# Patient Record
Sex: Female | Born: 1955
Health system: Southern US, Community
[De-identification: ages and names within clinical notes are randomized; demographics above are authoritative.]

## PROBLEM LIST (undated history)

## (undated) DIAGNOSIS — K8020 Calculus of gallbladder without cholecystitis without obstruction: Secondary | ICD-10-CM

## (undated) DIAGNOSIS — F329 Major depressive disorder, single episode, unspecified: Secondary | ICD-10-CM

## (undated) DIAGNOSIS — M2041 Other hammer toe(s) (acquired), right foot: Secondary | ICD-10-CM

## (undated) DIAGNOSIS — M2042 Other hammer toe(s) (acquired), left foot: Secondary | ICD-10-CM

## (undated) DIAGNOSIS — K219 Gastro-esophageal reflux disease without esophagitis: Secondary | ICD-10-CM

## (undated) DIAGNOSIS — K5790 Diverticulosis of intestine, part unspecified, without perforation or abscess without bleeding: Secondary | ICD-10-CM

## (undated) DIAGNOSIS — M810 Age-related osteoporosis without current pathological fracture: Secondary | ICD-10-CM

## (undated) DIAGNOSIS — M21619 Bunion of unspecified foot: Secondary | ICD-10-CM

## (undated) DIAGNOSIS — H269 Unspecified cataract: Secondary | ICD-10-CM

## (undated) DIAGNOSIS — M1990 Unspecified osteoarthritis, unspecified site: Secondary | ICD-10-CM

## (undated) DIAGNOSIS — K279 Peptic ulcer, site unspecified, unspecified as acute or chronic, without hemorrhage or perforation: Secondary | ICD-10-CM

## (undated) DIAGNOSIS — E785 Hyperlipidemia, unspecified: Secondary | ICD-10-CM

## (undated) DIAGNOSIS — K5792 Diverticulitis of intestine, part unspecified, without perforation or abscess without bleeding: Secondary | ICD-10-CM

## (undated) DIAGNOSIS — M8580 Other specified disorders of bone density and structure, unspecified site: Secondary | ICD-10-CM

## (undated) DIAGNOSIS — K589 Irritable bowel syndrome without diarrhea: Secondary | ICD-10-CM

## (undated) DIAGNOSIS — F32A Depression, unspecified: Secondary | ICD-10-CM

## (undated) DIAGNOSIS — B351 Tinea unguium: Secondary | ICD-10-CM

## (undated) DIAGNOSIS — L900 Lichen sclerosus et atrophicus: Secondary | ICD-10-CM

## (undated) DIAGNOSIS — N281 Cyst of kidney, acquired: Secondary | ICD-10-CM

## (undated) DIAGNOSIS — S838X9A Sprain of other specified parts of unspecified knee, initial encounter: Secondary | ICD-10-CM

## (undated) HISTORY — DX: Gastro-esophageal reflux disease without esophagitis: K21.9

## (undated) HISTORY — DX: Peptic ulcer, site unspecified, unspecified as acute or chronic, without hemorrhage or perforation: K27.9

## (undated) HISTORY — DX: Cyst of kidney, acquired: N28.1

## (undated) HISTORY — DX: Other hammer toe(s) (acquired), right foot: M20.41

## (undated) HISTORY — DX: Calculus of gallbladder without cholecystitis without obstruction: K80.20

## (undated) HISTORY — DX: Diverticulitis of intestine, part unspecified, without perforation or abscess without bleeding: K57.92

## (undated) HISTORY — PX: COLONOSCOPY: SHX174

## (undated) HISTORY — DX: Major depressive disorder, single episode, unspecified: F32.9

## (undated) HISTORY — DX: Age-related osteoporosis without current pathological fracture: M81.0

## (undated) HISTORY — DX: Unspecified cataract: H26.9

## (undated) HISTORY — DX: Unspecified osteoarthritis, unspecified site: M19.90

## (undated) HISTORY — DX: Tinea unguium: B35.1

## (undated) HISTORY — PX: WISDOM TOOTH EXTRACTION: SHX21

## (undated) HISTORY — DX: Diverticulosis of intestine, part unspecified, without perforation or abscess without bleeding: K57.90

## (undated) HISTORY — DX: Lichen sclerosus et atrophicus: L90.0

## (undated) HISTORY — PX: UPPER GASTROINTESTINAL ENDOSCOPY: SHX188

## (undated) HISTORY — DX: Depression, unspecified: F32.A

## (undated) HISTORY — DX: Sprain of other specified parts of unspecified knee, initial encounter: S83.8X9A

## (undated) HISTORY — DX: Other hammer toe(s) (acquired), right foot: M20.42

## (undated) HISTORY — DX: Hyperlipidemia, unspecified: E78.5

## (undated) HISTORY — DX: Irritable bowel syndrome, unspecified: K58.9

## (undated) HISTORY — DX: Other specified disorders of bone density and structure, unspecified site: M85.80

## (undated) HISTORY — DX: Bunion of unspecified foot: M21.619

---

## 1979-01-20 DIAGNOSIS — K279 Peptic ulcer, site unspecified, unspecified as acute or chronic, without hemorrhage or perforation: Secondary | ICD-10-CM

## 1979-01-20 HISTORY — DX: Peptic ulcer, site unspecified, unspecified as acute or chronic, without hemorrhage or perforation: K27.9

## 1985-05-21 HISTORY — PX: CHOLECYSTECTOMY: SHX55

## 1998-02-01 ENCOUNTER — Other Ambulatory Visit: Admission: RE | Admit: 1998-02-01 | Discharge: 1998-02-01 | Payer: Self-pay | Admitting: Obstetrics and Gynecology

## 1998-06-20 ENCOUNTER — Ambulatory Visit (HOSPITAL_COMMUNITY): Admission: RE | Admit: 1998-06-20 | Discharge: 1998-06-20 | Payer: Self-pay | Admitting: Obstetrics and Gynecology

## 1998-06-20 ENCOUNTER — Encounter: Payer: Self-pay | Admitting: Obstetrics and Gynecology

## 1999-04-26 ENCOUNTER — Other Ambulatory Visit: Admission: RE | Admit: 1999-04-26 | Discharge: 1999-04-26 | Payer: Self-pay | Admitting: *Deleted

## 1999-06-26 ENCOUNTER — Encounter: Payer: Self-pay | Admitting: Obstetrics and Gynecology

## 1999-06-26 ENCOUNTER — Ambulatory Visit (HOSPITAL_COMMUNITY): Admission: RE | Admit: 1999-06-26 | Discharge: 1999-06-26 | Payer: Self-pay | Admitting: Obstetrics and Gynecology

## 2000-05-29 ENCOUNTER — Other Ambulatory Visit: Admission: RE | Admit: 2000-05-29 | Discharge: 2000-05-29 | Payer: Self-pay | Admitting: Obstetrics and Gynecology

## 2000-07-01 ENCOUNTER — Ambulatory Visit (HOSPITAL_COMMUNITY): Admission: RE | Admit: 2000-07-01 | Discharge: 2000-07-01 | Payer: Self-pay | Admitting: Obstetrics and Gynecology

## 2000-07-01 ENCOUNTER — Encounter: Payer: Self-pay | Admitting: Obstetrics and Gynecology

## 2001-07-02 ENCOUNTER — Encounter: Payer: Self-pay | Admitting: Obstetrics and Gynecology

## 2001-07-02 ENCOUNTER — Ambulatory Visit (HOSPITAL_COMMUNITY): Admission: RE | Admit: 2001-07-02 | Discharge: 2001-07-02 | Payer: Self-pay | Admitting: Obstetrics and Gynecology

## 2001-07-09 ENCOUNTER — Other Ambulatory Visit: Admission: RE | Admit: 2001-07-09 | Discharge: 2001-07-09 | Payer: Self-pay | Admitting: Obstetrics and Gynecology

## 2002-07-08 ENCOUNTER — Ambulatory Visit (HOSPITAL_COMMUNITY): Admission: RE | Admit: 2002-07-08 | Discharge: 2002-07-08 | Payer: Self-pay | Admitting: Obstetrics and Gynecology

## 2002-07-08 ENCOUNTER — Encounter: Payer: Self-pay | Admitting: Obstetrics and Gynecology

## 2002-07-28 DIAGNOSIS — K5730 Diverticulosis of large intestine without perforation or abscess without bleeding: Secondary | ICD-10-CM | POA: Insufficient documentation

## 2002-09-18 ENCOUNTER — Other Ambulatory Visit: Admission: RE | Admit: 2002-09-18 | Discharge: 2002-09-18 | Payer: Self-pay | Admitting: Obstetrics and Gynecology

## 2003-07-14 ENCOUNTER — Ambulatory Visit (HOSPITAL_COMMUNITY): Admission: RE | Admit: 2003-07-14 | Discharge: 2003-07-14 | Payer: Self-pay | Admitting: Obstetrics and Gynecology

## 2003-11-10 ENCOUNTER — Other Ambulatory Visit: Admission: RE | Admit: 2003-11-10 | Discharge: 2003-11-10 | Payer: Self-pay | Admitting: Obstetrics and Gynecology

## 2004-07-20 ENCOUNTER — Ambulatory Visit (HOSPITAL_COMMUNITY): Admission: RE | Admit: 2004-07-20 | Discharge: 2004-07-20 | Payer: Self-pay | Admitting: Obstetrics and Gynecology

## 2004-08-14 ENCOUNTER — Ambulatory Visit: Payer: Self-pay | Admitting: Internal Medicine

## 2004-09-04 ENCOUNTER — Ambulatory Visit: Payer: Self-pay | Admitting: Internal Medicine

## 2004-09-28 ENCOUNTER — Ambulatory Visit: Payer: Self-pay | Admitting: Internal Medicine

## 2004-11-15 ENCOUNTER — Other Ambulatory Visit: Admission: RE | Admit: 2004-11-15 | Discharge: 2004-11-15 | Payer: Self-pay | Admitting: Obstetrics and Gynecology

## 2004-12-21 ENCOUNTER — Ambulatory Visit: Payer: Self-pay | Admitting: Internal Medicine

## 2005-01-02 ENCOUNTER — Ambulatory Visit: Payer: Self-pay | Admitting: Internal Medicine

## 2005-06-27 ENCOUNTER — Ambulatory Visit: Payer: Self-pay | Admitting: Internal Medicine

## 2005-07-20 ENCOUNTER — Ambulatory Visit: Payer: Self-pay

## 2005-07-30 ENCOUNTER — Ambulatory Visit (HOSPITAL_COMMUNITY): Admission: RE | Admit: 2005-07-30 | Discharge: 2005-07-30 | Payer: Self-pay | Admitting: Obstetrics and Gynecology

## 2005-08-20 ENCOUNTER — Ambulatory Visit: Payer: Self-pay | Admitting: Internal Medicine

## 2005-11-26 ENCOUNTER — Other Ambulatory Visit: Admission: RE | Admit: 2005-11-26 | Discharge: 2005-11-26 | Payer: Self-pay | Admitting: Obstetrics & Gynecology

## 2005-11-30 ENCOUNTER — Encounter: Admission: RE | Admit: 2005-11-30 | Discharge: 2005-11-30 | Payer: Self-pay | Admitting: Obstetrics & Gynecology

## 2006-03-07 ENCOUNTER — Ambulatory Visit: Payer: Self-pay | Admitting: Family Medicine

## 2006-04-08 ENCOUNTER — Ambulatory Visit: Payer: Self-pay | Admitting: Family Medicine

## 2006-06-17 ENCOUNTER — Ambulatory Visit: Payer: Self-pay | Admitting: Internal Medicine

## 2006-07-22 ENCOUNTER — Ambulatory Visit: Payer: Self-pay | Admitting: Internal Medicine

## 2006-08-15 ENCOUNTER — Ambulatory Visit (HOSPITAL_COMMUNITY): Admission: RE | Admit: 2006-08-15 | Discharge: 2006-08-15 | Payer: Self-pay | Admitting: Obstetrics and Gynecology

## 2006-12-05 ENCOUNTER — Other Ambulatory Visit: Admission: RE | Admit: 2006-12-05 | Discharge: 2006-12-05 | Payer: Self-pay | Admitting: Obstetrics and Gynecology

## 2007-01-03 ENCOUNTER — Ambulatory Visit: Payer: Self-pay | Admitting: Internal Medicine

## 2007-01-03 LAB — CONVERTED CEMR LAB
ALT: 14 units/L (ref 0–35)
AST: 20 units/L (ref 0–37)
Albumin: 4 g/dL (ref 3.5–5.2)
Alkaline Phosphatase: 103 units/L (ref 39–117)
BUN: 11 mg/dL (ref 6–23)
Basophils Absolute: 0.1 10*3/uL (ref 0.0–0.1)
Basophils Relative: 1.3 % — ABNORMAL HIGH (ref 0.0–1.0)
Bilirubin Urine: NEGATIVE
Bilirubin, Direct: 0.1 mg/dL (ref 0.0–0.3)
Blood in Urine, dipstick: NEGATIVE
CO2: 33 meq/L — ABNORMAL HIGH (ref 19–32)
Calcium: 9.4 mg/dL (ref 8.4–10.5)
Chloride: 109 meq/L (ref 96–112)
Cholesterol: 265 mg/dL (ref 0–200)
Creatinine, Ser: 0.7 mg/dL (ref 0.4–1.2)
Direct LDL: 155.8 mg/dL
Eosinophils Absolute: 0.1 10*3/uL (ref 0.0–0.6)
Eosinophils Relative: 2.9 % (ref 0.0–5.0)
GFR calc Af Amer: 113 mL/min
GFR calc non Af Amer: 94 mL/min
Glucose, Bld: 93 mg/dL (ref 70–99)
Glucose, Urine, Semiquant: NEGATIVE
HCT: 38.4 % (ref 36.0–46.0)
HDL: 80.1 mg/dL (ref >39.0)
Hemoglobin: 13.2 g/dL (ref 12.0–15.0)
Lymphocytes Relative: 41.9 % (ref 12.0–46.0)
MCHC: 34.3 g/dL (ref 30.0–36.0)
MCV: 88.8 fL (ref 78.0–100.0)
Monocytes Absolute: 0.5 10*3/uL (ref 0.2–0.7)
Monocytes Relative: 11 % (ref 3.0–11.0)
Neutro Abs: 1.9 10*3/uL (ref 1.4–7.7)
Neutrophils Relative %: 42.9 % — ABNORMAL LOW (ref 43.0–77.0)
Nitrite: NEGATIVE
Platelets: 250 10*3/uL (ref 150–400)
Potassium: 3.9 meq/L (ref 3.5–5.1)
Protein, U semiquant: NEGATIVE
RBC: 4.33 M/uL (ref 3.87–5.11)
RDW: 12.7 % (ref 11.5–14.6)
Sodium: 146 meq/L — ABNORMAL HIGH (ref 135–145)
Specific Gravity, Urine: 1.02
TSH: 1.78 microintl units/mL (ref 0.35–5.50)
Total Bilirubin: 0.8 mg/dL (ref 0.3–1.2)
Total CHOL/HDL Ratio: 3.3
Total Protein: 7.1 g/dL (ref 6.0–8.3)
Triglycerides: 67 mg/dL (ref 0–149)
Urobilinogen, UA: 0.2
VLDL: 13 mg/dL (ref 0–40)
WBC Urine, dipstick: NEGATIVE
WBC: 4.4 10*3/uL — ABNORMAL LOW (ref 4.5–10.5)
pH: 5.5

## 2007-01-08 DIAGNOSIS — F329 Major depressive disorder, single episode, unspecified: Secondary | ICD-10-CM | POA: Insufficient documentation

## 2007-01-08 DIAGNOSIS — K219 Gastro-esophageal reflux disease without esophagitis: Secondary | ICD-10-CM | POA: Insufficient documentation

## 2007-01-10 ENCOUNTER — Ambulatory Visit: Payer: Self-pay | Admitting: Internal Medicine

## 2007-01-10 DIAGNOSIS — E785 Hyperlipidemia, unspecified: Secondary | ICD-10-CM | POA: Insufficient documentation

## 2007-03-05 ENCOUNTER — Ambulatory Visit: Payer: Self-pay | Admitting: Internal Medicine

## 2007-03-05 LAB — CONVERTED CEMR LAB: Vitamin B-12: 320 pg/mL (ref 211–911)

## 2007-03-26 ENCOUNTER — Ambulatory Visit: Payer: Self-pay | Admitting: Internal Medicine

## 2007-03-26 DIAGNOSIS — J069 Acute upper respiratory infection, unspecified: Secondary | ICD-10-CM | POA: Insufficient documentation

## 2007-03-26 DIAGNOSIS — J019 Acute sinusitis, unspecified: Secondary | ICD-10-CM | POA: Insufficient documentation

## 2007-04-02 ENCOUNTER — Ambulatory Visit: Payer: Self-pay | Admitting: Internal Medicine

## 2007-04-02 ENCOUNTER — Encounter: Payer: Self-pay | Admitting: Internal Medicine

## 2007-04-23 DIAGNOSIS — K589 Irritable bowel syndrome without diarrhea: Secondary | ICD-10-CM | POA: Insufficient documentation

## 2007-05-22 LAB — HM COLONOSCOPY

## 2007-08-21 ENCOUNTER — Ambulatory Visit (HOSPITAL_COMMUNITY): Admission: RE | Admit: 2007-08-21 | Discharge: 2007-08-21 | Payer: Self-pay | Admitting: Obstetrics and Gynecology

## 2007-09-04 ENCOUNTER — Encounter: Admission: RE | Admit: 2007-09-04 | Discharge: 2007-09-04 | Payer: Self-pay | Admitting: Obstetrics and Gynecology

## 2007-11-06 ENCOUNTER — Encounter: Payer: Self-pay | Admitting: Internal Medicine

## 2008-01-03 ENCOUNTER — Ambulatory Visit: Payer: Self-pay | Admitting: Family Medicine

## 2008-01-03 DIAGNOSIS — A088 Other specified intestinal infections: Secondary | ICD-10-CM | POA: Insufficient documentation

## 2008-01-07 ENCOUNTER — Telehealth: Payer: Self-pay | Admitting: Family Medicine

## 2008-01-13 ENCOUNTER — Ambulatory Visit: Payer: Self-pay | Admitting: Internal Medicine

## 2008-01-13 DIAGNOSIS — R197 Diarrhea, unspecified: Secondary | ICD-10-CM | POA: Insufficient documentation

## 2008-01-20 ENCOUNTER — Encounter: Payer: Self-pay | Admitting: Internal Medicine

## 2008-02-05 ENCOUNTER — Ambulatory Visit: Payer: Self-pay | Admitting: Internal Medicine

## 2008-02-19 ENCOUNTER — Telehealth: Payer: Self-pay | Admitting: Internal Medicine

## 2008-02-19 ENCOUNTER — Emergency Department (HOSPITAL_COMMUNITY): Admission: EM | Admit: 2008-02-19 | Discharge: 2008-02-19 | Payer: Self-pay | Admitting: Family Medicine

## 2008-02-20 ENCOUNTER — Ambulatory Visit: Payer: Self-pay | Admitting: Internal Medicine

## 2008-02-23 ENCOUNTER — Telehealth: Payer: Self-pay | Admitting: Internal Medicine

## 2008-03-18 ENCOUNTER — Other Ambulatory Visit: Admission: RE | Admit: 2008-03-18 | Discharge: 2008-03-18 | Payer: Self-pay | Admitting: Obstetrics and Gynecology

## 2008-04-20 LAB — CONVERTED CEMR LAB: Pap Smear: NORMAL

## 2008-06-21 ENCOUNTER — Telehealth: Payer: Self-pay | Admitting: Internal Medicine

## 2008-06-21 ENCOUNTER — Ambulatory Visit: Payer: Self-pay | Admitting: Internal Medicine

## 2008-06-21 DIAGNOSIS — H9209 Otalgia, unspecified ear: Secondary | ICD-10-CM | POA: Insufficient documentation

## 2008-06-21 DIAGNOSIS — R3915 Urgency of urination: Secondary | ICD-10-CM | POA: Insufficient documentation

## 2008-06-21 LAB — CONVERTED CEMR LAB
Bilirubin Urine: NEGATIVE
Blood in Urine, dipstick: NEGATIVE
Glucose, Urine, Semiquant: NEGATIVE
Ketones, urine, test strip: NEGATIVE
Nitrite: NEGATIVE
Protein, U semiquant: NEGATIVE
Specific Gravity, Urine: 1.015
Urobilinogen, UA: 0.2
WBC Urine, dipstick: NEGATIVE
pH: 5.5

## 2008-06-22 ENCOUNTER — Encounter: Payer: Self-pay | Admitting: Internal Medicine

## 2008-10-25 ENCOUNTER — Telehealth: Payer: Self-pay | Admitting: Internal Medicine

## 2008-10-29 ENCOUNTER — Ambulatory Visit: Payer: Self-pay | Admitting: Internal Medicine

## 2008-10-29 DIAGNOSIS — M5412 Radiculopathy, cervical region: Secondary | ICD-10-CM | POA: Insufficient documentation

## 2008-10-29 DIAGNOSIS — R110 Nausea: Secondary | ICD-10-CM | POA: Insufficient documentation

## 2008-11-04 ENCOUNTER — Ambulatory Visit (HOSPITAL_COMMUNITY): Admission: RE | Admit: 2008-11-04 | Discharge: 2008-11-04 | Payer: Self-pay | Admitting: Internal Medicine

## 2008-11-08 ENCOUNTER — Ambulatory Visit (HOSPITAL_COMMUNITY): Admission: RE | Admit: 2008-11-08 | Discharge: 2008-11-08 | Payer: Self-pay | Admitting: Obstetrics and Gynecology

## 2008-11-10 ENCOUNTER — Ambulatory Visit: Payer: Self-pay | Admitting: Gastroenterology

## 2008-11-10 DIAGNOSIS — M549 Dorsalgia, unspecified: Secondary | ICD-10-CM | POA: Insufficient documentation

## 2008-11-15 ENCOUNTER — Encounter: Admission: RE | Admit: 2008-11-15 | Discharge: 2008-11-15 | Payer: Self-pay | Admitting: Obstetrics and Gynecology

## 2008-11-29 ENCOUNTER — Ambulatory Visit: Payer: Self-pay | Admitting: Internal Medicine

## 2008-11-29 LAB — CONVERTED CEMR LAB
ALT: 18 units/L (ref 0–35)
AST: 23 units/L (ref 0–37)
Albumin: 3.9 g/dL (ref 3.5–5.2)
Alkaline Phosphatase: 108 units/L (ref 39–117)
BUN: 18 mg/dL (ref 6–23)
Basophils Absolute: 0.1 10*3/uL (ref 0.0–0.1)
Basophils Relative: 1.8 % (ref 0.0–3.0)
Bilirubin Urine: NEGATIVE
Bilirubin, Direct: 0 mg/dL (ref 0.0–0.3)
Blood in Urine, dipstick: NEGATIVE
CO2: 34 meq/L — ABNORMAL HIGH (ref 19–32)
Calcium: 9.5 mg/dL (ref 8.4–10.5)
Chloride: 106 meq/L (ref 96–112)
Cholesterol: 268 mg/dL — ABNORMAL HIGH (ref 0–200)
Creatinine, Ser: 0.8 mg/dL (ref 0.4–1.2)
Direct LDL: 164.7 mg/dL
Eosinophils Absolute: 0.1 10*3/uL (ref 0.0–0.7)
Eosinophils Relative: 3.4 % (ref 0.0–5.0)
GFR calc non Af Amer: 79.48 mL/min (ref >60)
Glucose, Bld: 96 mg/dL (ref 70–99)
Glucose, Urine, Semiquant: NEGATIVE
HCT: 38.5 % (ref 36.0–46.0)
HDL: 81.3 mg/dL (ref >39.00)
Hemoglobin: 12.8 g/dL (ref 12.0–15.0)
Ketones, urine, test strip: NEGATIVE
Lymphocytes Relative: 38.1 % (ref 12.0–46.0)
Lymphs Abs: 1.6 10*3/uL (ref 0.7–4.0)
MCHC: 33.3 g/dL (ref 30.0–36.0)
MCV: 89.6 fL (ref 78.0–100.0)
Monocytes Absolute: 0.5 10*3/uL (ref 0.1–1.0)
Monocytes Relative: 10.9 % (ref 3.0–12.0)
Neutro Abs: 1.9 10*3/uL (ref 1.4–7.7)
Neutrophils Relative %: 45.8 % (ref 43.0–77.0)
Nitrite: NEGATIVE
Platelets: 241 10*3/uL (ref 150.0–400.0)
Potassium: 5.1 meq/L (ref 3.5–5.1)
Protein, U semiquant: NEGATIVE
RBC: 4.29 M/uL (ref 3.87–5.11)
RDW: 13.1 % (ref 11.5–14.6)
Sodium: 147 meq/L — ABNORMAL HIGH (ref 135–145)
Specific Gravity, Urine: 1.015
TSH: 1.6 microintl units/mL (ref 0.35–5.50)
Total Bilirubin: 0.8 mg/dL (ref 0.3–1.2)
Total CHOL/HDL Ratio: 3
Total Protein: 7.4 g/dL (ref 6.0–8.3)
Triglycerides: 88 mg/dL (ref 0.0–149.0)
Urobilinogen, UA: 0.2
VLDL: 17.6 mg/dL (ref 0.0–40.0)
WBC Urine, dipstick: NEGATIVE
WBC: 4.2 10*3/uL — ABNORMAL LOW (ref 4.5–10.5)
pH: 5.5

## 2008-12-06 ENCOUNTER — Ambulatory Visit: Payer: Self-pay | Admitting: Internal Medicine

## 2008-12-06 DIAGNOSIS — M546 Pain in thoracic spine: Secondary | ICD-10-CM | POA: Insufficient documentation

## 2008-12-06 DIAGNOSIS — I83893 Varicose veins of bilateral lower extremities with other complications: Secondary | ICD-10-CM | POA: Insufficient documentation

## 2008-12-20 ENCOUNTER — Ambulatory Visit: Payer: Self-pay | Admitting: Internal Medicine

## 2009-04-08 ENCOUNTER — Encounter: Payer: Self-pay | Admitting: Nurse Practitioner

## 2009-04-08 ENCOUNTER — Telehealth: Payer: Self-pay | Admitting: Nurse Practitioner

## 2009-07-01 ENCOUNTER — Ambulatory Visit: Payer: Self-pay | Admitting: Internal Medicine

## 2009-07-01 DIAGNOSIS — M79609 Pain in unspecified limb: Secondary | ICD-10-CM | POA: Insufficient documentation

## 2009-09-07 ENCOUNTER — Encounter: Payer: Self-pay | Admitting: Internal Medicine

## 2009-10-04 ENCOUNTER — Encounter: Payer: Self-pay | Admitting: Internal Medicine

## 2009-10-19 LAB — HM DEXA SCAN

## 2009-11-10 ENCOUNTER — Encounter: Payer: Self-pay | Admitting: Internal Medicine

## 2009-11-10 ENCOUNTER — Encounter: Admission: RE | Admit: 2009-11-10 | Discharge: 2009-11-10 | Payer: Self-pay | Admitting: Internal Medicine

## 2010-02-02 ENCOUNTER — Ambulatory Visit: Payer: Self-pay | Admitting: Internal Medicine

## 2010-02-27 ENCOUNTER — Ambulatory Visit: Payer: Self-pay | Admitting: Family Medicine

## 2010-02-27 ENCOUNTER — Telehealth: Payer: Self-pay | Admitting: Internal Medicine

## 2010-02-27 LAB — CONVERTED CEMR LAB
Bilirubin Urine: NEGATIVE
Blood in Urine, dipstick: NEGATIVE
Glucose, Urine, Semiquant: NEGATIVE
Nitrite: NEGATIVE
Specific Gravity, Urine: 1.01
Urobilinogen, UA: 0.2
WBC Urine, dipstick: NEGATIVE
pH: 6

## 2010-06-11 ENCOUNTER — Encounter: Payer: Self-pay | Admitting: Obstetrics and Gynecology

## 2010-06-12 ENCOUNTER — Encounter: Payer: Self-pay | Admitting: Obstetrics and Gynecology

## 2010-06-14 ENCOUNTER — Ambulatory Visit
Admission: RE | Admit: 2010-06-14 | Discharge: 2010-06-14 | Payer: Self-pay | Source: Home / Self Care | Attending: Family Medicine | Admitting: Family Medicine

## 2010-06-22 NOTE — Assessment & Plan Note (Signed)
Summary: R LEG PAIN // RS   Vital Signs:  Patient profile:   55 year old female Menstrual status:  postmenopausal Weight:      163 pounds Pulse rate:   78 / minute BP sitting:   110 / 80  (left arm) Cuff size:   regular  Vitals Entered By: Romualdo Bolk, CMA (AAMA) (July 01, 2009 3:39 PM) CC: Rt leg- Pt feel x 2 weeks ago and on 2/7 pt had muscle spasm with pains that when up her leg. Knee was throbbing. Pt is wanting to know if she needs to see someone about vascular issues.   History of Present Illness: [name] [name] comesin today for  above problem .  She had a fall on right knee 2 weeks ago without brusing or swelling  . a week later got pain andteria tibail area and then whole leg hurting without weakness or numbness or swelling or redness.     Hurt last weekend and  interfered with sleep.  took otc ? with help .  limits nsaid because of GI side effects.  SH has VV  stands a lot .  no ulcers. no rx . No hx of blodd clots.  Preventive Screening-Counseling & Management  Alcohol-Tobacco     Alcohol drinks/day: <1     Alcohol type: wine     Smoking Status: never  Caffeine-Diet-Exercise     Caffeine use/day: 3-4     Does Patient Exercise: yes     Type of exercise: walking     Times/week: 2  Current Medications (verified): 1)  Fluoxetine Hcl 20 Mg Caps (Fluoxetine Hcl) .... Once Daily 2)  Nexium 40 Mg Cpdr (Esomeprazole Magnesium) .... Take 1 Tab 30 Min Prior To Breakfast 3)  Vitamin D (Ergocalciferol) 50000 Unit Caps (Ergocalciferol)  Allergies (verified): No Known Drug Allergies  Past History:  Past medical, surgical, family and social histories (including risk factors) reviewed for relevance to current acute and chronic problems.  Past Medical History: Reviewed history from 11/10/2008 and no changes required. Depression GERD PUD 1980's g0p0    Past Surgical History: Reviewed history from 11/10/2008 and no changes required.  Cholecystectomy    Family History: Reviewed history from 12/06/2008 and no changes required. Family History Breast cancer 1st degree relative <50 Family History of Colon CA 1st degree relative <60  parent, father Family History of Cardiovascular disorder Fam hx RA   Father died this year  RA and pneumonia   Social History: Reviewed history from 12/06/2008 and no changes required. 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    Physical Exam  General:  Well-developed,well-nourished,in no acute distress; alert,appropriate and cooperative throughout examination Head:  normocephalic and atraumatic.   Lungs:  normal respiratory effort and no intercostal retractions.   Heart:  normal rate and regular rhythm.   Msk:  no joint swelling, no joint warmth, and no redness over joints.  midly tender above anterior knee but no crepitus   Pulses:  pulses intact without delay   Extremities:  no clubbing cyanosis or edema    prominent venous varicosities left more than  right  no ulcers or induration  and neg homan no redness  Neurologic:  alert & oriented X3, strength normal in all extremities, and gait normal.   Skin:  turgor normal, color normal, no ecchymoses, no petechiae, and no purpura.   Psych:  Oriented X3, good eye contact, not anxious appearing, and not depressed appearing.     Impression & Recommendations:  Problem # 1:  LEG PAIN, RIGHT (ICD-729.5) ? from VV    could be related  no ob  dvt signs   and knee has slight pain.   but doesnt seem to be the major problem.    rec referral  for  evaluation asa cuase of discomfort and rx intervention.  Orders: Vascular Clinic (Vascular)  Complete Medication List: 1)  Fluoxetine Hcl 20 Mg Caps (Fluoxetine hcl) .... Once daily 2)  Nexium 40 Mg Cpdr (Esomeprazole magnesium) .... Take 1 tab 30 min prior to breakfast 3)  Vitamin D (ergocalciferol) 50000 Unit Caps (Ergocalciferol)  Patient Instructions: 1)  we will contact you about  vascular referral   2)  In the meantime  tylenol and advil.  as needed.   3)  If getting swelling and progressive  pain     call for  earlier evaluation.

## 2010-06-22 NOTE — Miscellaneous (Signed)
Summary: Nexium Rx  Clinical Lists Changes  Medications: Changed medication from NEXIUM 40 MG CPDR (ESOMEPRAZOLE MAGNESIUM) Take 1 tab 30 min prior to breakfast to NEXIUM 40 MG CPDR (ESOMEPRAZOLE MAGNESIUM) Take 1 tab 30 min prior to breakfast. MUST HAVE OFFICE VISIT FOR FURTHER REFILLS! - Signed Rx of NEXIUM 40 MG CPDR (ESOMEPRAZOLE MAGNESIUM) Take 1 tab 30 min prior to breakfast. MUST HAVE OFFICE VISIT FOR FURTHER REFILLS!;  #30 x 2;  Signed;  Entered by: Hortense Ramal CMA (AAMA);  Authorized by: Hart Carwin MD;  Method used: Electronically to [facility]. #[id number]*, [address], [place], [place], [place]  [address], Ph: [phone], Fax: [phone]    Prescriptions: NEXIUM 40 MG CPDR (ESOMEPRAZOLE MAGNESIUM) Take 1 tab 30 min prior to breakfast. MUST HAVE OFFICE VISIT FOR FURTHER REFILLS!  #30 x 2   Entered by:   Hortense Ramal CMA (AAMA)   Authorized by:   Hart Carwin MD   Signed by:   Hortense Ramal CMA (AAMA) on 09/07/2009   Method used:   Electronically to        [facility]. [phone]* (retail)       [address]       [place], [place]  [address]       Ph: [phone]       Fax: [phone]   RxID:   [id number]

## 2010-06-22 NOTE — Assessment & Plan Note (Signed)
Summary: rx renewal...em    History of Present Illness Visit Type: Follow-up Visit Primary GI MD: Lina Sar MD Primary Elizjah Noblet: Berniece Andreas, MD Requesting Paulita Licklider: n/a Chief Complaint: F/u for GERD. Pt denies any GI complaints. Pt needs Nexium refilled  History of Present Illness:   This is a 55 year old white female with a history of gastroesophageal reflux who comes for refills of her Nexium 40 mg daily. Her last upper endoscopy in November 2008 showed no evidence of Barrett's esophagus. Her symptoms are controlled on Nexium 40 mg daily. She tried to switch to Prilosec recently but developed breakthrough symptoms. Her last colonoscopy in October 2009 showed mild diverticulosis of the left colon. There is a positive family history of colon cancer in her father. Her next colonoscopy will be due in October 2014.   GI Review of Systems      Denies abdominal pain, acid reflux, belching, bloating, chest pain, dysphagia with liquids, dysphagia with solids, heartburn, loss of appetite, nausea, vomiting, vomiting blood, weight loss, and  weight gain.        Denies anal fissure, black tarry stools, change in bowel habit, constipation, diarrhea, diverticulosis, fecal incontinence, heme positive stool, hemorrhoids, irritable bowel syndrome, jaundice, light color stool, liver problems, rectal bleeding, and  rectal pain.    Current Medications (verified): 1)  Fluoxetine Hcl 20 Mg Caps (Fluoxetine Hcl) .... Once Daily 2)  Nexium 40 Mg Cpdr (Esomeprazole Magnesium) .... Take 1 Tab 30 Min Prior To Breakfast. Must Have Office Visit For Further Refills! 3)  Vitamin D (Ergocalciferol) 50000 Unit Caps (Ergocalciferol) 4)  Centrum Ultra Womens  Tabs (Multiple Vitamins-Minerals) .... One Tablet By Mouth Once Daily  Allergies (verified): No Known Drug Allergies  Past History:  Past Medical History: Reviewed history from 11/10/2008 and no changes required. Depression GERD PUD 1980's g0p0     Past Surgical History: Reviewed history from 11/10/2008 and no changes required.  Cholecystectomy   Family History: Reviewed history from 12/06/2008 and no changes required. Family History Breast cancer 1st degree relative <50 Family History of Colon CA 1st degree relative <60  parent, father Family History of Cardiovascular disorder Fam hx RA   Father died this year  RA and pneumonia   Social History: Reviewed history from 12/06/2008 and no changes required. 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 Alcohol use-no Drug use-no Regular exercise-no   Daily Caffeine Use 2-3 cups per day HH of  1  and 2 cats   Review of Systems  The patient denies allergy/sinus, anemia, anxiety-new, arthritis/joint pain, back pain, blood in urine, breast changes/lumps, change in vision, confusion, cough, coughing up blood, depression-new, fainting, fatigue, fever, headaches-new, hearing problems, heart murmur, heart rhythm changes, itching, menstrual pain, muscle pains/cramps, night sweats, nosebleeds, pregnancy symptoms, shortness of breath, skin rash, sleeping problems, sore throat, swelling of feet/legs, swollen lymph glands, thirst - excessive , urination - excessive , urination changes/pain, urine leakage, vision changes, and voice change.         Pertinent positive and negative review of systems were noted in the above HPI. All other ROS was otherwise negative.   Vital Signs:  Patient profile:   55 year old female Menstrual status:  postmenopausal Height:      66 inches Weight:      162 pounds BMI:     26.24 BSA:     1.83 Pulse rate:   82 / minute Pulse rhythm:   regular BP sitting:   112 /  68  (left arm) Cuff size:   regular  Vitals Entered By: Ok Anis CMA (February 02, 2010 4:02 PM)   Impression & Recommendations:  Problem # 1:  GERD (ICD-530.81) Patient is controlled on Nexium 40 mg daily. We will refill it for one  year.  Problem # 2:  FAMILY HISTORY OF COLON CA 1ST DEGREE RELATIVE <60 (ICD-V16.0) Patient's last colonoscopy was in October 2009. A recall colonoscopy will be due in October 2014.  Patient Instructions: 1)  resume Nexium 40 mg daily. 2)  Antireflux measures. 3)  Recall colonoscopy October 2014. 4)  Copy sent to : Dr Coral Ceo 5)  The medication list was reviewed and reconciled.  All changed / newly prescribed medications were explained.  A complete medication list was provided to the patient / caregiver. Prescriptions: NEXIUM 40 MG CPDR (ESOMEPRAZOLE MAGNESIUM) Take 1 tab 30 min prior to breakfast.  #90 x 3   Entered by:   Lamona Curl CMA (AAMA)   Authorized by:   Hart Carwin MD   Signed by:   Lamona Curl CMA (AAMA) on 02/03/2010   Method used:   Electronically to        [facility]. [phone]* (retail)       [address]       [place], [place]  [address]       Ph: [phone]       Fax: [phone]   RxID:   [id number]

## 2010-06-22 NOTE — Assessment & Plan Note (Signed)
Summary: abd. pain/ssc   Vital Signs:  Patient profile:   55 year old female Menstrual status:  postmenopausal Weight:      162 pounds O2 Sat:      98 % Temp:     9.7 degrees F Pulse rate:   92 / minute BP sitting:   122 / 74  (left arm) Cuff size:   regular  Vitals Entered By: Pura Spice, RN (February 27, 2010 4:12 PM) CC: abd pain diarrhea loose watery onset 1 day ago. no apptetite.   History of Present Illness: Here for the onset last night of lower abdominal cramps, occasional  watery diarrhea, nausea without vomitting, and urgency and frequency of urinations. No fever. Appetite is okay. drinking fluids.   Allergies (verified): No Known Drug Allergies  Past History:  Past Medical History: Reviewed history from 11/10/2008 and no changes required. Depression GERD PUD 1980's g0p0    Past Surgical History: Reviewed history from 11/10/2008 and no changes required.  Cholecystectomy   Review of Systems  The patient denies anorexia, fever, weight loss, weight gain, vision loss, decreased hearing, hoarseness, chest pain, syncope, dyspnea on exertion, peripheral edema, prolonged cough, headaches, hemoptysis, melena, hematochezia, severe indigestion/heartburn, hematuria, incontinence, genital sores, muscle weakness, suspicious skin lesions, transient blindness, difficulty walking, depression, unusual weight change, abnormal bleeding, enlarged lymph nodes, angioedema, breast masses, and testicular masses.    Physical Exam  General:  Well-developed,well-nourished,in no acute distress; alert,appropriate and cooperative throughout examination Abdomen:  soft, normal bowel sounds, no distention, no masses, no guarding, no rigidity, no rebound tenderness, no abdominal hernia, no inguinal hernia, no hepatomegaly, and no splenomegaly.  Moderately tender over the pubis.    Impression & Recommendations:  Problem # 1:  GASTROENTERITIS, VIRAL, ACUTE (ICD-008.8)  Orders: UA Dipstick  w/o Micro (manual) (78469)  Complete Medication List: 1)  Fluoxetine Hcl 20 Mg Caps (Fluoxetine hcl) .... Once daily 2)  Nexium 40 Mg Cpdr (Esomeprazole magnesium) .... Take 1 tab 30 min prior to breakfast. 3)  Vitamin D (ergocalciferol) 50000 Unit Caps (Ergocalciferol) 4)  Centrum Ultra Womens Tabs (Multiple vitamins-minerals) .... One tablet by mouth once daily  Patient Instructions: 1)  Drink fluids, use Tylenol as needed for fever, use Imodium AD as needed for diarrhea or cramps.  2)  Please schedule a follow-up appointment as needed .   Laboratory Results   Urine Tests  Date/Time Received: February 27, 2010 4:49 PM Date/Time Reported: .TIME  4:49 PM   Routine Urinalysis   Color: yellow Appearance: Clear Glucose: negative   (Normal Range: Negative) Bilirubin: negative   (Normal Range: Negative) Ketone: small (15)   (Normal Range: Negative) Spec. Gravity: 1.010   (Normal Range: 1.003-1.035) Blood: negative   (Normal Range: Negative) pH: 6.0   (Normal Range: 5.0-8.0) Protein: trace   (Normal Range: Negative) Urobilinogen: 0.2   (Normal Range: 0-1) Nitrite: negative   (Normal Range: Negative) Leukocyte Esterace: negative   (Normal Range: Negative)    Comments: Pura Spice, RN  February 27, 2010 4:50 PM

## 2010-06-22 NOTE — Assessment & Plan Note (Signed)
Summary: sinus infection/dm   Vital Signs:  Patient profile:   55 year old female Menstrual status:  postmenopausal Temp:     99.2 degrees F oral BP sitting:   110 / 80  (left arm) Cuff size:   regular  Vitals Entered By: Sid Falcon LPN (June 14, 2010 5:09 PM)  History of Present Illness: Patient seen with possible sinus infection.  Illness started a little over one week ago. Mostly nasal congestion and occasional cough. Developed laryngitis symptoms several days ago. Went to urgent care on Sunday and started on Zithromax. Also started prednisone taper just yesterday. No fever. No localized facial pain. No purulent nasal secretions. patient is nonsmoker. Denies any nausea, vomiting, or diarrhea.  Mild right anterior chest wall pain with cough. No pleuritic pain, hemoptysis, or dyspnea.  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    Physical Exam  General:   Well-developed,well-nourished,in no acute distress; alert,appropriate and cooperative throughout examination Mouth:  Oral mucosa and oropharynx without lesions or exudates.  Teeth in good repair. Neck:  No deformities, masses, or tenderness noted. Lungs:  Normal respiratory effort, chest expands symmetrically. Lungs are clear to auscultation, no crackles or wheezes. Heart:  normal rate and regular rhythm.     Impression & Recommendations:  Problem # 1:  VIRAL URI (ICD-465.9) associated laryngitis. Suspect viral. Finish out prednisone. Consider plain Mucinex. Plenty of fluids. Followup p.r.n.  Complete Medication List: 1)  Fluoxetine Hcl 20 Mg Caps (Fluoxetine hcl) .... Once daily 2)  Nexium 40 Mg Cpdr (Esomeprazole magnesium) .... Take 1 tab 30 min prior to breakfast. 3)  Vitamin D (ergocalciferol) 50000 Unit Caps (Ergocalciferol) 4)  Centrum Ultra Womens Tabs (Multiple vitamins-minerals) .... One tablet by mouth once daily  Patient Instructions: 1)  Finish out prednisone. 2)  Drink lots of liquids. 3)  Consider adding plain Mucinex. 4)  Be in touch if you develop any fever, localized facial pain, or 5)  worsening cough.   Orders Added: 1)  Est. Patient Level III [40981]

## 2010-06-22 NOTE — Progress Notes (Signed)
Summary: abd. pain   Phone Note Call from Patient Call back at Home Phone [phone]   Caller: Patient Summary of Call: Pt is having lower abd pain in center. Chils, abd. pain on a pain scale of 8 and nausea. Pt states that she hasn't seen Dr. Juanda Chance for this before. Pt is wondering if this can't be from a bacterial infection that she had in the past.  Per Dr. Clent Ridges okay to come in today at 4pm. Initial call taken by: Romualdo Bolk, CMA (AAMA),  February 27, 2010 1:23 PM

## 2010-08-17 ENCOUNTER — Encounter: Payer: Self-pay | Admitting: Internal Medicine

## 2010-08-29 ENCOUNTER — Encounter: Payer: Self-pay | Admitting: Internal Medicine

## 2010-08-29 ENCOUNTER — Ambulatory Visit (INDEPENDENT_AMBULATORY_CARE_PROVIDER_SITE_OTHER): Payer: BLUE CROSS/BLUE SHIELD | Admitting: Internal Medicine

## 2010-08-29 VITALS — BP 120/80 | HR 78 | Temp 98.9°F | Wt 160.0 lb

## 2010-08-29 DIAGNOSIS — J309 Allergic rhinitis, unspecified: Secondary | ICD-10-CM

## 2010-08-29 DIAGNOSIS — R202 Paresthesia of skin: Secondary | ICD-10-CM | POA: Insufficient documentation

## 2010-08-29 DIAGNOSIS — M542 Cervicalgia: Secondary | ICD-10-CM | POA: Insufficient documentation

## 2010-08-29 DIAGNOSIS — J302 Other seasonal allergic rhinitis: Secondary | ICD-10-CM

## 2010-08-29 DIAGNOSIS — R209 Unspecified disturbances of skin sensation: Secondary | ICD-10-CM

## 2010-08-29 DIAGNOSIS — J069 Acute upper respiratory infection, unspecified: Secondary | ICD-10-CM

## 2010-08-29 MED ORDER — PREDNISONE 20 MG PO TABS: ORAL_TABLET | ORAL | Status: DC

## 2010-08-29 NOTE — Patient Instructions (Signed)
Gt x ray and will notify you of results  Take prednisone taper for possible pinched nerve. Can take  Zyrtec or claritin   For the possible allerg in the meantime. return office visit in about  2-3 weeks.

## 2010-08-29 NOTE — Progress Notes (Signed)
  Subjective:    Patient ID: [name] [name], female    DOB: 1956/04/15, 55 y.o.   MRN: [id number]  HPI  Patient comes in for an SDA appointment today because of a couple of issues over the last 3 months she's had a problem with some tightness at the base of her left occiput and her neck feels tight and occasionally gets tingling that radiates around to the front of her neck and jaw. This is usually after bending over and standing up or hyper extending her neck. There is no radiation to her arm feet no progressive pain. Treatment intervention none  This week she's also had some upper respiratory congestion that she thinks is allergy with running is itching sneezing watery eyes and coughing no fever no face pain. Has just started her over-the-counter medication.  Past Medical History  Diagnosis Date  . Depression   . GERD (gastroesophageal reflux disease)   . PUD (peptic ulcer disease) 1980's   Past Surgical History  Procedure Date  . Cholecystectomy     reports that she has never smoked. She does not have any smokeless tobacco history on file. Her alcohol and drug histories not on file. family history includes Arthritis in her father; Breast cancer in an unspecified family member; and Pneumonia in her father. No Known Allergies   Review of Systems Negative for chest pain shortness of breath wheezing fever sweats lymphadenopathy extremity weakness syncope. No unusual headache. No change in speech or vision rest as per history of present illness    Objective:   Physical Exam Physical Exam: Vital signs reviewed [id number] is a well-developed well-nourished alert cooperative  white female who appears her stated age in no acute distress. She appears very congested. HEENT: normocephalic  traumatic , Eyes: PERRL EOM's full, conjunctiva clear, Nares: paten,t no deformity  or tenderness., 2+ turbinates Ears: no deformity EAC's clear TMs with normal landmarks. Mouth: clear OP, no lesions, edema.   Moist mucous membranes. Dentition in adequate repair. NECK:  without masses, thyromegaly or bruits. She points to the left paracervical occipital attachment to the area of discomfort. I feel no mass at this area. Range of motion is adequate. CHEST/PULM:  Clear to auscultation and percussion breath sounds equal no wheeze , rales or rhonchi. No chest wall deformities or tenderness. CV: PMI is nondisplaced, S1 S2 no gallops, murmurs, rubs. Peripheral pulses are full without delay.No JVD .   Marland Kitchen Extremtities:  No clubbing cyanosis or edema, no acute joint swelling or redness no focal atrophy some varicose veins no weakness NEURO:  Oriented x3, cranial nerves 3-12 appear to be intact, no obvious focal weakness,gait within normal limits no abnormal reflexes or asymmetrical face is symmetrical SKIN: No acute rashes normal turgor, color, no bruising or petechiae. PSYCH: Oriented, good eye contact, no obvious depression anxiety, cognition and judgment appear normal.       Assessment & Plan:   Neck symptoms possible muscle tightness with tingling consider  focal nerve compression but no other alarm findings. Her symptoms are somewhat positional we'll check neck x-ray and give anti-inflammatory treatment with prednisone.  If she is not improving of followup we'll consider referral evaluation.  Upper respiratory congestion probably allergic rhinitis in season. It is possible she has a viral respiratory infection also there is no complicated. Treat for allergy in the meantime.

## 2010-08-30 ENCOUNTER — Ambulatory Visit (INDEPENDENT_AMBULATORY_CARE_PROVIDER_SITE_OTHER)
Admission: RE | Admit: 2010-08-30 | Discharge: 2010-08-30 | Disposition: A | Discharge status: Home / Self Care | Payer: BLUE CROSS/BLUE SHIELD | Source: Ambulatory Visit | Attending: Internal Medicine | Admitting: Internal Medicine

## 2010-08-30 DIAGNOSIS — M542 Cervicalgia: Secondary | ICD-10-CM

## 2010-08-30 DIAGNOSIS — R202 Paresthesia of skin: Secondary | ICD-10-CM

## 2010-08-30 DIAGNOSIS — R209 Unspecified disturbances of skin sensation: Secondary | ICD-10-CM

## 2010-08-31 ENCOUNTER — Telehealth: Payer: Self-pay | Admitting: *Deleted

## 2010-08-31 DIAGNOSIS — R9389 Abnormal findings on diagnostic imaging of other specified body structures: Secondary | ICD-10-CM

## 2010-08-31 NOTE — Telephone Encounter (Signed)
Left message to call back  

## 2010-08-31 NOTE — Telephone Encounter (Signed)
Message copied by Tor Netters on Thu Aug 31, 2010  1:38 PM ------      Message from: [facility], [place] [facility]      Created: Thu Aug 31, 2010  1:17 PM       Advise patient she has arthritis  Degenerative disease In the neck       Also the radiologist suggested chest exam caus of / haziness on right lung area.      Please have her get a chest x ray   For this reason.

## 2010-09-04 NOTE — Telephone Encounter (Signed)
Left message to call back  

## 2010-09-04 NOTE — Telephone Encounter (Signed)
Pt aware of this and order placed for cxr.

## 2010-09-08 ENCOUNTER — Ambulatory Visit (INDEPENDENT_AMBULATORY_CARE_PROVIDER_SITE_OTHER)
Admission: RE | Admit: 2010-09-08 | Discharge: 2010-09-08 | Disposition: A | Discharge status: Home / Self Care | Payer: BLUE CROSS/BLUE SHIELD | Source: Ambulatory Visit | Attending: Internal Medicine | Admitting: Internal Medicine

## 2010-09-08 DIAGNOSIS — R9389 Abnormal findings on diagnostic imaging of other specified body structures: Secondary | ICD-10-CM

## 2010-09-13 ENCOUNTER — Telehealth: Payer: Self-pay | Admitting: Internal Medicine

## 2010-09-13 DIAGNOSIS — J984 Other disorders of lung: Secondary | ICD-10-CM

## 2010-09-13 NOTE — Telephone Encounter (Signed)
Pt called to check on xr results. Pls call asap.

## 2010-09-14 NOTE — Telephone Encounter (Signed)
Pt aware of results and wants to go ahead with CT. Order sent to [facility].

## 2010-09-14 NOTE — Telephone Encounter (Signed)
Tell patient that chest x-ray shows changes of COPD but that is just a radiologic change there is some scarring at the top of her lung which could be old infection. The radiologist recommends a chest CT to make sure there is nothing but scarring.    Please arrange for a chest CT with contrast if possible.  Of note this x-ray did not come to my desk top  for review.  It is fortunate that the patient called to get this result.  Please send this information to the electronic record team to make sure this doesn't happen again

## 2010-09-20 ENCOUNTER — Ambulatory Visit: Payer: BLUE CROSS/BLUE SHIELD | Admitting: Internal Medicine

## 2010-09-21 ENCOUNTER — Ambulatory Visit (INDEPENDENT_AMBULATORY_CARE_PROVIDER_SITE_OTHER)
Admission: RE | Admit: 2010-09-21 | Discharge: 2010-09-21 | Disposition: A | Discharge status: Home / Self Care | Payer: BLUE CROSS/BLUE SHIELD | Source: Ambulatory Visit | Attending: Internal Medicine | Admitting: Internal Medicine

## 2010-09-21 DIAGNOSIS — J984 Other disorders of lung: Secondary | ICD-10-CM

## 2010-09-21 MED ORDER — IOHEXOL 300 MG/ML  SOLN
80.0000 mL | Freq: Once | INTRAMUSCULAR | Status: AC | PRN
  Administered 2010-09-21: 80 mL via INTRAVENOUS

## 2010-09-22 NOTE — Telephone Encounter (Signed)
Pt aware of results 

## 2010-09-27 ENCOUNTER — Encounter: Payer: Self-pay | Admitting: Internal Medicine

## 2010-09-27 ENCOUNTER — Ambulatory Visit (INDEPENDENT_AMBULATORY_CARE_PROVIDER_SITE_OTHER): Payer: BLUE CROSS/BLUE SHIELD | Admitting: Internal Medicine

## 2010-09-27 VITALS — BP 120/80 | HR 78 | Temp 99.3°F | Wt 164.0 lb

## 2010-09-27 DIAGNOSIS — R202 Paresthesia of skin: Secondary | ICD-10-CM

## 2010-09-27 DIAGNOSIS — R209 Unspecified disturbances of skin sensation: Secondary | ICD-10-CM

## 2010-09-27 DIAGNOSIS — R059 Cough, unspecified: Secondary | ICD-10-CM

## 2010-09-27 DIAGNOSIS — M542 Cervicalgia: Secondary | ICD-10-CM

## 2010-09-27 DIAGNOSIS — J309 Allergic rhinitis, unspecified: Secondary | ICD-10-CM

## 2010-09-27 DIAGNOSIS — R05 Cough: Secondary | ICD-10-CM | POA: Insufficient documentation

## 2010-09-27 DIAGNOSIS — J302 Other seasonal allergic rhinitis: Secondary | ICD-10-CM

## 2010-09-27 NOTE — Assessment & Plan Note (Signed)
X-ray shows degenerative disease

## 2010-09-27 NOTE — Patient Instructions (Signed)
Will get your PFTS   Scheduled  And then plan follow .  Consider further evaluation continued numbness face etc Agree TMJ could add to these symptoms. Your neurologic exam is good today. IF needed we can have the pulmonologist  Consult.  Otherwise call if neck and  Tingling pain  persistent or progressive

## 2010-09-27 NOTE — Progress Notes (Signed)
  Subjective:    Patient ID: [name] [name], female    DOB: 12/14/1955, 55 y.o.   MRN: [id number]  HPI Patient comes in today for followup of a number of issues. She was seen last time for neck upper back pain with radiation felt to be cervical radiculopathy. At that time an x-ray x-ray showed degenerative disease and a question of a mass effect in the upper apical area of her long therefore chest x-ray was done and then a chest CT chest CT was normal except for apical scarring. There was no unusual masses or adenopathy.  Since then she had finished a prednisone taper  and her neck pain radiating is much better. She still has some upper thorax pain at times that she thinks is radiating from the GI tract. She had a time with radiating tingling in her right mid face that she woke up with one morning. She does have TM increased her teeth at night sometimes.  She's had no vision change hearing change falling motor weakness speech difficulties or dysphagia that is new. No unusual rashes.   Cough upper respiratory symptoms are much better but are occasionally good days and bad days. She has never been a smoker but was around tobacco smoke when she was growing up. She has a remote history of having had pulmonary function tests done that were apparently normal not in the last 5 years of electronic records. J   Review of Systems See history of present illness   Past history family history social history reviewed in the electronic medical record.     Objective:   Physical Exam Well-developed well-nourished in no acute distress  HEENT: Normocephalic ;atraumatic , Eyes;  PERRL, EOMs  Full, lids and conjunctiva clear,,Ears: no deformities, canals nl, TM landmarks normal, Nose: no deformity or discharge mild congestion.  Mouth : OP clear without lesion or edema . Neck: no adenopathy.  No bruits.   Tight traps non tender.  Chest:  Clear to A&P without wheezes rales or rhonchi CV:  S1-S2 no gallops or  murmurs peripheral perfusion is normal Neuro : CN 3-12 intact symmetrical facies  DTRs;  symmetrical gait nl no weakness   Meg rhomberg and tandem walk . Skin: no acute rashes      Reviewed scans and x rays with patient  Total visit > 50% spent counseling and coordinating care      Assessment & Plan:  Neck pain better after pred. Cough off and on ? Allergic     NO alarm features  Chest x ray ? Copd: old apical scarring.  No hx of pneumonia or tb.   Recent tingling right jaw face area without motor sx when awoke ? related to teeth clenching tmj or so  If  persistent or progressive  Can get neuro to see her.   Options discussed   . Plan PFTS.

## 2010-10-03 NOTE — Assessment & Plan Note (Signed)
[name] [name]                         GASTROENTEROLOGY OFFICE NOTE   NAME:[name], [name] HUDSON                     MRN:          [id number]  DATE:03/05/2007                            DOB:          [birth year]    [name] [name] is a very nice 54 year old white female who has chronic  epigastric discomfort.  She has a history of cholecystectomy and bile  reflux gastritis, documented on upper endoscopy in 1997.  She also has a  history of gastroesophageal reflux, for which she has been treated with  proton pump inhibitor.  Most recently, she has been on Nexium 40 mg a  day, which was started and completely relieved her GI symptoms.  Unfortunately, her insurance company co-pay has gone up to $50 a month,  and they have indicated that they would prefer the use of generic drugs.  She has purchased Prilosec 20 mg over-the-counter, which she has taken  once a day, recently with recurrence of the epigastric pain and burning.  She denies any gastroesophageal reflux at this point.  Her pain is  anterior, usually post prandial but could be also occurring in  relationship to meals.  Her weight has been stable.  She denies early  satiety.   MEDICATIONS:  1. Prozac 20 mg daily.  2. Vitamin D.  3. Prilosec 20 mg daily OTC.   PHYSICAL EXAMINATION:  Blood pressure 108/68, pulse 68, weight 162  pounds.  She was alert and oriented in no distress.  LUNGS:  Clear to auscultation.  COR:  Normal S1 and S2.  ABDOMEN:  Soft but tender in the midline above the umbilicus and also to  the left of the midline.  There were no abnormal pulsations.  Lower  abdomen was normal.  BREAST:  Not done.   IMPRESSION:  81. A 55 year old white female with recurrence of epigastric      discomfort, who had bowel gastritis to rule out known acid      dyspepsia.  2. Gastroesophageal reflux disease by history.  3. Positive family history of colon cancer in her father.  Last      colonoscopy in  March, 2004.  Next colonoscopy would be March, 2009.  4. History of irritable bowel syndrome.   PLAN:  1. Switch to Prilosec OTC 40 mg daily, as recommended by her insurance      carrier, [employer].  If it does not work, we will      switch back to Nexium with a higher co-pay      of $50 a month.  2. I think she ought to have a repeat endoscopy to rebiopsy for H.      pylori.     Hedwig Morton. Juanda Chance, MD  Electronically Signed    DMB/MedQ  DD: 03/05/2007  DT: 03/05/2007  Job #: [id number]   cc:   [name]. Fabian Sharp, MD

## 2010-10-27 ENCOUNTER — Ambulatory Visit (INDEPENDENT_AMBULATORY_CARE_PROVIDER_SITE_OTHER): Payer: BLUE CROSS/BLUE SHIELD | Admitting: Internal Medicine

## 2010-10-27 DIAGNOSIS — R059 Cough, unspecified: Secondary | ICD-10-CM

## 2010-10-27 DIAGNOSIS — R05 Cough: Secondary | ICD-10-CM

## 2010-10-27 LAB — PULMONARY FUNCTION TEST

## 2010-10-27 NOTE — Progress Notes (Signed)
PFT done today. 

## 2010-11-10 ENCOUNTER — Telehealth: Payer: Self-pay | Admitting: *Deleted

## 2010-11-10 NOTE — Telephone Encounter (Signed)
Pt aware that MD is out of the office. But we will call her as soon as we get the results in.

## 2010-11-11 NOTE — Telephone Encounter (Signed)
This message doesn't make sense what results are we talking about. Please clarify this message on my desk.

## 2010-11-13 NOTE — Telephone Encounter (Signed)
Pt wants PFT results

## 2010-11-13 NOTE — Telephone Encounter (Signed)
I dont know how to find these  . I looked at every tab.  Please  See if you can find results    I cant answer the message if I dont see the results .Marland KitchenMarland Kitchen

## 2010-11-14 ENCOUNTER — Encounter: Payer: Self-pay | Admitting: Internal Medicine

## 2010-11-14 NOTE — Telephone Encounter (Signed)
I called pulmonary to get the pft's results that were done on 10/27/10. Medical records is going to call us back to let us know what is going on.

## 2010-11-15 NOTE — Telephone Encounter (Signed)
Pt aware of results 

## 2010-11-15 NOTE — Telephone Encounter (Signed)
Per Dr. Fabian Sharp- PFT's are normal. Left message to call back.

## 2010-11-15 NOTE — Progress Notes (Signed)
Pt aware of results 

## 2010-11-30 ENCOUNTER — Other Ambulatory Visit: Payer: Self-pay | Admitting: Obstetrics and Gynecology

## 2010-11-30 DIAGNOSIS — Z1231 Encounter for screening mammogram for malignant neoplasm of breast: Secondary | ICD-10-CM

## 2010-12-01 ENCOUNTER — Encounter: Payer: Self-pay | Admitting: Internal Medicine

## 2010-12-01 ENCOUNTER — Ambulatory Visit (INDEPENDENT_AMBULATORY_CARE_PROVIDER_SITE_OTHER): Payer: BLUE CROSS/BLUE SHIELD | Admitting: Internal Medicine

## 2010-12-01 ENCOUNTER — Ambulatory Visit (INDEPENDENT_AMBULATORY_CARE_PROVIDER_SITE_OTHER)
Admission: RE | Admit: 2010-12-01 | Discharge: 2010-12-01 | Disposition: A | Discharge status: Home / Self Care | Payer: BC Managed Care – PPO | Source: Ambulatory Visit | Attending: Internal Medicine | Admitting: Internal Medicine

## 2010-12-01 ENCOUNTER — Telehealth: Payer: Self-pay | Admitting: *Deleted

## 2010-12-01 VITALS — BP 120/80 | HR 66 | Temp 98.3°F | Wt 163.0 lb

## 2010-12-01 DIAGNOSIS — N281 Cyst of kidney, acquired: Secondary | ICD-10-CM

## 2010-12-01 DIAGNOSIS — S6990XA Unspecified injury of unspecified wrist, hand and finger(s), initial encounter: Secondary | ICD-10-CM

## 2010-12-01 DIAGNOSIS — S59919A Unspecified injury of unspecified forearm, initial encounter: Secondary | ICD-10-CM

## 2010-12-01 DIAGNOSIS — IMO0002 Reserved for concepts with insufficient information to code with codable children: Secondary | ICD-10-CM

## 2010-12-01 DIAGNOSIS — Q619 Cystic kidney disease, unspecified: Secondary | ICD-10-CM

## 2010-12-01 DIAGNOSIS — S0993XA Unspecified injury of face, initial encounter: Secondary | ICD-10-CM

## 2010-12-01 DIAGNOSIS — S6992XA Unspecified injury of left wrist, hand and finger(s), initial encounter: Secondary | ICD-10-CM

## 2010-12-01 DIAGNOSIS — S59909A Unspecified injury of unspecified elbow, initial encounter: Secondary | ICD-10-CM

## 2010-12-01 DIAGNOSIS — W07XXXA Fall from chair, initial encounter: Secondary | ICD-10-CM

## 2010-12-01 DIAGNOSIS — S0081XA Abrasion of other part of head, initial encounter: Secondary | ICD-10-CM

## 2010-12-01 DIAGNOSIS — S199XXA Unspecified injury of neck, initial encounter: Secondary | ICD-10-CM

## 2010-12-01 NOTE — Telephone Encounter (Signed)
Message copied by Romualdo Bolk on Fri Dec 01, 2010  4:06 PM ------      Message from: [facility], [place] K      Created: Fri Dec 01, 2010  4:03 PM       Tell patient that her face x-ray and wrist x-ray are negative for fracture.

## 2010-12-01 NOTE — Patient Instructions (Addendum)
Use ice packs tonight on both areas of concern. Keep abrasion clean and covered antibiotic ointment. Seek urgent advice if having increasing headache nausea vomiting vision change or balance issues. X-rays today/ we'll contact you about these results.  Expect improvement and resolution within a week . Call for fu of  persistent or progressive

## 2010-12-01 NOTE — Telephone Encounter (Signed)
Left message to call back  

## 2010-12-01 NOTE — Progress Notes (Signed)
  Subjective:    Patient ID: [name] [name], female    DOB: 27-Nov-1955, 55 y.o.   MRN: [id number]  HPI Patient comes in today for an acute visit for an acute injury. Standing on stool  Reaching ceiling  Working on a leak.   when went tried to go back up. Lost her balance and An fell on left face on hardwood floors. She fell pretty hard but had no loss of consciousness change in balance had some bleeding on her left temple. Then she noticed her left wrist was tender and somewhat swollen but had good range of motion. There is no numbness tingling syncope dizziness unusual headache except at the site of injury. She was by herself when this happened.   Time of injury about 3 hours ago. Review of Systems No numbness chest pain shortness of breath otherwise as per history of present illness Asks about the review of the tests done a while back with a cyst on the kidney.  Past history family history social history reviewed in the electronic medical record.     Objective:   Physical Exam WDWN in nad  Head left temoral brow area with 3 mm skin avulsion abrasion with swelling and minimal eccymosis . EOMS intact bony tenderness zygoma and cheek EAC nl tms clear  Lm Op clear without lsion  Tongue midline .  Neck supple without masses Neuro: oriented  Gait nl cn 3-12 seem intact  Slight facial assymetry Nl balance no weakness or tremor. Reflexes symmetrical.  Ext : Left wrist with normal range of motion and grip. There is some mild swelling and bruising on the lateral ulnar side dorsal wrist but no bony tenderness no obvious instability neurovascular is intact.       Assessment & Plan:  History of a fall from a stool Closed head injury with facial bruising small abrasion rule out fracture Left wrist injury check x-ray today good motion no dysfunction at present  No obvious signs of concussion discussed risk of monitoring symptoms seek care if gets worse     otherwise get her x-ray today local  care and follow.  Will do record review about the renal cyst.  After patient left. US done 6 / 10 and had 5 cm left renal cyst.   Will notify her and plan repeat ultrasound.

## 2010-12-04 NOTE — Telephone Encounter (Signed)
Pt aware of results 

## 2010-12-11 ENCOUNTER — Ambulatory Visit
Admission: RE | Admit: 2010-12-11 | Discharge: 2010-12-11 | Disposition: A | Discharge status: Home / Self Care | Payer: BC Managed Care – PPO | Source: Ambulatory Visit | Attending: Internal Medicine | Admitting: Internal Medicine

## 2010-12-11 DIAGNOSIS — N281 Cyst of kidney, acquired: Secondary | ICD-10-CM

## 2010-12-12 ENCOUNTER — Telehealth: Payer: Self-pay | Admitting: *Deleted

## 2010-12-12 DIAGNOSIS — N281 Cyst of kidney, acquired: Secondary | ICD-10-CM

## 2010-12-12 NOTE — Telephone Encounter (Signed)
Left message for pt to call back  °

## 2010-12-12 NOTE — Telephone Encounter (Signed)
Message copied by Romualdo Bolk on Tue Dec 12, 2010 10:47 AM ------      Message from: [facility], [place] K      Created: Mon Dec 11, 2010  5:33 PM       Tell patient that cyst still looks  Like a simple cyst without worrisome features however it has grown and is rather large .   THus I would have [place] get urology opinion about this.

## 2010-12-15 ENCOUNTER — Ambulatory Visit
Admission: RE | Admit: 2010-12-15 | Discharge: 2010-12-15 | Disposition: A | Discharge status: Home / Self Care | Payer: BC Managed Care – PPO | Source: Ambulatory Visit | Attending: Obstetrics and Gynecology | Admitting: Obstetrics and Gynecology

## 2010-12-15 DIAGNOSIS — Z1231 Encounter for screening mammogram for malignant neoplasm of breast: Secondary | ICD-10-CM

## 2010-12-18 NOTE — Telephone Encounter (Signed)
Pt aware of results and wants to go ahead with referral.

## 2010-12-18 NOTE — Telephone Encounter (Signed)
Left message to call back  

## 2011-04-26 ENCOUNTER — Ambulatory Visit: Payer: BC Managed Care – PPO | Admitting: Internal Medicine

## 2011-04-30 ENCOUNTER — Ambulatory Visit (INDEPENDENT_AMBULATORY_CARE_PROVIDER_SITE_OTHER)
Admission: RE | Admit: 2011-04-30 | Discharge: 2011-04-30 | Disposition: A | Discharge status: Home / Self Care | Payer: BC Managed Care – PPO | Source: Ambulatory Visit | Attending: Internal Medicine | Admitting: Internal Medicine

## 2011-04-30 ENCOUNTER — Encounter: Payer: Self-pay | Admitting: Internal Medicine

## 2011-04-30 ENCOUNTER — Ambulatory Visit (INDEPENDENT_AMBULATORY_CARE_PROVIDER_SITE_OTHER): Payer: BC Managed Care – PPO | Admitting: Internal Medicine

## 2011-04-30 VITALS — BP 120/80 | HR 78 | Temp 98.7°F | Wt 162.0 lb

## 2011-04-30 DIAGNOSIS — R05 Cough: Secondary | ICD-10-CM

## 2011-04-30 DIAGNOSIS — J309 Allergic rhinitis, unspecified: Secondary | ICD-10-CM

## 2011-04-30 DIAGNOSIS — R059 Cough, unspecified: Secondary | ICD-10-CM

## 2011-04-30 DIAGNOSIS — J302 Other seasonal allergic rhinitis: Secondary | ICD-10-CM

## 2011-04-30 DIAGNOSIS — J019 Acute sinusitis, unspecified: Secondary | ICD-10-CM | POA: Insufficient documentation

## 2011-04-30 MED ORDER — AMOXICILLIN-POT CLAVULANATE 875-125 MG PO TABS: 1.0000 | ORAL_TABLET | Freq: Two times a day (BID) | ORAL | Status: AC

## 2011-04-30 MED ORDER — PREDNISONE 20 MG PO TABS: ORAL_TABLET | ORAL | Status: AC

## 2011-04-30 NOTE — Patient Instructions (Signed)
Get chest x ray Let you know results. Treating for sinusitis.   With antibiotic and prednisone. Call or seek care  if  persistent or progressive or alarm symptoms as discussed. Should be getting better in 5 days or less.  FU if this is not happeneing.

## 2011-04-30 NOTE — Progress Notes (Signed)
  Subjective:    Patient ID: [name] [name], female    DOB: March 04, 1956, 55 y.o.   MRN: [id number]  HPI   Patient comes in today for SDA  For acute problem evaluation. However she has been seen elsewhere for part of this problem. Moved to [place] into mother's previous residence who is now in assisted living and went to  urgent care  After 1 day of chills chills. And queasiness.  Cough off an on since October. and was given tamiflu . However still has problems with coughing. She is only taking atenolol to once a day. And has ur congestion and cough continuing. No associated fever and chills at the present time Has had some sinus or face pressure yesterday and some today on the left taking her regular medicine for this problem over the counter Some sneezing and itching.   Says the onset of the cough is coincident with the time she moved to her mom's house. Review of Systems Cough ing  since October. Some phlegm at times possible wheezing at times  No hx of asthma  As a child .  Moved in her  House. Mid October.  No chest pain or shortness of breath but did have some upper back symptoms last week that she sought me a bit of pulled muscle. That is now better  No syncope vision changes itching. Didn't have a flu shot this season . Influenza in the community  Past history family history social history reviewed in the electronic medical record.\    Objective:   Physical Exam WDWN in nad  HEENT: Normocephalic looks congested in no acute distress ears EACs clear TMs clear nares congested face slightly tender in the left maxillary area. Eyes PERRLA EOMs full conjunctiva clear OP: No edema lesions airway is clear Neck: Supple without adenopathy or masses or bruits Chest:  Clear to A&P without wheezes rales or rhonchi  she does have a deep honking type cough. CV:  S1-S2 no gallops or murmurs peripheral perfusion is normal      Assessment & Plan:  Cough off and on for 2 months appears to  predate the respiratory infection. Consider allergic /reactive phenomenon recommend chest x-ray and followup as appropriate for this.  Recent respiratory infection treated for flu although she's only taking Tamiflu once a day doubt this is the diagnosis. She does however have a persistent facial congestion and pain consistent with a possible sinusitis.   Discussed this with her get chest x-ray today begin antibiotic for sinusitis and 5 days of prednisone. Followup depending on how she is doing in her chest x-ray report.

## 2011-05-01 NOTE — Progress Notes (Signed)
Quick Note:  Left message to call back. ______ 

## 2011-05-02 NOTE — Progress Notes (Signed)
Quick Note:  Pt aware of results. Pt did started on the Augmentin and will start on prednisone tonight. ______

## 2011-05-02 NOTE — Progress Notes (Signed)
Quick Note:  Left message to call back. ______ 

## 2011-05-25 ENCOUNTER — Ambulatory Visit: Payer: BC Managed Care – PPO | Admitting: Internal Medicine

## 2011-05-28 ENCOUNTER — Ambulatory Visit (INDEPENDENT_AMBULATORY_CARE_PROVIDER_SITE_OTHER): Payer: BC Managed Care – PPO | Admitting: Internal Medicine

## 2011-05-28 ENCOUNTER — Encounter: Payer: Self-pay | Admitting: Internal Medicine

## 2011-05-28 ENCOUNTER — Ambulatory Visit: Payer: BC Managed Care – PPO | Admitting: Family

## 2011-05-28 VITALS — BP 110/80 | HR 75 | Temp 98.2°F | Wt 164.0 lb

## 2011-05-28 DIAGNOSIS — R0789 Other chest pain: Secondary | ICD-10-CM | POA: Insufficient documentation

## 2011-05-28 DIAGNOSIS — R059 Cough, unspecified: Secondary | ICD-10-CM

## 2011-05-28 DIAGNOSIS — R05 Cough: Secondary | ICD-10-CM

## 2011-05-28 MED ORDER — BUDESONIDE-FORMOTEROL FUMARATE 160-4.5 MCG/ACT IN AERO: 2.0000 | INHALATION_SPRAY | Freq: Two times a day (BID) | RESPIRATORY_TRACT | Status: DC

## 2011-05-28 NOTE — Progress Notes (Signed)
  Subjective:    Patient ID: [name] [name], female    DOB: Jul 27, 1955, 56 y.o.   MRN: [id number]  HPI Patient comes in today for SDA  But actually a f/u of problem, with cough  Seen last month. See last notes in December 2012  Had c xray nad but "emphysema"  Since that time after antibiotic and prednisone.  80 % improvement but never totally better  Rattling cough and white phelgm . Sinus not as bad but can get upper congestion .  Today mid chest tightness.   PFTs ok last spring.  Chest ct showed apical scarring  No mass or acute findings .   Review of Systems Middle scapular  area      Pain poss from lifting mom  Hx of ger sx   No nvd no rashes  Past history family history social history reviewed in the electronic medical record.     Objective:   Physical Exam wdwn on nad ocass cough  Looks well HEENT : grossly normal  Neck: Supple without adenopathy or masses or bruits Chest:  Clear to A&P without wheezes rales or rhonchi CV:  S1-S2 no gallops or murmurs peripheral perfusion is normal Abdomen:  Sof,t normal bowel sounds without hepatosplenomegaly, no guarding rebound or masses no CVA tenderness No cp tenderness Reviewed last cxray and ct and pfts     Assessment & Plan:  prolonged cough ing and mid chest sx .    Samples of symbicort  Today  Stop any medicated cough  Drops   Change to  Sugar free drops.  Will arrange  Pulmonary consult

## 2011-05-28 NOTE — Patient Instructions (Addendum)
Begin symbicort 2 puffs twice a day   To see if helps your breathing .  Rinse out your mouth after use.   Will contact you about pulmonary consult referral

## 2011-06-06 ENCOUNTER — Institutional Professional Consult (permissible substitution): Payer: BC Managed Care – PPO | Admitting: Pulmonary Disease

## 2011-06-18 ENCOUNTER — Encounter: Payer: Self-pay | Admitting: Pulmonary Disease

## 2011-06-18 ENCOUNTER — Ambulatory Visit (INDEPENDENT_AMBULATORY_CARE_PROVIDER_SITE_OTHER): Payer: BC Managed Care – PPO | Admitting: Pulmonary Disease

## 2011-06-18 VITALS — BP 118/82 | HR 72 | Temp 98.3°F | Ht 66.0 in | Wt 162.4 lb

## 2011-06-18 DIAGNOSIS — R059 Cough, unspecified: Secondary | ICD-10-CM

## 2011-06-18 DIAGNOSIS — R05 Cough: Secondary | ICD-10-CM

## 2011-06-18 NOTE — Progress Notes (Signed)
  Subjective:    Patient ID: [name] [name], female    DOB: November 13, 1955, 56 y.o.   MRN: [id number]  HPI 55/F , never smoker for evaluation of chronic cough. She repors long standing GERD since 14 yrs requiring PPI therapy. She reports seasonal allergies requiring OTC medications. She developed a cough with some hoarseness in the spring that resolved. This surfaced again in the fall after a sinus infection that was treated with antibiotics and prednisone. She is now 80 % improved & reports cough with overexertion & some hoarseness. She denies wheezing or nocturnal heartburn or tickle in her throat. PFTs 6/12 did not show airway obstruction. CXR showed hyperinflation & Chest ct showed apical scarring . She was given symbicort but did not start taking it.    Review of Systems Constitutional: negative for anorexia, fevers and sweats  Eyes: negative for irritation, redness and visual disturbance  Ears, nose, mouth, throat, and face: negative for earaches, epistaxis, nasal congestion and sore throat  Respiratory: negative for cough, dyspnea on exertion, sputum and wheezing  Cardiovascular: negative for chest pain, dyspnea, lower extremity edema, orthopnea, palpitations and syncope  Gastrointestinal: negative for abdominal pain, constipation, diarrhea, melena, nausea and vomiting  Genitourinary:negative for dysuria, frequency and hematuria  Hematologic/lymphatic: negative for bleeding, easy bruising and lymphadenopathy  Musculoskeletal:negative for arthralgias, muscle weakness and stiff joints  Neurological: negative for coordination problems, gait problems, headaches and weakness  Endocrine: negative for diabetic symptoms including polydipsia, polyuria and weight loss     Objective:   Physical Exam  Gen. Pleasant, well-nourished, in no distress, normal affect ENT - no lesions, no post nasal drip Neck: No JVD, no thyromegaly, no carotid bruits Lungs: no use of accessory muscles, no dullness  to percussion, clear without rales or rhonchi  Cardiovascular: Rhythm regular, heart sounds  normal, no murmurs or gallops, no peripheral edema Abdomen: soft and non-tender, no hepatosplenomegaly, BS normal. Musculoskeletal: No deformities, no cyanosis or clubbing Neuro:  alert, non focal       Assessment & Plan:

## 2011-06-18 NOTE — Assessment & Plan Note (Signed)
-   likely post bronchitic & reflux may be contributing Stay on prilosec daily Call us if worse - do not feel more testing is indicated at this time Take DELSYM 2 tsp thrice daily if cough returns She has 'hyperinflation' on  Xray - this is not due to COPD or asthma

## 2011-06-18 NOTE — Patient Instructions (Signed)
Your cough is likely post bronchitic & reflux may be contributing Stay on prilosec daily Call us if worse - do not feel more testing is indicated at this time Take DELSYM 2 tsp thrice daily if cough returns You have 'hyperinflation' on  Your xray You DO NOT have COPD or asthma

## 2011-11-09 ENCOUNTER — Other Ambulatory Visit: Payer: Self-pay | Admitting: Obstetrics and Gynecology

## 2011-11-09 DIAGNOSIS — Z1231 Encounter for screening mammogram for malignant neoplasm of breast: Secondary | ICD-10-CM

## 2011-12-10 ENCOUNTER — Ambulatory Visit (INDEPENDENT_AMBULATORY_CARE_PROVIDER_SITE_OTHER): Payer: BC Managed Care – PPO

## 2011-12-10 ENCOUNTER — Other Ambulatory Visit: Payer: Self-pay | Admitting: Orthopedic Surgery

## 2011-12-10 DIAGNOSIS — M47812 Spondylosis without myelopathy or radiculopathy, cervical region: Secondary | ICD-10-CM

## 2011-12-10 DIAGNOSIS — R52 Pain, unspecified: Secondary | ICD-10-CM

## 2011-12-13 ENCOUNTER — Ambulatory Visit: Payer: BC Managed Care – PPO | Attending: Orthopedic Surgery | Admitting: Physical Therapy

## 2011-12-13 DIAGNOSIS — M542 Cervicalgia: Secondary | ICD-10-CM | POA: Insufficient documentation

## 2011-12-13 DIAGNOSIS — IMO0001 Reserved for inherently not codable concepts without codable children: Secondary | ICD-10-CM | POA: Insufficient documentation

## 2011-12-13 DIAGNOSIS — M6281 Muscle weakness (generalized): Secondary | ICD-10-CM | POA: Insufficient documentation

## 2011-12-13 DIAGNOSIS — M2550 Pain in unspecified joint: Secondary | ICD-10-CM | POA: Insufficient documentation

## 2011-12-17 ENCOUNTER — Ambulatory Visit (HOSPITAL_BASED_OUTPATIENT_CLINIC_OR_DEPARTMENT_OTHER)
Admission: RE | Admit: 2011-12-17 | Discharge: 2011-12-17 | Disposition: A | Discharge status: Home / Self Care | Payer: BC Managed Care – PPO | Source: Ambulatory Visit | Attending: Obstetrics and Gynecology | Admitting: Obstetrics and Gynecology

## 2011-12-17 DIAGNOSIS — Z1231 Encounter for screening mammogram for malignant neoplasm of breast: Secondary | ICD-10-CM | POA: Insufficient documentation

## 2011-12-18 ENCOUNTER — Encounter: Payer: BC Managed Care – PPO | Admitting: Physical Therapy

## 2011-12-20 ENCOUNTER — Ambulatory Visit: Payer: BC Managed Care – PPO | Attending: Orthopedic Surgery | Admitting: Physical Therapy

## 2011-12-20 DIAGNOSIS — M542 Cervicalgia: Secondary | ICD-10-CM | POA: Insufficient documentation

## 2011-12-20 DIAGNOSIS — M6281 Muscle weakness (generalized): Secondary | ICD-10-CM | POA: Insufficient documentation

## 2011-12-20 DIAGNOSIS — M2550 Pain in unspecified joint: Secondary | ICD-10-CM | POA: Insufficient documentation

## 2011-12-20 DIAGNOSIS — IMO0001 Reserved for inherently not codable concepts without codable children: Secondary | ICD-10-CM | POA: Insufficient documentation

## 2011-12-24 ENCOUNTER — Ambulatory Visit: Payer: BC Managed Care – PPO | Admitting: Physical Therapy

## 2011-12-27 ENCOUNTER — Ambulatory Visit: Payer: BC Managed Care – PPO | Admitting: Physical Therapy

## 2012-01-03 ENCOUNTER — Ambulatory Visit: Payer: BC Managed Care – PPO | Admitting: Physical Therapy

## 2012-07-19 HISTORY — PX: IRRIGATION AND DEBRIDEMENT SEBACEOUS CYST: SHX5255

## 2012-11-04 ENCOUNTER — Encounter: Payer: Self-pay | Admitting: Internal Medicine

## 2012-11-04 ENCOUNTER — Ambulatory Visit (INDEPENDENT_AMBULATORY_CARE_PROVIDER_SITE_OTHER): Payer: BC Managed Care – PPO | Admitting: Internal Medicine

## 2012-11-04 VITALS — BP 120/72 | HR 78 | Temp 98.4°F | Wt 167.0 lb

## 2012-11-04 DIAGNOSIS — J988 Other specified respiratory disorders: Secondary | ICD-10-CM

## 2012-11-04 DIAGNOSIS — J329 Chronic sinusitis, unspecified: Secondary | ICD-10-CM | POA: Insufficient documentation

## 2012-11-04 DIAGNOSIS — J22 Unspecified acute lower respiratory infection: Secondary | ICD-10-CM

## 2012-11-04 MED ORDER — AMOXICILLIN 500 MG PO CAPS: 500.0000 mg | ORAL_CAPSULE | Freq: Three times a day (TID) | ORAL | Status: DC

## 2012-11-04 NOTE — Progress Notes (Signed)
Chief Complaint  Patient presents with  . Cough    Cough and nasal congestion are yellow.  Started taking Mucinex yesterday.  Taking every 24 hours.  . Nasal Congestion  . Headache  . Otalgia    HPI: Patient comes in today for SDA for  new problem evaluation. Last ov was 1 13  Onset about  3-4 days ago onset with cough and now ur congestion.    No fever.   Bad nasal congestion aching around eyes previous.  Took mucinex  ? No help  Saw pulmomary for cough and abn on x ray no asthma or copd found. ROS: See pertinent positives and negatives per HPI.  Past Medical History  Diagnosis Date  . Depression   . GERD (gastroesophageal reflux disease)   . PUD (peptic ulcer disease) 1980's    Family History  Problem Relation Age of Onset  . Arthritis Father     RA  . Pneumonia Father     deceased  . Breast cancer Sister   . Colon cancer Father   . Heart attack Father     History   Social History  . Marital Status: Divorced    Spouse Name: N/A    Number of Children: N/A  . Years of Education: N/A   Social History Main Topics  . Smoking status: Never Smoker   . Smokeless tobacco: None  . Alcohol Use: Yes     Comment: occasionally  . Drug Use: No  . Sexually Active: None   Other Topics Concern  . None   Social History Narrative   Manufacturing engineer.   Non smoker    Hh of 1 ... 2 cats    Single   G0P0      Mom is now in assisted living and she has moved into her old residence    Outpatient Encounter Prescriptions as of 11/04/2012  Medication Sig Dispense Refill  . ergocalciferol (VITAMIN D2) 50000 UNITS capsule Take 50,000 Units by mouth every 30 (thirty) days. 1 a month      . FLUoxetine (PROZAC) 20 MG capsule Take 20 mg by mouth daily.        . Multiple Vitamins-Minerals (CENTRUM ULTRA WOMENS PO) Take by mouth.        Marland Kitchen omeprazole (PRILOSEC) 20 MG capsule Take 20 mg by mouth daily.        Marland Kitchen amoxicillin (AMOXIL) 500 MG  capsule Take 1 capsule (500 mg total) by mouth 3 (three) times daily. For sinusitus  30 capsule  0   No facility-administered encounter medications on file as of 11/04/2012.    EXAM:  BP 120/72  Pulse 78  Temp(Src) 98.4 F (36.9 C) (Oral)  Wt 167 lb (75.751 kg)  BMI 26.97 kg/m2  SpO2 98%  Body mass index is 26.97 kg/(m^2).  WDWN in NAD  quiet respirations; very  congested  somewhat hoarse. Non toxic . HEENT: Normocephalic ;atraumatic , Eyes;  PERRL, EOMs  Full, lids and conjunctiva clear,,Ears: no deformities, canals nl, TM landmarks normal, Nose: no deformity mucoid discharge  Very  congested;face minimally tender Mouth : OP clear without lesion or edema . Neck: Supple without adenopathy or masses or bruits Chest:  Clear to A&P without wheezes rales or rhonchi CV:  S1-S2 no gallops or murmurs peripheral perfusion is normal Skin :nl perfusion and no acute rashes  MS: moves all extremities without noticeable focal  abnormality PSYCH: pleasant and cooperative, no obvious depression or anxiety  ASSESSMENT AND PLAN:  Discussed the following assessment and plan:  Sinusitis  Acute respiratory infection  -Patient advised to return or notify health care team  if symptoms worsen or persist or new concerns arise.  Patient Instructions  This acts like a viral respiratory infection involving the sinuses. Try  Sudafed again    During the day for the face pressure   .   If not improving in another  5-7 days or increasing face pain   Then can begin antibiotic for possible bacterial sinus infection. Your cough may get worse before it gets better  And can last for weeks. Contact us if fever  Or relapsing sx.   INSTRUCTIONS FOR UPPER RESPIRATORY INFECTION:  -plenty of rest and fluids  -nasal saline wash 2-3 times daily (use prepackaged nasal saline or bottled/distilled water if making your own)  -clean nose with nasal saline before using  nasal steroid or sinex  -can use sinex nasal  spray for drainage and nasal congestion - but do NOT use longer then 3-4 days overuses can cause rebound problems . Can alternate nostrils. -can use tylenol or ibuprofen as directed for aches and sorethroat  -in the winter time, using a humidifier at night is helpful (please follow cleaning instructions)  -if you are taking a cough medication - use only as directed, may also try a teaspoon of honey to coat the throat and throat lozenges for short term use. -for sore throat, salt water gargles can help  -follow up if you have fevers, facial pain, tooth pain, difficulty breathing or are worsening or not getting better in 5-7 days      Sinusitis Sinusitis is redness, soreness, and swelling (inflammation) of the paranasal sinuses. Paranasal sinuses are air pockets within the bones of your face (beneath the eyes, the middle of the forehead, or above the eyes). In healthy paranasal sinuses, mucus is able to drain out, and air is able to circulate through them by way of your nose. However, when your paranasal sinuses are inflamed, mucus and air can become trapped. This can allow bacteria and other germs to grow and cause infection. Sinusitis can develop quickly and last only a short time (acute) or continue over a long period (chronic). Sinusitis that lasts for more than 12 weeks is considered chronic.  CAUSES  Causes of sinusitis include:  Allergies.  Structural abnormalities, such as displacement of the cartilage that separates your nostrils (deviated septum), which can decrease the air flow through your nose and sinuses and affect sinus drainage.  Functional abnormalities, such as when the small hairs (cilia) that line your sinuses and help remove mucus do not work properly or are not present. SYMPTOMS  Symptoms of acute and chronic sinusitis are the same. The primary symptoms are pain and pressure around the affected sinuses. Other symptoms include:  Upper  toothache.  Earache.  Headache.  Bad breath.  Decreased sense of smell and taste.  A cough, which worsens when you are lying flat.  Fatigue.  Fever.  Thick drainage from your nose, which often is green and may contain pus (purulent).  Swelling and warmth over the affected sinuses. DIAGNOSIS  Your caregiver will perform a physical exam. During the exam, your caregiver may:  Look in your nose for signs of abnormal growths in your nostrils (nasal polyps).  Tap over the affected sinus to check for signs of infection.  View the inside of your sinuses (endoscopy) with a special imaging device with a light attached (endoscope),  which is inserted into your sinuses. If your caregiver suspects that you have chronic sinusitis, one or more of the following tests may be recommended:  Allergy tests.  Nasal culture A sample of mucus is taken from your nose and sent to a lab and screened for bacteria.  Nasal cytology A sample of mucus is taken from your nose and examined by your caregiver to determine if your sinusitis is related to an allergy. TREATMENT  Most cases of acute sinusitis are related to a viral infection and will resolve on their own within 10 days. Sometimes medicines are prescribed to help relieve symptoms (pain medicine, decongestants, nasal steroid sprays, or saline sprays).  However, for sinusitis related to a bacterial infection, your caregiver will prescribe antibiotic medicines. These are medicines that will help kill the bacteria causing the infection.  Rarely, sinusitis is caused by a fungal infection. In theses cases, your caregiver will prescribe antifungal medicine. For some cases of chronic sinusitis, surgery is needed. Generally, these are cases in which sinusitis recurs more than 3 times per year, despite other treatments. HOME CARE INSTRUCTIONS   Drink plenty of water. Water helps thin the mucus so your sinuses can drain more easily.  Use a  humidifier.  Inhale steam 3 to 4 times a day (for example, sit in the bathroom with the shower running).  Apply a warm, moist washcloth to your face 3 to 4 times a day, or as directed by your caregiver.  Use saline nasal sprays to help moisten and clean your sinuses.  Take over-the-counter or prescription medicines for pain, discomfort, or fever only as directed by your caregiver. SEEK IMMEDIATE MEDICAL CARE IF:  You have increasing pain or severe headaches.  You have nausea, vomiting, or drowsiness.  You have swelling around your face.  You have vision problems.  You have a stiff neck.  You have difficulty breathing. MAKE SURE YOU:   Understand these instructions.  Will watch your condition.  Will get help right away if you are not doing well or get worse. Document Released: 05/07/2005 Document Revised: 07/30/2011 Document Reviewed: 05/22/2011 [facility] Patient Information 2014 [place], [place].      [name]. Krishay Faro M.D.

## 2012-11-04 NOTE — Patient Instructions (Addendum)
This acts like a viral respiratory infection involving the sinuses. Try  Sudafed again    During the day for the face pressure   .   If not improving in another  5-7 days or increasing face pain   Then can begin antibiotic for possible bacterial sinus infection. Your cough may get worse before it gets better  And can last for weeks. Contact us if fever  Or relapsing sx.   INSTRUCTIONS FOR UPPER RESPIRATORY INFECTION:  -plenty of rest and fluids  -nasal saline wash 2-3 times daily (use prepackaged nasal saline or bottled/distilled water if making your own)  -clean nose with nasal saline before using  nasal steroid or sinex  -can use sinex nasal spray for drainage and nasal congestion - but do NOT use longer then 3-4 days overuses can cause rebound problems . Can alternate nostrils. -can use tylenol or ibuprofen as directed for aches and sorethroat  -in the winter time, using a humidifier at night is helpful (please follow cleaning instructions)  -if you are taking a cough medication - use only as directed, may also try a teaspoon of honey to coat the throat and throat lozenges for short term use. -for sore throat, salt water gargles can help  -follow up if you have fevers, facial pain, tooth pain, difficulty breathing or are worsening or not getting better in 5-7 days      Sinusitis Sinusitis is redness, soreness, and swelling (inflammation) of the paranasal sinuses. Paranasal sinuses are air pockets within the bones of your face (beneath the eyes, the middle of the forehead, or above the eyes). In healthy paranasal sinuses, mucus is able to drain out, and air is able to circulate through them by way of your nose. However, when your paranasal sinuses are inflamed, mucus and air can become trapped. This can allow bacteria and other germs to grow and cause infection. Sinusitis can develop quickly and last only a short time (acute) or continue over a long period (chronic). Sinusitis that lasts for  more than 12 weeks is considered chronic.  CAUSES  Causes of sinusitis include:  Allergies.  Structural abnormalities, such as displacement of the cartilage that separates your nostrils (deviated septum), which can decrease the air flow through your nose and sinuses and affect sinus drainage.  Functional abnormalities, such as when the small hairs (cilia) that line your sinuses and help remove mucus do not work properly or are not present. SYMPTOMS  Symptoms of acute and chronic sinusitis are the same. The primary symptoms are pain and pressure around the affected sinuses. Other symptoms include:  Upper toothache.  Earache.  Headache.  Bad breath.  Decreased sense of smell and taste.  A cough, which worsens when you are lying flat.  Fatigue.  Fever.  Thick drainage from your nose, which often is green and may contain pus (purulent).  Swelling and warmth over the affected sinuses. DIAGNOSIS  Your caregiver will perform a physical exam. During the exam, your caregiver may:  Look in your nose for signs of abnormal growths in your nostrils (nasal polyps).  Tap over the affected sinus to check for signs of infection.  View the inside of your sinuses (endoscopy) with a special imaging device with a light attached (endoscope), which is inserted into your sinuses. If your caregiver suspects that you have chronic sinusitis, one or more of the following tests may be recommended:  Allergy tests.  Nasal culture A sample of mucus is taken from your nose and  sent to a lab and screened for bacteria.  Nasal cytology A sample of mucus is taken from your nose and examined by your caregiver to determine if your sinusitis is related to an allergy. TREATMENT  Most cases of acute sinusitis are related to a viral infection and will resolve on their own within 10 days. Sometimes medicines are prescribed to help relieve symptoms (pain medicine, decongestants, nasal steroid sprays, or saline  sprays).  However, for sinusitis related to a bacterial infection, your caregiver will prescribe antibiotic medicines. These are medicines that will help kill the bacteria causing the infection.  Rarely, sinusitis is caused by a fungal infection. In theses cases, your caregiver will prescribe antifungal medicine. For some cases of chronic sinusitis, surgery is needed. Generally, these are cases in which sinusitis recurs more than 3 times per year, despite other treatments. HOME CARE INSTRUCTIONS   Drink plenty of water. Water helps thin the mucus so your sinuses can drain more easily.  Use a humidifier.  Inhale steam 3 to 4 times a day (for example, sit in the bathroom with the shower running).  Apply a warm, moist washcloth to your face 3 to 4 times a day, or as directed by your caregiver.  Use saline nasal sprays to help moisten and clean your sinuses.  Take over-the-counter or prescription medicines for pain, discomfort, or fever only as directed by your caregiver. SEEK IMMEDIATE MEDICAL CARE IF:  You have increasing pain or severe headaches.  You have nausea, vomiting, or drowsiness.  You have swelling around your face.  You have vision problems.  You have a stiff neck.  You have difficulty breathing. MAKE SURE YOU:   Understand these instructions.  Will watch your condition.  Will get help right away if you are not doing well or get worse. Document Released: 05/07/2005 Document Revised: 07/30/2011 Document Reviewed: 05/22/2011 [facility] Patient Information 2014 [place], [place].

## 2012-11-14 ENCOUNTER — Encounter: Payer: Self-pay | Admitting: *Deleted

## 2012-11-20 ENCOUNTER — Ambulatory Visit: Payer: Self-pay | Admitting: Nurse Practitioner

## 2012-11-20 ENCOUNTER — Other Ambulatory Visit: Payer: Self-pay | Admitting: Nurse Practitioner

## 2012-11-20 DIAGNOSIS — Z1231 Encounter for screening mammogram for malignant neoplasm of breast: Secondary | ICD-10-CM

## 2012-11-25 ENCOUNTER — Encounter: Payer: Self-pay | Admitting: Nurse Practitioner

## 2012-11-25 ENCOUNTER — Ambulatory Visit (INDEPENDENT_AMBULATORY_CARE_PROVIDER_SITE_OTHER): Payer: BC Managed Care – PPO | Admitting: Nurse Practitioner

## 2012-11-25 VITALS — BP 106/70 | HR 74 | Resp 12

## 2012-11-25 DIAGNOSIS — Z0000 Encounter for general adult medical examination without abnormal findings: Secondary | ICD-10-CM

## 2012-11-25 DIAGNOSIS — M899 Disorder of bone, unspecified: Secondary | ICD-10-CM

## 2012-11-25 DIAGNOSIS — M8580 Other specified disorders of bone density and structure, unspecified site: Secondary | ICD-10-CM

## 2012-11-25 DIAGNOSIS — Z01419 Encounter for gynecological examination (general) (routine) without abnormal findings: Secondary | ICD-10-CM

## 2012-11-25 DIAGNOSIS — E559 Vitamin D deficiency, unspecified: Secondary | ICD-10-CM

## 2012-11-25 LAB — POCT URINALYSIS DIPSTICK
Leukocytes, UA: NEGATIVE
Spec Grav, UA: 1.02
Urobilinogen, UA: NEGATIVE
pH, UA: 6

## 2012-11-25 LAB — HEMOGLOBIN, FINGERSTICK: Hemoglobin, fingerstick: 13.2 g/dL (ref 12.0–16.0)

## 2012-11-25 MED ORDER — ERGOCALCIFEROL 1.25 MG (50000 UT) PO CAPS: 50000.0000 [IU] | ORAL_CAPSULE | ORAL | Status: DC

## 2012-11-25 NOTE — Patient Instructions (Addendum)

## 2012-11-25 NOTE — Progress Notes (Signed)
57 y.o. G0P0 Divorced Caucasian Fe here for annual exam.  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 Not dating or sexually active.  No LMP recorded. Patient is postmenopausal.          Sexually active: no  The current method of family planning is post menopausal status.    Exercising: yes  walking  Smoker:  no  Health Maintenance: Pap:  09/10/2011  Normal with negative HR HPV MMG:  12/17/2011 apt 12/17/12 Colonoscopy:  2009 diverticulosis recheck due this summer BMD:   10/2009  T Score: spine -1.8;  left femur neck -0.9  TDaP:  Within last 5 years, per pt (@ PCP).  Labs: Hgb- 13.2 (PCP is checking chol, etc) we check vit D   reports that she has never smoked. She has never used smokeless tobacco. She reports that she does not drink alcohol or use illicit drugs.  Past Medical History  Diagnosis Date  . Depression   . GERD (gastroesophageal reflux disease)   . PUD (peptic ulcer disease) 1980's  . Renal cyst   . Hyperlipidemia   . Diverticulosis     Past Surgical History  Procedure Laterality Date  . Cholecystectomy      Current Outpatient Prescriptions  Medication Sig Dispense Refill  . ergocalciferol (VITAMIN D2) 50000 UNITS capsule Take 50,000 Units by mouth every 30 (thirty) days. 1 a month      . FLUoxetine (PROZAC) 20 MG capsule Take 20 mg by mouth daily.        Marland Kitchen ibuprofen (ADVIL,MOTRIN) 100 MG chewable tablet Chew 100 mg by mouth every 8 (eight) hours as needed for fever.      . Multiple Vitamins-Minerals (CENTRUM ULTRA WOMENS PO) Take by mouth.        Marland Kitchen omeprazole (PRILOSEC) 20 MG capsule Take 20 mg by mouth daily.         No current facility-administered medications for this visit.    Family History  Problem Relation Age of Onset  . Arthritis Father     RA  . Pneumonia Father     deceased  . Colon cancer Father   . Heart attack Father   . Breast cancer Sister   . Breast cancer Paternal Grandmother     ROS:  Pertinent  items are noted in HPI.  Otherwise, a comprehensive ROS was negative.  Exam:   BP 106/70  Pulse 74  Resp 12    Ht Readings from Last 3 Encounters:  06/18/11 5\' 6"  (1.676 m)  02/02/10 5\' 6"  (1.676 m)  12/06/08 5\' 6"  (1.676 m)    General appearance: alert, cooperative and appears stated age Head: Normocephalic, without obvious abnormality, atraumatic Neck: no adenopathy, supple, symmetrical, trachea midline and thyroid normal to inspection and palpation Lungs: clear to auscultation bilaterally Breasts: normal appearance, no masses or tenderness Heart: regular rate and rhythm Abdomen: soft, non-tender; no masses,  no organomegaly Extremities: extremities normal, atraumatic, no cyanosis or edema Skin: Skin color, texture, turgor normal. No rashes or lesions Lymph nodes: Cervical, supraclavicular, and axillary nodes normal. No abnormal inguinal nodes palpated Neurologic: Grossly normal   Pelvic: External genitalia:  no lesions              Urethra:  atrophic appearing urethra with no masses, tenderness or lesions              Bartholin's and Skene's: normal  Vagina: atrophic appearing vagina with pale color and discharge, no lesions              Cervix: anteverted              Pap taken: no Bimanual Exam:  Uterus:  normal size, contour, position, consistency, mobility, non-tender              Adnexa: no mass, fullness, tenderness               Rectovaginal: Confirms               Anus:  normal sphincter tone, no lesions  A:  Well Woman with normal exam  Postmenopausal no HRT  Vit D deficiency  Osteopenia  History of situational depression and is followed by PCP  History of renal cyst with normal BP and renal studies  P:   Pap smear as per guidelines   Mammogram scheduled 12/17/12  Will get BMD this summer order placed in Epic  Refill vit D and follow with labs  counseled on breast self exam, osteoporosis, adequate intake of calcium and  vitamin D, diet and  exercise, Kegel's exercises return annually or prn  An After Visit Summary was printed and given to the patient.

## 2012-11-26 LAB — VITAMIN D 25 HYDROXY (VIT D DEFICIENCY, FRACTURES): Vit D, 25-Hydroxy: 66 ng/mL (ref 30–89)

## 2012-11-27 ENCOUNTER — Encounter: Payer: Self-pay | Admitting: Internal Medicine

## 2012-11-27 ENCOUNTER — Telehealth: Payer: Self-pay | Admitting: *Deleted

## 2012-11-27 NOTE — Telephone Encounter (Signed)
Pt is aware of Vitamin D lab results and is agreeable to 600-800 IU po qd.

## 2012-11-27 NOTE — Telephone Encounter (Signed)
Message copied by Osie Bond on Thu Nov 27, 2012  3:55 PM ------      Message from: Ria Comment R      Created: Thu Nov 27, 2012  8:42 AM       Let patient know results and follow Vit D protocol ------

## 2012-12-01 NOTE — Progress Notes (Signed)
Encounter reviewed by Dr. Nikka Hakimian Silva.  

## 2012-12-17 ENCOUNTER — Ambulatory Visit (HOSPITAL_BASED_OUTPATIENT_CLINIC_OR_DEPARTMENT_OTHER)
Admission: RE | Admit: 2012-12-17 | Discharge: 2012-12-17 | Disposition: A | Discharge status: Home / Self Care | Payer: BC Managed Care – PPO | Source: Ambulatory Visit | Attending: Nurse Practitioner | Admitting: Nurse Practitioner

## 2012-12-17 DIAGNOSIS — Z1231 Encounter for screening mammogram for malignant neoplasm of breast: Secondary | ICD-10-CM | POA: Insufficient documentation

## 2012-12-30 ENCOUNTER — Ambulatory Visit (INDEPENDENT_AMBULATORY_CARE_PROVIDER_SITE_OTHER): Payer: BC Managed Care – PPO | Admitting: Family Medicine

## 2012-12-30 ENCOUNTER — Encounter: Payer: Self-pay | Admitting: Family Medicine

## 2012-12-30 VITALS — BP 124/80 | Temp 98.9°F | Wt 169.0 lb

## 2012-12-30 DIAGNOSIS — M549 Dorsalgia, unspecified: Secondary | ICD-10-CM

## 2012-12-30 NOTE — Progress Notes (Signed)
Chief Complaint  Patient presents with  . Back Pain    pulled muscle that has moved down leg     HPI:  [name] [name] is a 57 yo patient of Dr. Fabian [name] here for acute visit for "pulled muscle": -started about 10 days ago - after packing moving -symptoms: L sided mild to mod tender low back pain, heat helped a little, but has moved into L glutes and occ in upper LLE -better with heat and tylenol -worse with certain positions -intermittently chronic low back pain -denies: fevers, malaise, weakness or numbness in legs, bowel or bladder incontinence, dysuria   ROS: See pertinent positives and negatives per HPI.  Past Medical History  Diagnosis Date  . Depression   . GERD (gastroesophageal reflux disease)   . PUD (peptic ulcer disease) 1980's  . Renal cyst   . Hyperlipidemia   . Diverticulosis     Family History  Problem Relation Age of Onset  . Arthritis Father     RA  . Pneumonia Father     deceased  . Colon cancer Father 42  . Heart attack Father   . Breast cancer Sister 50  . Breast cancer Paternal Grandmother 56  . Osteoarthritis Mother   . COPD Mother   . Pneumonia Mother   . Cancer Maternal Grandmother     ? GI  . Heart failure Maternal Grandfather   . Heart failure Paternal Grandfather     History   Social History  . Marital Status: Divorced    Spouse Name: N/A    Number of Children: N/A  . Years of Education: N/A   Social History Main Topics  . Smoking status: Never Smoker   . Smokeless tobacco: Never Used  . Alcohol Use: No     Comment: occasionally  . Drug Use: No  . Sexually Active: No   Other Topics Concern  . None   Social History Narrative   Manufacturing engineer.   Non smoker    Hh of 1 ... 2 cats    Single   G0P0      Mom is now in assisted living and she has moved into her old residence    Current outpatient prescriptions:ergocalciferol (VITAMIN D2) 50000 UNITS capsule, Take 1 capsule  (50,000 Units total) by mouth every 30 (thirty) days. 1 a month, Disp: 30 capsule, Rfl: 2;  FLUoxetine (PROZAC) 20 MG capsule, Take 20 mg by mouth daily.  , Disp: , Rfl: ;  ibuprofen (ADVIL,MOTRIN) 100 MG chewable tablet, Chew 100 mg by mouth every 8 (eight) hours as needed for fever., Disp: , Rfl:  Multiple Vitamins-Minerals (CENTRUM ULTRA WOMENS PO), Take by mouth.  , Disp: , Rfl: ;  omeprazole (PRILOSEC) 20 MG capsule, Take 20 mg by mouth daily.  , Disp: , Rfl:   EXAM:  Filed Vitals:   12/30/12 1406  BP: 124/80  Temp: 98.9 F (37.2 C)    Body mass index is 27.29 kg/(m^2).  GENERAL: vitals reviewed and listed above, alert, oriented, appears well hydrated and in no acute distress  HEENT: atraumatic, conjunttiva clear, no obvious abnormalities on inspection of external nose and ears  CV: HRRR, no peripheral edema  MS: moves all extremities without noticeable abnormality Normal Gait Normal inspection of back, no obvious scoliosis or leg length descrepancy No bony TTP Soft tissue TTP at: L glutes -/+ tests: neg trendelenburg,-facet loading, -SLRT, -CLRT, -FABER, -FADIR Normal muscle strength, sensation to light touch and DTRs  in LEs bilaterally  PSYCH: pleasant and cooperative, no obvious depression or anxiety  ASSESSMENT AND PLAN:  Discussed the following assessment and plan:  Back pain  -benign exam, hx and exam c/w with lumbar and gluteal strain; less likely radicular pain - explained with noraml neuro exam and mild symptoms tx the same -supportive care and HEP -follow up in 3-4 weeks -Patient advised to return or notify a doctor immediately if symptoms worsen or persist or new concerns arise.  Patient Instructions  -heat for 15 minutes twice daily  -tylenol 500-1000mg  up to 3 times daily or ibuprofen 400 mg up to 2 times daily  -exercises provided  -follow up with your doctor in 3-4 weeks      [name] [name] R.

## 2012-12-30 NOTE — Patient Instructions (Signed)
-  heat for 15 minutes twice daily  -tylenol 500-1000mg  up to 3 times daily or ibuprofen 400 mg up to 2 times daily  -exercises provided  -follow up with your doctor in 3-4 weeks

## 2013-02-02 ENCOUNTER — Encounter: Payer: Self-pay | Admitting: Internal Medicine

## 2013-02-02 ENCOUNTER — Ambulatory Visit (INDEPENDENT_AMBULATORY_CARE_PROVIDER_SITE_OTHER): Payer: BC Managed Care – PPO | Admitting: Internal Medicine

## 2013-02-02 VITALS — BP 124/54 | HR 80 | Temp 98.2°F | Wt 169.0 lb

## 2013-02-02 DIAGNOSIS — M79609 Pain in unspecified limb: Secondary | ICD-10-CM

## 2013-02-02 DIAGNOSIS — R269 Unspecified abnormalities of gait and mobility: Secondary | ICD-10-CM | POA: Insufficient documentation

## 2013-02-02 DIAGNOSIS — M79606 Pain in leg, unspecified: Secondary | ICD-10-CM | POA: Insufficient documentation

## 2013-02-02 NOTE — Patient Instructions (Signed)
Continue back hygiene .  If the problem  Is  persistent or progressive over the next month then contact us for further evaluation . Poss sport medicine or rheumatologist.     Otherwise   ROV  At you check.

## 2013-02-02 NOTE — Progress Notes (Signed)
Chief Complaint  Patient presents with  . Follow-up    On back pain.  Now having leg pain.    HPI: Patient comes in today for follow up of  multiple medical problems.  Back pain better and then limping with legs and back of legs feels pulls tight.  See last ov.  Doing stretcing exercising  Some help. Limping. Onset  Early august .   Pushes mom in [facility] did hit shin once but no other injury . Taking fluioxetine  Hwlping.   Taking every day  Prilosec.  No fever hand arthritis father with RA . No bowel habit changes  ROS: See pertinent positives and negatives per HPI.has vv no sig ache end of day Mom is had osteoporosis fell broke her hip and she and her sister or wheeling around in wheelchair.  Past Medical History  Diagnosis Date  . Depression   . GERD (gastroesophageal reflux disease)   . PUD (peptic ulcer disease) 1980's  . Renal cyst   . Hyperlipidemia   . Diverticulosis     Family History  Problem Relation Age of Onset  . Arthritis Father     RA  . Pneumonia Father     deceased  . Colon cancer Father 36  . Heart attack Father   . Breast cancer Sister 30  . Breast cancer Paternal Grandmother 38  . Osteoarthritis Mother   . COPD Mother   . Pneumonia Mother   . Cancer Maternal Grandmother     ? GI  . Heart failure Maternal Grandfather   . Heart failure Paternal Grandfather   . Osteoporosis Mother     History   Social History  . Marital Status: Divorced    Spouse Name: N/A    Number of Children: N/A  . Years of Education: N/A   Social History Main Topics  . Smoking status: Never Smoker   . Smokeless tobacco: Never Used  . Alcohol Use: No     Comment: occasionally  . Drug Use: No  . Sexual Activity: No   Other Topics Concern  . None   Social History Narrative   Manufacturing engineer.   Non smoker    Hh of 1 ... 2 cats    Single   G0P0   Mom is now in assisted living and she has moved into her old residence     Outpatient Encounter Prescriptions as of 02/02/2013  Medication Sig Dispense Refill  . ergocalciferol (VITAMIN D2) 50000 UNITS capsule Take 1 capsule (50,000 Units total) by mouth every 30 (thirty) days. 1 a month  30 capsule  2  . FLUoxetine (PROZAC) 20 MG capsule Take 20 mg by mouth daily.        . Multiple Vitamins-Minerals (CENTRUM ULTRA WOMENS PO) Take by mouth.        Marland Kitchen omeprazole (PRILOSEC) 20 MG capsule Take 20 mg by mouth daily.        Marland Kitchen ibuprofen (ADVIL,MOTRIN) 100 MG chewable tablet Chew 100 mg by mouth every 8 (eight) hours as needed for fever.       No facility-administered encounter medications on file as of 02/02/2013.    EXAM:  BP 124/54  Pulse 80  Temp(Src) 98.2 F (36.8 C) (Oral)  Wt 169 lb (76.658 kg)  BMI 27.29 kg/m2  SpO2 96%  Body mass index is 27.29 kg/(m^2).  GENERAL: vitals reviewed and listed above, alert, oriented, appears well hydrated and in no acute distress  HEENT: atraumatic,  conjunctiva  clear, no obvious abnormalities on inspection of external nose and ears  NECK: no obvious masses on inspection palpation MS: moves all extremities without noticeable focal  Abnormality vv le   back non tender  Calf no swelling or ulcerations  Pulsed intact  Neuro exam non focal  PSYCH: pleasant and cooperative, no obvious depression or anxiety  ASSESSMENT AND PLAN:  Discussed the following assessment and plan:  Musculoskeletal leg pain, unspecified laterality - am stiffness   getting better back is better    fam hx of RA  dis otpinos continue exercises good shoe support refer if per or progressive.   Gait abnormality - am limping right ? favord non alarming exam.  back pain better Atypical symptoms consider inflammatory disease but could be overuse joint based. No alarm features otherwise discussed options we'll observe and wait another month continue exercises injury avoidance contact us Avoid sandals and use shoe support.  Continue fluoxetine followup  at next preventive checkup. -Patient advised to return or notify health care team  if symptoms worsen or persist or new concerns arise.  Patient Instructions  Continue back hygiene .  If the problem  Is  persistent or progressive over the next month then contact us for further evaluation . Poss sport medicine or rheumatologist.     Otherwise   ROV  At you check.    [name]. [name] M.D.

## 2013-02-06 ENCOUNTER — Ambulatory Visit (AMBULATORY_SURGERY_CENTER): Payer: Self-pay | Admitting: *Deleted

## 2013-02-06 VITALS — Ht 65.0 in | Wt 169.0 lb

## 2013-02-06 DIAGNOSIS — Z800 Family history of malignant neoplasm of digestive organs: Secondary | ICD-10-CM

## 2013-02-06 MED ORDER — PEG-KCL-NACL-NASULF-NA ASC-C 100 G PO SOLR: ORAL | Status: DC

## 2013-02-06 NOTE — Progress Notes (Signed)
No egg or soy allergies 

## 2013-02-09 ENCOUNTER — Encounter: Payer: Self-pay | Admitting: Internal Medicine

## 2013-02-20 ENCOUNTER — Telehealth: Payer: Self-pay | Admitting: Nurse Practitioner

## 2013-02-20 ENCOUNTER — Telehealth: Payer: Self-pay | Admitting: Internal Medicine

## 2013-02-20 NOTE — Telephone Encounter (Signed)
Pt would like to know the dosage of the vitamin d that patty would like for her to start taking.

## 2013-02-20 NOTE — Telephone Encounter (Signed)
Patient Information:  Caller Name: Shalina  Phone: [phone]  Patient: [name] [name], [name] [name]  Gender: Female  DOB: 09-11-1955  Age: 57 Years  PCP: Berniece Andreas (Family Practice)  Office Follow Up:  Does the office need to follow up with this patient?: No  Instructions For The Office: N/A   Symptoms  Reason For Call & Symptoms: Injury on 02/09/13 when slipped on steps and landed on R side and R hand. R thumb base has been aching and hurts with movement. Slight swelling, but no bruising.  Reviewed Health History In EMR: Yes  Reviewed Medications In EMR: Yes  Reviewed Allergies In EMR: Yes  Reviewed Surgeries / Procedures: Yes  Date of Onset of Symptoms: 02/09/2013  Guideline(s) Used:  Hand and Wrist Injury  Disposition Per Guideline:   See Within 3 Days in Office  Reason For Disposition Reached:   Injury and pain has not improved after 3 days  Advice Given:  Reassurance - Direct Blow (Contusion, Bruise)  Symptoms are mild pain, swelling, and/or bruising.  Apply a Cold Pack:  Apply a cold pack or an ice bag (wrapped in a moist towel) to the area for 20 minutes. Repeat in 1 hour, then every 4 hours while awake.  Continue this for the first 48 hours after an injury.  Wrap with an Elastic Bandage:  Wrap the injured part with a snug, elastic bandage for 48 hours.  The pressure from the bandage can make it feel better and help prevent swelling.  If your start to get numbness or tingling of your hand or fingers, the bandage may be too tight. Loosen the bandage wrap.  Elevate the Arm:  Lay down and put your arm on a pillow. This puts (elevates) the hand and wrist above the heart.  Do this for 15-20 minutes, 2-3 times a day, for the first two days.  This can also help decrease swelling, bruising, and pain.  Rest vs. Movement:  Movement is generally more healing in the long term than rest.  Continue normal activities as much as your pain permits.  Expected Course:  Pain, swelling,  and bruising usually start to get better 2 to 3 days after an injury.  Swelling most often is gone after 1 week.  Call Back If:  Pain becomes severe  Pain does not improve after 3 days  Pain or swelling lasts more than 2 weeks  You become worse.  Patient Will Follow Care Advice:  YES  Appointment Scheduled:  02/21/2013 12:00:00 Appointment Scheduled Provider:  Berniece Andreas [facility])

## 2013-02-21 ENCOUNTER — Encounter: Payer: Self-pay | Admitting: Family Medicine

## 2013-02-21 ENCOUNTER — Ambulatory Visit (INDEPENDENT_AMBULATORY_CARE_PROVIDER_SITE_OTHER): Payer: BC Managed Care – PPO | Admitting: Family Medicine

## 2013-02-21 VITALS — BP 118/72 | HR 74 | Temp 98.0°F | Wt 167.4 lb

## 2013-02-21 DIAGNOSIS — M79609 Pain in unspecified limb: Secondary | ICD-10-CM

## 2013-02-21 DIAGNOSIS — M79646 Pain in unspecified finger(s): Secondary | ICD-10-CM | POA: Insufficient documentation

## 2013-02-21 NOTE — Addendum Note (Signed)
Addended by: Deatra James on: 02/21/2013 10:45 AM   Modules accepted: Orders

## 2013-02-21 NOTE — Progress Notes (Signed)
Slipped at work, landed on her R side.  Fall was on 02/09/13.  No LOC.  FOOSH with the fall.  Since then R hand pain continues, other pains (R hip, knee) have resolved in meantime.  No bruising.  Pain with thumb movement, driving the car and holding steering wheel.   Milder L hand pain along the thenar area in meantime.   She reports pain at R 1st MCP joint and proximally along the 1st MC.    Meds, vitals, and allergies reviewed.   ROS: See HPI.  Otherwise, noncontributory.  nad Normal ROM at the B elbows, wrists and hands.  L hand very minimally ttp on the thenar area but not ttp along joint lines.  R hand with normal rom at all joints but pain along the thumb with resisted extension.  No motor deficit, no tendon deficit in the hand noted. Makes composite fist.  NV intact. No bruising.

## 2013-02-21 NOTE — Patient Instructions (Signed)
Use a thumb spica brace, ice the area, and take ibuprofen with food.  If not improving then notify your regular doctor.  Take care.

## 2013-02-21 NOTE — Assessment & Plan Note (Signed)
fx unlikely, no need to image today given the duration, can be done through PCP if not improved.   Likely R thumb strain, would use a spica in meantime, ice and ibuprofen.  L hand sx likely from compensation.  Dx d/w pt.  F/u prn.

## 2013-02-23 NOTE — Telephone Encounter (Signed)
Patty, can you advise what dose of Vitamin D should she be taking?

## 2013-02-23 NOTE — Telephone Encounter (Signed)
Message left to return call to [name] [name] at [phone].    

## 2013-02-23 NOTE — Telephone Encounter (Signed)
Last Vit D on RX was 73 which was good, but now going off her RX.  Her level may go down and with winter coming; I would advise 1000 IU daily.

## 2013-02-26 NOTE — Telephone Encounter (Signed)
Message left to return call to Leoma Folds at [phone].    

## 2013-02-27 ENCOUNTER — Encounter: Payer: Self-pay | Admitting: Internal Medicine

## 2013-02-27 ENCOUNTER — Ambulatory Visit (AMBULATORY_SURGERY_CENTER): Payer: BC Managed Care – PPO | Admitting: Internal Medicine

## 2013-02-27 VITALS — BP 100/54 | HR 66 | Temp 97.5°F | Resp 23 | Ht 65.0 in | Wt 169.0 lb

## 2013-02-27 DIAGNOSIS — Z1211 Encounter for screening for malignant neoplasm of colon: Secondary | ICD-10-CM

## 2013-02-27 DIAGNOSIS — Z800 Family history of malignant neoplasm of digestive organs: Secondary | ICD-10-CM

## 2013-02-27 MED ORDER — SODIUM CHLORIDE 0.9 % IV SOLN: 500.0000 mL | INTRAVENOUS | Status: DC

## 2013-02-27 NOTE — Patient Instructions (Signed)
Discharge instructions given with verbal understanding. Handouts on diverticulosis and a high fiber diet. Resume previous medications. YOU HAD AN ENDOSCOPIC PROCEDURE TODAY AT THE [facility]: Refer to the procedure report that was given to you for any specific questions about what was found during the examination.  If the procedure report does not answer your questions, please call your gastroenterologist to clarify.  If you requested that your care partner not be given the details of your procedure findings, then the procedure report has been included in a sealed envelope for you to review at your convenience later.  YOU SHOULD EXPECT: Some feelings of bloating in the abdomen. Passage of more gas than usual.  Walking can help get rid of the air that was put into your GI tract during the procedure and reduce the bloating. If you had a lower endoscopy (such as a colonoscopy or flexible sigmoidoscopy) you may notice spotting of blood in your stool or on the toilet paper. If you underwent a bowel prep for your procedure, then you may not have a normal bowel movement for a few days.  DIET: Your first meal following the procedure should be a light meal and then it is ok to progress to your normal diet.  A half-sandwich or bowl of soup is an example of a good first meal.  Heavy or fried foods are harder to digest and may make you feel nauseous or bloated.  Likewise meals heavy in dairy and vegetables can cause extra gas to form and this can also increase the bloating.  Drink plenty of fluids but you should avoid alcoholic beverages for 24 hours.  ACTIVITY: Your care partner should take you home directly after the procedure.  You should plan to take it easy, moving slowly for the rest of the day.  You can resume normal activity the day after the procedure however you should NOT DRIVE or use heavy machinery for 24 hours (because of the sedation medicines used during the test).    SYMPTOMS TO REPORT  IMMEDIATELY: A gastroenterologist can be reached at any hour.  During normal business hours, 8:30 AM to 5:00 PM Monday through Friday, call [phone].  After hours and on weekends, please call the GI answering service at [phone] who will take a message and have the physician on call contact you.   Following lower endoscopy (colonoscopy or flexible sigmoidoscopy):  Excessive amounts of blood in the stool  Significant tenderness or worsening of abdominal pains  Swelling of the abdomen that is new, acute  Fever of 100F or higher FOLLOW UP: If any biopsies were taken you will be contacted by phone or by letter within the next 1-3 weeks.  Call your gastroenterologist if you have not heard about the biopsies in 3 weeks.  Our staff will call the home number listed on your records the next business day following your procedure to check on you and address any questions or concerns that you may have at that time regarding the information given to you following your procedure. This is a courtesy call and so if there is no answer at the home number and we have not heard from you through the emergency physician on call, we will assume that you have returned to your regular daily activities without incident.  SIGNATURES/CONFIDENTIALITY: You and/or your care partner have signed paperwork which will be entered into your electronic medical record.  These signatures attest to the fact that that the information above on your After   Visit Summary has been reviewed and is understood.  Full responsibility of the confidentiality of this discharge information lies with you and/or your care-partner. 

## 2013-02-27 NOTE — Op Note (Signed)
[facility] [address] [place], [address]   COLONOSCOPY PROCEDURE REPORT  PATIENT: [name] [name], [name] [name]  MR#: [id number] BIRTHDATE: 20-Feb-1956 , 57  yrs. old GENDER: Female ENDOSCOPIST: Hart Carwin, MD REFERRED HQ:[facility] Lonie Peak, M.D. PROCEDURE DATE:  02/27/2013 PROCEDURE:   Colonoscopy, screening First Screening Colonoscopy - Avg.  risk and is 50 yrs.  old or older - No.  Prior Negative Screening - Now for repeat screening. 10 or more years since last screening Prior Negative Screening - Now for repeat screening.  Above average risk  History of Adenoma - Now for follow-up colonoscopy & has been > or = to 3 yrs.  N/A Polyps Removed Today? No.  Recommend repeat exam, <10 yrs? Yes. High risk (family or personal hx). ASA CLASS:   Class II INDICATIONS:Patient's immediate family history of colon cancer and prior colonoscopy 2004,2009, parent with colon cancer. MEDICATIONS: MAC sedation, administered by CRNA, Propofol (Diprivan), and Propofol (Diprivan) 260 mg IV  DESCRIPTION OF PROCEDURE:   After the risks benefits and alternatives of the procedure were thoroughly explained, informed consent was obtained.  A digital rectal exam revealed no abnormalities of the rectum.   The LB [id number]  endoscope was introduced through the anus and advanced to the cecum, which was identified by both the appendix and ileocecal valve. No adverse events experienced.   The quality of the prep was excellent, using MoviPrep  The instrument was then slowly withdrawn as the colon was fully examined.      COLON FINDINGS: A normal appearing cecum, ileocecal valve, and appendiceal orifice were identified.  The ascending, hepatic flexure, transverse, splenic flexure, descending, sigmoid colon and rectum appeared unremarkable.  No polyps or cancers were seen. There was mild diverticulosis of the left colon Retroflexed views revealed no abnormalities. The time to cecum=8 minutes  51 seconds. Withdrawal time=7 minutes 33 seconds.  The scope was withdrawn and the procedure completed. COMPLICATIONS: There were no complications.  ENDOSCOPIC IMPRESSION: Normal colon  RECOMMENDATIONS: 1.  High fiber diet 2.   recall colonoscopy in 5 years 3. mild sigmoid diverticulosis  eSigned:  Hart Carwin, MD 02/27/2013 10:20 AM   cc:   PATIENT NAME:  [name], [name] MR#: [id number]

## 2013-02-27 NOTE — Progress Notes (Signed)
Procedure ends, to recovery, report given and VSS. 

## 2013-02-27 NOTE — Progress Notes (Signed)
Patient did not experience any of the following events: a burn prior to discharge; a fall within the facility; wrong site/side/patient/procedure/implant event; or a hospital transfer or hospital admission upon discharge from the facility. (G8907) Patient did not have preoperative order for IV antibiotic SSI prophylaxis. (G8918)  

## 2013-03-02 ENCOUNTER — Telehealth: Payer: Self-pay | Admitting: *Deleted

## 2013-03-02 NOTE — Telephone Encounter (Signed)
Voice mail at [employer] pt. Name not identified no message left.

## 2013-03-24 ENCOUNTER — Other Ambulatory Visit (INDEPENDENT_AMBULATORY_CARE_PROVIDER_SITE_OTHER): Payer: BC Managed Care – PPO

## 2013-03-24 ENCOUNTER — Telehealth: Payer: Self-pay | Admitting: Nurse Practitioner

## 2013-03-24 DIAGNOSIS — Z0000 Encounter for general adult medical examination without abnormal findings: Secondary | ICD-10-CM

## 2013-03-24 DIAGNOSIS — E785 Hyperlipidemia, unspecified: Secondary | ICD-10-CM

## 2013-03-24 LAB — HEPATIC FUNCTION PANEL
ALT: 21 U/L (ref 0–35)
AST: 22 U/L (ref 0–37)
Albumin: 4 g/dL (ref 3.5–5.2)
Alkaline Phosphatase: 112 U/L (ref 39–117)
Bilirubin, Direct: 0.1 mg/dL (ref 0.0–0.3)
Total Bilirubin: 0.5 mg/dL (ref 0.3–1.2)
Total Protein: 7.3 g/dL (ref 6.0–8.3)

## 2013-03-24 LAB — CBC WITH DIFFERENTIAL/PLATELET
Basophils Absolute: 0 10*3/uL (ref 0.0–0.1)
Basophils Relative: 1.1 % (ref 0.0–3.0)
Eosinophils Absolute: 0.2 10*3/uL (ref 0.0–0.7)
Eosinophils Relative: 4.8 % (ref 0.0–5.0)
HCT: 40.8 % (ref 36.0–46.0)
Hemoglobin: 13.7 g/dL (ref 12.0–15.0)
Lymphocytes Relative: 30.6 % (ref 12.0–46.0)
Lymphs Abs: 1.4 10*3/uL (ref 0.7–4.0)
MCHC: 33.5 g/dL (ref 30.0–36.0)
MCV: 88.2 fl (ref 78.0–100.0)
Monocytes Absolute: 0.4 10*3/uL (ref 0.1–1.0)
Monocytes Relative: 9.9 % (ref 3.0–12.0)
Neutro Abs: 2.4 10*3/uL (ref 1.4–7.7)
Neutrophils Relative %: 53.6 % (ref 43.0–77.0)
Platelets: 246 10*3/uL (ref 150.0–400.0)
RBC: 4.62 Mil/uL (ref 3.87–5.11)
RDW: 13.3 % (ref 11.5–14.6)
WBC: 4.5 10*3/uL (ref 4.5–10.5)

## 2013-03-24 LAB — LIPID PANEL
Cholesterol: 229 mg/dL — ABNORMAL HIGH (ref 0–200)
HDL: 76.3 mg/dL (ref >39.00)
Total CHOL/HDL Ratio: 3
Triglycerides: 55 mg/dL (ref 0.0–149.0)
VLDL: 11 mg/dL (ref 0.0–40.0)

## 2013-03-24 LAB — BASIC METABOLIC PANEL
BUN: 15 mg/dL (ref 6–23)
CO2: 32 mEq/L (ref 19–32)
Calcium: 9.5 mg/dL (ref 8.4–10.5)
Chloride: 101 mEq/L (ref 96–112)
Creatinine, Ser: 0.7 mg/dL (ref 0.4–1.2)
GFR: 85.61 mL/min (ref >60.00)
Glucose, Bld: 98 mg/dL (ref 70–99)
Potassium: 4.5 mEq/L (ref 3.5–5.1)
Sodium: 139 mEq/L (ref 135–145)

## 2013-03-24 LAB — LDL CHOLESTEROL, DIRECT: Direct LDL: 132.7 mg/dL

## 2013-03-24 LAB — TSH: TSH: 1.39 u[IU]/mL (ref 0.35–5.50)

## 2013-03-24 NOTE — Telephone Encounter (Signed)
Patient has some questions about the vitamin D.

## 2013-03-24 NOTE — Telephone Encounter (Signed)
Spoke with patient. She was wondering how much Vitamin D she should be taking. She has 2000 iu bottles at home. I advised that she should be taking between 600-800 iu daily and that amount may be in her centrum that she takes daily and may not need extra. Patient will check centrum bottle and find out.   Routing to provider for final review. Patient agreeable to disposition. Will close encounter

## 2013-04-15 ENCOUNTER — Encounter: Payer: BC Managed Care – PPO | Admitting: Internal Medicine

## 2013-05-13 ENCOUNTER — Ambulatory Visit (INDEPENDENT_AMBULATORY_CARE_PROVIDER_SITE_OTHER): Payer: BC Managed Care – PPO | Admitting: Internal Medicine

## 2013-05-13 ENCOUNTER — Encounter: Payer: Self-pay | Admitting: Internal Medicine

## 2013-05-13 VITALS — BP 120/80 | HR 72 | Temp 98.0°F | Resp 16 | Ht 65.0 in | Wt 167.0 lb

## 2013-05-13 DIAGNOSIS — M25531 Pain in right wrist: Secondary | ICD-10-CM

## 2013-05-13 DIAGNOSIS — M79609 Pain in unspecified limb: Secondary | ICD-10-CM

## 2013-05-13 DIAGNOSIS — I83893 Varicose veins of bilateral lower extremities with other complications: Secondary | ICD-10-CM

## 2013-05-13 DIAGNOSIS — Z0000 Encounter for general adult medical examination without abnormal findings: Secondary | ICD-10-CM

## 2013-05-13 DIAGNOSIS — Z9181 History of falling: Secondary | ICD-10-CM

## 2013-05-13 DIAGNOSIS — M79644 Pain in right finger(s): Secondary | ICD-10-CM

## 2013-05-13 DIAGNOSIS — Z87442 Personal history of urinary calculi: Secondary | ICD-10-CM

## 2013-05-13 DIAGNOSIS — K219 Gastro-esophageal reflux disease without esophagitis: Secondary | ICD-10-CM

## 2013-05-13 DIAGNOSIS — E7800 Pure hypercholesterolemia, unspecified: Secondary | ICD-10-CM

## 2013-05-13 DIAGNOSIS — M25539 Pain in unspecified wrist: Secondary | ICD-10-CM

## 2013-05-13 DIAGNOSIS — Z23 Encounter for immunization: Secondary | ICD-10-CM

## 2013-05-13 NOTE — Progress Notes (Signed)
Pre visit review using our clinic review tool, if applicable. No additional management support is needed unless otherwise documented below in the visit note. 

## 2013-05-13 NOTE — Progress Notes (Signed)
Chief Complaint  Patient presents with  . Annual Exam    HPI: Patient comes in today for Preventive Health Care visit   Gyne Vit d  Per gyne  And off now.  otc 2000 vit d3  Right handed.  Larey Seat a few months ago on hands seen in sat clinic felt sprain and put in  Thumb ? Wrist support continues to wear it in day cause still bothersome hurts .  Hard to open jars etc.  Dr Nolen Mu.  Continuing prozac and doing well  No changes  prilosec  On gerd meds for a while. For reflux taks at night helps   ? Toenail fungus  Right 4 and 5 toes no pain ? rx   Declines flu vaccine  Father had RA after getting vaccine .  Has renal stones uncertain need for followup no current symptoms  ROS:  GEN/ HEENT: No fever, significant weight changes sweats headaches vision problems hearing changes, CV/ PULM; No chest pain shortness of breath cough, syncope,edema  change in exercise tolerance. GI /GU: No adominal pain, vomiting, change in bowel habits. No blood in the stool. No significant GU symptoms. SKIN/HEME: ,no acute skin rashes suspicious lesions or bleeding. No lymphadenopathy, nodules, masses.  NEURO/ PSYCH:  No neurologic signs such as weakness numbness. No depression anxiety. IMM/ Allergy: No unusual infections.  Allergy .   REST of 12 system review negative except as per HPI Health Maintenance  Topic Date Due  . Pap Smear  04/21/2011  . Tetanus/tdap  05/22/2011  . Influenza Vaccine  05/13/2014  . Mammogram  12/18/2014  . Colonoscopy  02/27/2018   Health Maintenance Review    Past Medical History  Diagnosis Date  . Depression     stable on medication  . GERD (gastroesophageal reflux disease)   . PUD (peptic ulcer disease) 1980's  . Renal cyst   . Diverticulosis   . Hyperlipidemia     no meds needed  . Osteopenia   . History of kidney stones     Family History  Problem Relation Age of Onset  . Arthritis Father     RA  . Pneumonia Father     deceased  . Colon cancer Father 10  .  Heart attack Father   . Breast cancer Sister 33  . Breast cancer Paternal Grandmother 90  . Osteoarthritis Mother   . COPD Mother   . Pneumonia Mother   . Osteoporosis Mother   . Cancer Maternal Grandmother     ? GI  . Heart failure Maternal Grandfather   . Heart failure Paternal Grandfather   . Esophageal cancer Neg Hx   . Rectal cancer Neg Hx   . Stomach cancer Neg Hx     History   Social History  . Marital Status: Divorced    Spouse Name: N/A    Number of Children: N/A  . Years of Education: N/A   Social History Main Topics  . Smoking status: Never Smoker   . Smokeless tobacco: Never Used  . Alcohol Use: No     Comment: occasionally  . Drug Use: No  . Sexual Activity: No   Other Topics Concern  . None   Social History Narrative   Manufacturing engineer.   Non smoker    Hh of 1 ... 2 cats    Single   G0P0   Mom is now in assisted living and she has moved into her old residence  Outpatient Encounter Prescriptions as of 05/13/2013  Medication Sig  . Cholecalciferol (VITAMIN D PO) Take 2,000 mg by mouth once a week.   Marland Kitchen FLUoxetine (PROZAC) 20 MG capsule Take 20 mg by mouth daily.    Marland Kitchen ibuprofen (ADVIL,MOTRIN) 100 MG chewable tablet Chew 100 mg by mouth every 8 (eight) hours as needed for fever.  . Multiple Vitamins-Minerals (CENTRUM ULTRA WOMENS PO) Take by mouth.    Marland Kitchen omeprazole (PRILOSEC) 20 MG capsule Take 20 mg by mouth daily.      EXAM:  BP 120/80  Pulse 72  Temp(Src) 98 F (36.7 C)  Resp 16  Ht 5\' 5"  (1.651 m)  Wt 167 lb (75.751 kg)  BMI 27.79 kg/m2  Body mass index is 27.79 kg/(m^2).  Physical Exam: Vital signs reviewed AVW:UJWJ is a well-developed well-nourished alert cooperative   female who appears her stated age in no acute distress.  HEENT: normocephalic atraumatic , Eyes: PERRL EOM's full, conjunctiva clear, Nares: paten,t no deformity discharge or tenderness., Ears: no deformity EAC's clear TMs  with normal landmarks. Mouth: clear OP, no lesions, edema.  Moist mucous membranes. Dentition in adequate repair. NECK: supple without masses, thyromegaly or bruits. CHEST/PULM:  Clear to auscultation and percussion breath sounds equal no wheeze , rales or rhonchi. No chest wall deformities or tenderness. CV: PMI is nondisplaced, S1 S2 no gallops, murmurs, rubs. Peripheral pulses are full without delay.No JVD .  Breast: normal by inspection . No dimpling, discharge, masses, tenderness or discharge .  ABDOMEN: Bowel sounds normal nontender  No guard or rebound, no hepato splenomegal no CVA tenderness.  No hernia. Extremtities:  No clubbing cyanosis or edema, no acute joint swelling or redness no focal atrophy right hand  Tender base of thumb neg snuff box tenderness noted nv seem intaact  Le VV  No ulcers edema  NEURO:  Oriented x3, cranial nerves 3-12 appear to be intact, no obvious focal weakness,gait within normal limits no abnormal reflexes or asymmetrical SKIN: No acute rashes normal turgor, color, no bruising or petechiae. Right lower extremity fourth and fifth toes with thickened slightly yellow nail. No erythema. Neurovascular fine PSYCH: Oriented, good eye contact, no obvious depression anxiety, cognition and judgment appear normal. LN: no cervical axillary inguinal adenopathy  Lab Results  Component Value Date   WBC 4.5 03/24/2013   HGB 13.7 03/24/2013   HCT 40.8 03/24/2013   PLT 246.0 03/24/2013   GLUCOSE 98 03/24/2013   CHOL 229* 03/24/2013   TRIG 55.0 03/24/2013   HDL 76.30 03/24/2013   LDLDIRECT 132.7 03/24/2013   ALT 21 03/24/2013   AST 22 03/24/2013   NA 139 03/24/2013   K 4.5 03/24/2013   CL 101 03/24/2013   CREATININE 0.7 03/24/2013   BUN 15 03/24/2013   CO2 32 03/24/2013   TSH 1.39 03/24/2013    ASSESSMENT AND PLAN:  Discussed the following assessment and plan:  Visit for preventive health examination  Thumb pain, right - Continues after injury  Wrist pain, right - Plan:  Ambulatory referral to Hand Surgery  VARICOSE VEINS LOWER EXTREMITIES W/OTH COMPS  Hx of fall - Plan: Ambulatory referral to Hand Surgery  GERD  History of kidney stones  Hypercholesteremia - lsi advised Counseled regarding healthy nutrition, exercise, sleep, injury prevention, calcium vit d and healthy weight .  Patient Care Team: Madelin Headings, MD as PCP - General Lauro Franklin, FNP as Nurse Practitioner (Nurse Practitioner) Milford Cage, MD as Consulting Physician (Urology) Gerhard Munch, MD  as Consulting Physician (Psychiatry) Patient Instructions  Will arrange   For  Hand evaluation. Continue lifestyle intervention healthy eating and exercise . Weight loss can help refux and cholesterol. wellnes visit in a year or as needed . Fu with urology as they decide for the  Kidney stones.     [name]. Panosh M.D.

## 2013-05-13 NOTE — Patient Instructions (Signed)
Will arrange   For  Hand evaluation. Continue lifestyle intervention healthy eating and exercise . Weight loss can help refux and cholesterol. wellnes visit in a year or as needed . Fu with urology as they decide for the  Kidney stones.

## 2013-05-21 DIAGNOSIS — K5792 Diverticulitis of intestine, part unspecified, without perforation or abscess without bleeding: Secondary | ICD-10-CM

## 2013-05-21 HISTORY — DX: Diverticulitis of intestine, part unspecified, without perforation or abscess without bleeding: K57.92

## 2013-06-16 ENCOUNTER — Encounter: Payer: Self-pay | Admitting: Internal Medicine

## 2013-06-26 ENCOUNTER — Encounter: Payer: Self-pay | Admitting: *Deleted

## 2013-07-02 ENCOUNTER — Telehealth: Payer: Self-pay | Admitting: Internal Medicine

## 2013-07-02 NOTE — Telephone Encounter (Signed)
LM /ok for pt to come in at 3pm Friday, 2/13

## 2013-07-02 NOTE — Telephone Encounter (Signed)
Yes

## 2013-07-02 NOTE — Telephone Encounter (Signed)
Pt has poss bronchitis, cough, cong, needs last appt of the day on fri.  Only SD @ 3pm, ok to use?

## 2013-07-03 ENCOUNTER — Encounter: Payer: Self-pay | Admitting: Internal Medicine

## 2013-07-03 ENCOUNTER — Ambulatory Visit (INDEPENDENT_AMBULATORY_CARE_PROVIDER_SITE_OTHER): Payer: BC Managed Care – PPO | Admitting: Internal Medicine

## 2013-07-03 VITALS — BP 116/84 | HR 77 | Temp 98.1°F | Ht 65.0 in | Wt 168.0 lb

## 2013-07-03 DIAGNOSIS — J988 Other specified respiratory disorders: Secondary | ICD-10-CM

## 2013-07-03 DIAGNOSIS — J22 Unspecified acute lower respiratory infection: Secondary | ICD-10-CM

## 2013-07-03 DIAGNOSIS — J329 Chronic sinusitis, unspecified: Secondary | ICD-10-CM

## 2013-07-03 MED ORDER — HYDROCODONE-HOMATROPINE 5-1.5 MG/5ML PO SYRP: 5.0000 mL | ORAL_SOLUTION | ORAL | Status: DC | PRN

## 2013-07-03 MED ORDER — AMOXICILLIN 875 MG PO TABS: 875.0000 mg | ORAL_TABLET | Freq: Three times a day (TID) | ORAL | Status: DC

## 2013-07-03 NOTE — Patient Instructions (Signed)
This is a viral respiratory infection  That usually gets better on its own in 10- 24 days. However because of the sinus pain and hx of sinus infections. We can add antibiotic if face pain persists despite    Decongestant and saline washes.    Sinusitis Sinusitis is redness, soreness, and swelling (inflammation) of the paranasal sinuses. Paranasal sinuses are air pockets within the bones of your face (beneath the eyes, the middle of the forehead, or above the eyes). In healthy paranasal sinuses, mucus is able to drain out, and air is able to circulate through them by way of your nose. However, when your paranasal sinuses are inflamed, mucus and air can become trapped. This can allow bacteria and other germs to grow and cause infection. Sinusitis can develop quickly and last only a short time (acute) or continue over a long period (chronic). Sinusitis that lasts for more than 12 weeks is considered chronic.  CAUSES  Causes of sinusitis include:  Allergies.  Structural abnormalities, such as displacement of the cartilage that separates your nostrils (deviated septum), which can decrease the air flow through your nose and sinuses and affect sinus drainage.  Functional abnormalities, such as when the small hairs (cilia) that line your sinuses and help remove mucus do not work properly or are not present. SYMPTOMS  Symptoms of acute and chronic sinusitis are the same. The primary symptoms are pain and pressure around the affected sinuses. Other symptoms include:  Upper toothache.  Earache.  Headache.  Bad breath.  Decreased sense of smell and taste.  A cough, which worsens when you are lying flat.  Fatigue.  Fever.  Thick drainage from your nose, which often is green and may contain pus (purulent).  Swelling and warmth over the affected sinuses. DIAGNOSIS  Your caregiver will perform a physical exam. During the exam, your caregiver may:  Look in your nose for signs of abnormal  growths in your nostrils (nasal polyps).  Tap over the affected sinus to check for signs of infection.  View the inside of your sinuses (endoscopy) with a special imaging device with a light attached (endoscope), which is inserted into your sinuses. If your caregiver suspects that you have chronic sinusitis, one or more of the following tests may be recommended:  Allergy tests.  Nasal culture A sample of mucus is taken from your nose and sent to a lab and screened for bacteria.  Nasal cytology A sample of mucus is taken from your nose and examined by your caregiver to determine if your sinusitis is related to an allergy. TREATMENT  Most cases of acute sinusitis are related to a viral infection and will resolve on their own within 10 days. Sometimes medicines are prescribed to help relieve symptoms (pain medicine, decongestants, nasal steroid sprays, or saline sprays).  However, for sinusitis related to a bacterial infection, your caregiver will prescribe antibiotic medicines. These are medicines that will help kill the bacteria causing the infection.  Rarely, sinusitis is caused by a fungal infection. In theses cases, your caregiver will prescribe antifungal medicine. For some cases of chronic sinusitis, surgery is needed. Generally, these are cases in which sinusitis recurs more than 3 times per year, despite other treatments. HOME CARE INSTRUCTIONS   Drink plenty of water. Water helps thin the mucus so your sinuses can drain more easily.  Use a humidifier.  Inhale steam 3 to 4 times a day (for example, sit in the bathroom with the shower running).  Apply a warm, moist  washcloth to your face 3 to 4 times a day, or as directed by your caregiver.  Use saline nasal sprays to help moisten and clean your sinuses.  Take over-the-counter or prescription medicines for pain, discomfort, or fever only as directed by your caregiver. SEEK IMMEDIATE MEDICAL CARE IF:  You have increasing pain or  severe headaches.  You have nausea, vomiting, or drowsiness.  You have swelling around your face.  You have vision problems.  You have a stiff neck.  You have difficulty breathing. MAKE SURE YOU:   Understand these instructions.  Will watch your condition.  Will get help right away if you are not doing well or get worse. Document Released: 05/07/2005 Document Revised: 07/30/2011 Document Reviewed: 05/22/2011 [facility] Patient Information 2014 [place], [place].  Upper Respiratory Infection, Adult An upper respiratory infection (URI) is also sometimes known as the common cold. The upper respiratory tract includes the nose, sinuses, throat, trachea, and bronchi. Bronchi are the airways leading to the lungs. Most people improve within 1 week, but symptoms can last up to 2 weeks. A residual cough may last even longer.  CAUSES Many different viruses can infect the tissues lining the upper respiratory tract. The tissues become irritated and inflamed and often become very moist. Mucus production is also common. A cold is contagious. You can easily spread the virus to others by oral contact. This includes kissing, sharing a glass, coughing, or sneezing. Touching your mouth or nose and then touching a surface, which is then touched by another person, can also spread the virus. SYMPTOMS  Symptoms typically develop 1 to 3 days after you come in contact with a cold virus. Symptoms vary from person to person. They may include:  Runny nose.  Sneezing.  Nasal congestion.  Sinus irritation.  Sore throat.  Loss of voice (laryngitis).  Cough.  Fatigue.  Muscle aches.  Loss of appetite.  Headache.  Low-grade fever. DIAGNOSIS  You might diagnose your own cold based on familiar symptoms, since most people get a cold 2 to 3 times a year. Your caregiver can confirm this based on your exam. Most importantly, your caregiver can check that your symptoms are not due to another disease such as  strep throat, sinusitis, pneumonia, asthma, or epiglottitis. Blood tests, throat tests, and X-rays are not necessary to diagnose a common cold, but they may sometimes be helpful in excluding other more serious diseases. Your caregiver will decide if any further tests are required. RISKS AND COMPLICATIONS  You may be at risk for a more severe case of the common cold if you smoke cigarettes, have chronic heart disease (such as heart failure) or lung disease (such as asthma), or if you have a weakened immune system. The very young and very old are also at risk for more serious infections. Bacterial sinusitis, middle ear infections, and bacterial pneumonia can complicate the common cold. The common cold can worsen asthma and chronic obstructive pulmonary disease (COPD). Sometimes, these complications can require emergency medical care and may be life-threatening. PREVENTION  The best way to protect against getting a cold is to practice good hygiene. Avoid oral or hand contact with people with cold symptoms. Wash your hands often if contact occurs. There is no clear evidence that vitamin C, vitamin E, echinacea, or exercise reduces the chance of developing a cold. However, it is always recommended to get plenty of rest and practice good nutrition. TREATMENT  Treatment is directed at relieving symptoms. There is no cure. Antibiotics are not  effective, because the infection is caused by a virus, not by bacteria. Treatment may include:  Increased fluid intake. Sports drinks offer valuable electrolytes, sugars, and fluids.  Breathing heated mist or steam (vaporizer or shower).  Eating chicken soup or other clear broths, and maintaining good nutrition.  Getting plenty of rest.  Using gargles or lozenges for comfort.  Controlling fevers with ibuprofen or acetaminophen as directed by your caregiver.  Increasing usage of your inhaler if you have asthma. Zinc gel and zinc lozenges, taken in the first 24 hours  of the common cold, can shorten the duration and lessen the severity of symptoms. Pain medicines may help with fever, muscle aches, and throat pain. A variety of non-prescription medicines are available to treat congestion and runny nose. Your caregiver can make recommendations and may suggest nasal or lung inhalers for other symptoms.  HOME CARE INSTRUCTIONS   Only take over-the-counter or prescription medicines for pain, discomfort, or fever as directed by your caregiver.  Use a warm mist humidifier or inhale steam from a shower to increase air moisture. This may keep secretions moist and make it easier to breathe.  Drink enough water and fluids to keep your urine clear or pale yellow.  Rest as needed.  Return to work when your temperature has returned to normal or as your caregiver advises. You may need to stay home longer to avoid infecting others. You can also use a face mask and careful hand washing to prevent spread of the virus. SEEK MEDICAL CARE IF:   After the first few days, you feel you are getting worse rather than better.  You need your caregiver's advice about medicines to control symptoms.  You develop chills, worsening shortness of breath, or brown or red sputum. These may be signs of pneumonia.  You develop yellow or brown nasal discharge or pain in the face, especially when you bend forward. These may be signs of sinusitis.  You develop a fever, swollen neck glands, pain with swallowing, or white areas in the back of your throat. These may be signs of strep throat. SEEK IMMEDIATE MEDICAL CARE IF:   You have a fever.  You develop severe or persistent headache, ear pain, sinus pain, or chest pain.  You develop wheezing, a prolonged cough, cough up blood, or have a change in your usual mucus (if you have chronic lung disease).  You develop sore muscles or a stiff neck. Document Released: 10/31/2000 Document Revised: 07/30/2011 Document Reviewed: 09/08/2010 [facility]  Patient Information 2014 [place], [place].

## 2013-07-03 NOTE — Progress Notes (Signed)
Chief Complaint  Patient presents with  . Nasal Congestion    Cough is productive of a yellow sputum.  Sx started on Sunday and has gotten worse.  . Cough  . Sinus Pressure  . Wheezing    HPI: Patient comes in today for SDA for  new problem evaluation. Mild wheezing  . Coughing all week and now in head  Mucinex Stayed home from work .  Now head stuffy .  phlegm lemon colored and thick . Had worse with coughing in frontal max area  ? Sob in chest.  ? cp middle back middle  No v or d.  No close illness  ROS: See pertinent positives and negatives per HPI. No fever chills at present gets sins and bronchitis yearly .  No asthma no inhalers   Past Medical History  Diagnosis Date  . Depression     stable on medication  . GERD (gastroesophageal reflux disease)   . PUD (peptic ulcer disease) 1980's  . Renal cyst   . Diverticulosis   . Hyperlipidemia     no meds needed  . Osteopenia   . History of kidney stones     Family History  Problem Relation Age of Onset  . Rheum arthritis Father   . Pneumonia Father     deceased  . Colon cancer Father 102  . Heart attack Father   . Breast cancer Sister 62  . Breast cancer Paternal Grandmother 36  . Osteoarthritis Mother   . COPD Mother   . Pneumonia Mother   . Osteoporosis Mother   . Cancer Maternal Grandmother     ? GI  . Heart failure Maternal Grandfather   . Heart failure Paternal Grandfather   . Esophageal cancer Neg Hx   . Rectal cancer Neg Hx   . Stomach cancer Neg Hx     History   Social History  . Marital Status: Divorced    Spouse Name: N/A    Number of Children: N/A  . Years of Education: N/A   Social History Main Topics  . Smoking status: Never Smoker   . Smokeless tobacco: Never Used  . Alcohol Use: No     Comment: occasionally  . Drug Use: No  . Sexual Activity: No   Other Topics Concern  . None   Social History Narrative   Manufacturing engineer.   Non  smoker    Hh of 1 ... 2 cats    Single   [id number]   Mom is now in assisted living and she has moved into her old residence    Outpatient Encounter Prescriptions as of 07/03/2013  Medication Sig  . Cholecalciferol (VITAMIN D PO) Take 2,000 mg by mouth once a week.   Marland Kitchen FLUoxetine (PROZAC) 20 MG capsule Take 20 mg by mouth daily.    Marland Kitchen ibuprofen (ADVIL,MOTRIN) 100 MG chewable tablet Chew 100 mg by mouth every 8 (eight) hours as needed for fever.  . Multiple Vitamins-Minerals (CENTRUM ULTRA WOMENS PO) Take by mouth.    Marland Kitchen omeprazole (PRILOSEC) 20 MG capsule Take 20 mg by mouth daily.    Marland Kitchen amoxicillin (AMOXIL) 875 MG tablet Take 1 tablet (875 mg total) by mouth 3 (three) times daily. For sinusitis  . HYDROcodone-homatropine (HYCODAN) 5-1.5 MG/5ML syrup Take 5 mLs by mouth every 4 (four) hours as needed for cough (at night).  . propranolol (INDERAL) 10 MG tablet     EXAM:  BP 116/84  Pulse 77  Temp(Src) 98.1 F (36.7 C) (Oral)  Ht 5\' 5"  (1.651 m)  Wt 168 lb (76.204 kg)  BMI 27.96 kg/m2  SpO2 97%  Body mass index is 27.96 kg/(m^2). WDWN in NAD  quiet respirations; very  congested  somewhat hoarse. Non toxic . HEENT: Normocephalic ;atraumatic , Eyes;  PERRL, EOMs  Full, lids and conjunctiva clear,,Ears: no deformities, canals nl, TM landmarks normal, Nose: no deformity or discharge but4+ congested;face mild tender left max and ? frontal tender Mouth : OP clear without lesion or edema . Neck: Supple without adenopathy or masses or bruits Chest:    Few exp wheez musical sounds bs =   norales or rhonchi CV:  S1-S2 no gallops or murmurs peripheral perfusion is normal Skin :nl perfusion and no acute rashes    ASSESSMENT AND PLAN:  Discussed the following assessment and plan:  Sinusitis  Acute respiratory infection  -Patient advised to return or notify health care team  if symptoms worsen or persist or new concerns arise.  Patient Instructions  This is a viral respiratory infection  That  usually gets better on its own in 10- 24 days. However because of the sinus pain and hx of sinus infections. We can add antibiotic if face pain persists despite    Decongestant and saline washes.    Sinusitis Sinusitis is redness, soreness, and swelling (inflammation) of the paranasal sinuses. Paranasal sinuses are air pockets within the bones of your face (beneath the eyes, the middle of the forehead, or above the eyes). In healthy paranasal sinuses, mucus is able to drain out, and air is able to circulate through them by way of your nose. However, when your paranasal sinuses are inflamed, mucus and air can become trapped. This can allow bacteria and other germs to grow and cause infection. Sinusitis can develop quickly and last only a short time (acute) or continue over a long period (chronic). Sinusitis that lasts for more than 12 weeks is considered chronic.  CAUSES  Causes of sinusitis include:  Allergies.  Structural abnormalities, such as displacement of the cartilage that separates your nostrils (deviated septum), which can decrease the air flow through your nose and sinuses and affect sinus drainage.  Functional abnormalities, such as when the small hairs (cilia) that line your sinuses and help remove mucus do not work properly or are not present. SYMPTOMS  Symptoms of acute and chronic sinusitis are the same. The primary symptoms are pain and pressure around the affected sinuses. Other symptoms include:  Upper toothache.  Earache.  Headache.  Bad breath.  Decreased sense of smell and taste.  A cough, which worsens when you are lying flat.  Fatigue.  Fever.  Thick drainage from your nose, which often is green and may contain pus (purulent).  Swelling and warmth over the affected sinuses. DIAGNOSIS  Your caregiver will perform a physical exam. During the exam, your caregiver may:  Look in your nose for signs of abnormal growths in your nostrils (nasal polyps).  Tap  over the affected sinus to check for signs of infection.  View the inside of your sinuses (endoscopy) with a special imaging device with a light attached (endoscope), which is inserted into your sinuses. If your caregiver suspects that you have chronic sinusitis, one or more of the following tests may be recommended:  Allergy tests.  Nasal culture A sample of mucus is taken from your nose and sent to a lab and screened for bacteria.  Nasal cytology A sample of mucus is  taken from your nose and examined by your caregiver to determine if your sinusitis is related to an allergy. TREATMENT  Most cases of acute sinusitis are related to a viral infection and will resolve on their own within 10 days. Sometimes medicines are prescribed to help relieve symptoms (pain medicine, decongestants, nasal steroid sprays, or saline sprays).  However, for sinusitis related to a bacterial infection, your caregiver will prescribe antibiotic medicines. These are medicines that will help kill the bacteria causing the infection.  Rarely, sinusitis is caused by a fungal infection. In theses cases, your caregiver will prescribe antifungal medicine. For some cases of chronic sinusitis, surgery is needed. Generally, these are cases in which sinusitis recurs more than 3 times per year, despite other treatments. HOME CARE INSTRUCTIONS   Drink plenty of water. Water helps thin the mucus so your sinuses can drain more easily.  Use a humidifier.  Inhale steam 3 to 4 times a day (for example, sit in the bathroom with the shower running).  Apply a warm, moist washcloth to your face 3 to 4 times a day, or as directed by your caregiver.  Use saline nasal sprays to help moisten and clean your sinuses.  Take over-the-counter or prescription medicines for pain, discomfort, or fever only as directed by your caregiver. SEEK IMMEDIATE MEDICAL CARE IF:  You have increasing pain or severe headaches.  You have nausea, vomiting,  or drowsiness.  You have swelling around your face.  You have vision problems.  You have a stiff neck.  You have difficulty breathing. MAKE SURE YOU:   Understand these instructions.  Will watch your condition.  Will get help right away if you are not doing well or get worse. Document Released: 05/07/2005 Document Revised: 07/30/2011 Document Reviewed: 05/22/2011 [facility] Patient Information 2014 [place], [place].  Upper Respiratory Infection, Adult An upper respiratory infection (URI) is also sometimes known as the common cold. The upper respiratory tract includes the nose, sinuses, throat, trachea, and bronchi. Bronchi are the airways leading to the lungs. Most people improve within 1 week, but symptoms can last up to 2 weeks. A residual cough may last even longer.  CAUSES Many different viruses can infect the tissues lining the upper respiratory tract. The tissues become irritated and inflamed and often become very moist. Mucus production is also common. A cold is contagious. You can easily spread the virus to others by oral contact. This includes kissing, sharing a glass, coughing, or sneezing. Touching your mouth or nose and then touching a surface, which is then touched by another person, can also spread the virus. SYMPTOMS  Symptoms typically develop 1 to 3 days after you come in contact with a cold virus. Symptoms vary from person to person. They may include:  Runny nose.  Sneezing.  Nasal congestion.  Sinus irritation.  Sore throat.  Loss of voice (laryngitis).  Cough.  Fatigue.  Muscle aches.  Loss of appetite.  Headache.  Low-grade fever. DIAGNOSIS  You might diagnose your own cold based on familiar symptoms, since most people get a cold 2 to 3 times a year. Your caregiver can confirm this based on your exam. Most importantly, your caregiver can check that your symptoms are not due to another disease such as strep throat, sinusitis, pneumonia, asthma, or  epiglottitis. Blood tests, throat tests, and X-rays are not necessary to diagnose a common cold, but they may sometimes be helpful in excluding other more serious diseases. Your caregiver will decide if any further tests are  required. RISKS AND COMPLICATIONS  You may be at risk for a more severe case of the common cold if you smoke cigarettes, have chronic heart disease (such as heart failure) or lung disease (such as asthma), or if you have a weakened immune system. The very young and very old are also at risk for more serious infections. Bacterial sinusitis, middle ear infections, and bacterial pneumonia can complicate the common cold. The common cold can worsen asthma and chronic obstructive pulmonary disease (COPD). Sometimes, these complications can require emergency medical care and may be life-threatening. PREVENTION  The best way to protect against getting a cold is to practice good hygiene. Avoid oral or hand contact with people with cold symptoms. Wash your hands often if contact occurs. There is no clear evidence that vitamin C, vitamin E, echinacea, or exercise reduces the chance of developing a cold. However, it is always recommended to get plenty of rest and practice good nutrition. TREATMENT  Treatment is directed at relieving symptoms. There is no cure. Antibiotics are not effective, because the infection is caused by a virus, not by bacteria. Treatment may include:  Increased fluid intake. Sports drinks offer valuable electrolytes, sugars, and fluids.  Breathing heated mist or steam (vaporizer or shower).  Eating chicken soup or other clear broths, and maintaining good nutrition.  Getting plenty of rest.  Using gargles or lozenges for comfort.  Controlling fevers with ibuprofen or acetaminophen as directed by your caregiver.  Increasing usage of your inhaler if you have asthma. Zinc gel and zinc lozenges, taken in the first 24 hours of the common cold, can shorten the duration  and lessen the severity of symptoms. Pain medicines may help with fever, muscle aches, and throat pain. A variety of non-prescription medicines are available to treat congestion and runny nose. Your caregiver can make recommendations and may suggest nasal or lung inhalers for other symptoms.  HOME CARE INSTRUCTIONS   Only take over-the-counter or prescription medicines for pain, discomfort, or fever as directed by your caregiver.  Use a warm mist humidifier or inhale steam from a shower to increase air moisture. This may keep secretions moist and make it easier to breathe.  Drink enough water and fluids to keep your urine clear or pale yellow.  Rest as needed.  Return to work when your temperature has returned to normal or as your caregiver advises. You may need to stay home longer to avoid infecting others. You can also use a face mask and careful hand washing to prevent spread of the virus. SEEK MEDICAL CARE IF:   After the first few days, you feel you are getting worse rather than better.  You need your caregiver's advice about medicines to control symptoms.  You develop chills, worsening shortness of breath, or brown or red sputum. These may be signs of pneumonia.  You develop yellow or brown nasal discharge or pain in the face, especially when you bend forward. These may be signs of sinusitis.  You develop a fever, swollen neck glands, pain with swallowing, or white areas in the back of your throat. These may be signs of strep throat. SEEK IMMEDIATE MEDICAL CARE IF:   You have a fever.  You develop severe or persistent headache, ear pain, sinus pain, or chest pain.  You develop wheezing, a prolonged cough, cough up blood, or have a change in your usual mucus (if you have chronic lung disease).  You develop sore muscles or a stiff neck. Document Released: 10/31/2000 Document Revised:  07/30/2011 Document Reviewed: 09/08/2010 [employer] Patient Information 2014 [place],  [place].      Standley Brooking. Panosh M.D.  Pre visit review using our clinic review tool, if applicable. No additional management support is needed unless otherwise documented below in the visit note.

## 2013-08-14 ENCOUNTER — Ambulatory Visit: Payer: BC Managed Care – PPO | Admitting: Internal Medicine

## 2013-08-17 ENCOUNTER — Encounter: Payer: Self-pay | Admitting: Internal Medicine

## 2013-08-17 ENCOUNTER — Ambulatory Visit (INDEPENDENT_AMBULATORY_CARE_PROVIDER_SITE_OTHER): Payer: BC Managed Care – PPO | Admitting: Internal Medicine

## 2013-08-17 VITALS — BP 102/62 | HR 76 | Ht 65.0 in | Wt 168.0 lb

## 2013-08-17 DIAGNOSIS — R1013 Epigastric pain: Secondary | ICD-10-CM

## 2013-08-17 DIAGNOSIS — K219 Gastro-esophageal reflux disease without esophagitis: Secondary | ICD-10-CM

## 2013-08-17 MED ORDER — OMEPRAZOLE 20 MG PO CPDR: 20.0000 mg | DELAYED_RELEASE_CAPSULE | Freq: Two times a day (BID) | ORAL | Status: DC

## 2013-08-17 NOTE — Progress Notes (Signed)
[name] [name] 06-19-1955 [id number]  Note: This dictation was prepared with Dragon digital system. Any transcriptional errors that result from this procedure are unintentional.   History of Present Illness:  This is a 58 year old white female with gastroesophageal reflux. A prior upper endoscopy in November 2008 for epigastric pain showed bile gastritis. We have seen her in the past for colorectal screenings . She had colonoscopies in 2004, 2009 and  in January 2014. She has a parent who had colon cancer. She had an upper abdominal ultrasound in June 2010 and it was consistent with postcholecystectomy state. About a month ago, she developed an upper respiratory infection and sinusitis and was started on erythromycin. She was coughing and taking over-the-counter decongestants. She was also under a lot of stress at that time. She developed epigastric and substernal chest pain. She continued to take her Prilosec 20 mg at bedtime but also started on TUMS and Rolaids during the day. She is now 80% improved. Her upper respiratory symptoms have resolved.    Past Medical History  Diagnosis Date  . Depression     stable on medication  . GERD (gastroesophageal reflux disease)   . PUD (peptic ulcer disease) 1980's  . Renal cyst   . Diverticulosis   . Hyperlipidemia     no meds needed  . Osteopenia   . Arthritis   . Gallstones   . IBS (irritable bowel syndrome)     Past Surgical History  Procedure Laterality Date  . Cholecystectomy  1987  . Wisdom tooth extraction  age 47 & 57    lower teeth done first and upper wisdom teeth last  . Irrigation and debridement sebaceous cyst Right 3/ 2014    chest wall  . Colonoscopy    . Upper gastrointestinal endoscopy      No Known Allergies  Family history and social history have been reviewed.  Review of Systems: Negative for dysphagia heartburn. She has gained about 10 pounds  The remainder of the 10 point ROS is negative except as outlined in  the H&P  Physical Exam: General Appearance Well developed, in no distress Eyes  Non icteric  HEENT  Non traumatic, normocephalic  Mouth No lesion, tongue papillated, no cheilosis Neck Supple without adenopathy, thyroid not enlarged, no carotid bruits, no JVD Lungs Clear to auscultation bilaterally COR Normal S1, normal S2, regular rhythm, no murmur, quiet precordium Abdomen soft abdomen nontender. Normoactive bowel sounds. No distention. Right upper quadrant normal  Rectal not done. Extremities  No pedal edema Skin No lesions Neurological Alert and oriented x 3 Psychological Normal mood and affect  Assessment and Plan:   Problem #1 History of gastroesophageal reflux and bile gastritis. She had an exacerbation of epigastric pain following an upper respiratory infection most likely due to  Antibiotics. and cough She is now much improved. We will increase her Prilosec to 20 mg twice a day on a when necessary basis.  Problem #2 Colorectal screening. Her last colonoscopy was in January 2014. A recall colonoscopy will be due in January 2019.    Delfin Edis 08/17/2013

## 2013-08-17 NOTE — Patient Instructions (Signed)
We have sent the following medications to your pharmacy for you to pick up at your convenience: Prilosec 20 mg twice daily  CC:Dr Panosh

## 2013-09-28 ENCOUNTER — Ambulatory Visit (INDEPENDENT_AMBULATORY_CARE_PROVIDER_SITE_OTHER): Payer: BC Managed Care – PPO | Admitting: Internal Medicine

## 2013-09-28 ENCOUNTER — Encounter: Payer: Self-pay | Admitting: Internal Medicine

## 2013-09-28 ENCOUNTER — Ambulatory Visit
Admission: RE | Admit: 2013-09-28 | Discharge: 2013-09-28 | Disposition: A | Discharge status: Home / Self Care | Payer: BC Managed Care – PPO | Source: Ambulatory Visit | Attending: Internal Medicine | Admitting: Internal Medicine

## 2013-09-28 VITALS — BP 124/80 | HR 101 | Temp 100.4°F | Ht 65.0 in | Wt 167.0 lb

## 2013-09-28 DIAGNOSIS — K5732 Diverticulitis of large intestine without perforation or abscess without bleeding: Secondary | ICD-10-CM

## 2013-09-28 DIAGNOSIS — R350 Frequency of micturition: Secondary | ICD-10-CM

## 2013-09-28 DIAGNOSIS — R109 Unspecified abdominal pain: Secondary | ICD-10-CM

## 2013-09-28 DIAGNOSIS — R3915 Urgency of urination: Secondary | ICD-10-CM

## 2013-09-28 HISTORY — DX: Diverticulitis of large intestine without perforation or abscess without bleeding: K57.32

## 2013-09-28 LAB — BASIC METABOLIC PANEL
BUN: 11 mg/dL (ref 6–23)
CO2: 30 mEq/L (ref 19–32)
Calcium: 9.2 mg/dL (ref 8.4–10.5)
Chloride: 98 mEq/L (ref 96–112)
Creatinine, Ser: 0.7 mg/dL (ref 0.4–1.2)
GFR: 94.22 mL/min (ref >60.00)
Glucose, Bld: 105 mg/dL — ABNORMAL HIGH (ref 70–99)
Potassium: 4.5 mEq/L (ref 3.5–5.1)
Sodium: 135 mEq/L (ref 135–145)

## 2013-09-28 LAB — POCT URINALYSIS DIPSTICK
Bilirubin, UA: NEGATIVE
Blood, UA: NEGATIVE
Glucose, UA: NEGATIVE
Ketones, UA: NEGATIVE
Leukocytes, UA: NEGATIVE
Nitrite, UA: NEGATIVE
Protein, UA: NEGATIVE
Spec Grav, UA: 1.015
Urobilinogen, UA: 0.2
pH, UA: 7.5

## 2013-09-28 LAB — CBC WITH DIFFERENTIAL/PLATELET
Basophils Absolute: 0.1 10*3/uL (ref 0.0–0.1)
Basophils Relative: 0.6 % (ref 0.0–3.0)
Eosinophils Absolute: 0 10*3/uL (ref 0.0–0.7)
Eosinophils Relative: 0.3 % (ref 0.0–5.0)
HCT: 39.4 % (ref 36.0–46.0)
Hemoglobin: 13 g/dL (ref 12.0–15.0)
Lymphocytes Relative: 14 % (ref 12.0–46.0)
Lymphs Abs: 1.6 10*3/uL (ref 0.7–4.0)
MCHC: 33.1 g/dL (ref 30.0–36.0)
MCV: 90.2 fl (ref 78.0–100.0)
Monocytes Absolute: 1.4 10*3/uL — ABNORMAL HIGH (ref 0.1–1.0)
Monocytes Relative: 12.7 % — ABNORMAL HIGH (ref 3.0–12.0)
Neutro Abs: 8 10*3/uL — ABNORMAL HIGH (ref 1.4–7.7)
Neutrophils Relative %: 72.4 % (ref 43.0–77.0)
Platelets: 245 10*3/uL (ref 150.0–400.0)
RBC: 4.37 Mil/uL (ref 3.87–5.11)
RDW: 13.7 % (ref 11.5–15.5)
WBC: 11.1 10*3/uL — ABNORMAL HIGH (ref 4.0–10.5)

## 2013-09-28 MED ORDER — CIPROFLOXACIN HCL 500 MG PO TABS: 500.0000 mg | ORAL_TABLET | Freq: Two times a day (BID) | ORAL | Status: DC

## 2013-09-28 MED ORDER — METRONIDAZOLE 500 MG PO TABS: 500.0000 mg | ORAL_TABLET | Freq: Three times a day (TID) | ORAL | Status: DC

## 2013-09-28 MED ORDER — IOHEXOL 300 MG/ML  SOLN
100.0000 mL | Freq: Once | INTRAMUSCULAR | Status: AC | PRN
  Administered 2013-09-28: 100 mL via INTRAVENOUS

## 2013-09-28 NOTE — Progress Notes (Signed)
Chief Complaint  Patient presents with  . Urinary Tract Infection  . Abdominal Pain    HPI: Patient comes in today for SDA for  new problem evaluation. Co of 1 days of Post prandial sharp lower abd pain   Doesn't feel like finishes when urinates  And began yesterday. Went 4 x last pm  Pain comes and goes lower abd and pain kept from sleeping.  No vomiting  But dec appetite.   Has ibs but never had sx like this usually bm q d had one today and  No help with sx . No blood and utd colonoscopy 2014  Had tics noted  No fever chills blood inurine.  ROS: See pertinent positives and negatives per HPI.no cough rash  ? Chilled last pm when turned over? No upper abdominal pain cough congestion sore throat.  Past Medical History  Diagnosis Date  . Depression     stable on medication  . GERD (gastroesophageal reflux disease)   . PUD (peptic ulcer disease) 1980's  . Renal cyst   . Diverticulosis   . Hyperlipidemia     no meds needed  . Osteopenia   . Arthritis   . Gallstones   . IBS (irritable bowel syndrome)     Family History  Problem Relation Age of Onset  . Rheum arthritis Father   . Pneumonia Father     deceased  . Colon cancer Father 76  . Heart attack Father   . Breast cancer Sister 85  . Breast cancer Paternal Grandmother 60  . Osteoarthritis Mother   . COPD Mother   . Pneumonia Mother   . Osteoporosis Mother   . Cancer Maternal Grandmother     ? GI  . Heart failure Maternal Grandfather   . Heart failure Paternal Grandfather   . Esophageal cancer Neg Hx   . Rectal cancer Neg Hx   . Stomach cancer Neg Hx     History   Social History  . Marital Status: Divorced    Spouse Name: N/A    Number of Children: 0  . Years of Education: N/A   Occupational History  . TEACHER    Social History Main Topics  . Smoking status: Never Smoker   . Smokeless tobacco: Never Used  . Alcohol Use: Yes     Comment: occasionally  . Drug Use: No  . Sexual Activity: No   Other  Topics Concern  . None   Social History Narrative   Copy.   Non smoker    Hh of 1 ... 2 cats    Single   G0P0   Mom is now in assisted living and she has moved into her old residence    Outpatient Encounter Prescriptions as of 09/28/2013  Medication Sig  . Cholecalciferol (VITAMIN D PO) Take 2,000 mg by mouth every other day.   . ciprofloxacin (CIPRO) 500 MG tablet Take 1 tablet (500 mg total) by mouth 2 (two) times daily.  Marland Kitchen FLUoxetine (PROZAC) 20 MG capsule Take 20 mg by mouth daily.    . metroNIDAZOLE (FLAGYL) 500 MG tablet Take 1 tablet (500 mg total) by mouth 3 (three) times daily.  . Multiple Vitamins-Minerals (CENTRUM ULTRA WOMENS PO) Take 1 tablet by mouth every other day.   Marland Kitchen omeprazole (PRILOSEC) 20 MG capsule Take 1 capsule (20 mg total) by mouth 2 (two) times daily.  . propranolol (INDERAL) 10 MG tablet     EXAM:  BP  124/80  Pulse 101  Temp(Src) 100.4 F (38 C) (Oral)  Ht 5\' 5"  (1.651 m)  Wt 167 lb (75.751 kg)  BMI 27.79 kg/m2  Body mass index is 27.79 kg/(m^2).  GENERAL: vitals reviewed and listed above, alert, oriented, appears well hydrated and in no acute distress non toxic  HEENT: atraumatic, conjunctiva  clear, no obvious abnormalities on inspection of external nose and ears OP : no lesion edema or exudate  NECK: no obvious masses on inspection palpation supple ano adenopathy  LUNGS: clear to auscultation bilaterally, no wheezes, rales or rhonchi,  CV: HRRR, no clubbing cyanosis or  peripheral edema nl cap refill  Abdomen:  Soft normal bowel sounds without hepatosplenomegaly,has guarding right lower q and both gutters  no rebound tender  no  rebound or masses no CVA tendernessNo lower pelvic pain   MS: moves all extremities without noticeable focal  abnormality PSYCH: pleasant and cooperative, no obvious depression or anxiety is a bit worried and uncomfortable. Lab Results  Component Value Date   WBC  11.1* 09/28/2013   HGB 13.0 09/28/2013   HCT 39.4 09/28/2013   PLT 245.0 09/28/2013   GLUCOSE 105* 09/28/2013   CHOL 229* 03/24/2013   TRIG 55.0 03/24/2013   HDL 76.30 03/24/2013   LDLDIRECT 132.7 03/24/2013   ALT 21 03/24/2013   AST 22 03/24/2013   NA 135 09/28/2013   K 4.5 09/28/2013   CL 98 09/28/2013   CREATININE 0.7 09/28/2013   BUN 11 09/28/2013   CO2 30 09/28/2013   TSH 1.39 03/24/2013    ASSESSMENT AND PLAN:  Discussed the following assessment and plan:  Abdominal cramps - concern for early appendicitis or diverticuitis  - Plan: Basic metabolic panel, CBC with Differential, CT Abdomen Pelvis W Contrast  Urgency of urination - Plan: POCT urinalysis dipstick, Basic metabolic panel, CBC with Differential  Urine frequency - no uti  - Plan: CT Abdomen Pelvis W Contrast Discussed differential diagnosis and appropriate treatment will get this done today stat followup as appropriate  Scan showed sigmoid diverticulitis we'll plan followup in 3-4 days begin on Cipro and metronidazole meantime and low residue diet and clear liquids. -Patient advised to return or notify health care team  if symptoms worsen ,persist or new concerns arise.  Patient Instructions  Not a uti . Concern about early appendicitis or diverticulitis . lab today and ct scan to assess.  We may begin antibiotics as if diverticultis. Fu depending on results    Diverticulitis A diverticulum is a small pouch or sac on the colon. Diverticulosis is the presence of these diverticula on the colon. Diverticulitis is the irritation (inflammation) or infection of diverticula. CAUSES  The colon and its diverticula contain bacteria. If food particles block the tiny opening to a diverticulum, the bacteria inside can grow and cause an increase in pressure. This leads to infection and inflammation and is called diverticulitis. SYMPTOMS   Abdominal pain and tenderness. Usually, the pain is located on the left side of your abdomen.  However, it could be located elsewhere.  Fever.  Bloating.  Feeling sick to your stomach (nausea).  Throwing up (vomiting).  Abnormal stools. DIAGNOSIS  Your caregiver will take a history and perform a physical exam. Since many things can cause abdominal pain, other tests may be necessary. Tests may include:  Blood tests.  Urine tests.  X-ray of the abdomen.  CT scan of the abdomen. Sometimes, surgery is needed to determine if diverticulitis or other conditions are causing your  symptoms. TREATMENT  Most of the time, you can be treated without surgery. Treatment includes:  Resting the bowels by only having liquids for a few days. As you improve, you will need to eat a low-fiber diet.  Intravenous (IV) fluids if you are losing body fluids (dehydrated).  Antibiotic medicines that treat infections may be given.  Pain and nausea medicine, if needed.  Surgery if the inflamed diverticulum has burst. HOME CARE INSTRUCTIONS   Try a clear liquid diet (broth, tea, or water for as long as directed by your caregiver). You may then gradually begin a low-fiber diet as tolerated.  A low-fiber diet is a diet with less than 10 grams of fiber. Choose the foods below to reduce fiber in the diet:  White breads, cereals, rice, and pasta.  Cooked fruits and vegetables or soft fresh fruits and vegetables without the skin.  Ground or well-cooked tender beef, ham, veal, lamb, pork, or poultry.  Eggs and seafood.  After your diverticulitis symptoms have improved, your caregiver may put you on a high-fiber diet. A high-fiber diet includes 14 grams of fiber for every 1000 calories consumed. For a standard 2000 calorie diet, you would need 28 grams of fiber. Follow these diet guidelines to help you increase the fiber in your diet. It is important to slowly increase the amount fiber in your diet to avoid gas, constipation, and bloating.  Choose whole-grain breads, cereals, pasta, and brown  rice.  Choose fresh fruits and vegetables with the skin on. Do not overcook vegetables because the more vegetables are cooked, the more fiber is lost.  Choose more nuts, seeds, legumes, dried peas, beans, and lentils.  Look for food products that have greater than 3 grams of fiber per serving on the Nutrition Facts label.  Take all medicine as directed by your caregiver.  If your caregiver has given you a follow-up appointment, it is very important that you go. Not going could result in lasting (chronic) or permanent injury, pain, and disability. If there is any problem keeping the appointment, call to reschedule. SEEK MEDICAL CARE IF:   Your pain does not improve.  You have a hard time advancing your diet beyond clear liquids.  Your bowel movements do not return to normal. SEEK IMMEDIATE MEDICAL CARE IF:   Your pain becomes worse.  You have an oral temperature above 102 F (38.9 C), not controlled by medicine.  You have repeated vomiting.  You have bloody or black, tarry stools.  Symptoms that brought you to your caregiver become worse or are not getting better. MAKE SURE YOU:   Understand these instructions.  Will watch your condition.  Will get help right away if you are not doing well or get worse. Document Released: 02/14/2005 Document Revised: 07/30/2011 Document Reviewed: 06/12/2010 [facility] Patient Information 2014 [place].    Standley Brooking. Christyan Reger M.D.  Total visit 30mins > 50% spent counseling and coordinating care

## 2013-09-28 NOTE — Patient Instructions (Addendum)
Not a uti . Concern about early appendicitis or diverticulitis . lab today and ct scan to assess.  We may begin antibiotics as if diverticultis. Fu depending on results    Diverticulitis A diverticulum is a small pouch or sac on the colon. Diverticulosis is the presence of these diverticula on the colon. Diverticulitis is the irritation (inflammation) or infection of diverticula. CAUSES  The colon and its diverticula contain bacteria. If food particles block the tiny opening to a diverticulum, the bacteria inside can grow and cause an increase in pressure. This leads to infection and inflammation and is called diverticulitis. SYMPTOMS   Abdominal pain and tenderness. Usually, the pain is located on the left side of your abdomen. However, it could be located elsewhere.  Fever.  Bloating.  Feeling sick to your stomach (nausea).  Throwing up (vomiting).  Abnormal stools. DIAGNOSIS  Your caregiver will take a history and perform a physical exam. Since many things can cause abdominal pain, other tests may be necessary. Tests may include:  Blood tests.  Urine tests.  X-ray of the abdomen.  CT scan of the abdomen. Sometimes, surgery is needed to determine if diverticulitis or other conditions are causing your symptoms. TREATMENT  Most of the time, you can be treated without surgery. Treatment includes:  Resting the bowels by only having liquids for a few days. As you improve, you will need to eat a low-fiber diet.  Intravenous (IV) fluids if you are losing body fluids (dehydrated).  Antibiotic medicines that treat infections may be given.  Pain and nausea medicine, if needed.  Surgery if the inflamed diverticulum has burst. HOME CARE INSTRUCTIONS   Try a clear liquid diet (broth, tea, or water for as long as directed by your caregiver). You may then gradually begin a low-fiber diet as tolerated.  A low-fiber diet is a diet with less than 10 grams of fiber. Choose the foods  below to reduce fiber in the diet:  White breads, cereals, rice, and pasta.  Cooked fruits and vegetables or soft fresh fruits and vegetables without the skin.  Ground or well-cooked tender beef, ham, veal, lamb, pork, or poultry.  Eggs and seafood.  After your diverticulitis symptoms have improved, your caregiver may put you on a high-fiber diet. A high-fiber diet includes 14 grams of fiber for every 1000 calories consumed. For a standard 2000 calorie diet, you would need 28 grams of fiber. Follow these diet guidelines to help you increase the fiber in your diet. It is important to slowly increase the amount fiber in your diet to avoid gas, constipation, and bloating.  Choose whole-grain breads, cereals, pasta, and brown rice.  Choose fresh fruits and vegetables with the skin on. Do not overcook vegetables because the more vegetables are cooked, the more fiber is lost.  Choose more nuts, seeds, legumes, dried peas, beans, and lentils.  Look for food products that have greater than 3 grams of fiber per serving on the Nutrition Facts label.  Take all medicine as directed by your caregiver.  If your caregiver has given you a follow-up appointment, it is very important that you go. Not going could result in lasting (chronic) or permanent injury, pain, and disability. If there is any problem keeping the appointment, call to reschedule. SEEK MEDICAL CARE IF:   Your pain does not improve.  You have a hard time advancing your diet beyond clear liquids.  Your bowel movements do not return to normal. SEEK IMMEDIATE MEDICAL CARE IF:  Your pain becomes worse.  You have an oral temperature above 102 F (38.9 C), not controlled by medicine.  You have repeated vomiting.  You have bloody or black, tarry stools.  Symptoms that brought you to your caregiver become worse or are not getting better. MAKE SURE YOU:   Understand these instructions.  Will watch your condition.  Will get  help right away if you are not doing well or get worse. Document Released: 02/14/2005 Document Revised: 07/30/2011 Document Reviewed: 06/12/2010 [facility] Patient Information 2014 [place].

## 2013-09-28 NOTE — Progress Notes (Signed)
Pre visit review using our clinic review tool, if applicable. No additional management support is needed unless otherwise documented below in the visit note. 

## 2013-10-02 ENCOUNTER — Ambulatory Visit (INDEPENDENT_AMBULATORY_CARE_PROVIDER_SITE_OTHER): Payer: BC Managed Care – PPO | Admitting: Internal Medicine

## 2013-10-02 ENCOUNTER — Encounter: Payer: Self-pay | Admitting: Internal Medicine

## 2013-10-02 VITALS — BP 120/76 | Temp 98.7°F | Ht 65.0 in | Wt 167.0 lb

## 2013-10-02 DIAGNOSIS — K5732 Diverticulitis of large intestine without perforation or abscess without bleeding: Secondary | ICD-10-CM

## 2013-10-02 DIAGNOSIS — Z800 Family history of malignant neoplasm of digestive organs: Secondary | ICD-10-CM

## 2013-10-02 NOTE — Patient Instructions (Signed)
Finish the antibiotic .  As we discussed . Improvement that you have is as expected or better .  Can eat a low residue diet ( low fiber ) until you are all better no pain and off antibiotics  . Perhaps  3-4 weeks.   Will  Flag Dr Olevia Perches  About what has transpired to see if she wishes to do any other follow up.

## 2013-10-02 NOTE — Progress Notes (Signed)
Chief Complaint  Patient presents with  . Follow-up    HPI: Comes in for follow up of  diverticultitis About 85% better no vomiting actually hungry now.no fever had bm no blood  But dark poss from med .  fam hxof colon cancer  Had colonoscopy in the fall 14 . Hx of renal cyst seen by uro and felt bening and stable ROS: See pertinent positives and negatives per HPI.  Past Medical History  Diagnosis Date  . Depression     stable on medication  . GERD (gastroesophageal reflux disease)   . PUD (peptic ulcer disease) 1980's  . Renal cyst     4.5-6 cm stable single  seen by uro in past  . Diverticulosis   . Hyperlipidemia     no meds needed  . Osteopenia   . Arthritis   . Gallstones   . IBS (irritable bowel syndrome)     Family History  Problem Relation Age of Onset  . Rheum arthritis Father   . Pneumonia Father     deceased  . Colon cancer Father 54  . Heart attack Father   . Breast cancer Sister 71  . Breast cancer Paternal Grandmother 24  . Osteoarthritis Mother   . COPD Mother   . Pneumonia Mother   . Osteoporosis Mother   . Cancer Maternal Grandmother     ? GI  . Heart failure Maternal Grandfather   . Heart failure Paternal Grandfather   . Esophageal cancer Neg Hx   . Rectal cancer Neg Hx   . Stomach cancer Neg Hx     History   Social History  . Marital Status: Divorced    Spouse Name: N/A    Number of Children: 0  . Years of Education: N/A   Occupational History  . TEACHER    Social History Main Topics  . Smoking status: Never Smoker   . Smokeless tobacco: Never Used  . Alcohol Use: Yes     Comment: occasionally  . Drug Use: No  . Sexual Activity: No   Other Topics Concern  . None   Social History Narrative   Copy.   Non smoker    Hh of 1 ... 2 cats    Single   G0P0   Mom is now in assisted living and she has moved into her old residence    Outpatient Encounter Prescriptions as  of 10/02/2013  Medication Sig  . Cholecalciferol (VITAMIN D PO) Take 2,000 mg by mouth every other day.   . ciprofloxacin (CIPRO) 500 MG tablet Take 1 tablet (500 mg total) by mouth 2 (two) times daily.  Marland Kitchen FLUoxetine (PROZAC) 20 MG capsule Take 20 mg by mouth daily.    . metroNIDAZOLE (FLAGYL) 500 MG tablet Take 1 tablet (500 mg total) by mouth 3 (three) times daily.  . Multiple Vitamins-Minerals (CENTRUM ULTRA WOMENS PO) Take 1 tablet by mouth every other day.   Marland Kitchen omeprazole (PRILOSEC) 20 MG capsule Take 1 capsule (20 mg total) by mouth 2 (two) times daily.  . propranolol (INDERAL) 10 MG tablet     EXAM:  BP 120/76  Temp(Src) 98.7 F (37.1 C) (Oral)  Ht 5\' 5"  (1.651 m)  Wt 167 lb (75.751 kg)  BMI 27.79 kg/m2  Body mass index is 27.79 kg/(m^2).  GENERAL: vitals reviewed and listed above, alert, oriented, appears well hydrated and in no acute distress HEENT: atraumatic, conjunctiva  clear, no obvious abnormalities on  inspection of external nose and ears NECK: no obvious masses on inspection palpation  LUNGS: clear to auscultation bilaterally, no wheezes, rales or rhonchi, CV: HRRR, no clubbing cyanosis or  peripheral edema nl cap refill  Abdomen:  Sof,t normal bowel sounds without hepatosplenomegaly, no guarding rebound or masses no CVA tenderness a bit tender llq but no peritoneal signs or masses MS: moves all extremities without noticeable focal  abnormality PSYCH: pleasant and cooperative, no obvious depression or anxiety reviewed labs ct scan and past US   Appendix is in RUQ reported to pt  ASSESSMENT AND PLAN:  Discussed the following assessment and plan:  Diverticulitis of colon without hemorrhage  Family history of colon cancer  -Patient advised to return or notify health care team  if symptoms worsen ,persist or new concerns arise.  Patient Instructions  [name] [name] the antibiotic .  As we discussed . Improvement that you have is as expected or better .  Can eat a low  residue diet ( low fiber ) until you are all better no pain and off antibiotics  . Perhaps  3-4 weeks.   Will  Flag Dr Olevia Perches  About what has transpired to see if she wishes to do any other follow up.     Standley Brooking. Panosh M.D.   Pre visit review using our clinic review tool, if applicable. No additional management support is needed unless otherwise documented below in the visit note.

## 2013-11-23 ENCOUNTER — Other Ambulatory Visit: Payer: Self-pay

## 2013-11-23 DIAGNOSIS — Z1231 Encounter for screening mammogram for malignant neoplasm of breast: Secondary | ICD-10-CM

## 2013-11-26 ENCOUNTER — Ambulatory Visit: Payer: BC Managed Care – PPO | Admitting: Nurse Practitioner

## 2013-11-27 ENCOUNTER — Encounter: Payer: Self-pay | Admitting: Nurse Practitioner

## 2013-11-27 ENCOUNTER — Ambulatory Visit (INDEPENDENT_AMBULATORY_CARE_PROVIDER_SITE_OTHER): Payer: BC Managed Care – PPO | Admitting: Nurse Practitioner

## 2013-11-27 VITALS — BP 132/80 | HR 76 | Ht 65.0 in | Wt 164.0 lb

## 2013-11-27 DIAGNOSIS — B373 Candidiasis of vulva and vagina: Secondary | ICD-10-CM

## 2013-11-27 DIAGNOSIS — B3731 Acute candidiasis of vulva and vagina: Secondary | ICD-10-CM

## 2013-11-27 MED ORDER — FLUCONAZOLE 150 MG PO TABS: 150.0000 mg | ORAL_TABLET | Freq: Once | ORAL | Status: DC

## 2013-11-27 NOTE — Patient Instructions (Signed)

## 2013-11-27 NOTE — Progress Notes (Signed)
Subjective:     Patient ID: [name] [name], female   DOB: Jan 02, 1956, 58 y.o.   MRN: [id number]  HPI  This 58 yo G0P0 DW Fe complains vaginal itching that has been ongoing for a few weeks.no vaginal discharge.  Not SA.   She initially thought related to change of soaps.  She has also been on a course of Cipro and Flagyl for flare of diverticulosis end of May.  She is going out of town to [place] this weekend with her sister.  No urinary symptoms.  Has used clobetesol prn without relief.   Review of Systems  Constitutional: Negative for fever, chills and fatigue.  Gastrointestinal: Negative for nausea, vomiting, abdominal pain, diarrhea, constipation, blood in stool, abdominal distention and anal bleeding.  Genitourinary: Negative for dysuria, urgency, frequency, flank pain, vaginal bleeding, vaginal discharge, vaginal pain, pelvic pain and dyspareunia.  Neurological: Negative.   Psychiatric/Behavioral: Negative.        Objective:   Physical Exam  Constitutional: She is oriented to person, place, and time. She appears well-developed and well-nourished.  Abdominal: Soft. She exhibits no distension. There is no tenderness. There is no rebound.  Genitourinary:  External areas of labia and peri anal area are irritated consistent with yeast.  No vaginal discharge.   Vaginal atrophy is apparent.  Wet Prep: PH: 4.0; KOH: + yeast, NSS: no clue cells.  Neurological: She is alert and oriented to person, place, and time.  Psychiatric: She has a normal mood and affect. Her behavior is normal. Judgment and thought content normal.       Assessment:     Yeast vulvar vaginitis Recent treatment for diverticulosis    Plan:      Diflucan 150 mg X 2 Change back to a non fragrant soap If symptoms continue to call back

## 2013-11-30 NOTE — Progress Notes (Signed)
Note reviewed, agree with plan.  Anacleto Batterman, MD  

## 2013-12-25 ENCOUNTER — Ambulatory Visit
Admission: RE | Admit: 2013-12-25 | Discharge: 2013-12-25 | Disposition: A | Discharge status: Home / Self Care | Payer: BC Managed Care – PPO | Source: Ambulatory Visit

## 2013-12-25 DIAGNOSIS — Z1231 Encounter for screening mammogram for malignant neoplasm of breast: Secondary | ICD-10-CM

## 2013-12-31 ENCOUNTER — Ambulatory Visit (INDEPENDENT_AMBULATORY_CARE_PROVIDER_SITE_OTHER): Payer: BC Managed Care – PPO | Admitting: Nurse Practitioner

## 2013-12-31 ENCOUNTER — Encounter: Payer: Self-pay | Admitting: Nurse Practitioner

## 2013-12-31 VITALS — BP 124/76 | HR 72 | Ht 65.25 in | Wt 167.0 lb

## 2013-12-31 DIAGNOSIS — M949 Disorder of cartilage, unspecified: Secondary | ICD-10-CM

## 2013-12-31 DIAGNOSIS — N952 Postmenopausal atrophic vaginitis: Secondary | ICD-10-CM

## 2013-12-31 DIAGNOSIS — N281 Cyst of kidney, acquired: Secondary | ICD-10-CM | POA: Insufficient documentation

## 2013-12-31 DIAGNOSIS — Q619 Cystic kidney disease, unspecified: Secondary | ICD-10-CM

## 2013-12-31 DIAGNOSIS — M899 Disorder of bone, unspecified: Secondary | ICD-10-CM

## 2013-12-31 DIAGNOSIS — M8580 Other specified disorders of bone density and structure, unspecified site: Secondary | ICD-10-CM | POA: Insufficient documentation

## 2013-12-31 DIAGNOSIS — E559 Vitamin D deficiency, unspecified: Secondary | ICD-10-CM

## 2013-12-31 DIAGNOSIS — Z01419 Encounter for gynecological examination (general) (routine) without abnormal findings: Secondary | ICD-10-CM

## 2013-12-31 NOTE — Progress Notes (Signed)
Patient ID: [name] [name], female   DOB: [birth year], 58 y.o.   MRN: [id number] 58 y.o. G0P0 Divorced Caucasian Fe here for annual exam.  Recent flare  Patient's last menstrual period was 11/18/2004.           Sexually active: no   The current method of family planning is post menopausal status.     Exercising: yes  walking  Smoker:  no  Health Maintenance: Pap:  09/10/2011  Normal with negative HR HPV MMG:  12/25/13, Bi-Rads 1:  negative  Colonoscopy:  02/27/13 diverticulosis recheck in 5 years BMD:   10/2009  T Score: spine -1.8;  left femur neck -0.9   TDaP:  05/13/13  Labs: HB:  (PCP is checking chol, etc) we check vit D   reports that she has never smoked. She has never used smokeless tobacco. She reports that she drinks alcohol. She reports that she does not use illicit drugs.  Past Medical History  Diagnosis Date  . Depression     stable on medication  . GERD (gastroesophageal reflux disease)   . PUD (peptic ulcer disease) 1980's  . Renal cyst     4.5-6 cm stable single  seen by uro in past  . Diverticulosis   . Hyperlipidemia     no meds needed  . Osteopenia   . Arthritis   . Gallstones   . IBS (irritable bowel syndrome)   . Diverticulitis 2015    Past Surgical History  Procedure Laterality Date  . Cholecystectomy  1987  . Wisdom tooth extraction  age 33 & 64    lower teeth done first and upper wisdom teeth last  . Irrigation and debridement sebaceous cyst Right 3/ 2014    chest wall  . Colonoscopy    . Upper gastrointestinal endoscopy      Current Outpatient Prescriptions  Medication Sig Dispense Refill  . Cholecalciferol (VITAMIN D PO) Take 2,000 mg by mouth every other day.       Marland Kitchen FLUoxetine (PROZAC) 20 MG capsule Take 20 mg by mouth daily.        . Multiple Vitamins-Minerals (CENTRUM ULTRA WOMENS PO) Take 1 tablet by mouth every other day.       Marland Kitchen omeprazole (PRILOSEC) 20 MG capsule Take 1 capsule (20 mg total) by mouth 2 (two) times daily.  60 capsule   10  . propranolol (INDERAL) 10 MG tablet Take 10 mg by mouth as needed.        No current facility-administered medications for this visit.    Family History  Problem Relation Age of Onset  . Rheum arthritis Father   . Pneumonia Father     deceased  . Colon cancer Father 42  . Heart attack Father   . Breast cancer Sister 58  . Breast cancer Paternal Grandmother 72  . Osteoarthritis Mother   . COPD Mother   . Pneumonia Mother   . Osteoporosis Mother   . Cancer Maternal Grandmother     ? GI  . Heart failure Maternal Grandfather   . Heart failure Paternal Grandfather   . Esophageal cancer Neg Hx   . Rectal cancer Neg Hx   . Stomach cancer Neg Hx     ROS:  Pertinent items are noted in HPI.  Otherwise, a comprehensive ROS was negative.  Exam:   BP 124/76  Pulse 72  Ht 5' 5.25" (1.657 m)  Wt 167 lb (75.751 kg)  BMI 27.59 kg/m2  LMP 11/18/2004 Height: 5'  5.25" (165.7 cm)  Ht Readings from Last 3 Encounters:  12/31/13 5' 5.25" (1.657 m)  11/27/13 5\' 5"  (1.651 m)  10/02/13 5\' 5"  (1.651 m)    General appearance: alert, cooperative and appears stated age Head: Normocephalic, without obvious abnormality, atraumatic Neck: no adenopathy, supple, symmetrical, trachea midline and thyroid normal to inspection and palpation Lungs: clear to auscultation bilaterally Breasts: normal appearance, no masses or tenderness Heart: regular rate and rhythm Abdomen: soft, non-tender; no masses,  no organomegaly Extremities: extremities normal, atraumatic, no cyanosis or edema Skin: Skin color, texture, turgor normal. No rashes or lesions Lymph nodes: Cervical, supraclavicular, and axillary nodes normal. No abnormal inguinal nodes palpated Neurologic: Grossly normal   Pelvic: External genitalia:  no lesions              Urethra:  normal appearing urethra with no masses, tenderness or lesions              Bartholin's and Skene's: normal                 Vagina: very atrophic appearing  vagina with pale color and discharge, no lesions              Cervix: anteverted              Pap taken: No. Bimanual Exam:  Uterus:  normal size, contour, position, consistency, mobility, non-tender              Adnexa: no mass, fullness, tenderness               Rectovaginal: Confirms               Anus:  Not done per request - recent flare of hemorrhoids  A:  Well Woman with normal exam  Postmenopausal no HRT  History of renal cyst - saw Dr. Jasmine December, last CT scan shows benign 4.2 X 4.8 X 5.6 cm left upper pole  Not SA; atrophic vaginitis  History of Vit d deficiency - on OTC Vit D now  P:   Reviewed health and wellness pertinent to exam  Pap smear not taken today  Mammogram is due 8/16  Order placed for repeat BMD  Follow with Vit D  Counseled on breast self exam, mammography screening, osteoporosis, adequate intake of calcium and vitamin D, diet and exercise, Kegel's exercises return annually or prn  An After Visit Summary was printed and given to the patient.

## 2013-12-31 NOTE — Patient Instructions (Addendum)

## 2014-01-01 LAB — VITAMIN D 25 HYDROXY (VIT D DEFICIENCY, FRACTURES): Vit D, 25-Hydroxy: 46 ng/mL (ref 30–89)

## 2014-01-03 NOTE — Progress Notes (Signed)
Encounter reviewed by Dr. Brook Silva.  

## 2014-01-04 ENCOUNTER — Telehealth: Payer: Self-pay

## 2014-01-04 NOTE — Telephone Encounter (Signed)
Pt informed of results below. Pt voiced understanding

## 2014-01-04 NOTE — Telephone Encounter (Signed)
Message copied by Gerda Diss on Mon Jan 04, 2014 12:15 PM ------      Message from: Regina Eck      Created: Fri Jan 01, 2014  2:55 PM       Reviewed for PG by DL      Notify Vitamin D is 29, continue current OTC Vitamin D ------

## 2014-02-12 ENCOUNTER — Ambulatory Visit: Payer: BC Managed Care – PPO | Admitting: Internal Medicine

## 2014-02-25 ENCOUNTER — Telehealth: Payer: Self-pay | Admitting: Nurse Practitioner

## 2014-02-25 NOTE — Telephone Encounter (Signed)
Pt says that she has problems with Hemorids and she has used several different creams with it and she now has a rash and broken out around the whole area. Has been this way for about a week and half. Says that it is red, inflammed, and itching. Said sometimes it makes her have vaginal itching too

## 2014-02-25 NOTE — Telephone Encounter (Signed)
Spoke with patient. Patient states that she has hemorrhoids and has been using a variety of different creams. Patient now has rectal itching and redness. This has been going on for around a week and a half. States that this is sometimes causing her to have vaginal itching as well. Advised will need to get her in to see Milford Cage, [place] for evaluation. Patient is agreeable. Offered appointment today at 3:30pm but patient declines due to another appointment at [facility]. Appointment scheduled for tomorrow at 2:30pm with Regina Eck CNM. Patient is agreeable to date and time and to see another provider.  Cc: Regina Eck CNM   Routing to provider for final review. Patient agreeable to disposition. Will close encounter

## 2014-02-26 ENCOUNTER — Encounter: Payer: Self-pay | Admitting: Certified Nurse Midwife

## 2014-02-26 ENCOUNTER — Ambulatory Visit (INDEPENDENT_AMBULATORY_CARE_PROVIDER_SITE_OTHER): Payer: BC Managed Care – PPO | Admitting: Certified Nurse Midwife

## 2014-02-26 VITALS — BP 110/64 | HR 68 | Resp 16 | Ht 65.25 in | Wt 169.0 lb

## 2014-02-26 DIAGNOSIS — B372 Candidiasis of skin and nail: Secondary | ICD-10-CM

## 2014-02-26 DIAGNOSIS — B3731 Acute candidiasis of vulva and vagina: Secondary | ICD-10-CM

## 2014-02-26 DIAGNOSIS — B373 Candidiasis of vulva and vagina: Secondary | ICD-10-CM

## 2014-02-26 MED ORDER — FLUCONAZOLE 150 MG PO TABS: ORAL_TABLET | ORAL | Status: DC

## 2014-02-26 NOTE — Progress Notes (Signed)
Reviewed personally.  M. Suzanne Tinzlee Craker, MD.  

## 2014-02-26 NOTE — Patient Instructions (Signed)
Yeast Infection of the Skin Some yeast on the skin is normal, but sometimes it causes an infection. If you have a yeast infection, it shows up as white or light brown patches on brown skin. You can see it better in the summer on tan skin. It causes light-colored holes in your suntan. It can happen on any area of the body. This cannot be passed from person to person. HOME CARE  Scrub your skin daily with a dandruff shampoo. Your rash may take a couple weeks to get well.  Do not scratch or itch the rash. GET HELP RIGHT AWAY IF:   You get another infection from scratching. The skin may get warm, red, and may ooze fluid.  The infection does not seem to be getting better. MAKE SURE YOU:  Understand these instructions.  Will watch your condition.  Will get help right away if you are not doing well or get worse. Document Released: 04/19/2008 Document Revised: 07/30/2011 Document Reviewed: 04/19/2008 [employer] Patient Information 2015 [employer]. This information is not intended to replace advice given to you by your health care provider. Make sure you discuss any questions you have with your health care provider.  

## 2014-02-26 NOTE — Progress Notes (Signed)
58 y.o. divorced white female g0p0 here with complaint of external vaginal and perineal symptoms of itching, burning. History of hemorrhoids, denies rectal bleeding or dark tarry stools. Has been using OTC Nupercainal ointment for the past 3 weeks on anal area and surrounding areas. Patient has also been using Clobetasol on external vaginal area with vaseline and Estrace mix for several weeks. Patient did not feel she "was not putting enough on area to heal",  no STD concerns. Urinary symptoms none .   O:Healthy female WDWN Affect: normal, orientation x 3  Exam:  Lymph node: no enlargement or tenderness Pelvic exam: External genital: shiny red, thin appearance with scant exudate, slight cracking noted, tender to touch, no lesions noted wet prep taken BUS: negative Vagina: white scant discharge noted. Ph:3.5   ,Wet prep taken Cervix: normal, non tender Uterus: normal, non tender Adnexa:normal, non tender, no masses or fullness noted Perineal: shiny red thin appearance, no hemorrhoid noted at anal opening, just slight cracking of skin, no lesions Patient shown area in mirror   Wet Prep results: positive for yeast vaginal and vulva/perineal   A:Yeast vaginitis/dermatitis Polypharmacy topical medication use No hemorrhoid noted   P:Discussed findings of yeast dermatitis/vaginitis and etiology. Discussed Aveeno sitz bath for comfort. Avoid moist clothes or pads for extended period of time. Do not apply any more topical medication to area of vulva, perineal or anal area. Discussed overuse of medication has thinned skin and produce yeast growth from the medication. Treat itching and burning with Aveeno sitz bath and pat dry. Discussed the oral medication will control the yeast. Patient voiced understanding. Discussed Estrace cream is for internal use only. Re check 2 weeks. Rx: Diflucan see order  Rv prn

## 2014-03-09 ENCOUNTER — Encounter: Payer: Self-pay | Admitting: Internal Medicine

## 2014-03-09 ENCOUNTER — Ambulatory Visit (INDEPENDENT_AMBULATORY_CARE_PROVIDER_SITE_OTHER): Payer: BC Managed Care – PPO | Admitting: Internal Medicine

## 2014-03-09 ENCOUNTER — Telehealth: Payer: Self-pay | Admitting: Certified Nurse Midwife

## 2014-03-09 VITALS — BP 106/64 | HR 76 | Ht 65.0 in | Wt 170.0 lb

## 2014-03-09 DIAGNOSIS — K5732 Diverticulitis of large intestine without perforation or abscess without bleeding: Secondary | ICD-10-CM

## 2014-03-09 DIAGNOSIS — K602 Anal fissure, unspecified: Secondary | ICD-10-CM

## 2014-03-09 NOTE — Progress Notes (Signed)
[name] [name] [birth year] [id number]  Note: This dictation was prepared with Dragon digital system. Any transcriptional errors that result from this procedure are unintentional.   History of Present Illness: This is a 58 year old white female with the episode of diverticulitis which occurred this summer and was documented on CT scan of the abdomen and pelvis. Patient responded to bowel rest and Cipro and Flagyl. She developed diarrhea and rectal pain and irritation which still bothers her and she attributes it to hemorrhoids. Her last colonoscopy in October 2014 showed mild diverticulosis. There is a family history of colon cancer in a parent.. Prior colonoscopies were in 2004 in 2009.  of . She has been using sitz baths for rectal irritation.    Past Medical History  Diagnosis Date  . Depression     stable on medication  . GERD (gastroesophageal reflux disease)   . PUD (peptic ulcer disease) 1980's  . Renal cyst     4.5-6 cm stable single  seen by uro in past  . Diverticulosis   . Hyperlipidemia     no meds needed  . Osteopenia   . Arthritis   . Gallstones   . IBS (irritable bowel syndrome)   . Diverticulitis 2015    Past Surgical History  Procedure Laterality Date  . Cholecystectomy  1987  . Wisdom tooth extraction  age 59 & 84    lower teeth done first and upper wisdom teeth last  . Irrigation and debridement sebaceous cyst Right 3/ 2014    chest wall  . Colonoscopy    . Upper gastrointestinal endoscopy      No Known Allergies  Family history and social history have been reviewed.  Review of Systems:   The remainder of the 10 point ROS is negative except as outlined in the H&P  Physical Exam: General Appearance Well developed, in no distress Eyes  Non icteric  HEENT  Non traumatic, normocephalic  Mouth No lesion, tongue papillated, no cheilosis Neck Supple without adenopathy, thyroid not enlarged, no carotid bruits, no JVD Lungs Clear to auscultation  bilaterally COR Normal S1, normal S2, regular rhythm, no murmur, quiet precordium Abdomen no tenderness. Normoactive bowel sounds no palpable Rectal and anoscopic exam reveals a 5 mm long anal fissure at 3:00. There is no bleeding but it is painful. There is no spasm.of rectal sphincter, No internal hemorrhoids Extremities  No pedal edema Skin No lesions Neurological Alert and oriented x 3 Psychological Normal mood and affect  Assessment and Plan:   Acute anal fissure. Documented  on today's endoscopic exam. It  is a result of  recent diarrhea. She will use 1% hydrocortisone cream 3 times a day and continue sitz baths.  Recent onset of diverticulitis which responded to bowel rest and antibiotics. She subsequently developed a yeast infection which is now under control. We have discussed high-fiber diet an adding Benefiber 1 tablespoon with each breakfast.. Recall colonoscopy in 2024 or earlier if she develops specific problems    Delfin Edis 03/09/2014

## 2014-03-09 NOTE — Patient Instructions (Addendum)
Please purchase the following medications over the counter and take as directed: Hydrocortisone 1% -Apply to rectum three times daily Benefiber-1 tablespoon on cereal daily  Dr Regis Bill

## 2014-03-09 NOTE — Telephone Encounter (Signed)
LMTCB about canceled appointment on 03/11/14 with D Hollice Espy

## 2014-03-11 ENCOUNTER — Ambulatory Visit: Payer: BC Managed Care – PPO | Admitting: Certified Nurse Midwife

## 2014-03-18 ENCOUNTER — Encounter: Payer: Self-pay | Admitting: Certified Nurse Midwife

## 2014-03-18 ENCOUNTER — Ambulatory Visit (INDEPENDENT_AMBULATORY_CARE_PROVIDER_SITE_OTHER): Payer: BC Managed Care – PPO | Admitting: Certified Nurse Midwife

## 2014-03-18 VITALS — BP 100/60 | HR 68 | Resp 16 | Ht 65.25 in | Wt 171.0 lb

## 2014-03-18 DIAGNOSIS — B373 Candidiasis of vulva and vagina: Secondary | ICD-10-CM

## 2014-03-18 DIAGNOSIS — N952 Postmenopausal atrophic vaginitis: Secondary | ICD-10-CM

## 2014-03-18 DIAGNOSIS — B3731 Acute candidiasis of vulva and vagina: Secondary | ICD-10-CM

## 2014-03-18 NOTE — Progress Notes (Signed)
[name] here for follow up of yeast vaginitis/vulvitis due to poly medication use treated with Diflucan and Aveeno Sitz bath initiated on 02/26/14. Completed all medication as directed.  Denies any symptoms of itching or burning or increase in vaginal discharge. Patient has not used any other medication except what she was instructed use. Saw Dr. Olevia Perches as recommended and had rectal fissure, treated, feeling much better. Patient feels cracked and slit areas are healing now. No other health issues.    O: Healthy WD,WN female Affect: normal Skin:warm and dry No lymph node enlargement Pelvic exam:EXTERNAL GENITALIA: atrophic appearing, with slit area at clitoris healing but still slightly pink, non tender, redness as resolved. Vagina: no vaginal exam done today due to slit area from fourchette all the way to perineal area healing, no redness, but still thinning appearance. No scaling or exudate noted. Perineal area: slit area to anal area healed with slight pink noted. No evidence of yeast. Declined wet prep. RECTUM: no hemorrhoid noted today.  A:Yeast vaginitis and dermatitis  Probably resolved Thinned skin area from previous overuse of medications with steroid base. Anal fissure under treatment  P: Discussed findings of area healing and no appearance of yeast presence. Continue to use Aveeno sitz bath if needed. Use Aveeno moisture to continue with healing of skin. Avoid any other medications or products. Can use coconut oil for vaginal dryness if needed.  Recheck per patient request 3 weeks, prn

## 2014-03-19 NOTE — Progress Notes (Signed)
Reviewed personally.  M. Suzanne Antasia Haider, MD.  

## 2014-03-23 ENCOUNTER — Telehealth: Payer: Self-pay | Admitting: *Deleted

## 2014-03-23 NOTE — Telephone Encounter (Signed)
[facility] has approved patient's omeprazole from 02/20/14-03/22/15. Case ID is [id number].

## 2014-04-12 ENCOUNTER — Ambulatory Visit: Payer: BC Managed Care – PPO | Admitting: Certified Nurse Midwife

## 2014-04-21 ENCOUNTER — Ambulatory Visit (INDEPENDENT_AMBULATORY_CARE_PROVIDER_SITE_OTHER): Payer: BC Managed Care – PPO | Admitting: Certified Nurse Midwife

## 2014-04-21 ENCOUNTER — Encounter: Payer: Self-pay | Admitting: Certified Nurse Midwife

## 2014-04-21 VITALS — BP 110/64 | HR 68 | Resp 16 | Ht 65.25 in | Wt 170.0 lb

## 2014-04-21 DIAGNOSIS — N951 Menopausal and female climacteric states: Secondary | ICD-10-CM

## 2014-04-21 NOTE — Progress Notes (Signed)
[name] here for follow up of thinned skin related to previous medication use  treated with Diflucan and aveeno sitz bath initiated on 02/26/14. Patient has continued to use Aveeno sitz bath for occasional itching with great success. Feels all the superficial tearing has resolved. Rectal fissure seen by Dr. Olevia Perches has resolved.  Denies any new vaginal symptoms.  Now is ready to try some coconut oil for vaginal dryness.    O: Healthy WD,WN female Affect: Normal Lymph nodes inguinal, no enlargement  Pelvic exam:EXTERNAL GENITALIA: normal appearing vulva with no masses, tenderness or lesions VAGINA: no abnormal discharge or lesions CERVIX: no lesions or cervical motion tenderness and normal UTERUS: normal non tender ADNEXA: no masses palpable and nontender  A:Normal pelvic exam Vaginal dryness will start with coconut oil 2 x weekly. Vaginal tears have resolved  P.Discussed finding of normal external and internal exam. Aveeno sitz bath prn for vaginal itching, no steroids. Call if any issues with using coconut oil.   RV prn

## 2014-04-23 ENCOUNTER — Ambulatory Visit (INDEPENDENT_AMBULATORY_CARE_PROVIDER_SITE_OTHER): Payer: BC Managed Care – PPO | Admitting: Internal Medicine

## 2014-04-23 ENCOUNTER — Encounter: Payer: Self-pay | Admitting: Internal Medicine

## 2014-04-23 VITALS — BP 134/70 | Temp 98.3°F | Wt 171.3 lb

## 2014-04-23 DIAGNOSIS — J019 Acute sinusitis, unspecified: Secondary | ICD-10-CM

## 2014-04-23 DIAGNOSIS — J40 Bronchitis, not specified as acute or chronic: Secondary | ICD-10-CM | POA: Diagnosis not present

## 2014-04-23 DIAGNOSIS — R062 Wheezing: Secondary | ICD-10-CM | POA: Diagnosis not present

## 2014-04-23 MED ORDER — ALBUTEROL SULFATE (2.5 MG/3ML) 0.083% IN NEBU
2.5000 mg | INHALATION_SOLUTION | Freq: Once | RESPIRATORY_TRACT | Status: AC
  Administered 2014-04-23: 2.5 mg via RESPIRATORY_TRACT

## 2014-04-23 MED ORDER — HYDROCODONE-HOMATROPINE 5-1.5 MG/5ML PO SYRP: 5.0000 mL | ORAL_SOLUTION | ORAL | Status: DC | PRN

## 2014-04-23 MED ORDER — AMOXICILLIN-POT CLAVULANATE 875-125 MG PO TABS: 1.0000 | ORAL_TABLET | Freq: Two times a day (BID) | ORAL | Status: DC

## 2014-04-23 NOTE — Patient Instructions (Signed)
Treating for sinusitis    Also  you have some wheezy bronchitis .Marland Kitchen   Cough med and antibiotic   Cough could last a few week s but should improve in the next 5 days or so   Sinusitis Sinusitis is redness, soreness, and inflammation of the paranasal sinuses. Paranasal sinuses are air pockets within the bones of your face (beneath the eyes, the middle of the forehead, or above the eyes). In healthy paranasal sinuses, mucus is able to drain out, and air is able to circulate through them by way of your nose. However, when your paranasal sinuses are inflamed, mucus and air can become trapped. This can allow bacteria and other germs to grow and cause infection. Sinusitis can develop quickly and last only a short time (acute) or continue over a long period (chronic). Sinusitis that lasts for more than 12 weeks is considered chronic.  CAUSES  Causes of sinusitis include:  Allergies.  Structural abnormalities, such as displacement of the cartilage that separates your nostrils (deviated septum), which can decrease the air flow through your nose and sinuses and affect sinus drainage.  Functional abnormalities, such as when the small hairs (cilia) that line your sinuses and help remove mucus do not work properly or are not present. SIGNS AND SYMPTOMS  Symptoms of acute and chronic sinusitis are the same. The primary symptoms are pain and pressure around the affected sinuses. Other symptoms include:  Upper toothache.  Earache.  Headache.  Bad breath.  Decreased sense of smell and taste.  A cough, which worsens when you are lying flat.  Fatigue.  Fever.  Thick drainage from your nose, which often is green and may contain pus (purulent).  Swelling and warmth over the affected sinuses. DIAGNOSIS  Your health care provider will perform a physical exam. During the exam, your health care provider may:  Look in your nose for signs of abnormal growths in your nostrils (nasal polyps).  Tap over  the affected sinus to check for signs of infection.  View the inside of your sinuses (endoscopy) using an imaging device that has a light attached (endoscope). If your health care provider suspects that you have chronic sinusitis, one or more of the following tests may be recommended:  Allergy tests.  Nasal culture. A sample of mucus is taken from your nose, sent to a lab, and screened for bacteria.  Nasal cytology. A sample of mucus is taken from your nose and examined by your health care provider to determine if your sinusitis is related to an allergy. TREATMENT  Most cases of acute sinusitis are related to a viral infection and will resolve on their own within 10 days. Sometimes medicines are prescribed to help relieve symptoms (pain medicine, decongestants, nasal steroid sprays, or saline sprays).  However, for sinusitis related to a bacterial infection, your health care provider will prescribe antibiotic medicines. These are medicines that will help kill the bacteria causing the infection.  Rarely, sinusitis is caused by a fungal infection. In theses cases, your health care provider will prescribe antifungal medicine. For some cases of chronic sinusitis, surgery is needed. Generally, these are cases in which sinusitis recurs more than 3 times per year, despite other treatments. HOME CARE INSTRUCTIONS   Drink plenty of water. Water helps thin the mucus so your sinuses can drain more easily.  Use a humidifier.  Inhale steam 3 to 4 times a day (for example, sit in the bathroom with the shower running).  Apply a warm, moist washcloth  to your face 3 to 4 times a day, or as directed by your health care provider.  Use saline nasal sprays to help moisten and clean your sinuses.  Take medicines only as directed by your health care provider.  If you were prescribed either an antibiotic or antifungal medicine, finish it all even if you start to feel better. SEEK IMMEDIATE MEDICAL CARE  IF:  You have increasing pain or severe headaches.  You have nausea, vomiting, or drowsiness.  You have swelling around your face.  You have vision problems.  You have a stiff neck.  You have difficulty breathing. MAKE SURE YOU:   Understand these instructions.  Will watch your condition.  Will get help right away if you are not doing well or get worse. Document Released: 05/07/2005 Document Revised: 09/21/2013 Document Reviewed: 05/22/2011 [facility] Patient Information 2015 [place], [place]. This information is not intended to replace advice given to you by your health care provider. Make sure you discuss any questions you have with your health care provider.

## 2014-04-23 NOTE — Progress Notes (Signed)
Pre visit review using our clinic review tool, if applicable. No additional management support is needed unless otherwise documented below in the visit note.   Chief Complaint  Patient presents with  . Cough    Started on Monday  . Nasal Congestion  . Generalized Body Aches  . Headache  . Wheezing    HPI:  Patient [name] [name]  comes in today for SDA for  new problem evaluation. Onset 5 days ago  Of above worsening sx  But has had  Thick  Copious nasal  Mucous for a week. Bloody and some yellow green   For a few weeks   Now worse ... Sounds like a rattel in chest hx and in am . Sob when coughs  No feve but head hurts to cough  . NO vd   No new rashes  ROS: See pertinent positives and negatives per HPI.tried mucinex  Mild lower abd tenderness  / if diverticulitis is coming back   Past Medical History  Diagnosis Date  . Depression     stable on medication  . GERD (gastroesophageal reflux disease)   . PUD (peptic ulcer disease) 1980's  . Renal cyst     4.5-6 cm stable single  seen by uro in past  . Diverticulosis   . Hyperlipidemia     no meds needed  . Osteopenia   . Arthritis   . Gallstones   . IBS (irritable bowel syndrome)   . Diverticulitis 2015    Family History  Problem Relation Age of Onset  . Rheum arthritis Father   . Pneumonia Father     deceased  . Colon cancer Father 68  . Heart attack Father   . Breast cancer Sister 31  . Breast cancer Paternal Grandmother 70  . Osteoarthritis Mother   . COPD Mother   . Pneumonia Mother   . Osteoporosis Mother   . Cancer Maternal Grandmother     ? GI  . Heart failure Maternal Grandfather   . Heart failure Paternal Grandfather   . Esophageal cancer Neg Hx   . Rectal cancer Neg Hx   . Stomach cancer Neg Hx     History   Social History  . Marital Status: Divorced    Spouse Name: N/A    Number of Children: 0  . Years of Education: N/A   Occupational History  . TEACHER    Social History Main Topics  .  Smoking status: Never Smoker   . Smokeless tobacco: Never Used  . Alcohol Use: Yes     Comment: occasionally  . Drug Use: No  . Sexual Activity: No   Other Topics Concern  . None   Social History Narrative   Copy.   Non smoker    Hh of 1 ... 2 cats    Single   G0P0   Mom is now in assisted living and she has moved into her old residence    Outpatient Encounter Prescriptions as of 04/23/2014  Medication Sig  . Cholecalciferol (VITAMIN D PO) Take 2,000 mg by mouth every other day.   Marland Kitchen FLUoxetine (PROZAC) 40 MG capsule   . Multiple Vitamins-Minerals (CENTRUM ULTRA WOMENS PO) Take 1 tablet by mouth every other day.   Marland Kitchen omeprazole (PRILOSEC) 20 MG capsule Take 1 capsule (20 mg total) by mouth 2 (two) times daily.  . Probiotic Product (PROBIOTIC PO) Take by mouth daily.  . propranolol (INDERAL) 10 MG tablet Take  10 mg by mouth as needed.   Marland Kitchen amoxicillin-clavulanate (AUGMENTIN) 875-125 MG per tablet Take 1 tablet by mouth every 12 (twelve) hours.  Marland Kitchen HYDROcodone-homatropine (HYCODAN) 5-1.5 MG/5ML syrup Take 5 mLs by mouth every 4 (four) hours as needed for cough. 1-2 tsp  . [EXPIRED] albuterol (PROVENTIL) (2.5 MG/3ML) 0.083% nebulizer solution 2.5 mg     EXAM:  BP 134/70 mmHg  Temp(Src) 98.3 F (36.8 C) (Oral)  Wt 171 lb 4.8 oz (77.701 kg)  LMP 11/18/2004  Body mass index is 28.3 kg/(m^2). WDWN in NAD  quiet respirations; very  congested  somewhat hoarse. Non toxic . HEENT: Normocephalic ;atraumatic , Eyes;  PERRL, EOMs  Full, lids and conjunctiva clear,,Ears: no deformities, canals nl, TM landmarks normal, Nose: no deformity or mucoid dc  congested;face minimally tender frontal area Mouth : OP clear without lesion or edema .cobblestoning  Neck: Supple without adenopathy or masses or bruits Chest:  Diffuse wheezes and rhonchi no rales  bs =  After nebulizer inc air flow but still present CV:  S1-S2 no gallops or murmurs  peripheral perfusion is normal Skin :nl perfusion and no acute rashes   ASSESSMENT AND PLAN:  Discussed the following assessment and plan:  Wheezy bronchitis  Acute sinusitis with symptoms greater than 10 days - prolonged uri with worsening upper sx  rx for sinusitis   Wheezing - Plan: albuterol (PROVENTIL) (2.5 MG/3ML) 0.083% nebulizer solution 2.5 mg Worsening after 2 weeks   Vs new illness  Empiric antibiotic and rest fluids  Fu if  persistent or progressive    Expectant management.  -Patient advised to return or notify health care team  if symptoms worsen ,persist or new concerns arise.  Patient Instructions  Treating for sinusitis    Also  you have some wheezy bronchitis .Marland Kitchen   Cough med and antibiotic   Cough could last a few week s but should improve in the next 5 days or so   Sinusitis Sinusitis is redness, soreness, and inflammation of the paranasal sinuses. Paranasal sinuses are air pockets within the bones of your face (beneath the eyes, the middle of the forehead, or above the eyes). In healthy paranasal sinuses, mucus is able to drain out, and air is able to circulate through them by way of your nose. However, when your paranasal sinuses are inflamed, mucus and air can become trapped. This can allow bacteria and other germs to grow and cause infection. Sinusitis can develop quickly and last only a short time (acute) or continue over a long period (chronic). Sinusitis that lasts for more than 12 weeks is considered chronic.  CAUSES  Causes of sinusitis include:  Allergies.  Structural abnormalities, such as displacement of the cartilage that separates your nostrils (deviated septum), which can decrease the air flow through your nose and sinuses and affect sinus drainage.  Functional abnormalities, such as when the small hairs (cilia) that line your sinuses and help remove mucus do not work properly or are not present. SIGNS AND SYMPTOMS  Symptoms of acute and chronic  sinusitis are the same. The primary symptoms are pain and pressure around the affected sinuses. Other symptoms include:  Upper toothache.  Earache.  Headache.  Bad breath.  Decreased sense of smell and taste.  A cough, which worsens when you are lying flat.  Fatigue.  Fever.  Thick drainage from your nose, which often is green and may contain pus (purulent).  Swelling and warmth over the affected sinuses. DIAGNOSIS  Your health care  provider will perform a physical exam. During the exam, your health care provider may:  Look in your nose for signs of abnormal growths in your nostrils (nasal polyps).  Tap over the affected sinus to check for signs of infection.  View the inside of your sinuses (endoscopy) using an imaging device that has a light attached (endoscope). If your health care provider suspects that you have chronic sinusitis, one or more of the following tests may be recommended:  Allergy tests.  Nasal culture. A sample of mucus is taken from your nose, sent to a lab, and screened for bacteria.  Nasal cytology. A sample of mucus is taken from your nose and examined by your health care provider to determine if your sinusitis is related to an allergy. TREATMENT  Most cases of acute sinusitis are related to a viral infection and will resolve on their own within 10 days. Sometimes medicines are prescribed to help relieve symptoms (pain medicine, decongestants, nasal steroid sprays, or saline sprays).  However, for sinusitis related to a bacterial infection, your health care provider will prescribe antibiotic medicines. These are medicines that will help kill the bacteria causing the infection.  Rarely, sinusitis is caused by a fungal infection. In theses cases, your health care provider will prescribe antifungal medicine. For some cases of chronic sinusitis, surgery is needed. Generally, these are cases in which sinusitis recurs more than 3 times per year, despite other  treatments. HOME CARE INSTRUCTIONS   Drink plenty of water. Water helps thin the mucus so your sinuses can drain more easily.  Use a humidifier.  Inhale steam 3 to 4 times a day (for example, sit in the bathroom with the shower running).  Apply a warm, moist washcloth to your face 3 to 4 times a day, or as directed by your health care provider.  Use saline nasal sprays to help moisten and clean your sinuses.  Take medicines only as directed by your health care provider.  If you were prescribed either an antibiotic or antifungal medicine, finish it all even if you start to feel better. SEEK IMMEDIATE MEDICAL CARE IF:  You have increasing pain or severe headaches.  You have nausea, vomiting, or drowsiness.  You have swelling around your face.  You have vision problems.  You have a stiff neck.  You have difficulty breathing. MAKE SURE YOU:   Understand these instructions.  Will watch your condition.  Will get help right away if you are not doing well or get worse. Document Released: 05/07/2005 Document Revised: 09/21/2013 Document Reviewed: 05/22/2011 [facility] Patient Information 2015 [place], [place]. This information is not intended to replace advice given to you by your health care provider. Make sure you discuss any questions you have with your health care provider.      Standley Brooking. Panosh M.D.

## 2014-04-28 NOTE — Progress Notes (Signed)
Reviewed personally.  M. Suzanne Marrissa Dai, MD.  

## 2014-08-11 ENCOUNTER — Other Ambulatory Visit: Payer: BC Managed Care – PPO

## 2014-08-11 ENCOUNTER — Other Ambulatory Visit (INDEPENDENT_AMBULATORY_CARE_PROVIDER_SITE_OTHER): Payer: BC Managed Care – PPO

## 2014-08-11 DIAGNOSIS — Z0000 Encounter for general adult medical examination without abnormal findings: Secondary | ICD-10-CM | POA: Diagnosis not present

## 2014-08-11 LAB — LIPID PANEL
Cholesterol: 210 mg/dL — ABNORMAL HIGH (ref 0–200)
HDL: 74 mg/dL (ref >39.00)
LDL Cholesterol: 125 mg/dL — ABNORMAL HIGH (ref 0–99)
NonHDL: 136
Total CHOL/HDL Ratio: 3
Triglycerides: 56 mg/dL (ref 0.0–149.0)
VLDL: 11.2 mg/dL (ref 0.0–40.0)

## 2014-08-11 LAB — BASIC METABOLIC PANEL
BUN: 19 mg/dL (ref 6–23)
CO2: 31 mEq/L (ref 19–32)
Calcium: 9.7 mg/dL (ref 8.4–10.5)
Chloride: 102 mEq/L (ref 96–112)
Creatinine, Ser: 0.77 mg/dL (ref 0.40–1.20)
GFR: 81.38 mL/min (ref >60.00)
Glucose, Bld: 96 mg/dL (ref 70–99)
Potassium: 4.7 mEq/L (ref 3.5–5.1)
Sodium: 139 mEq/L (ref 135–145)

## 2014-08-11 LAB — CBC WITH DIFFERENTIAL/PLATELET
Basophils Absolute: 0.1 10*3/uL (ref 0.0–0.1)
Basophils Relative: 1.7 % (ref 0.0–3.0)
Eosinophils Absolute: 0.5 10*3/uL (ref 0.0–0.7)
Eosinophils Relative: 8 % — ABNORMAL HIGH (ref 0.0–5.0)
HCT: 40.9 % (ref 36.0–46.0)
Hemoglobin: 13.6 g/dL (ref 12.0–15.0)
Lymphocytes Relative: 29.6 % (ref 12.0–46.0)
Lymphs Abs: 1.8 10*3/uL (ref 0.7–4.0)
MCHC: 33.1 g/dL (ref 30.0–36.0)
MCV: 87.6 fl (ref 78.0–100.0)
Monocytes Absolute: 0.5 10*3/uL (ref 0.1–1.0)
Monocytes Relative: 8.4 % (ref 3.0–12.0)
Neutro Abs: 3.2 10*3/uL (ref 1.4–7.7)
Neutrophils Relative %: 52.3 % (ref 43.0–77.0)
Platelets: 265 10*3/uL (ref 150.0–400.0)
RBC: 4.67 Mil/uL (ref 3.87–5.11)
RDW: 14.2 % (ref 11.5–15.5)
WBC: 6.1 10*3/uL (ref 4.0–10.5)

## 2014-08-11 LAB — TSH: TSH: 1.38 u[IU]/mL (ref 0.35–4.50)

## 2014-08-11 LAB — HEPATIC FUNCTION PANEL
ALT: 17 U/L (ref 0–35)
AST: 21 U/L (ref 0–37)
Albumin: 4.1 g/dL (ref 3.5–5.2)
Alkaline Phosphatase: 124 U/L — ABNORMAL HIGH (ref 39–117)
Bilirubin, Direct: 0 mg/dL (ref 0.0–0.3)
Total Bilirubin: 0.5 mg/dL (ref 0.2–1.2)
Total Protein: 7.3 g/dL (ref 6.0–8.3)

## 2014-08-17 ENCOUNTER — Other Ambulatory Visit: Payer: Self-pay | Admitting: Internal Medicine

## 2014-08-18 ENCOUNTER — Encounter: Payer: Self-pay | Admitting: Internal Medicine

## 2014-08-18 ENCOUNTER — Ambulatory Visit (INDEPENDENT_AMBULATORY_CARE_PROVIDER_SITE_OTHER): Payer: BC Managed Care – PPO | Admitting: Internal Medicine

## 2014-08-18 VITALS — BP 120/62 | Temp 98.4°F | Ht 65.0 in | Wt 169.1 lb

## 2014-08-18 DIAGNOSIS — Z0000 Encounter for general adult medical examination without abnormal findings: Secondary | ICD-10-CM | POA: Diagnosis not present

## 2014-08-18 DIAGNOSIS — R748 Abnormal levels of other serum enzymes: Secondary | ICD-10-CM | POA: Diagnosis not present

## 2014-08-18 NOTE — Progress Notes (Signed)
Pre visit review using our clinic review tool, if applicable. No additional management support is needed unless otherwise documented below in the visit note.  Chief Complaint  Patient presents with  . Annual Exam    HPI: Patient  [name] [name]  59 y.o. comes in today for Preventive Health Care visit  Has appt  Gyne   On ppi :for a awhile fo indigestion hasnt tried off  Generally well taking vit d 1000 iu no recent new meds or illnss some stress   Health Maintenance  Topic Date Due  . PAP SMEAR  04/21/2011  . INFLUENZA VACCINE  08/19/2014 (Originally 12/19/2013)  . HIV Screening  07/20/2015 (Originally 01/05/1971)  . MAMMOGRAM  12/26/2015  . COLONOSCOPY  02/27/2018  . TETANUS/TDAP  05/14/2023   Health Maintenance Review LIFESTYLE:  Exercise:  Walks ome  And at work  Tobacco/ETS: no Alcohol: rare  Sugar beverages: 1 diet soda  2-3 coffee. 1/2 sugar tea. Sleep: try for 7-8  Drug use: no Bone density:  Colonoscopy: per hx  Due 2019   5 year PAP:  Last summer . ? Due this year Up to date   on healthcare parameters   MAMMO:utd   ROS:  GEN/ HEENT: No fever, significant weight changes sweats headaches vision problems hearing changes, CV/ PULM; No chest pain shortness of breath cough, syncope,edema  change in exercise tolerance. GI /GU: No adominal pain, vomiting, change in bowel habits. No blood in the stool. No significant GU symptoms. SKIN/HEME: ,no acute skin rashes suspicious lesions or bleeding. No lymphadenopathy, nodules, masses.  NEURO/ PSYCH:  No neurologic signs such as weakness numbness. No depression anxiety. IMM/ Allergy: No unusual infections.  Allergy .   REST of 12 system review negative except as per HPI   Past Medical History  Diagnosis Date  . Depression     stable on medication  . GERD (gastroesophageal reflux disease)   . PUD (peptic ulcer disease) 1980's  . Renal cyst     4.5-6 cm stable single  seen by uro in past  . Diverticulosis   .  Hyperlipidemia     no meds needed  . Osteopenia   . Arthritis   . Gallstones   . IBS (irritable bowel syndrome)   . Diverticulitis 2015    Past Surgical History  Procedure Laterality Date  . Cholecystectomy  1987  . Wisdom tooth extraction  age 79 & 6    lower teeth done first and upper wisdom teeth last  . Irrigation and debridement sebaceous cyst Right 3/ 2014    chest wall  . Colonoscopy    . Upper gastrointestinal endoscopy      Family History  Problem Relation Age of Onset  . Rheum arthritis Father   . Pneumonia Father     deceased  . Colon cancer Father 72  . Heart attack Father   . Breast cancer Sister 63  . Breast cancer Paternal Grandmother 43  . Osteoarthritis Mother   . COPD Mother   . Pneumonia Mother   . Osteoporosis Mother   . Cancer Maternal Grandmother     ? GI  . Heart failure Maternal Grandfather   . Heart failure Paternal Grandfather   . Esophageal cancer Neg Hx   . Rectal cancer Neg Hx   . Stomach cancer Neg Hx     History   Social History  . Marital Status: Divorced    Spouse Name: N/A  . Number of Children: 0  .  Years of Education: N/A   Occupational History  . TEACHER    Social History Main Topics  . Smoking status: Never Smoker   . Smokeless tobacco: Never Used  . Alcohol Use: Yes     Comment: occasionally  . Drug Use: No  . Sexual Activity: No   Other Topics Concern  . None   Social History Narrative   Copy.   Non smoker    Hh of 1 ... 2 cats    Single   G0P0   Mom is now in assisted living and she has moved into her old residence    Outpatient Encounter Prescriptions as of 08/18/2014  Medication Sig  . Cholecalciferol (VITAMIN D PO) Take 2,000 mg by mouth every other day.   Marland Kitchen FLUoxetine (PROZAC) 40 MG capsule   . Multiple Vitamins-Minerals (CENTRUM ULTRA WOMENS PO) Take 1 tablet by mouth every other day.   Marland Kitchen omeprazole (PRILOSEC) 20 MG capsule TAKE ONE CAPSULE  BY MOUTH TWICE DAILY   . Probiotic Product (PROBIOTIC PO) Take by mouth daily.  . propranolol (INDERAL) 10 MG tablet Take 10 mg by mouth as needed.   . [DISCONTINUED] amoxicillin-clavulanate (AUGMENTIN) 875-125 MG per tablet Take 1 tablet by mouth every 12 (twelve) hours.  . [DISCONTINUED] HYDROcodone-homatropine (HYCODAN) 5-1.5 MG/5ML syrup Take 5 mLs by mouth every 4 (four) hours as needed for cough. 1-2 tsp  . [DISCONTINUED] omeprazole (PRILOSEC) 20 MG capsule Take 1 capsule (20 mg total) by mouth 2 (two) times daily.    EXAM:  BP 120/62 mmHg  Temp(Src) 98.4 F (36.9 C) (Oral)  Ht 5\' 5"  (1.651 m)  Wt 169 lb 1.6 oz (76.703 kg)  BMI 28.14 kg/m2  LMP 11/18/2004  Body mass index is 28.14 kg/(m^2).  Physical Exam: Vital signs reviewed ESP:QZRA is a well-developed well-nourished alert cooperative    who appearsr stated age in no acute distress.  HEENT: normocephalic atraumatic , Eyes: PERRL EOM's full, conjunctiva clear, Nares: paten,t no deformity discharge or tenderness., Ears: no deformity EAC's clear TMs with normal landmarks. Mouth: clear OP, no lesions, edema.  Moist mucous membranes. Dentition in adequate repair. NECK: supple without masses, thyromegaly or bruits. CHEST/PULM:  Clear to auscultation and percussion breath sounds equal no wheeze , rales or rhonchi. No chest wall deformities or tenderness. Breast: normal by inspection . No dimpling, discharge, masses, tenderness or discharge . CV: PMI is nondisplaced, S1 S2 no gallops, murmurs, rubs. Peripheral pulses are full without delay.No JVD .  ABDOMEN: Bowel sounds normal nontender  No guard or rebound, no hepato splenomegal no CVA tenderness.  No hernia. Extremtities:  No clubbing cyanosis or edema, no acute joint swelling or redness no focal atrophy some vv no ulceration NEURO:  Oriented x3, cranial nerves 3-12 appear to be intact, no obvious focal weakness,gait within normal limits no abnormal reflexes or asymmetrical SKIN:  No acute rashes normal turgor, color, no bruising or petechiae. PSYCH: Oriented, good eye contact, no obvious depression anxiety, cognition and judgment appear normal. LN: no cervical axillary inguinal adenopathy  Lab Results  Component Value Date   WBC 6.1 08/11/2014   HGB 13.6 08/11/2014   HCT 40.9 08/11/2014   PLT 265.0 08/11/2014   GLUCOSE 96 08/11/2014   CHOL 210* 08/11/2014   TRIG 56.0 08/11/2014   HDL 74.00 08/11/2014   LDLDIRECT 132.7 03/24/2013   LDLCALC 125* 08/11/2014   ALT 17 08/11/2014   AST 21 08/11/2014   NA 139 08/11/2014  K 4.7 08/11/2014   CL 102 08/11/2014   CREATININE 0.77 08/11/2014   BUN 19 08/11/2014   CO2 31 08/11/2014   TSH 1.38 08/11/2014    ASSESSMENT AND PLAN:  Discussed the following assessment and plan:  Visit for preventive health examination - utd declined flu vaccine  Elevated alkaline phosphatase measurement - mild uncertain significance repeat withggt a"nctd and vit d - Plan: Alkaline phosphatase, Gamma GT, Nucleotidase, 5', blood, Vit D  25 hydroxy (rtn osteoporosis monitoring)  Patient Care Team: Burnis Medin, MD as PCP - General Antonietta Barcelona, FNP as Nurse Practitioner (Nurse Practitioner) Rolan Bucco, MD as Consulting Physician (Urology) Sheralyn Boatman, MD as Consulting Physician (Psychiatry) Lafayette Dragon, MD as Consulting Physician (Gastroenterology) Patient Instructions   Continue lifestyle intervention healthy eating and exercise . Healthy lifestyle includes : At least 150 minutes of exercise weeks  , weight at healthy levels, which is usually   BMI 19-25. Avoid trans fats and processed foods;  Increase fresh fruits and veges to 5 servings per day. And avoid sweet beverages including tea and juice. Mediterranean diet with olive oil and nuts have been noted to be heart and brain healthy . Avoid tobacco products . Limit  alcohol to  7 per week for women and 14 servings for men.  Get adequate sleep . Wear seat belts  . Don't text and drive .   Check labs in about a month no ov needed      Why follow it? Research shows. . Those who follow the Mediterranean diet have a reduced risk of heart disease  . The diet is associated with a reduced incidence of Parkinson's and Alzheimer's diseases . People following the diet may have longer life expectancies and lower rates of chronic diseases  . The Dietary Guidelines for Americans recommends the Mediterranean diet as an eating plan to promote health and prevent disease  What Is the Mediterranean Diet?  . Healthy eating plan based on typical foods and recipes of Mediterranean-style cooking . The diet is primarily a plant based diet; these foods should make up a majority of meals   Starches - Plant based foods should make up a majority of meals - They are an important sources of vitamins, minerals, energy, antioxidants, and fiber - Choose whole grains, foods high in fiber and minimally processed items  - Typical grain sources include wheat, oats, barley, corn, brown rice, bulgar, farro, millet, polenta, couscous  - Various types of beans include chickpeas, lentils, fava beans, black beans, white beans   Fruits  Veggies - Large quantities of antioxidant rich fruits & veggies; 6 or more servings  - Vegetables can be eaten raw or lightly drizzled with oil and cooked  - Vegetables common to the traditional Mediterranean Diet include: artichokes, arugula, beets, broccoli, brussel sprouts, cabbage, carrots, celery, collard greens, cucumbers, eggplant, kale, leeks, lemons, lettuce, mushrooms, okra, onions, peas, peppers, potatoes, pumpkin, radishes, rutabaga, shallots, spinach, sweet potatoes, turnips, zucchini - Fruits common to the Mediterranean Diet include: apples, apricots, avocados, cherries, clementines, dates, figs, grapefruits, grapes, melons, nectarines, oranges, peaches, pears, pomegranates, strawberries, tangerines  Fats - Replace butter and margarine with  healthy oils, such as olive oil, canola oil, and tahini  - Limit nuts to no more than a handful a day  - Nuts include walnuts, almonds, pecans, pistachios, pine nuts  - Limit or avoid candied, honey roasted or heavily salted nuts - Olives are central to the Mediterranean diet - can be eaten whole  or used in a variety of dishes   Meats Protein - Limiting red meat: no more than a few times a month - When eating red meat: choose lean cuts and keep the portion to the size of deck of cards - Eggs: approx. 0 to 4 times a week  - Fish and lean poultry: at least 2 a week  - Healthy protein sources include, chicken, [place], lean beef, lamb - Increase intake of seafood such as tuna, salmon, trout, mackerel, shrimp, scallops - Avoid or limit high fat processed meats such as sausage and bacon  Dairy - Include moderate amounts of low fat dairy products  - Focus on healthy dairy such as fat free yogurt, skim milk, low or reduced fat cheese - Limit dairy products higher in fat such as whole or 2% milk, cheese, ice cream  Alcohol - Moderate amounts of red wine is ok  - No more than 5 oz daily for women (all ages) and men older than age 26  - No more than 10 oz of wine daily for men younger than 28  Other - Limit sweets and other desserts  - Use herbs and spices instead of salt to flavor foods  - Herbs and spices common to the traditional Mediterranean Diet include: basil, bay leaves, chives, cloves, cumin, fennel, garlic, lavender, marjoram, mint, oregano, parsley, pepper, rosemary, sage, savory, sumac, tarragon, thyme   It's not just a diet, it's a lifestyle:  . The Mediterranean diet includes lifestyle factors typical of those in the region  . Foods, drinks and meals are best eaten with others and savored . Daily physical activity is important for overall good health . This could be strenuous exercise like running and aerobics . This could also be more leisurely activities such as walking, housework,  yard-work, or taking the stairs . Moderation is the key; a balanced and healthy diet accommodates most foods and drinks . Consider portion sizes and frequency of consumption of certain foods   Meal Ideas & Options:  . Breakfast:  o Whole wheat toast or whole wheat English muffins with peanut butter & hard boiled egg o Steel cut oats topped with apples & cinnamon and skim milk  o Fresh fruit: banana, strawberries, melon, berries, peaches  o Smoothies: strawberries, bananas, greek yogurt, peanut butter o Low fat greek yogurt with blueberries and granola  o Egg white omelet with spinach and mushrooms o Breakfast couscous: whole wheat couscous, apricots, skim milk, cranberries  . Sandwiches:  o Hummus and grilled vegetables (peppers, zucchini, squash) on whole wheat bread   o Grilled chicken on whole wheat pita with lettuce, tomatoes, cucumbers or tzatziki  o Tuna salad on whole wheat bread: tuna salad made with greek yogurt, olives, red peppers, capers, green onions o Garlic rosemary lamb pita: lamb sauted with garlic, rosemary, salt & pepper; add lettuce, cucumber, greek yogurt to pita - flavor with lemon juice and black pepper  . Seafood:  o Mediterranean grilled salmon, seasoned with garlic, basil, parsley, lemon juice and black pepper o Shrimp, lemon, and spinach whole-grain pasta salad made with low fat greek yogurt  o Seared scallops with lemon orzo  o Seared tuna steaks seasoned salt, pepper, coriander topped with tomato mixture of olives, tomatoes, olive oil, minced garlic, parsley, green onions and cappers  . Meats:  o Herbed greek chicken salad with kalamata olives, cucumber, feta  o Red bell peppers stuffed with spinach, bulgur, lean ground beef (or lentils) & topped with feta  o Kebabs: skewers of chicken, tomatoes, onions, zucchini, squash  o [place] burgers: made with red onions, mint, dill, lemon juice, feta cheese topped with roasted red peppers . Vegetarian o Cucumber  salad: cucumbers, artichoke hearts, celery, red onion, feta cheese, tossed in olive oil & lemon juice  o Hummus and whole grain pita points with a greek salad (lettuce, tomato, feta, olives, cucumbers, red onion) o Lentil soup with celery, carrots made with vegetable broth, garlic, salt and pepper  o Tabouli salad: parsley, bulgur, mint, scallions, cucumbers, tomato, radishes, lemon juice, olive oil, salt and pepper.         Standley Brooking. Panosh M.D.

## 2014-08-18 NOTE — Patient Instructions (Addendum)
Continue lifestyle intervention healthy eating and exercise . Healthy lifestyle includes : At least 150 minutes of exercise weeks  , weight at healthy levels, which is usually   BMI 19-25. Avoid trans fats and processed foods;  Increase fresh fruits and veges to 5 servings per day. And avoid sweet beverages including tea and juice. Mediterranean diet with olive oil and nuts have been noted to be heart and brain healthy . Avoid tobacco products . Limit  alcohol to  7 per week for women and 14 servings for men.  Get adequate sleep . Wear seat belts . Don't text and drive .   Check labs in about a month no ov needed      Why follow it? Research shows. . Those who follow the Mediterranean diet have a reduced risk of heart disease  . The diet is associated with a reduced incidence of Parkinson's and Alzheimer's diseases . People following the diet may have longer life expectancies and lower rates of chronic diseases  . The Dietary Guidelines for Americans recommends the Mediterranean diet as an eating plan to promote health and prevent disease  What Is the Mediterranean Diet?  . Healthy eating plan based on typical foods and recipes of Mediterranean-style cooking . The diet is primarily a plant based diet; these foods should make up a majority of meals   Starches - Plant based foods should make up a majority of meals - They are an important sources of vitamins, minerals, energy, antioxidants, and fiber - Choose whole grains, foods high in fiber and minimally processed items  - Typical grain sources include wheat, oats, barley, corn, brown rice, bulgar, farro, millet, polenta, couscous  - Various types of beans include chickpeas, lentils, fava beans, black beans, white beans   Fruits  Veggies - Large quantities of antioxidant rich fruits & veggies; 6 or more servings  - Vegetables can be eaten raw or lightly drizzled with oil and cooked  - Vegetables common to the traditional Mediterranean  Diet include: artichokes, arugula, beets, broccoli, brussel sprouts, cabbage, carrots, celery, collard greens, cucumbers, eggplant, kale, leeks, lemons, lettuce, mushrooms, okra, onions, peas, peppers, potatoes, pumpkin, radishes, rutabaga, shallots, spinach, sweet potatoes, turnips, zucchini - Fruits common to the Mediterranean Diet include: apples, apricots, avocados, cherries, clementines, dates, figs, grapefruits, grapes, melons, nectarines, oranges, peaches, pears, pomegranates, strawberries, tangerines  Fats - Replace butter and margarine with healthy oils, such as olive oil, canola oil, and tahini  - Limit nuts to no more than a handful a day  - Nuts include walnuts, almonds, pecans, pistachios, pine nuts  - Limit or avoid candied, honey roasted or heavily salted nuts - Olives are central to the [employer] - can be eaten whole or used in a variety of dishes   Meats Protein - Limiting red meat: no more than a few times a month - When eating red meat: choose lean cuts and keep the portion to the size of deck of cards - Eggs: approx. 0 to 4 times a week  - Fish and lean poultry: at least 2 a week  - Healthy protein sources include, chicken, [place], lean beef, lamb - Increase intake of seafood such as tuna, salmon, trout, mackerel, shrimp, scallops - Avoid or limit high fat processed meats such as sausage and bacon  Dairy - Include moderate amounts of low fat dairy products  - Focus on healthy dairy such as fat free yogurt, skim milk, low or reduced fat cheese - Limit dairy products  higher in fat such as whole or 2% milk, cheese, ice cream  Alcohol - Moderate amounts of red wine is ok  - No more than 5 oz daily for women (all ages) and men older than age 75  - No more than 10 oz of wine daily for men younger than 24  Other - Limit sweets and other desserts  - Use herbs and spices instead of salt to flavor foods  - Herbs and spices common to the traditional Mediterranean Diet include:  basil, bay leaves, chives, cloves, cumin, fennel, garlic, lavender, marjoram, mint, oregano, parsley, pepper, rosemary, sage, savory, sumac, tarragon, thyme   It's not just a diet, it's a lifestyle:  . The Mediterranean diet includes lifestyle factors typical of those in the region  . Foods, drinks and meals are best eaten with others and savored . Daily physical activity is important for overall good health . This could be strenuous exercise like running and aerobics . This could also be more leisurely activities such as walking, housework, yard-work, or taking the stairs . Moderation is the key; a balanced and healthy diet accommodates most foods and drinks . Consider portion sizes and frequency of consumption of certain foods   Meal Ideas & Options:  . Breakfast:  o Whole wheat toast or whole wheat English muffins with peanut butter & hard boiled egg o Steel cut oats topped with apples & cinnamon and skim milk  o Fresh fruit: banana, strawberries, melon, berries, peaches  o Smoothies: strawberries, bananas, greek yogurt, peanut butter o Low fat greek yogurt with blueberries and granola  o Egg white omelet with spinach and mushrooms o Breakfast couscous: whole wheat couscous, apricots, skim milk, cranberries  . Sandwiches:  o Hummus and grilled vegetables (peppers, zucchini, squash) on whole wheat bread   o Grilled chicken on whole wheat pita with lettuce, tomatoes, cucumbers or tzatziki  o Tuna salad on whole wheat bread: tuna salad made with greek yogurt, olives, red peppers, capers, green onions o Garlic rosemary lamb pita: lamb sauted with garlic, rosemary, salt & pepper; add lettuce, cucumber, greek yogurt to pita - flavor with lemon juice and black pepper  . Seafood:  o Mediterranean grilled salmon, seasoned with garlic, basil, parsley, lemon juice and black pepper o Shrimp, lemon, and spinach whole-grain pasta salad made with low fat greek yogurt  o Seared scallops with lemon  orzo  o Seared tuna steaks seasoned salt, pepper, coriander topped with tomato mixture of olives, tomatoes, olive oil, minced garlic, parsley, green onions and cappers  . Meats:  o Herbed greek chicken salad with kalamata olives, cucumber, feta  o Red bell peppers stuffed with spinach, bulgur, lean ground beef (or lentils) & topped with feta   o Kebabs: skewers of chicken, tomatoes, onions, zucchini, squash  o [place] burgers: made with red onions, mint, dill, lemon juice, feta cheese topped with roasted red peppers . Vegetarian o Cucumber salad: cucumbers, artichoke hearts, celery, red onion, feta cheese, tossed in olive oil & lemon juice  o Hummus and whole grain pita points with a greek salad (lettuce, tomato, feta, olives, cucumbers, red onion) o Lentil soup with celery, carrots made with vegetable broth, garlic, salt and pepper  o Tabouli salad: parsley, bulgur, mint, scallions, cucumbers, tomato, radishes, lemon juice, olive oil, salt and pepper.

## 2014-09-16 ENCOUNTER — Other Ambulatory Visit (INDEPENDENT_AMBULATORY_CARE_PROVIDER_SITE_OTHER): Payer: BC Managed Care – PPO

## 2014-09-16 DIAGNOSIS — R748 Abnormal levels of other serum enzymes: Secondary | ICD-10-CM | POA: Diagnosis not present

## 2014-09-16 LAB — ALKALINE PHOSPHATASE: Alkaline Phosphatase: 133 U/L — ABNORMAL HIGH (ref 39–117)

## 2014-09-16 LAB — GAMMA GT: GGT: 12 U/L (ref 7–51)

## 2014-09-16 LAB — VITAMIN D 25 HYDROXY (VIT D DEFICIENCY, FRACTURES): VITD: 45.23 ng/mL (ref 30.00–100.00)

## 2014-09-22 LAB — NUCLEOTIDASE, 5', BLOOD: 5-Nucleotidase: 4 U/L (ref <11)

## 2014-09-23 ENCOUNTER — Telehealth: Payer: Self-pay | Admitting: Internal Medicine

## 2014-09-23 NOTE — Telephone Encounter (Signed)
Pt would like a call back about lab results ..  °

## 2014-09-24 NOTE — Telephone Encounter (Signed)
Left a message informing the pt that [name] [name] has not looked at her lab results yet.  Instructed her to keep looking at [place] for a message from [facility] and I will call if needed.

## 2014-09-26 ENCOUNTER — Encounter: Payer: Self-pay | Admitting: Internal Medicine

## 2014-09-26 DIAGNOSIS — R748 Abnormal levels of other serum enzymes: Secondary | ICD-10-CM | POA: Insufficient documentation

## 2014-09-28 ENCOUNTER — Other Ambulatory Visit: Payer: Self-pay | Admitting: Family Medicine

## 2014-09-28 DIAGNOSIS — R748 Abnormal levels of other serum enzymes: Secondary | ICD-10-CM

## 2014-11-19 ENCOUNTER — Other Ambulatory Visit: Payer: Self-pay | Admitting: Nurse Practitioner

## 2014-11-19 DIAGNOSIS — Z1231 Encounter for screening mammogram for malignant neoplasm of breast: Secondary | ICD-10-CM

## 2014-11-30 ENCOUNTER — Other Ambulatory Visit (INDEPENDENT_AMBULATORY_CARE_PROVIDER_SITE_OTHER): Payer: BC Managed Care – PPO

## 2014-11-30 DIAGNOSIS — R748 Abnormal levels of other serum enzymes: Secondary | ICD-10-CM

## 2014-11-30 LAB — ALKALINE PHOSPHATASE: Alkaline Phosphatase: 121 U/L — ABNORMAL HIGH (ref 39–117)

## 2014-12-02 LAB — PTH, INTACT AND CALCIUM
Calcium: 9.8 mg/dL (ref 8.4–10.5)
PTH: 40 pg/mL (ref 14–64)

## 2014-12-29 ENCOUNTER — Ambulatory Visit: Payer: BC Managed Care – PPO

## 2015-01-04 ENCOUNTER — Encounter: Payer: Self-pay | Admitting: Nurse Practitioner

## 2015-01-04 ENCOUNTER — Ambulatory Visit (INDEPENDENT_AMBULATORY_CARE_PROVIDER_SITE_OTHER): Payer: BC Managed Care – PPO | Admitting: Nurse Practitioner

## 2015-01-04 VITALS — BP 102/60 | HR 80 | Resp 16 | Ht 65.25 in | Wt 170.0 lb

## 2015-01-04 DIAGNOSIS — Z0000 Encounter for general adult medical examination without abnormal findings: Secondary | ICD-10-CM | POA: Diagnosis not present

## 2015-01-04 DIAGNOSIS — Z01419 Encounter for gynecological examination (general) (routine) without abnormal findings: Secondary | ICD-10-CM

## 2015-01-04 DIAGNOSIS — N760 Acute vaginitis: Secondary | ICD-10-CM

## 2015-01-04 NOTE — Progress Notes (Signed)
Encounter reviewed by Dr. Brook Amundson C. Silva.  

## 2015-01-04 NOTE — Patient Instructions (Addendum)

## 2015-01-04 NOTE — Progress Notes (Signed)
59 y.o. G0P0 Divorced  Caucasian Fe here for annual exam.  No vaso symptoms.  Using coconut oil to labia prn.  Some labial itching maybe related to infection.  Mother in the hospital x 6 since 07/19/14.  She has CHF and COPD.    Patient's last menstrual period was 11/18/2004.          Sexually active: No.  The current method of family planning is post menopausal status.    Exercising: Yes.    Walking 2x weekly Smoker:  no  Health Maintenance: Pap:  08/2011 neg. HR HPV:neg MMG:  12/25/13 BIRADS1:Neg Colonoscopy: 02/2013 Diverticulitis - Repeat 5 years  BMD: 10/2009 Osteopenia   TDaP:  2014  Shingles:  Not yet Labs: PCP   reports that she has never smoked. She has never used smokeless tobacco. She reports that she drinks alcohol. She reports that she does not use illicit drugs.  Past Medical History  Diagnosis Date  . Depression     stable on medication  . GERD (gastroesophageal reflux disease)   . PUD (peptic ulcer disease) 1980's  . Renal cyst     4.5-6 cm stable single  seen by uro in past  . Diverticulosis   . Hyperlipidemia     no meds needed  . Osteopenia   . Arthritis   . Gallstones   . IBS (irritable bowel syndrome)   . Diverticulitis 2015    Past Surgical History  Procedure Laterality Date  . Cholecystectomy  1987  . Wisdom tooth extraction  age 56 & 50    lower teeth done first and upper wisdom teeth last  . Irrigation and debridement sebaceous cyst Right 3/ 2014    chest wall  . Colonoscopy    . Upper gastrointestinal endoscopy      Current Outpatient Prescriptions  Medication Sig Dispense Refill  . Cholecalciferol (VITAMIN D PO) Take 2,000 mg by mouth every other day.     Marland Kitchen FLUoxetine (PROZAC) 40 MG capsule Take 40 mg by mouth daily.     . Multiple Vitamins-Minerals (CENTRUM ULTRA WOMENS PO) Take 1 tablet by mouth every other day.     Marland Kitchen omeprazole (PRILOSEC) 20 MG capsule TAKE ONE CAPSULE BY MOUTH TWICE DAILY  60 capsule 9  . Probiotic Product (PROBIOTIC PO)  Take by mouth daily.    . propranolol (INDERAL) 10 MG tablet Take 10 mg by mouth as needed.      No current facility-administered medications for this visit.    Family History  Problem Relation Age of Onset  . Rheum arthritis Father   . Pneumonia Father     deceased  . Colon cancer Father 59  . Heart attack Father   . Breast cancer Sister 70  . Breast cancer Paternal Grandmother 76  . Osteoarthritis Mother   . COPD Mother   . Pneumonia Mother   . Osteoporosis Mother   . Cancer Maternal Grandmother     ? GI  . Heart failure Maternal Grandfather   . Heart failure Paternal Grandfather   . Esophageal cancer Neg Hx   . Rectal cancer Neg Hx   . Stomach cancer Neg Hx     ROS:  Pertinent items are noted in HPI.  Otherwise, a comprehensive ROS was negative.  Exam:   BP 102/60 mmHg  Pulse 80  Resp 16  Ht 5' 5.25" (1.657 m)  Wt 170 lb (77.111 kg)  BMI 28.08 kg/m2  LMP 11/18/2004 Height: 5' 5.25" (165.7 cm) Ht Readings  from Last 3 Encounters:  01/04/15 5' 5.25" (1.657 m)  08/18/14 5\' 5"  (1.651 m)  04/21/14 5' 5.25" (1.657 m)    General appearance: alert, cooperative and appears stated age Head: Normocephalic, without obvious abnormality, atraumatic Neck: no adenopathy, supple, symmetrical, trachea midline and thyroid normal to inspection and palpation Lungs: clear to auscultation bilaterally Breasts: normal appearance, no masses or tenderness Heart: regular rate and rhythm Abdomen: soft, non-tender; no masses,  no organomegaly Extremities: extremities normal, atraumatic, no cyanosis or edema Skin: Skin color, texture, turgor normal. No rashes or lesions Lymph nodes: Cervical, supraclavicular, and axillary nodes normal. No abnormal inguinal nodes palpated Neurologic: Grossly normal   Pelvic: External genitalia:  no lesions              Urethra:  normal appearing urethra with no masses, tenderness or lesions              Bartholin's and Skene's: normal                  Vagina: normal appearing vagina with normal color and discharge, no lesions              Cervix: anteverted              Pap taken: Yes.   Bimanual Exam:  Uterus:  normal size, contour, position, consistency, mobility, non-tender              Adnexa: no mass, fullness, tenderness               Rectovaginal: Confirms               Anus:  normal sphincter tone, no lesions  Chaperone present: Yes  A:  Well Woman with normal exam  Postmenopausal no HRT History of renal cyst - saw Dr. Jasmine December, last CT scan shows benign 4.2 X 4.8 X 5.5 cm left upper pole 2015 Not SA; atrophic vaginitis History of Vit d deficiency - on OTC Vit D now  Diverticulosis    P:   Reviewed health and wellness pertinent to exam  Pap smear as above  Mammogram is due 8/16  Counseled on breast self exam, mammography screening, adequate intake of calcium and vitamin D, diet and exercise, Kegel's exercises return annually or prn  An After Visit Summary was printed and given to the patient.

## 2015-01-05 LAB — WET PREP BY MOLECULAR PROBE
Candida species: NEGATIVE
Gardnerella vaginalis: NEGATIVE
Trichomonas vaginosis: NEGATIVE

## 2015-01-07 LAB — IPS PAP TEST WITH HPV

## 2015-01-12 ENCOUNTER — Ambulatory Visit: Payer: BC Managed Care – PPO

## 2015-02-07 ENCOUNTER — Ambulatory Visit (HOSPITAL_BASED_OUTPATIENT_CLINIC_OR_DEPARTMENT_OTHER)
Admission: RE | Admit: 2015-02-07 | Discharge: 2015-02-07 | Disposition: A | Discharge status: Home / Self Care | Payer: BC Managed Care – PPO | Source: Ambulatory Visit | Attending: Nurse Practitioner | Admitting: Nurse Practitioner

## 2015-02-07 DIAGNOSIS — Z1231 Encounter for screening mammogram for malignant neoplasm of breast: Secondary | ICD-10-CM | POA: Diagnosis present

## 2015-05-17 ENCOUNTER — Other Ambulatory Visit (INDEPENDENT_AMBULATORY_CARE_PROVIDER_SITE_OTHER): Payer: BC Managed Care – PPO

## 2015-05-17 DIAGNOSIS — K7200 Acute and subacute hepatic failure without coma: Secondary | ICD-10-CM | POA: Diagnosis not present

## 2015-05-17 LAB — HEPATIC FUNCTION PANEL
ALT: 16 U/L (ref 0–35)
AST: 20 U/L (ref 0–37)
Albumin: 4 g/dL (ref 3.5–5.2)
Alkaline Phosphatase: 117 U/L (ref 39–117)
Bilirubin, Direct: 0 mg/dL (ref 0.0–0.3)
Total Bilirubin: 0.3 mg/dL (ref 0.2–1.2)
Total Protein: 6.9 g/dL (ref 6.0–8.3)

## 2015-08-10 ENCOUNTER — Telehealth: Payer: Self-pay | Admitting: Internal Medicine

## 2015-08-10 NOTE — Telephone Encounter (Signed)
Yes but if have fever severe pain may need more emergent care  Have a clionical person call her in  The interim

## 2015-08-10 NOTE — Telephone Encounter (Signed)
Spoke to the pt.  She had a lot of pain last night.  Has pressure when she sits down.  Pain after she eats.  No diarrhea, nausea or vomiting.   Pain scale 5 that comes and goes.  Pt scheduled for 10:45 08/11/15.

## 2015-08-10 NOTE — Telephone Encounter (Signed)
Pt is having diverticulitis flare up and would like an appt tomorrow or Friday. Can I use sda slot?

## 2015-08-10 NOTE — Progress Notes (Signed)
Pre visit review using our clinic review tool, if applicable. No additional management support is needed unless otherwise documented below in the visit note.  Chief Complaint  Patient presents with  . Abdominal Pain    HPI: [name] [name] 60 y.o.  Patient [name] [name]  comes in today for SDA for  new problem evaluation. Onset 2 night ago and around perimeter abd area  And uncomfortable and continuing yesterday   And ate   lgiht soup  Better last night  Still sore     No fever    No fever.  Some cold feeling  .  Some better today but persistent  Constipation  A bit pre pain .  No diarrhea . No vomiting .  feles a bit like when had diverticulitis   Prefers  Med not with metallic taste ROS: See pertinent positives and negatives per HPI.  Past Medical History  Diagnosis Date  . Depression     stable on medication  . GERD (gastroesophageal reflux disease)   . PUD (peptic ulcer disease) 1980's  . Renal cyst     4.5-6 cm stable single  seen by uro in past  . Diverticulosis   . Hyperlipidemia     no meds needed  . Osteopenia   . Arthritis   . Gallstones   . IBS (irritable bowel syndrome)   . Diverticulitis 2015    Family History  Problem Relation Age of Onset  . Rheum arthritis Father   . Pneumonia Father     deceased  . Colon cancer Father 51  . Heart attack Father   . Breast cancer Sister 67  . Breast cancer Paternal Grandmother 66  . Osteoarthritis Mother   . COPD Mother   . Pneumonia Mother   . Osteoporosis Mother   . Cancer Maternal Grandmother     ? GI  . Heart failure Maternal Grandfather   . Heart failure Paternal Grandfather   . Esophageal cancer Neg Hx   . Rectal cancer Neg Hx   . Stomach cancer Neg Hx     Social History   Social History  . Marital Status: Divorced    Spouse Name: N/A  . Number of Children: 0  . Years of Education: N/A   Occupational History  . TEACHER    Social History Main Topics  . Smoking status: Never Smoker   .  Smokeless tobacco: Never Used  . Alcohol Use: Yes     Comment: occasionally  . Drug Use: No  . Sexual Activity: No   Other Topics Concern  . None   Social History Narrative   Copy.   Non smoker    Hh of 1 ... 2 cats    Single   G0P0   Mom is now in assisted living and she has moved into her old residence    Outpatient Prescriptions Prior to Visit  Medication Sig Dispense Refill  . Cholecalciferol (VITAMIN D PO) Take 2,000 mg by mouth every other day.     Marland Kitchen FLUoxetine (PROZAC) 40 MG capsule Take 40 mg by mouth daily.     . Multiple Vitamins-Minerals (CENTRUM ULTRA WOMENS PO) Take 1 tablet by mouth every other day.     Marland Kitchen omeprazole (PRILOSEC) 20 MG capsule TAKE ONE CAPSULE BY MOUTH TWICE DAILY  60 capsule 9  . Probiotic Product (PROBIOTIC PO) Take by mouth daily.    . propranolol (INDERAL) 10 MG tablet Take 10 mg  by mouth as needed.      No facility-administered medications prior to visit.     EXAM:  BP 136/72 mmHg  Temp(Src) 98.9 F (37.2 C) (Oral)  Wt 169 lb 3.2 oz (76.749 kg)  LMP 11/18/2004  Body mass index is 27.95 kg/(m^2).  GENERAL: vitals reviewed and listed above, alert, oriented, appears well hydrated and in no acute distress HEENT: atraumatic, conjunctiva  clear, no obvious abnormalities on inspection of external nose and ears NECK: no obvious masses on inspection palpation  LUNGS: clear to auscultation bilaterally, no wheezes, rales or rhonchi, good air movement CV: HRRR, no clubbing cyanosis or  peripheral edema nl cap refill  Abdomen:  Sof,t normal bowel sounds without hepatosplenomegaly, no guarding rebound  Tender llq and some rlq  or masses no CVA tenderness MS: moves all extremities without noticeable focal  abnormality PSYCH: pleasant and cooperative, no obvious depression or anxiety   ASSESSMENT AND PLAN:  Discussed the following assessment and plan:  Diverticulitis of colon without  hemorrhage - prob dx based on hx locateion context  get labs empriric rx ( pt request not one w metallic tasteo and fu last colon2014 5 y recall - Plan: CBC with Differential/Platelet, Basic metabolic panel, Hepatic function panel, Sedimentation rate, POCT urinalysis dipstick  Abdominal pain, unspecified abdominal location - Plan: CBC with Differential/Platelet, Basic metabolic panel, Hepatic function panel, Sedimentation rate, POCT urinalysis dipstick  -Patient advised to return or notify health care team  if symptoms worsen ,persist or new concerns arise.  Patient Instructions  This may be a mild case of diverticulitis  Take antibiotic  Will notify you  of labs when available.   ROV in 10 - 14 days or if not getting better or worse.  Diverticulitis Diverticulitis is inflammation or infection of small pouches in your colon that form when you have a condition called diverticulosis. The pouches in your colon are called diverticula. Your colon, or large intestine, is where water is absorbed and stool is formed. Complications of diverticulitis can include:  Bleeding.  Severe infection.  Severe pain.  Perforation of your colon.  Obstruction of your colon. CAUSES  Diverticulitis is caused by bacteria. Diverticulitis happens when stool becomes trapped in diverticula. This allows bacteria to grow in the diverticula, which can lead to inflammation and infection. RISK FACTORS People with diverticulosis are at risk for diverticulitis. Eating a diet that does not include enough fiber from fruits and vegetables may make diverticulitis more likely to develop. SYMPTOMS  Symptoms of diverticulitis may include:  Abdominal pain and tenderness. The pain is normally located on the left side of the abdomen, but may occur in other areas.  Fever and chills.  Bloating.  Cramping.  Nausea.  Vomiting.  Constipation.  Diarrhea.  Blood in your stool. DIAGNOSIS  Your health care provider will  ask you about your medical history and do a physical exam. You may need to have tests done because many medical conditions can cause the same symptoms as diverticulitis. Tests may include:  Blood tests.  Urine tests.  Imaging tests of the abdomen, including X-rays and CT scans. When your condition is under control, your health care provider may recommend that you have a colonoscopy. A colonoscopy can show how severe your diverticula are and whether something else is causing your symptoms. TREATMENT  Most cases of diverticulitis are mild and can be treated at home. Treatment may include:  Taking over-the-counter pain medicines.  Following a clear liquid diet.  Taking antibiotic medicines  by mouth for 7-10 days. More severe cases may be treated at a hospital. Treatment may include:  Not eating or drinking.  Taking prescription pain medicine.  Receiving antibiotic medicines through an IV tube.  Receiving fluids and nutrition through an IV tube.  Surgery. HOME CARE INSTRUCTIONS   Follow your health care provider's instructions carefully.  Follow a full liquid diet or other diet as directed by your health care provider. After your symptoms improve, your health care provider may tell you to change your diet. He or she may recommend you eat a high-fiber diet. Fruits and vegetables are good sources of fiber. Fiber makes it easier to pass stool.  Take fiber supplements or probiotics as directed by your health care provider.  Only take medicines as directed by your health care provider.  Keep all your follow-up appointments. SEEK MEDICAL CARE IF:   Your pain does not improve.  You have a hard time eating food.  Your bowel movements do not return to normal. SEEK IMMEDIATE MEDICAL CARE IF:   Your pain becomes worse.  Your symptoms do not get better.  Your symptoms suddenly get worse.  You have a fever.  You have repeated vomiting.  You have bloody or black, tarry  stools. MAKE SURE YOU:   Understand these instructions.  Will watch your condition.  Will get help right away if you are not doing well or get worse.   This information is not intended to replace advice given to you by your health care provider. Make sure you discuss any questions you have with your health care provider.   Document Released: 02/14/2005 Document Revised: 05/12/2013 Document Reviewed: 04/01/2013 Elsevier Interactive Patient Education 2016 [place] K. Panosh M.D.

## 2015-08-10 NOTE — Telephone Encounter (Signed)
Left a message for a return call.

## 2015-08-11 ENCOUNTER — Ambulatory Visit (INDEPENDENT_AMBULATORY_CARE_PROVIDER_SITE_OTHER): Payer: BC Managed Care – PPO | Admitting: Internal Medicine

## 2015-08-11 ENCOUNTER — Encounter: Payer: Self-pay | Admitting: Internal Medicine

## 2015-08-11 VITALS — BP 136/72 | Temp 98.9°F | Wt 169.2 lb

## 2015-08-11 DIAGNOSIS — K5732 Diverticulitis of large intestine without perforation or abscess without bleeding: Secondary | ICD-10-CM | POA: Diagnosis not present

## 2015-08-11 DIAGNOSIS — R109 Unspecified abdominal pain: Secondary | ICD-10-CM | POA: Diagnosis not present

## 2015-08-11 LAB — POCT URINALYSIS DIPSTICK
Bilirubin, UA: NEGATIVE
Blood, UA: NEGATIVE
Glucose, UA: NEGATIVE
Ketones, UA: NEGATIVE
Leukocytes, UA: NEGATIVE
Nitrite, UA: NEGATIVE
Protein, UA: NEGATIVE
Spec Grav, UA: 1.02
Urobilinogen, UA: 0.2
pH, UA: 7

## 2015-08-11 LAB — CBC WITH DIFFERENTIAL/PLATELET
Basophils Absolute: 0 10*3/uL (ref 0.0–0.1)
Basophils Relative: 0.7 % (ref 0.0–3.0)
Eosinophils Absolute: 0.1 10*3/uL (ref 0.0–0.7)
Eosinophils Relative: 2.7 % (ref 0.0–5.0)
HCT: 39.2 % (ref 36.0–46.0)
Hemoglobin: 13.1 g/dL (ref 12.0–15.0)
Lymphocytes Relative: 27.1 % (ref 12.0–46.0)
Lymphs Abs: 1.5 10*3/uL (ref 0.7–4.0)
MCHC: 33.4 g/dL (ref 30.0–36.0)
MCV: 88.1 fl (ref 78.0–100.0)
Monocytes Absolute: 0.6 10*3/uL (ref 0.1–1.0)
Monocytes Relative: 11.2 % (ref 3.0–12.0)
Neutro Abs: 3.1 10*3/uL (ref 1.4–7.7)
Neutrophils Relative %: 58.3 % (ref 43.0–77.0)
Platelets: 254 10*3/uL (ref 150.0–400.0)
RBC: 4.45 Mil/uL (ref 3.87–5.11)
RDW: 13.9 % (ref 11.5–15.5)
WBC: 5.4 10*3/uL (ref 4.0–10.5)

## 2015-08-11 LAB — BASIC METABOLIC PANEL
BUN: 15 mg/dL (ref 6–23)
CO2: 32 mEq/L (ref 19–32)
Calcium: 9.6 mg/dL (ref 8.4–10.5)
Chloride: 103 mEq/L (ref 96–112)
Creatinine, Ser: 0.75 mg/dL (ref 0.40–1.20)
GFR: 83.61 mL/min (ref >60.00)
Glucose, Bld: 79 mg/dL (ref 70–99)
Potassium: 4.7 mEq/L (ref 3.5–5.1)
Sodium: 140 mEq/L (ref 135–145)

## 2015-08-11 LAB — HEPATIC FUNCTION PANEL
ALT: 14 U/L (ref 0–35)
AST: 17 U/L (ref 0–37)
Albumin: 4.1 g/dL (ref 3.5–5.2)
Alkaline Phosphatase: 100 U/L (ref 39–117)
Bilirubin, Direct: 0.1 mg/dL (ref 0.0–0.3)
Total Bilirubin: 0.7 mg/dL (ref 0.2–1.2)
Total Protein: 7.7 g/dL (ref 6.0–8.3)

## 2015-08-11 LAB — SEDIMENTATION RATE: Sed Rate: 45 mm/hr — ABNORMAL HIGH (ref 0–22)

## 2015-08-11 MED ORDER — AMOXICILLIN-POT CLAVULANATE 875-125 MG PO TABS: 1.0000 | ORAL_TABLET | Freq: Two times a day (BID) | ORAL | Status: DC

## 2015-08-11 NOTE — Patient Instructions (Signed)
This may be a mild case of diverticulitis  Take antibiotic  Will notify you  of labs when available.   ROV in 10 - 14 days or if not getting better or worse.  Diverticulitis Diverticulitis is inflammation or infection of small pouches in your colon that form when you have a condition called diverticulosis. The pouches in your colon are called diverticula. Your colon, or large intestine, is where water is absorbed and stool is formed. Complications of diverticulitis can include:  Bleeding.  Severe infection.  Severe pain.  Perforation of your colon.  Obstruction of your colon. CAUSES  Diverticulitis is caused by bacteria. Diverticulitis happens when stool becomes trapped in diverticula. This allows bacteria to grow in the diverticula, which can lead to inflammation and infection. RISK FACTORS People with diverticulosis are at risk for diverticulitis. Eating a diet that does not include enough fiber from fruits and vegetables may make diverticulitis more likely to develop. SYMPTOMS  Symptoms of diverticulitis may include:  Abdominal pain and tenderness. The pain is normally located on the left side of the abdomen, but may occur in other areas.  Fever and chills.  Bloating.  Cramping.  Nausea.  Vomiting.  Constipation.  Diarrhea.  Blood in your stool. DIAGNOSIS  Your health care provider will ask you about your medical history and do a physical exam. You may need to have tests done because many medical conditions can cause the same symptoms as diverticulitis. Tests may include:  Blood tests.  Urine tests.  Imaging tests of the abdomen, including X-rays and CT scans. When your condition is under control, your health care provider may recommend that you have a colonoscopy. A colonoscopy can show how severe your diverticula are and whether something else is causing your symptoms. TREATMENT  Most cases of diverticulitis are mild and can be treated at home. Treatment may  include:  Taking over-the-counter pain medicines.  Following a clear liquid diet.  Taking antibiotic medicines by mouth for 7-10 days. More severe cases may be treated at a hospital. Treatment may include:  Not eating or drinking.  Taking prescription pain medicine.  Receiving antibiotic medicines through an IV tube.  Receiving fluids and nutrition through an IV tube.  Surgery. HOME CARE INSTRUCTIONS   Follow your health care provider's instructions carefully.  Follow a full liquid diet or other diet as directed by your health care provider. After your symptoms improve, your health care provider may tell you to change your diet. He or she may recommend you eat a high-fiber diet. Fruits and vegetables are good sources of fiber. Fiber makes it easier to pass stool.  Take fiber supplements or probiotics as directed by your health care provider.  Only take medicines as directed by your health care provider.  Keep all your follow-up appointments. SEEK MEDICAL CARE IF:   Your pain does not improve.  You have a hard time eating food.  Your bowel movements do not return to normal. SEEK IMMEDIATE MEDICAL CARE IF:   Your pain becomes worse.  Your symptoms do not get better.  Your symptoms suddenly get worse.  You have a fever.  You have repeated vomiting.  You have bloody or black, tarry stools. MAKE SURE YOU:   Understand these instructions.  Will watch your condition.  Will get help right away if you are not doing well or get worse.   This information is not intended to replace advice given to you by your health care provider. Make sure you discuss  any questions you have with your health care provider.   Document Released: 02/14/2005 Document Revised: 05/12/2013 Document Reviewed: 04/01/2013 Elsevier Interactive Patient Education [employer].

## 2015-08-29 ENCOUNTER — Ambulatory Visit: Payer: BC Managed Care – PPO | Admitting: Internal Medicine

## 2015-09-04 NOTE — Progress Notes (Signed)
Pre visit review using our clinic review tool, if applicable. No additional management support is needed unless otherwise documented below in the visit note. Chief Complaint  Patient presents with  . Follow-up    abd pain    HPI: [name] [name] 60 y.o.  comes in for follow-up after treatment for presumed diverticulitis. She states that she is back to baseline. She did take the Augmentin and her symptoms resolved in about 7-10 days. Her bowel patterns are back to normal. No new symptoms arise. ROS: See pertinent positives and negatives per HPI. Had a question about hepatitis C screening of note she has donated blood within the last 5 years. Has not received notification of any problem Past Medical History  Diagnosis Date  . Depression     stable on medication  . GERD (gastroesophageal reflux disease)   . PUD (peptic ulcer disease) 1980's  . Renal cyst     4.5-6 cm stable single  seen by uro in past  . Diverticulosis   . Hyperlipidemia     no meds needed  . Osteopenia   . Arthritis   . Gallstones   . IBS (irritable bowel syndrome)   . Diverticulitis 2015    Family History  Problem Relation Age of Onset  . Rheum arthritis Father   . Pneumonia Father     deceased  . Colon cancer Father 45  . Heart attack Father   . Breast cancer Sister 66  . Breast cancer Paternal Grandmother 54  . Osteoarthritis Mother   . COPD Mother   . Pneumonia Mother   . Osteoporosis Mother   . Cancer Maternal Grandmother     ? GI  . Heart failure Maternal Grandfather   . Heart failure Paternal Grandfather   . Esophageal cancer Neg Hx   . Rectal cancer Neg Hx   . Stomach cancer Neg Hx     Social History   Social History  . Marital Status: Divorced    Spouse Name: N/A  . Number of Children: 0  . Years of Education: N/A   Occupational History  . TEACHER    Social History Main Topics  . Smoking status: Never Smoker   . Smokeless tobacco: Never Used  . Alcohol Use: Yes     Comment:  occasionally  . Drug Use: No  . Sexual Activity: No   Other Topics Concern  . None   Social History Narrative   Copy.   Non smoker    Hh of 1 ... 2 cats    Single   G0P0   Mom is now in assisted living and she has moved into her old residence    Outpatient Prescriptions Prior to Visit  Medication Sig Dispense Refill  . Cholecalciferol (VITAMIN D PO) Take 2,000 mg by mouth every other day.     Marland Kitchen FLUoxetine (PROZAC) 40 MG capsule Take 40 mg by mouth daily.     . Multiple Vitamins-Minerals (CENTRUM ULTRA WOMENS PO) Take 1 tablet by mouth every other day.     Marland Kitchen omeprazole (PRILOSEC) 20 MG capsule TAKE ONE CAPSULE BY MOUTH TWICE DAILY  60 capsule 9  . Probiotic Product (PROBIOTIC PO) Take by mouth daily.    . propranolol (INDERAL) 10 MG tablet Take 10 mg by mouth as needed.     Marland Kitchen amoxicillin-clavulanate (AUGMENTIN) 875-125 MG tablet Take 1 tablet by mouth every 12 (twelve) hours. 20 tablet 0   No facility-administered medications prior  to visit.     EXAM:  BP 124/66 mmHg  Temp(Src) 98.8 F (37.1 C) (Oral)  Wt 169 lb 11.2 oz (76.975 kg)  LMP 11/18/2004  Body mass index is 28.04 kg/(m^2).  GENERAL: vitals reviewed and listed above, alert, oriented, appears well hydrated and in no acute distress HEENT: atraumatic, conjunctiva  clear, no obvious abnormalities on inspection of external nose and ears NECK: no obvious masses on inspection palpation  LUNGS: clear to auscultation bilaterally, no wheezes, rales or rhonchi, good air movement CV: HRRR, no clubbing cyanosis or  peripheral edema nl cap refill  Abdomen soft without organomegaly guarding or rebound.  PSYCH: pleasant and cooperative, no obvious depression or anxiety Lab Results  Component Value Date   WBC 5.4 08/11/2015   HGB 13.1 08/11/2015   HCT 39.2 08/11/2015   PLT 254.0 08/11/2015   GLUCOSE 79 08/11/2015   CHOL 210* 08/11/2014   TRIG 56.0 08/11/2014   HDL  74.00 08/11/2014   LDLDIRECT 132.7 03/24/2013   LDLCALC 125* 08/11/2014   ALT 14 08/11/2015   AST 17 08/11/2015   NA 140 08/11/2015   K 4.7 08/11/2015   CL 103 08/11/2015   CREATININE 0.75 08/11/2015   BUN 15 08/11/2015   CO2 32 08/11/2015   TSH 1.38 08/11/2014   Lab Results  Component Value Date   ALT 14 08/11/2015   AST 17 08/11/2015   ALKPHOS 100 08/11/2015   BILITOT 0.7 08/11/2015    ASSESSMENT AND PLAN:  Discussed the following assessment and plan:  Abdominal pain, unspecified abdominal location  Diverticulitis of colon without hemorrhage Family history of colon cancer due for repeat colonoscopy 2019. If any recurrence of her problem will get GI and put. Otherwise we'll follow .  Asian agrees with plan No real need for hepatitis C screening as she has been donating blood in the past 5 years.  -Patient advised to return or notify health care team  if symptoms worsen ,persist or new concerns arise.  Patient Instructions   Since you are so much better will recheck as needed. Or if recurs  We may get you back to Gi earlier  I think your colon  Repeat is due 2019    Lab Results  Component Value Date   WBC 5.4 08/11/2015   HGB 13.1 08/11/2015   HCT 39.2 08/11/2015   PLT 254.0 08/11/2015   GLUCOSE 79 08/11/2015   CHOL 210* 08/11/2014   TRIG 56.0 08/11/2014   HDL 74.00 08/11/2014   LDLDIRECT 132.7 03/24/2013   LDLCALC 125* 08/11/2014   ALT 14 08/11/2015   AST 17 08/11/2015   NA 140 08/11/2015   K 4.7 08/11/2015   CL 103 08/11/2015   CREATININE 0.75 08/11/2015   BUN 15 08/11/2015   CO2 32 08/11/2015   TSH 1.38 08/11/2014        Mariann Laster K. Jmichael Gille M.D.

## 2015-09-05 ENCOUNTER — Encounter: Payer: Self-pay | Admitting: Internal Medicine

## 2015-09-05 ENCOUNTER — Ambulatory Visit (INDEPENDENT_AMBULATORY_CARE_PROVIDER_SITE_OTHER): Payer: BC Managed Care – PPO | Admitting: Internal Medicine

## 2015-09-05 VITALS — BP 124/66 | Temp 98.8°F | Wt 169.7 lb

## 2015-09-05 DIAGNOSIS — K5732 Diverticulitis of large intestine without perforation or abscess without bleeding: Secondary | ICD-10-CM

## 2015-09-05 DIAGNOSIS — R109 Unspecified abdominal pain: Secondary | ICD-10-CM | POA: Diagnosis not present

## 2015-09-05 NOTE — Patient Instructions (Signed)
Since you are so much better will recheck as needed. Or if recurs  We may get you back to Gi earlier  I think your colon  Repeat is due 2019    Lab Results  Component Value Date   WBC 5.4 08/11/2015   HGB 13.1 08/11/2015   HCT 39.2 08/11/2015   PLT 254.0 08/11/2015   GLUCOSE 79 08/11/2015   CHOL 210* 08/11/2014   TRIG 56.0 08/11/2014   HDL 74.00 08/11/2014   LDLDIRECT 132.7 03/24/2013   LDLCALC 125* 08/11/2014   ALT 14 08/11/2015   AST 17 08/11/2015   NA 140 08/11/2015   K 4.7 08/11/2015   CL 103 08/11/2015   CREATININE 0.75 08/11/2015   BUN 15 08/11/2015   CO2 32 08/11/2015   TSH 1.38 08/11/2014

## 2015-10-06 ENCOUNTER — Ambulatory Visit: Payer: BC Managed Care – PPO | Admitting: Gastroenterology

## 2015-11-04 ENCOUNTER — Encounter: Payer: Self-pay | Admitting: Gastroenterology

## 2015-11-04 ENCOUNTER — Ambulatory Visit (INDEPENDENT_AMBULATORY_CARE_PROVIDER_SITE_OTHER): Payer: BC Managed Care – PPO | Admitting: Gastroenterology

## 2015-11-04 VITALS — BP 128/68 | HR 74 | Ht 66.5 in | Wt 172.0 lb

## 2015-11-04 DIAGNOSIS — K219 Gastro-esophageal reflux disease without esophagitis: Secondary | ICD-10-CM

## 2015-11-04 DIAGNOSIS — Z800 Family history of malignant neoplasm of digestive organs: Secondary | ICD-10-CM | POA: Diagnosis not present

## 2015-11-04 MED ORDER — OMEPRAZOLE 20 MG PO CPDR: 20.0000 mg | DELAYED_RELEASE_CAPSULE | Freq: Every day | ORAL | Status: DC

## 2015-11-04 NOTE — Patient Instructions (Addendum)
We have sent the following medications to your pharmacy for you to pick up at your convenience: Prilosec Thank you for choosing [facility]  Dr Wilfrid Lund III

## 2015-11-04 NOTE — Progress Notes (Signed)
[facility] Progress Note  Chief Complaint: GERD and dyspepsia  Subjective History:  [name] [name] sees me for the first time, having last seen Dr. Olevia Perches in October 2015 after an episode of diverticulitis. She was also seen previously for GERD and what sounds like nonulcer dyspepsia. Last colonoscopy was in 2014. Curiously, one of Dr. Nichola Sizer office notes indicates the need for a 5 year follow-up due to family history of colon cancer in the patient's father, but her most recent note says a 10 year interval. I have confirmed with Razia that her father developed colorectal cancer in his late 51s, thus we will bring her back in 2019. She is been doing well with intermittent reflux symptoms that are well controlled on Prilosec 20 mg once a day, which she takes at bedtime. She denies change in bowel habits or rectal bleeding. ROS: Cardiovascular:  no chest pain Respiratory: no dyspnea  The patient's Past Medical, Family and Social History were reviewed and are on file in the EMR.  Objective:  Med list reviewed  Vital signs in last 24 hrs: Filed Vitals:   11/04/15 1324  BP: 128/68  Pulse: 74    Physical Exam   HEENT: sclera anicteric, oral mucosa moist without lesions  Neck: supple, no thyromegaly, JVD or lymphadenopathy  Cardiac: RRR without murmurs, S1S2 heard, no peripheral edema  Pulm: clear to auscultation bilaterally, normal RR and effort noted  Abdomen: soft, No tenderness, with active bowel sounds. No guarding or palpable hepatosplenomegaly.  Skin; warm and dry, no jaundice or rash    @ASSESSMENTPLANBEGIN @ Assessment: Encounter Diagnoses  Name Primary?  . Gastroesophageal reflux disease without esophagitis Yes  . Family history of colon cancer      Plan: Omeprazole 20 mg a day, re-dose it to before supper. Nonpharmacologic antireflux measures were reviewed.  Next colonoscopy in 2019  See me in a year or sooner as needed  Nelida Meuse III

## 2015-12-05 ENCOUNTER — Telehealth: Payer: Self-pay | Admitting: Internal Medicine

## 2015-12-05 DIAGNOSIS — Z0184 Encounter for antibody response examination: Secondary | ICD-10-CM

## 2015-12-05 NOTE — Telephone Encounter (Signed)
Pt is sch for cpx labs on 02-08-16. Pt would like to have hep c screening. Can I add?

## 2015-12-06 ENCOUNTER — Other Ambulatory Visit: Payer: Self-pay | Admitting: Family Medicine

## 2015-12-06 DIAGNOSIS — Z0000 Encounter for general adult medical examination without abnormal findings: Secondary | ICD-10-CM

## 2015-12-06 DIAGNOSIS — E559 Vitamin D deficiency, unspecified: Secondary | ICD-10-CM

## 2015-12-06 NOTE — Telephone Encounter (Signed)
Order placed

## 2016-01-25 ENCOUNTER — Encounter: Payer: Self-pay | Admitting: Adult Health

## 2016-01-25 ENCOUNTER — Ambulatory Visit (INDEPENDENT_AMBULATORY_CARE_PROVIDER_SITE_OTHER): Payer: BC Managed Care – PPO | Admitting: Adult Health

## 2016-01-25 VITALS — BP 130/74 | Temp 98.4°F | Ht 66.5 in | Wt 174.3 lb

## 2016-01-25 DIAGNOSIS — H8113 Benign paroxysmal vertigo, bilateral: Secondary | ICD-10-CM | POA: Diagnosis not present

## 2016-01-25 MED ORDER — MECLIZINE HCL 12.5 MG PO TABS: 12.5000 mg | ORAL_TABLET | Freq: Two times a day (BID) | ORAL | 1 refills | Status: DC | PRN

## 2016-01-25 NOTE — Progress Notes (Signed)
Subjective:    Patient ID: [name] [name], female    DOB: March 01, 1956, 60 y.o.   MRN: [id number]  HPI  60 year old female who  has a past medical history of Arthritis; Depression; Diverticulitis (2015); Diverticulosis; Gallstones; GERD (gastroesophageal reflux disease); Hyperlipidemia; IBS (irritable bowel syndrome); Osteopenia; PUD (peptic ulcer disease) (1980's); and Renal cyst. She presents today with the acute complaint of 24 hours of positional dizziness and sinus pressure. Per patient yesterday when she awoke in the morning she became dizzy when she got out of bed. The dizziness seemed to only happen when she moved her head. She felt as though the room was spinning. As the day went on the dizziness became better. When she woke up this morning she continued to feel dizzy with certain head positions but she also noticed that she felt " stuffy and had pain in her sinus cavities. The dizziness today is not as bad as yesterday   She denies fevers, nausea, vomiting, diarrhea.    Review of Systems  Constitutional: Negative.   HENT: Positive for sinus pressure. Negative for ear discharge, ear pain, postnasal drip, rhinorrhea and tinnitus.   Respiratory: Negative.   Cardiovascular: Negative.   Genitourinary: Negative.   Musculoskeletal: Negative.   Neurological: Positive for dizziness. Negative for facial asymmetry, speech difficulty, weakness and light-headedness.  Hematological: Negative.   All other systems reviewed and are negative.  Past Medical History:  Diagnosis Date  . Arthritis   . Depression    stable on medication  . Diverticulitis 2015  . Diverticulosis   . Gallstones   . GERD (gastroesophageal reflux disease)   . Hyperlipidemia    no meds needed  . IBS (irritable bowel syndrome)   . Osteopenia   . PUD (peptic ulcer disease) 1980's  . Renal cyst    4.5-6 cm stable single  seen by uro in past    Social History   Social History  . Marital status: Divorced   Spouse name: N/A  . Number of children: 0  . Years of education: N/A   Occupational History  . [employer]   Social History Main Topics  . Smoking status: Never Smoker  . Smokeless tobacco: Never Used  . Alcohol use Yes     Comment: occasionally  . Drug use: No  . Sexual activity: No   Other Topics Concern  . Not on file   Social History Narrative   Teacher media specialist graduate degree Oncologist.   Non smoker    Hh of 1 ... 2 cats    Single   G0P0   Mom is now in assisted living and she has moved into her old residence    Past Surgical History:  Procedure Laterality Date  . CHOLECYSTECTOMY  1987  . COLONOSCOPY    . IRRIGATION AND DEBRIDEMENT SEBACEOUS CYST Right 3/ 2014   chest wall  . UPPER GASTROINTESTINAL ENDOSCOPY    . WISDOM TOOTH EXTRACTION  age 75 & 75   lower teeth done first and upper wisdom teeth last    Family History  Problem Relation Age of Onset  . Rheum arthritis Father   . Pneumonia Father     deceased  . Colon cancer Father 4  . Heart attack Father   . Breast cancer Sister 28  . Breast cancer Paternal Grandmother 75  . Osteoarthritis Mother   . COPD Mother   . Pneumonia Mother   . Osteoporosis Mother   . Cancer  Maternal Grandmother     ? GI  . Heart failure Maternal Grandfather   . Heart failure Paternal Grandfather   . Esophageal cancer Neg Hx   . Rectal cancer Neg Hx   . Stomach cancer Neg Hx     No Known Allergies  Current Outpatient Prescriptions on File Prior to Visit  Medication Sig Dispense Refill  . Cholecalciferol (VITAMIN D PO) Take 2,000 mg by mouth every other day.     Marland Kitchen FLUoxetine (PROZAC) 40 MG capsule Take 40 mg by mouth daily.     . Multiple Vitamins-Minerals (CENTRUM ULTRA WOMENS PO) Take 1 tablet by mouth every other day.     Marland Kitchen omeprazole (PRILOSEC) 20 MG capsule Take 1 capsule (20 mg total) by mouth daily. 30 capsule 11  . Probiotic Product (PROBIOTIC PO) Take by mouth daily.    .  propranolol (INDERAL) 10 MG tablet Take 10 mg by mouth as needed.      No current facility-administered medications on file prior to visit.     BP 130/74   Temp 98.4 F (36.9 C) (Oral)   Ht 5' 6.5" (1.689 m)   Wt 174 lb 4.8 oz (79.1 kg)   LMP 11/18/2004   BMI 27.71 kg/m         Objective:   Physical Exam  Constitutional: She is oriented to person, place, and time. She appears well-developed and well-nourished. No distress.  HENT:  Head: Normocephalic and atraumatic.  Right Ear: Hearing, external ear and ear canal normal. Tympanic membrane is bulging. Tympanic membrane is not erythematous.  Left Ear: Hearing, external ear and ear canal normal. Tympanic membrane is bulging. Tympanic membrane is not erythematous.  Nose: Nose normal.  Mouth/Throat: Oropharynx is clear and moist.  Eyes: Conjunctivae and EOM are normal. Pupils are equal, round, and reactive to light. Right eye exhibits no discharge. Left eye exhibits no discharge.  Neck: Normal range of motion. No thyromegaly present.  Cardiovascular: Normal rate, regular rhythm, normal heart sounds and intact distal pulses.  Exam reveals no gallop.   No murmur heard. Pulmonary/Chest: Effort normal and breath sounds normal. No respiratory distress. She has no wheezes. She has no rales. She exhibits no tenderness.  Lymphadenopathy:    She has no cervical adenopathy.  Neurological: She is alert and oriented to person, place, and time.  Dizziness when going from sitting to supine position. Dizziness when looking left to right. No increased dizziness on either side.  No nystagmus   Skin: Skin is warm and dry. No rash noted. She is not diaphoretic. No erythema. No pallor.  Psychiatric: She has a normal mood and affect. Her behavior is normal. Judgment and thought content normal.  Nursing note and vitals reviewed.     Assessment & Plan:  1. Benign paroxysmal positional vertigo, bilateral - meclizine (ANTIVERT) 12.5 MG tablet; Take 1  tablet (12.5 mg total) by mouth 2 (two) times daily as needed for dizziness.  Dispense: 30 tablet; Refill: 1 - Information given on home Eply maneuver - Advised Flonase and Zyrtec D for congestion behind ears. No signs of infection  - Consider vestibular rehab if no improvement   Dorothyann Peng, NP

## 2016-01-25 NOTE — Patient Instructions (Addendum)
It was great seeing you today!  Your exam is consistent with Vertigo. I have sent in a prescription for Meclzine to help with your symptoms.   I have also attached some home exercises to help with the dizziness  Use Flonase and Zyrtec D, Claritin D or Allegra D to help get the fluid out from behind your ear  Follow up if no improvement   Benign Positional Vertigo Vertigo is the feeling that you or your surroundings are moving when they are not. Benign positional vertigo is the most common form of vertigo. The cause of this condition is not serious (is benign). This condition is triggered by certain movements and positions (is positional). This condition can be dangerous if it occurs while you are doing something that could endanger you or others, such as driving.  CAUSES In many cases, the cause of this condition is not known. It may be caused by a disturbance in an area of the inner ear that helps your brain to sense movement and balance. This disturbance can be caused by a viral infection (labyrinthitis), head injury, or repetitive motion. RISK FACTORS This condition is more likely to develop in:  Women.  People who are 27 years of age or older. SYMPTOMS Symptoms of this condition usually happen when you move your head or your eyes in different directions. Symptoms may start suddenly, and they usually last for less than a minute. Symptoms may include:  Loss of balance and falling.  Feeling like you are spinning or moving.  Feeling like your surroundings are spinning or moving.  Nausea and vomiting.  Blurred vision.  Dizziness.  Involuntary eye movement (nystagmus). Symptoms can be mild and cause only slight annoyance, or they can be severe and interfere with daily life. Episodes of benign positional vertigo may return (recur) over time, and they may be triggered by certain movements. Symptoms may improve over time. DIAGNOSIS This condition is usually diagnosed by medical  history and a physical exam of the head, neck, and ears. You may be referred to a health care provider who specializes in ear, nose, and throat (ENT) problems (otolaryngologist) or a provider who specializes in disorders of the nervous system (neurologist). You may have additional testing, including:  MRI.  A CT scan.  Eye movement tests. Your health care provider may ask you to change positions quickly while he or she watches you for symptoms of benign positional vertigo, such as nystagmus. Eye movement may be tested with an electronystagmogram (ENG), caloric stimulation, the Dix-Hallpike test, or the roll test.  An electroencephalogram (EEG). This records electrical activity in your brain.  Hearing tests. TREATMENT Usually, your health care provider will treat this by moving your head in specific positions to adjust your inner ear back to normal. Surgery may be needed in severe cases, but this is rare. In some cases, benign positional vertigo may resolve on its own in 2-4 weeks. HOME CARE INSTRUCTIONS Safety  Move slowly.Avoid sudden body or head movements.  Avoid driving.  Avoid operating heavy machinery.  Avoid doing any tasks that would be dangerous to you or others if a vertigo episode would occur.  If you have trouble walking or keeping your balance, try using a cane for stability. If you feel dizzy or unstable, sit down right away.  Return to your normal activities as told by your health care provider. Ask your health care provider what activities are safe for you. General Instructions  Take over-the-counter and prescription medicines only as told  by your health care provider.  Avoid certain positions or movements as told by your health care provider.  Drink enough fluid to keep your urine clear or pale yellow.  Keep all follow-up visits as told by your health care provider. This is important. SEEK MEDICAL CARE IF:  You have a fever.  Your condition gets worse or you  develop new symptoms.  Your family or friends notice any behavioral changes.  Your nausea or vomiting gets worse.  You have numbness or a "pins and needles" sensation. SEEK IMMEDIATE MEDICAL CARE IF:  You have difficulty speaking or moving.  You are always dizzy.  You faint.  You develop severe headaches.  You have weakness in your legs or arms.  You have changes in your hearing or vision.  You develop a stiff neck.  You develop sensitivity to light.   This information is not intended to replace advice given to you by your health care provider. Make sure you discuss any questions you have with your health care provider.   Document Released: 02/12/2006 Document Revised: 01/26/2015 Document Reviewed: 08/30/2014 Elsevier Interactive Patient Education [employer].

## 2016-02-08 ENCOUNTER — Other Ambulatory Visit: Payer: BC Managed Care – PPO

## 2016-02-13 ENCOUNTER — Other Ambulatory Visit: Payer: Self-pay | Admitting: Nurse Practitioner

## 2016-02-13 DIAGNOSIS — Z1231 Encounter for screening mammogram for malignant neoplasm of breast: Secondary | ICD-10-CM

## 2016-02-15 ENCOUNTER — Encounter: Payer: BC Managed Care – PPO | Admitting: Internal Medicine

## 2016-03-01 ENCOUNTER — Ambulatory Visit (HOSPITAL_BASED_OUTPATIENT_CLINIC_OR_DEPARTMENT_OTHER)
Admission: RE | Admit: 2016-03-01 | Discharge: 2016-03-01 | Disposition: A | Discharge status: Home / Self Care | Payer: BC Managed Care – PPO | Source: Ambulatory Visit | Attending: Nurse Practitioner | Admitting: Nurse Practitioner

## 2016-03-01 DIAGNOSIS — Z1231 Encounter for screening mammogram for malignant neoplasm of breast: Secondary | ICD-10-CM | POA: Diagnosis present

## 2016-03-12 ENCOUNTER — Ambulatory Visit: Payer: BC Managed Care – PPO | Admitting: Nurse Practitioner

## 2016-03-29 ENCOUNTER — Other Ambulatory Visit (INDEPENDENT_AMBULATORY_CARE_PROVIDER_SITE_OTHER): Payer: BC Managed Care – PPO

## 2016-03-29 DIAGNOSIS — E559 Vitamin D deficiency, unspecified: Secondary | ICD-10-CM

## 2016-03-29 DIAGNOSIS — Z0000 Encounter for general adult medical examination without abnormal findings: Secondary | ICD-10-CM | POA: Diagnosis not present

## 2016-03-29 LAB — HEPATIC FUNCTION PANEL
ALT: 16 U/L (ref 0–35)
AST: 19 U/L (ref 0–37)
Albumin: 4.2 g/dL (ref 3.5–5.2)
Alkaline Phosphatase: 124 U/L — ABNORMAL HIGH (ref 39–117)
Bilirubin, Direct: 0.1 mg/dL (ref 0.0–0.3)
Total Bilirubin: 0.5 mg/dL (ref 0.2–1.2)
Total Protein: 7 g/dL (ref 6.0–8.3)

## 2016-03-29 LAB — CBC WITH DIFFERENTIAL/PLATELET
Basophils Absolute: 0.1 10*3/uL (ref 0.0–0.1)
Basophils Relative: 1.4 % (ref 0.0–3.0)
Eosinophils Absolute: 0.1 10*3/uL (ref 0.0–0.7)
Eosinophils Relative: 3 % (ref 0.0–5.0)
HCT: 39.9 % (ref 36.0–46.0)
Hemoglobin: 13.4 g/dL (ref 12.0–15.0)
Lymphocytes Relative: 28.9 % (ref 12.0–46.0)
Lymphs Abs: 1.4 10*3/uL (ref 0.7–4.0)
MCHC: 33.7 g/dL (ref 30.0–36.0)
MCV: 88 fl (ref 78.0–100.0)
Monocytes Absolute: 0.5 10*3/uL (ref 0.1–1.0)
Monocytes Relative: 10 % (ref 3.0–12.0)
Neutro Abs: 2.8 10*3/uL (ref 1.4–7.7)
Neutrophils Relative %: 56.7 % (ref 43.0–77.0)
Platelets: 256 10*3/uL (ref 150.0–400.0)
RBC: 4.53 Mil/uL (ref 3.87–5.11)
RDW: 13.5 % (ref 11.5–15.5)
WBC: 5 10*3/uL (ref 4.0–10.5)

## 2016-03-29 LAB — BASIC METABOLIC PANEL
BUN: 16 mg/dL (ref 6–23)
CO2: 32 mEq/L (ref 19–32)
Calcium: 9.4 mg/dL (ref 8.4–10.5)
Chloride: 102 mEq/L (ref 96–112)
Creatinine, Ser: 0.75 mg/dL (ref 0.40–1.20)
GFR: 83.43 mL/min (ref >60.00)
Glucose, Bld: 98 mg/dL (ref 70–99)
Potassium: 4.3 mEq/L (ref 3.5–5.1)
Sodium: 141 mEq/L (ref 135–145)

## 2016-03-29 LAB — LIPID PANEL
Cholesterol: 222 mg/dL — ABNORMAL HIGH (ref 0–200)
HDL: 79.4 mg/dL (ref >39.00)
LDL Cholesterol: 132 mg/dL — ABNORMAL HIGH (ref 0–99)
NonHDL: 142.3
Total CHOL/HDL Ratio: 3
Triglycerides: 52 mg/dL (ref 0.0–149.0)
VLDL: 10.4 mg/dL (ref 0.0–40.0)

## 2016-03-29 LAB — TSH: TSH: 1.29 u[IU]/mL (ref 0.35–4.50)

## 2016-03-29 LAB — VITAMIN D 25 HYDROXY (VIT D DEFICIENCY, FRACTURES): VITD: 51.31 ng/mL (ref 30.00–100.00)

## 2016-04-04 ENCOUNTER — Encounter: Payer: Self-pay | Admitting: Internal Medicine

## 2016-04-04 ENCOUNTER — Ambulatory Visit (INDEPENDENT_AMBULATORY_CARE_PROVIDER_SITE_OTHER): Payer: BC Managed Care – PPO | Admitting: Internal Medicine

## 2016-04-04 VITALS — BP 128/70 | Temp 98.5°F | Ht 65.0 in | Wt 169.2 lb

## 2016-04-04 DIAGNOSIS — Z2821 Immunization not carried out because of patient refusal: Secondary | ICD-10-CM

## 2016-04-04 DIAGNOSIS — R748 Abnormal levels of other serum enzymes: Secondary | ICD-10-CM

## 2016-04-04 DIAGNOSIS — Z0000 Encounter for general adult medical examination without abnormal findings: Secondary | ICD-10-CM

## 2016-04-04 NOTE — Progress Notes (Signed)
Chief Complaint  Patient presents with  . Annual Exam    HPI: Patient  [name] [name]  60 y.o. comes in today for Preventive Health Care visit  See uro renal cyst  Gyne  Dr Caprice Beaver for prozac  All stable  Gi  Daily ppi gets hb is not taking  hasnt ttired weaning  Vertigo is better   Health Maintenance  Topic Date Due  . INFLUENZA VACCINE  10/18/2016 (Originally 12/20/2015)  . ZOSTAVAX  04/03/2017 (Originally 01/06/2015)  . Hepatitis C Screening  04/03/2017 (Originally [birth year])  . HIV Screening  04/03/2017 (Originally 01/05/1971)  . PAP SMEAR  01/03/2018  . COLONOSCOPY  02/27/2018  . MAMMOGRAM  03/01/2018  . TETANUS/TDAP  05/14/2023   Health Maintenance Review LIFESTYLE:  Exercise:   Walking  ... Tobacco/ETS: no Alcohol:  no Sugar beverages: diet soda  Sleep:  About 6 hours  7  Drug use: no HH of   1... 2 cats   Geriatric .  Work:   8 per day x 5 days .     ROS:   V v  Itches at tims   Left leg cramp at night  At times.  GEN/ HEENT: No fever, significant weight changes sweats headaches vision problems hearing changes, CV/ PULM; No chest pain shortness of breath cough, syncope,edema  change in exercise tolerance. GI /GU: No adominal pain, vomiting, change in bowel habits. No blood in the stool. No significant GU symptoms. SKIN/HEME: ,no acute skin rashes suspicious lesions or bleeding. No lymphadenopathy, nodules, masses.  NEURO/ PSYCH:  No neurologic signs such as weakness numbness. No depression anxiety. IMM/ Allergy: No unusual infections.  Allergy .   REST of 12 system review negative except as per HPI   Past Medical History:  Diagnosis Date  . Arthritis   . Depression    stable on medication  . Diverticulitis 2015  . Diverticulosis   . Gallstones   . GERD (gastroesophageal reflux disease)   . Hyperlipidemia    no meds needed  . IBS (irritable bowel syndrome)   . Osteopenia   . PUD (peptic ulcer disease) 1980's  . Renal cyst    4.5-6 cm stable  single  seen by uro in past    Past Surgical History:  Procedure Laterality Date  . CHOLECYSTECTOMY  1987  . COLONOSCOPY    . IRRIGATION AND DEBRIDEMENT SEBACEOUS CYST Right 3/ 2014   chest wall  . UPPER GASTROINTESTINAL ENDOSCOPY    . WISDOM TOOTH EXTRACTION  age 51 & 57   lower teeth done first and upper wisdom teeth last    Family History  Problem Relation Age of Onset  . Rheum arthritis Father   . Pneumonia Father     deceased  . Colon cancer Father 61  . Heart attack Father   . Breast cancer Sister 50  . Breast cancer Paternal Grandmother 33  . Osteoarthritis Mother   . COPD Mother   . Pneumonia Mother   . Osteoporosis Mother   . Cancer Maternal Grandmother     ? GI  . Heart failure Maternal Grandfather   . Heart failure Paternal Grandfather   . Esophageal cancer Neg Hx   . Rectal cancer Neg Hx   . Stomach cancer Neg Hx     Social History   Social History  . Marital status: Divorced    Spouse name: N/A  . Number of children: 0  . Years of education: N/A   Occupational History  .  [employer]   Social History Main Topics  . Smoking status: Never Smoker  . Smokeless tobacco: Never Used  . Alcohol use Yes     Comment: occasionally  . Drug use: No  . Sexual activity: No   Other Topics Concern  . None   Social History Narrative   Copy.   Non smoker    Hh of 1 ... 2 cats    Single   [id number]   Mom is now in assisted living and she has moved into her old residence    Outpatient Medications Prior to Visit  Medication Sig Dispense Refill  . Cholecalciferol (VITAMIN D PO) Take 2,000 mg by mouth every other day.     Marland Kitchen FLUoxetine (PROZAC) 40 MG capsule Take 40 mg by mouth daily.     . Multiple Vitamins-Minerals (CENTRUM ULTRA WOMENS PO) Take 1 tablet by mouth every other day.     Marland Kitchen omeprazole (PRILOSEC) 20 MG capsule Take 1 capsule (20 mg total) by mouth daily. 30 capsule 11  . Probiotic  Product (PROBIOTIC PO) Take by mouth daily.    . propranolol (INDERAL) 10 MG tablet Take 10 mg by mouth as needed.     . meclizine (ANTIVERT) 12.5 MG tablet Take 1 tablet (12.5 mg total) by mouth 2 (two) times daily as needed for dizziness. (Patient not taking: Reported on 04/04/2016) 30 tablet 1   No facility-administered medications prior to visit.      EXAM:  BP 128/70 (BP Location: Right Arm, Patient Position: Sitting, Cuff Size: Normal)   Temp 98.5 F (36.9 C) (Oral)   Ht '5\' 5"'$  (1.651 m)   Wt 169 lb 3.2 oz (76.7 kg)   LMP 11/18/2004   BMI 28.16 kg/m   Body mass index is 28.16 kg/m.  Physical Exam: Vital signs reviewed SWN:IOEV is a well-developed well-nourished alert cooperative    who appearsr stated age in no acute distress.  HEENT: normocephalic atraumatic , Eyes: PERRL EOM's full, conjunctiva clear, Nares: paten,t no deformity discharge or tenderness., Ears: no deformity EAC's clear TMs with normal landmarks. Mouth: clear OP, no lesions, edema.  Moist mucous membranes. Dentition in adequate repair. NECK: supple without masses, thyromegaly or bruits. CHEST/PULM:  Clear to auscultation and percussion breath sounds equal no wheeze , rales or rhonchi. No chest wall deformities or tenderness.Breast: normal by inspection . No dimpling, discharge, masses, tenderness or discharge . CV: PMI is nondisplaced, S1 S2 no gallops, murmurs, rubs. Peripheral pulses are full without delay.No JVD .  ABDOMEN: Bowel sounds normal nontender  No guard or rebound, no hepato splenomegal no CVA tenderness.  No hernia. Extremtities:  No clubbing cyanosis or edema, no acute joint swelling or redness no focal atrophy  VV noted no acute findings  NEURO:  Oriented x3, cranial nerves 3-12 appear to be intact, no obvious focal weakness,gait within normal limits no abnormal reflexes or asymmetrical SKIN: No acute rashes normal turgor, color, no bruising or petechiae. PSYCH: Oriented, good eye contact, no  obvious depression anxiety, cognition and judgment appear normal. LN: no cervical axillary inguinal adenopathy  Lab Results  Component Value Date   WBC 5.0 03/29/2016   HGB 13.4 03/29/2016   HCT 39.9 03/29/2016   PLT 256.0 03/29/2016   GLUCOSE 98 03/29/2016   CHOL 222 (H) 03/29/2016   TRIG 52.0 03/29/2016   HDL 79.40 03/29/2016   LDLDIRECT 132.7 03/24/2013   LDLCALC 132 (H) 03/29/2016   ALT 16  03/29/2016   AST 19 03/29/2016   NA 141 03/29/2016   K 4.3 03/29/2016   CL 102 03/29/2016   CREATININE 0.75 03/29/2016   BUN 16 03/29/2016   CO2 32 03/29/2016   TSH 1.29 03/29/2016    ASSESSMENT AND PLAN:  Discussed the following assessment and plan:  Visit for preventive health examination  Elevated alkaline phosphatase measurement - Plan: Hepatic function panel  Influenza vaccination declined by patient Hx of same nl vit d  Minor elevation follow   Patient Care Team: Burnis Medin, MD as PCP - General Kem Boroughs, FNP as Nurse Practitioner (Nurse Practitioner) Rolan Bucco, MD (Inactive) as Consulting Physician (Urology) Sheralyn Boatman, MD as Consulting Physician (Psychiatry) Doran Stabler, MD as Consulting Physician (Gastroenterology) Cleon Gustin, MD as Consulting Physician (Urology) Patient Instructions  Continue lifestyle intervention healthy eating and exercise .  Can try    Alternating one or 2 days a week with ranitidine 150 mg  Twice a day   And omeprazole the other days .  Uncertain significance at the slightly elevated alkaline phosphatase and your liver panel. Recheck blood tests and liver panel in 3 months I will put in order you do not  Fast.  Health Maintenance, Female Introduction Adopting a healthy lifestyle and getting preventive care can go a long way to promote health and wellness. Talk with your health care provider about what schedule of regular examinations is right for you. This is a good chance for you to check in with your  provider about disease prevention and staying healthy. In between checkups, there are plenty of things you can do on your own. Experts have done a lot of research about which lifestyle changes and preventive measures are most likely to keep you healthy. Ask your health care provider for more information. Weight and diet Eat a healthy diet  Be sure to include plenty of vegetables, fruits, low-fat dairy products, and lean protein.  Do not eat a lot of foods high in solid fats, added sugars, or salt.  Get regular exercise. This is one of the most important things you can do for your health.  Most adults should exercise for at least 150 minutes each week. The exercise should increase your heart rate and make you sweat (moderate-intensity exercise).  Most adults should also do strengthening exercises at least twice a week. This is in addition to the moderate-intensity exercise. Maintain a healthy weight  Body mass index (BMI) is a measurement that can be used to identify possible weight problems. It estimates body fat based on height and weight. Your health care provider can help determine your BMI and help you achieve or maintain a healthy weight.  For females 23 years of age and older:  A BMI below 18.5 is considered underweight.  A BMI of 18.5 to 24.9 is normal.  A BMI of 25 to 29.9 is considered overweight.  A BMI of 30 and above is considered obese. Watch levels of cholesterol and blood lipids  You should start having your blood tested for lipids and cholesterol at 61 years of age, then have this test every 5 years.  You may need to have your cholesterol levels checked more often if:  Your lipid or cholesterol levels are high.  You are older than 61 years of age.  You are at high risk for heart disease. Cancer screening Lung Cancer  Lung cancer screening is recommended for adults 57-62 years old who are at high risk for  lung cancer because of a history of smoking.  A yearly  low-dose CT scan of the lungs is recommended for people who:  Currently smoke.  Have quit within the past 15 years.  Have at least a 30-pack-year history of smoking. A pack year is smoking an average of one pack of cigarettes a day for 1 year.  Yearly screening should continue until it has been 15 years since you quit.  Yearly screening should stop if you develop a health problem that would prevent you from having lung cancer treatment. Breast Cancer  Practice breast self-awareness. This means understanding how your breasts normally appear and feel.  It also means doing regular breast self-exams. Let your health care provider know about any changes, no matter how small.  If you are in your 20s or 30s, you should have a clinical breast exam (CBE) by a health care provider every 1-3 years as part of a regular health exam.  If you are 22 or older, have a CBE every year. Also consider having a breast X-ray (mammogram) every year.  If you have a family history of breast cancer, talk to your health care provider about genetic screening.  If you are at high risk for breast cancer, talk to your health care provider about having an MRI and a mammogram every year.  Breast cancer gene (BRCA) assessment is recommended for women who have family members with BRCA-related cancers. BRCA-related cancers include:  Breast.  Ovarian.  Tubal.  Peritoneal cancers.  Results of the assessment will determine the need for genetic counseling and BRCA1 and BRCA2 testing. Cervical Cancer  Your health care provider may recommend that you be screened regularly for cancer of the pelvic organs (ovaries, uterus, and vagina). This screening involves a pelvic examination, including checking for microscopic changes to the surface of your cervix (Pap test). You may be encouraged to have this screening done every 3 years, beginning at age 58.  For women ages 68-65, health care providers may recommend pelvic exams  and Pap testing every 3 years, or they may recommend the Pap and pelvic exam, combined with testing for human papilloma virus (HPV), every 5 years. Some types of HPV increase your risk of cervical cancer. Testing for HPV may also be done on women of any age with unclear Pap test results.  Other health care providers may not recommend any screening for nonpregnant women who are considered low risk for pelvic cancer and who do not have symptoms. Ask your health care provider if a screening pelvic exam is right for you.  If you have had past treatment for cervical cancer or a condition that could lead to cancer, you need Pap tests and screening for cancer for at least 20 years after your treatment. If Pap tests have been discontinued, your risk factors (such as having a new sexual partner) need to be reassessed to determine if screening should resume. Some women have medical problems that increase the chance of getting cervical cancer. In these cases, your health care provider may recommend more frequent screening and Pap tests. Colorectal Cancer  This type of cancer can be detected and often prevented.  Routine colorectal cancer screening usually begins at 61 years of age and continues through 61 years of age.  Your health care provider may recommend screening at an earlier age if you have risk factors for colon cancer.  Your health care provider may also recommend using home test kits to check for hidden blood in the stool.  A small camera at the end of a tube can be used to examine your colon directly (sigmoidoscopy or colonoscopy). This is done to check for the earliest forms of colorectal cancer.  Routine screening usually begins at age 18.  Direct examination of the colon should be repeated every 5-10 years through 61 years of age. However, you may need to be screened more often if early forms of precancerous polyps or small growths are found. Skin Cancer  Check your skin from head to toe  regularly.  Tell your health care provider about any new moles or changes in moles, especially if there is a change in a mole's shape or color.  Also tell your health care provider if you have a mole that is larger than the size of a pencil eraser.  Always use sunscreen. Apply sunscreen liberally and repeatedly throughout the day.  Protect yourself by wearing long sleeves, pants, a wide-brimmed hat, and sunglasses whenever you are outside. Heart disease, diabetes, and high blood pressure  High blood pressure causes heart disease and increases the risk of stroke. High blood pressure is more likely to develop in:  People who have blood pressure in the high end of the normal range (130-139/85-89 mm Hg).  People who are overweight or obese.  People who are African American.  If you are 19-29 years of age, have your blood pressure checked every 3-5 years. If you are 36 years of age or older, have your blood pressure checked every year. You should have your blood pressure measured twice-once when you are at a hospital or clinic, and once when you are not at a hospital or clinic. Record the average of the two measurements. To check your blood pressure when you are not at a hospital or clinic, you can use:  An automated blood pressure machine at a pharmacy.  A home blood pressure monitor.  If you are between 17 years and 46 years old, ask your health care provider if you should take aspirin to prevent strokes.  Have regular diabetes screenings. This involves taking a blood sample to check your fasting blood sugar level.  If you are at a normal weight and have a low risk for diabetes, have this test once every three years after 61 years of age.  If you are overweight and have a high risk for diabetes, consider being tested at a younger age or more often. Preventing infection Hepatitis B  If you have a higher risk for hepatitis B, you should be screened for this virus. You are considered at  high risk for hepatitis B if:  You were born in a country where hepatitis B is common. Ask your health care provider which countries are considered high risk.  Your parents were born in a high-risk country, and you have not been immunized against hepatitis B (hepatitis B vaccine).  You have HIV or AIDS.  You use needles to inject street drugs.  You live with someone who has hepatitis B.  You have had sex with someone who has hepatitis B.  You get hemodialysis treatment.  You take certain medicines for conditions, including cancer, organ transplantation, and autoimmune conditions. Hepatitis C  Blood testing is recommended for:  Everyone born from [birth year] through 1966.  Anyone with known risk factors for hepatitis C. Sexually transmitted infections (STIs)  You should be screened for sexually transmitted infections (STIs) including gonorrhea and chlamydia if:  You are sexually active and are younger than 61 years of age.  You are older than 61 years of age and your health care provider tells you that you are at risk for this type of infection.  Your sexual activity has changed since you were last screened and you are at an increased risk for chlamydia or gonorrhea. Ask your health care provider if you are at risk.  If you do not have HIV, but are at risk, it may be recommended that you take a prescription medicine daily to prevent HIV infection. This is called pre-exposure prophylaxis (PrEP). You are considered at risk if:  You are sexually active and do not regularly use condoms or know the HIV status of your partner(s).  You take drugs by injection.  You are sexually active with a partner who has HIV. Talk with your health care provider about whether you are at high risk of being infected with HIV. If you choose to begin PrEP, you should first be tested for HIV. You should then be tested every 3 months for as long as you are taking PrEP. Pregnancy  If you are premenopausal and  you may become pregnant, ask your health care provider about preconception counseling.  If you may become pregnant, take 400 to 800 micrograms (mcg) of folic acid every day.  If you want to prevent pregnancy, talk to your health care provider about birth control (contraception). Osteoporosis and menopause  Osteoporosis is a disease in which the bones lose minerals and strength with aging. This can result in serious bone fractures. Your risk for osteoporosis can be identified using a bone density scan.  If you are 69 years of age or older, or if you are at risk for osteoporosis and fractures, ask your health care provider if you should be screened.  Ask your health care provider whether you should take a calcium or vitamin D supplement to lower your risk for osteoporosis.  Menopause may have certain physical symptoms and risks.  Hormone replacement therapy may reduce some of these symptoms and risks. Talk to your health care provider about whether hormone replacement therapy is right for you. Follow these instructions at home:  Schedule regular health, dental, and eye exams.  Stay current with your immunizations.  Do not use any tobacco products including cigarettes, chewing tobacco, or electronic cigarettes.  If you are pregnant, do not drink alcohol.  If you are breastfeeding, limit how much and how often you drink alcohol.  Limit alcohol intake to no more than 1 drink per day for nonpregnant women. One drink equals 12 ounces of beer, 5 ounces of wine, or 1 ounces of hard liquor.  Do not use street drugs.  Do not share needles.  Ask your health care provider for help if you need support or information about quitting drugs.  Tell your health care provider if you often feel depressed.  Tell your health care provider if you have ever been abused or do not feel safe at home. This information is not intended to replace advice given to you by your health care provider. Make sure  you discuss any questions you have with your health care provider. Document Released: 11/20/2010 Document Revised: 10/13/2015 Document Reviewed: 02/08/2015  2017 [employer]    Mariann Laster K. Rawn Quiroa M.D.

## 2016-04-04 NOTE — Progress Notes (Signed)
Pre visit review using our clinic review tool, if applicable. No additional management support is needed unless otherwise documented below in the visit note. 

## 2016-04-04 NOTE — Patient Instructions (Addendum)
Continue lifestyle intervention healthy eating and exercise .  Can try    Alternating one or 2 days a week with ranitidine 150 mg  Twice a day   And omeprazole the other days .  Uncertain significance at the slightly elevated alkaline phosphatase and your liver panel. Recheck blood tests and liver panel in 3 months I will put in order you do not  Fast.  Health Maintenance, Female Introduction Adopting a healthy lifestyle and getting preventive care can go a long way to promote health and wellness. Talk with your health care provider about what schedule of regular examinations is right for you. This is a good chance for you to check in with your provider about disease prevention and staying healthy. In between checkups, there are plenty of things you can do on your own. Experts have done a lot of research about which lifestyle changes and preventive measures are most likely to keep you healthy. Ask your health care provider for more information. Weight and diet Eat a healthy diet  Be sure to include plenty of vegetables, fruits, low-fat dairy products, and lean protein.  Do not eat a lot of foods high in solid fats, added sugars, or salt.  Get regular exercise. This is one of the most important things you can do for your health.  Most adults should exercise for at least 150 minutes each week. The exercise should increase your heart rate and make you sweat (moderate-intensity exercise).  Most adults should also do strengthening exercises at least twice a week. This is in addition to the moderate-intensity exercise. Maintain a healthy weight  Body mass index (BMI) is a measurement that can be used to identify possible weight problems. It estimates body fat based on height and weight. Your health care provider can help determine your BMI and help you achieve or maintain a healthy weight.  For females 81 years of age and older:  A BMI below 18.5 is considered underweight.  A BMI of 18.5 to  24.9 is normal.  A BMI of 25 to 29.9 is considered overweight.  A BMI of 30 and above is considered obese. Watch levels of cholesterol and blood lipids  You should start having your blood tested for lipids and cholesterol at 61 years of age, then have this test every 5 years.  You may need to have your cholesterol levels checked more often if:  Your lipid or cholesterol levels are high.  You are older than 61 years of age.  You are at high risk for heart disease. Cancer screening Lung Cancer  Lung cancer screening is recommended for adults 87-86 years old who are at high risk for lung cancer because of a history of smoking.  A yearly low-dose CT scan of the lungs is recommended for people who:  Currently smoke.  Have quit within the past 15 years.  Have at least a 30-pack-year history of smoking. A pack year is smoking an average of one pack of cigarettes a day for 1 year.  Yearly screening should continue until it has been 15 years since you quit.  Yearly screening should stop if you develop a health problem that would prevent you from having lung cancer treatment. Breast Cancer  Practice breast self-awareness. This means understanding how your breasts normally appear and feel.  It also means doing regular breast self-exams. Let your health care provider know about any changes, no matter how small.  If you are in your 20s or 30s, you should have  a clinical breast exam (CBE) by a health care provider every 1-3 years as part of a regular health exam.  If you are 50 or older, have a CBE every year. Also consider having a breast X-ray (mammogram) every year.  If you have a family history of breast cancer, talk to your health care provider about genetic screening.  If you are at high risk for breast cancer, talk to your health care provider about having an MRI and a mammogram every year.  Breast cancer gene (BRCA) assessment is recommended for women who have family members  with BRCA-related cancers. BRCA-related cancers include:  Breast.  Ovarian.  Tubal.  Peritoneal cancers.  Results of the assessment will determine the need for genetic counseling and BRCA1 and BRCA2 testing. Cervical Cancer  Your health care provider may recommend that you be screened regularly for cancer of the pelvic organs (ovaries, uterus, and vagina). This screening involves a pelvic examination, including checking for microscopic changes to the surface of your cervix (Pap test). You may be encouraged to have this screening done every 3 years, beginning at age 24.  For women ages 23-65, health care providers may recommend pelvic exams and Pap testing every 3 years, or they may recommend the Pap and pelvic exam, combined with testing for human papilloma virus (HPV), every 5 years. Some types of HPV increase your risk of cervical cancer. Testing for HPV may also be done on women of any age with unclear Pap test results.  Other health care providers may not recommend any screening for nonpregnant women who are considered low risk for pelvic cancer and who do not have symptoms. Ask your health care provider if a screening pelvic exam is right for you.  If you have had past treatment for cervical cancer or a condition that could lead to cancer, you need Pap tests and screening for cancer for at least 20 years after your treatment. If Pap tests have been discontinued, your risk factors (such as having a new sexual partner) need to be reassessed to determine if screening should resume. Some women have medical problems that increase the chance of getting cervical cancer. In these cases, your health care provider may recommend more frequent screening and Pap tests. Colorectal Cancer  This type of cancer can be detected and often prevented.  Routine colorectal cancer screening usually begins at 61 years of age and continues through 61 years of age.  Your health care provider may recommend  screening at an earlier age if you have risk factors for colon cancer.  Your health care provider may also recommend using home test kits to check for hidden blood in the stool.  A small camera at the end of a tube can be used to examine your colon directly (sigmoidoscopy or colonoscopy). This is done to check for the earliest forms of colorectal cancer.  Routine screening usually begins at age 31.  Direct examination of the colon should be repeated every 5-10 years through 61 years of age. However, you may need to be screened more often if early forms of precancerous polyps or small growths are found. Skin Cancer  Check your skin from head to toe regularly.  Tell your health care provider about any new moles or changes in moles, especially if there is a change in a mole's shape or color.  Also tell your health care provider if you have a mole that is larger than the size of a pencil eraser.  Always use sunscreen. Apply  sunscreen liberally and repeatedly throughout the day.  Protect yourself by wearing long sleeves, pants, a wide-brimmed hat, and sunglasses whenever you are outside. Heart disease, diabetes, and high blood pressure  High blood pressure causes heart disease and increases the risk of stroke. High blood pressure is more likely to develop in:  People who have blood pressure in the high end of the normal range (130-139/85-89 mm Hg).  People who are overweight or obese.  People who are African American.  If you are 33-71 years of age, have your blood pressure checked every 3-5 years. If you are 7 years of age or older, have your blood pressure checked every year. You should have your blood pressure measured twice-once when you are at a hospital or clinic, and once when you are not at a hospital or clinic. Record the average of the two measurements. To check your blood pressure when you are not at a hospital or clinic, you can use:  An automated blood pressure machine at a  pharmacy.  A home blood pressure monitor.  If you are between 80 years and 23 years old, ask your health care provider if you should take aspirin to prevent strokes.  Have regular diabetes screenings. This involves taking a blood sample to check your fasting blood sugar level.  If you are at a normal weight and have a low risk for diabetes, have this test once every three years after 61 years of age.  If you are overweight and have a high risk for diabetes, consider being tested at a younger age or more often. Preventing infection Hepatitis B  If you have a higher risk for hepatitis B, you should be screened for this virus. You are considered at high risk for hepatitis B if:  You were born in a country where hepatitis B is common. Ask your health care provider which countries are considered high risk.  Your parents were born in a high-risk country, and you have not been immunized against hepatitis B (hepatitis B vaccine).  You have HIV or AIDS.  You use needles to inject street drugs.  You live with someone who has hepatitis B.  You have had sex with someone who has hepatitis B.  You get hemodialysis treatment.  You take certain medicines for conditions, including cancer, organ transplantation, and autoimmune conditions. Hepatitis C  Blood testing is recommended for:  Everyone born from [birth year] through 1966.  Anyone with known risk factors for hepatitis C. Sexually transmitted infections (STIs)  You should be screened for sexually transmitted infections (STIs) including gonorrhea and chlamydia if:  You are sexually active and are younger than 61 years of age.  You are older than 61 years of age and your health care provider tells you that you are at risk for this type of infection.  Your sexual activity has changed since you were last screened and you are at an increased risk for chlamydia or gonorrhea. Ask your health care provider if you are at risk.  If you do not have  HIV, but are at risk, it may be recommended that you take a prescription medicine daily to prevent HIV infection. This is called pre-exposure prophylaxis (PrEP). You are considered at risk if:  You are sexually active and do not regularly use condoms or know the HIV status of your partner(s).  You take drugs by injection.  You are sexually active with a partner who has HIV. Talk with your health care provider about whether you are  at high risk of being infected with HIV. If you choose to begin PrEP, you should first be tested for HIV. You should then be tested every 3 months for as long as you are taking PrEP. Pregnancy  If you are premenopausal and you may become pregnant, ask your health care provider about preconception counseling.  If you may become pregnant, take 400 to 800 micrograms (mcg) of folic acid every day.  If you want to prevent pregnancy, talk to your health care provider about birth control (contraception). Osteoporosis and menopause  Osteoporosis is a disease in which the bones lose minerals and strength with aging. This can result in serious bone fractures. Your risk for osteoporosis can be identified using a bone density scan.  If you are 63 years of age or older, or if you are at risk for osteoporosis and fractures, ask your health care provider if you should be screened.  Ask your health care provider whether you should take a calcium or vitamin D supplement to lower your risk for osteoporosis.  Menopause may have certain physical symptoms and risks.  Hormone replacement therapy may reduce some of these symptoms and risks. Talk to your health care provider about whether hormone replacement therapy is right for you. Follow these instructions at home:  Schedule regular health, dental, and eye exams.  Stay current with your immunizations.  Do not use any tobacco products including cigarettes, chewing tobacco, or electronic cigarettes.  If you are pregnant, do not  drink alcohol.  If you are breastfeeding, limit how much and how often you drink alcohol.  Limit alcohol intake to no more than 1 drink per day for nonpregnant women. One drink equals 12 ounces of beer, 5 ounces of wine, or 1 ounces of hard liquor.  Do not use street drugs.  Do not share needles.  Ask your health care provider for help if you need support or information about quitting drugs.  Tell your health care provider if you often feel depressed.  Tell your health care provider if you have ever been abused or do not feel safe at home. This information is not intended to replace advice given to you by your health care provider. Make sure you discuss any questions you have with your health care provider. Document Released: 11/20/2010 Document Revised: 10/13/2015 Document Reviewed: 02/08/2015  2017 [employer]vier

## 2016-05-09 ENCOUNTER — Ambulatory Visit: Payer: BC Managed Care – PPO | Admitting: Nurse Practitioner

## 2016-07-04 ENCOUNTER — Ambulatory Visit (INDEPENDENT_AMBULATORY_CARE_PROVIDER_SITE_OTHER): Payer: BC Managed Care – PPO | Admitting: Nurse Practitioner

## 2016-07-04 ENCOUNTER — Encounter: Payer: Self-pay | Admitting: Nurse Practitioner

## 2016-07-04 VITALS — BP 110/60 | HR 84 | Resp 14 | Ht 64.75 in | Wt 170.2 lb

## 2016-07-04 DIAGNOSIS — Z0000 Encounter for general adult medical examination without abnormal findings: Secondary | ICD-10-CM

## 2016-07-04 DIAGNOSIS — Z01419 Encounter for gynecological examination (general) (routine) without abnormal findings: Secondary | ICD-10-CM

## 2016-07-04 LAB — HEPATIC FUNCTION PANEL
ALT: 13 U/L (ref 6–29)
AST: 18 U/L (ref 10–35)
Albumin: 4.2 g/dL (ref 3.6–5.1)
Alkaline Phosphatase: 106 U/L (ref 33–130)
Bilirubin, Direct: 0.1 mg/dL (ref <=0.2)
Indirect Bilirubin: 0.3 mg/dL (ref 0.2–1.2)
Total Bilirubin: 0.4 mg/dL (ref 0.2–1.2)
Total Protein: 6.8 g/dL (ref 6.1–8.1)

## 2016-07-04 NOTE — Progress Notes (Signed)
Patient ID: [name] [name], female   DOB: 05-Dec-1955, 60 y.o.   MRN: [id number]  61 y.o. [id number] Divorced  Caucasian Fe here for annual exam.  No new health problems other than liver function test abnormal with an elevated Alk Phos this past year.  She was to have a recheck tomorrow but has to take mother for an apt.  We will do here today and send to PCP the results.  Patient's last menstrual period was 11/18/2004.          Sexually active: No.  The current method of family planning is post menopausal status.    Exercising: Yes.    walking Smoker:  no  Health Maintenance: Pap: 01/04/15, Negative with neg HR HPV  08/2011, Negative with neg HR HPV MMG: 03/01/16, 3D, Bi-Rads 1: Negative Colonoscopy: 02/27/13, Diverticulitis - Repeat 5 years  BMD: 11/10/09 T Score: -1.8 Spine / -0.9 Left Femur Neck TDaP: 05/13/13 Shingles: postponed by PCP office Hep C and HIV: done today Labs: PCP takes care of labs   reports that she has never smoked. She has never used smokeless tobacco. She reports that she drinks alcohol. She reports that she does not use drugs.  Past Medical History:  Diagnosis Date  . Arthritis   . Depression    stable on medication  . Diverticulitis 2015  . Diverticulosis   . Gallstones   . GERD (gastroesophageal reflux disease)   . Hyperlipidemia    no meds needed  . IBS (irritable bowel syndrome)   . Osteopenia   . PUD (peptic ulcer disease) 1980's  . Renal cyst    4.5-6 cm stable single  seen by uro in past    Past Surgical History:  Procedure Laterality Date  . CHOLECYSTECTOMY  1987  . COLONOSCOPY    . IRRIGATION AND DEBRIDEMENT SEBACEOUS CYST Right 3/ 2014   chest wall  . UPPER GASTROINTESTINAL ENDOSCOPY    . WISDOM TOOTH EXTRACTION  age 64 & 47   lower teeth done first and upper wisdom teeth last    Current Outpatient Prescriptions  Medication Sig Dispense Refill  . Cholecalciferol (VITAMIN D PO) Take 2,000 mg by mouth every other day.     Marland Kitchen FLUoxetine  (PROZAC) 40 MG capsule Take 40 mg by mouth daily.     . Multiple Vitamins-Minerals (CENTRUM ULTRA WOMENS PO) Take 1 tablet by mouth every other day.     Marland Kitchen omeprazole (PRILOSEC) 20 MG capsule Take 1 capsule (20 mg total) by mouth daily. 30 capsule 11  . Probiotic Product (PROBIOTIC PO) Take by mouth daily.    . propranolol (INDERAL) 10 MG tablet Take 10 mg by mouth as needed.      No current facility-administered medications for this visit.     Family History  Problem Relation Age of Onset  . Rheum arthritis Father   . Pneumonia Father     deceased  . Colon cancer Father 43  . Heart attack Father   . Breast cancer Sister 48  . Breast cancer Paternal Grandmother 20  . Osteoarthritis Mother   . COPD Mother   . Pneumonia Mother   . Osteoporosis Mother   . Cancer Maternal Grandmother     ? GI  . Heart failure Maternal Grandfather   . Heart failure Paternal Grandfather   . Esophageal cancer Neg Hx   . Rectal cancer Neg Hx   . Stomach cancer Neg Hx     ROS:  Pertinent items are noted in  HPI.  Otherwise, a comprehensive ROS was negative.  Exam:   BP 110/60 (BP Location: Right Arm, Patient Position: Sitting, Cuff Size: Large)   Pulse 84   Resp 14   Ht 5' 4.75" (1.645 m)   Wt 170 lb 3.2 oz (77.2 kg)   LMP 11/18/2004   BMI 28.54 kg/m  Height: 5' 4.75" (164.5 cm) Ht Readings from Last 3 Encounters:  07/04/16 5' 4.75" (1.645 m)  04/04/16 '5\' 5"'$  (1.651 m)  01/25/16 5' 6.5" (1.689 m)    General appearance: alert, cooperative and appears stated age Head: Normocephalic, without obvious abnormality, atraumatic Neck: no adenopathy, supple, symmetrical, trachea midline and thyroid normal to inspection and palpation Lungs: clear to auscultation bilaterally Breasts: normal appearance, no masses or tenderness Heart: regular rate and rhythm Abdomen: soft, non-tender; no masses,  no organomegaly Extremities: extremities normal, atraumatic, no cyanosis or edema Skin: Skin color, texture,  turgor normal. No rashes or lesions Lymph nodes: Cervical, supraclavicular, and axillary nodes normal. No abnormal inguinal nodes palpated Neurologic: Grossly normal   Pelvic: External genitalia:  no lesions              Urethra:  normal appearing urethra with no masses, tenderness or lesions              Bartholin's and Skene's: normal                 Vagina: normal appearing vagina with normal color and discharge, no lesions              Cervix: anteverted              Pap taken: No. Bimanual Exam:  Uterus:  normal size, contour, position, consistency, mobility, non-tender              Adnexa: no mass, fullness, tenderness               Rectovaginal: Confirms               Anus:  normal sphincter tone, no lesions  Chaperone present: yes  A:  Well Woman with normal exam  Postmenopausal no HRT History of renal cyst - saw Dr. Jasmine December, last CT scan shows benign 4.2 X 4.8 X 5.5 cm left upper pole 2015 Not SA; atrophic vaginitis History of Vit d deficiency - on OTC Vit D now             Diverticulosis   Recent abnormal liver function   P:   Reviewed health and wellness pertinent to exam  Pap smear not done  Mammogram is due 02/2017  She will get shingles at pharmacy  Follow with labs  Counseled on breast self exam, mammography screening, adequate intake of calcium and vitamin D, diet and exercise return annually or prn  An After Visit Summary was printed and given to the patient.

## 2016-07-04 NOTE — Patient Instructions (Signed)

## 2016-07-05 ENCOUNTER — Other Ambulatory Visit: Payer: BC Managed Care – PPO

## 2016-07-05 LAB — HIV ANTIBODY (ROUTINE TESTING W REFLEX): HIV 1&2 Ab, 4th Generation: NONREACTIVE

## 2016-07-05 LAB — HEPATITIS C ANTIBODY: HCV Ab: NEGATIVE

## 2016-07-08 NOTE — Progress Notes (Signed)
Encounter reviewed by Dr. Mariaisabel Bodiford Amundson C. Silva.  

## 2016-07-10 ENCOUNTER — Telehealth: Payer: Self-pay | Admitting: *Deleted

## 2016-07-10 NOTE — Telephone Encounter (Signed)
I have attempted to contact this patient by phone with the following results: left message to return call to Short at [phone] answering machine (home per [facility]).  Advised call was regarding recent labs. [phone] (Home) *Preferred*

## 2016-07-10 NOTE — Telephone Encounter (Signed)
-----   Message from Kem [place], [place] sent at 07/06/2016  7:52 AM EST ----- Please let pt know that HIV and Hep C are negative as expected.  The Alkaline Phosphatase liver test was normal and will be forwarded to PCP since she was unable to get to lab apt at PCP the other day due to mothers apt.

## 2016-07-11 NOTE — Telephone Encounter (Signed)
Pt notified in result note.  Closing encounter. 

## 2016-07-25 ENCOUNTER — Other Ambulatory Visit: Payer: BC Managed Care – PPO

## 2016-10-10 ENCOUNTER — Encounter: Payer: Self-pay | Admitting: Internal Medicine

## 2016-10-10 ENCOUNTER — Ambulatory Visit (INDEPENDENT_AMBULATORY_CARE_PROVIDER_SITE_OTHER): Payer: BC Managed Care – PPO | Admitting: Internal Medicine

## 2016-10-10 ENCOUNTER — Encounter: Payer: Self-pay | Admitting: Emergency Medicine

## 2016-10-10 VITALS — BP 110/64 | HR 78 | Temp 98.4°F | Ht 64.75 in | Wt 168.9 lb

## 2016-10-10 DIAGNOSIS — R1033 Periumbilical pain: Secondary | ICD-10-CM | POA: Diagnosis not present

## 2016-10-10 DIAGNOSIS — R197 Diarrhea, unspecified: Secondary | ICD-10-CM

## 2016-10-10 DIAGNOSIS — Z8719 Personal history of other diseases of the digestive system: Secondary | ICD-10-CM | POA: Diagnosis not present

## 2016-10-10 NOTE — Progress Notes (Signed)
Chief Complaint  Patient presents with  . Abdominal Pain    slight fever/ sx started monday afternoon  . Diarrhea    HPI: CLYDEAN POSAS 61 y.o.  sda  Onset less than 48 hours ago of epigastric periumbilical severe pain almost took her to the emergency that then subsided she began to have frequent watery diarrhea. At this time there is some form to it but no blood has decreased appetite but no vomiting fever chills although may have had some fever at the beginning. She has had a history of diverticulitis although this may be different. She also has a history of ulcer disease. This is remote.  No bleeding  hx abd pain gerd dr Loletha Carrow   And had gyne check feb  Hx of diverticulitis  And ?PUD ROS: See pertinent positives and negatives per HPI.  Past Medical History:  Diagnosis Date  . Arthritis   . Depression    stable on medication  . Diverticulitis 2015  . Diverticulosis   . Gallstones   . GERD (gastroesophageal reflux disease)   . Hyperlipidemia    no meds needed  . IBS (irritable bowel syndrome)   . Osteopenia   . PUD (peptic ulcer disease) 1980's  . Renal cyst    4.5-6 cm stable single  seen by uro in past    Family History  Problem Relation Age of Onset  . Rheum arthritis Father   . Pneumonia Father        deceased  . Colon cancer Father 73  . Heart attack Father   . Breast cancer Sister 76  . Breast cancer Paternal Grandmother 55  . Osteoarthritis Mother   . COPD Mother   . Pneumonia Mother   . Osteoporosis Mother   . Cancer Maternal Grandmother        ? GI  . Heart failure Maternal Grandfather   . Heart failure Paternal Grandfather   . Esophageal cancer Neg Hx   . Rectal cancer Neg Hx   . Stomach cancer Neg Hx     Social History   Social History  . Marital status: Divorced    Spouse name: N/A  . Number of children: 0  . Years of education: N/A   Occupational History  . [employer]   Social History Main Topics  . Smoking  status: Never Smoker  . Smokeless tobacco: Never Used  . Alcohol use Yes     Comment: occasionally  . Drug use: No  . Sexual activity: No   Other Topics Concern  . None   Social History Narrative   Copy.   Non smoker    Hh of 1 ... 2 cats    Single   G0P0   Mom is now in assisted living and she has moved into her old residence    Outpatient Medications Prior to Visit  Medication Sig Dispense Refill  . Cholecalciferol (VITAMIN D PO) Take 2,000 mg by mouth every other day.     Marland Kitchen FLUoxetine (PROZAC) 40 MG capsule Take 40 mg by mouth daily.     . Multiple Vitamins-Minerals (CENTRUM ULTRA WOMENS PO) Take 1 tablet by mouth every other day.     Marland Kitchen omeprazole (PRILOSEC) 20 MG capsule Take 1 capsule (20 mg total) by mouth daily. 30 capsule 11  . Probiotic Product (PROBIOTIC PO) Take by mouth daily.    . propranolol (INDERAL) 10 MG tablet Take 10 mg by mouth  as needed.      No facility-administered medications prior to visit.      EXAM:  BP 110/64 (BP Location: Right Arm, Patient Position: Sitting, Cuff Size: Normal)   Pulse 78   Temp 98.4 F (36.9 C) (Oral)   Ht 5' 4.75" (1.645 m)   Wt 168 lb 14.4 oz (76.6 kg)   LMP 11/18/2004   BMI 28.32 kg/m   Body mass index is 28.32 kg/m.  GENERAL: vitals reviewed and listed above, alert, oriented, appears well hydrated and in no acute distress HEENT: atraumatic, conjunctiva  clear, no obvious abnormalities on inspection of external nose and ears  NECK: no obvious masses on inspection palpation  LUNGS: clear to auscultation bilaterally, no wheezes, rales or rhonchi, good air movement CV: HRRR, no clubbing cyanosis or  peripheral edema nl cap refill  Abdomen soft bowel sounds present slightly decrease no masses or hepatosplenomegaly guarding or rebound points to the mid epigastrium periumbilical area as the area of tenderness. Negative psoas sign. Skin normal capillary refill  nonicteric. MS: moves all extremities without noticeable focal  abnormality PSYCH: pleasant and cooperative, no obvious depression or anxiety Lab Results  Component Value Date   WBC 5.0 03/29/2016   HGB 13.4 03/29/2016   HCT 39.9 03/29/2016   PLT 256.0 03/29/2016   GLUCOSE 98 03/29/2016   CHOL 222 (H) 03/29/2016   TRIG 52.0 03/29/2016   HDL 79.40 03/29/2016   LDLDIRECT 132.7 03/24/2013   LDLCALC 132 (H) 03/29/2016   ALT 13 07/04/2016   AST 18 07/04/2016   NA 141 03/29/2016   K 4.3 03/29/2016   CL 102 03/29/2016   CREATININE 0.75 03/29/2016   BUN 16 03/29/2016   CO2 32 03/29/2016   TSH 1.29 03/29/2016     ASSESSMENT AND PLAN:  Discussed the following assessment and plan:  Periumbilical abdominal pain - Plan: CBC with Differential/Platelet, Comprehensive metabolic panel, Sedimentation rate  Acute diarrhea - Plan: CBC with Differential/Platelet, Comprehensive metabolic panel, Sedimentation rate  Hx of diverticulitis of colon Hx of diverticulitis . This does not sound typical of this however close observation is warranted follow-up if worsening localizing pain fever etc. Today we'll get blood work baseline  Sx  treatment and follow-up.  -Patient advised to return or notify health care team  if symptoms worsen ,persist or new concerns arise.  Patient Instructions  This sounds more like stomach bug infection as opposed to diverticulitis or something more serious however I advise close observation. Stay on clear liquids hydrate but minimize solids at this time until feeling better  Most  Gi infections resolve on own with time.  Routine blood count today if pain becomes severe or localizing high fever or blood in the stool seek emergent care. You can check back with [place] on Friday to not sure before the weekend. Can stay on acid blocker at this time for now.    Standley Brooking. Panosh M.D.

## 2016-10-10 NOTE — Patient Instructions (Addendum)
This sounds more like stomach bug infection as opposed to diverticulitis or something more serious however I advise close observation. Stay on clear liquids hydrate but minimize solids at this time until feeling better  Most  Gi infections resolve on own with time.  Routine blood count today if pain becomes severe or localizing high fever or blood in the stool seek emergent care. You can check back with [place] on Friday to not sure before the weekend. Can stay on acid blocker at this time for now.

## 2016-10-11 LAB — COMPREHENSIVE METABOLIC PANEL
ALT: 17 U/L (ref 0–35)
AST: 23 U/L (ref 0–37)
Albumin: 4.2 g/dL (ref 3.5–5.2)
Alkaline Phosphatase: 95 U/L (ref 39–117)
BUN: 13 mg/dL (ref 6–23)
CO2: 29 mEq/L (ref 19–32)
Calcium: 9.2 mg/dL (ref 8.4–10.5)
Chloride: 99 mEq/L (ref 96–112)
Creatinine, Ser: 0.75 mg/dL (ref 0.40–1.20)
GFR: 83.29 mL/min (ref >60.00)
Glucose, Bld: 87 mg/dL (ref 70–99)
Potassium: 3.9 mEq/L (ref 3.5–5.1)
Sodium: 137 mEq/L (ref 135–145)
Total Bilirubin: 0.4 mg/dL (ref 0.2–1.2)
Total Protein: 6.8 g/dL (ref 6.0–8.3)

## 2016-10-11 LAB — CBC WITH DIFFERENTIAL/PLATELET
Basophils Absolute: 0 10*3/uL (ref 0.0–0.1)
Basophils Relative: 0.3 % (ref 0.0–3.0)
Eosinophils Absolute: 0.1 10*3/uL (ref 0.0–0.7)
Eosinophils Relative: 1.9 % (ref 0.0–5.0)
HCT: 40.3 % (ref 36.0–46.0)
Hemoglobin: 13.3 g/dL (ref 12.0–15.0)
Lymphocytes Relative: 17.9 % (ref 12.0–46.0)
Lymphs Abs: 1.1 10*3/uL (ref 0.7–4.0)
MCHC: 33 g/dL (ref 30.0–36.0)
MCV: 89.5 fl (ref 78.0–100.0)
Monocytes Absolute: 0.8 10*3/uL (ref 0.1–1.0)
Monocytes Relative: 12.9 % — ABNORMAL HIGH (ref 3.0–12.0)
Neutro Abs: 4.2 10*3/uL (ref 1.4–7.7)
Neutrophils Relative %: 67 % (ref 43.0–77.0)
Platelets: 237 10*3/uL (ref 150.0–400.0)
RBC: 4.5 Mil/uL (ref 3.87–5.11)
RDW: 13.6 % (ref 11.5–15.5)
WBC: 6.2 10*3/uL (ref 4.0–10.5)

## 2016-10-11 LAB — SEDIMENTATION RATE: Sed Rate: 15 mm/hr (ref 0–30)

## 2016-11-03 ENCOUNTER — Other Ambulatory Visit: Payer: Self-pay | Admitting: Gastroenterology

## 2016-11-05 NOTE — Telephone Encounter (Signed)
Refill request for omeprazole 20 mg one a day. Last seen 11-04-2015. Follow up O/V made for 12-03-2016 @ 4 pm. Pt aware one moth supply has been given until follow up and she must keep appt to get additional refills.

## 2016-12-03 ENCOUNTER — Encounter (INDEPENDENT_AMBULATORY_CARE_PROVIDER_SITE_OTHER): Payer: Self-pay

## 2016-12-03 ENCOUNTER — Encounter: Payer: Self-pay | Admitting: Gastroenterology

## 2016-12-03 ENCOUNTER — Ambulatory Visit (INDEPENDENT_AMBULATORY_CARE_PROVIDER_SITE_OTHER): Payer: BC Managed Care – PPO | Admitting: Gastroenterology

## 2016-12-03 VITALS — BP 110/66 | HR 84 | Ht 65.0 in | Wt 172.4 lb

## 2016-12-03 DIAGNOSIS — K219 Gastro-esophageal reflux disease without esophagitis: Secondary | ICD-10-CM | POA: Diagnosis not present

## 2016-12-03 DIAGNOSIS — Z800 Family history of malignant neoplasm of digestive organs: Secondary | ICD-10-CM

## 2016-12-03 MED ORDER — OMEPRAZOLE 20 MG PO CPDR: 20.0000 mg | DELAYED_RELEASE_CAPSULE | Freq: Every day | ORAL | 11 refills | Status: DC

## 2016-12-03 NOTE — Patient Instructions (Signed)
If you are age 62 or older, your body mass index should be between 23-30. Your Body mass index is 28.68 kg/m. If this is out of the aforementioned range listed, please consider follow up with your Primary Care Provider.  If you are age 7 or younger, your body mass index should be between 19-25. Your Body mass index is 28.68 kg/m. If this is out of the aformentioned range listed, please consider follow up with your Primary Care Provider.   Thank you for choosing [facility]  Dr Wilfrid Lund III

## 2016-12-03 NOTE — Progress Notes (Signed)
     [facility] Progress Note  Chief Complaint: GERD  Subjective  History:  This is one-year follow-up for 61 year old woman who has years of chronic GERD symptoms with mainly pyrosis. She is maintained on omeprazole 20 mg once daily. She has occasional breakthrough symptoms a couple times a week. [place] denies dysphagia, odynophagia, nausea, vomiting, early satiety or weight loss. In late May of this year she had acute onset abdominal pain with diarrhea that was felt likely to be a viral illness. Her primary care doctor apparently expressed some concern about the chronic PPI use and thought she should consider de-escalation.  ROS: Cardiovascular:  no chest pain Respiratory: no dyspnea  The patient's Past Medical, Family and Social History were reviewed and are on file in the EMR.  Objective:  Med list reviewed  Vital signs in last 24 hrs: Vitals:   12/03/16 1600  BP: 110/66  Pulse: 84    Physical Exam    HEENT: sclera anicteric, oral mucosa moist without lesions  Neck: supple, no thyromegaly, JVD or lymphadenopathy  Cardiac: RRR without murmurs, S1S2 heard, no peripheral edema  Pulm: clear to auscultation bilaterally, normal RR and effort noted  Abdomen: soft, No tenderness, with active bowel sounds. No guarding or palpable hepatosplenomegaly.  Skin; warm and dry, no jaundice or rash  Recent Labs:  CBC    Component Value Date/Time   WBC 6.2 10/10/2016 1541   RBC 4.50 10/10/2016 1541   HGB 13.3 10/10/2016 1541   HGB 13.2 11/25/2012 1032   HCT 40.3 10/10/2016 1541   PLT 237.0 10/10/2016 1541   MCV 89.5 10/10/2016 1541   MCHC 33.0 10/10/2016 1541   RDW 13.6 10/10/2016 1541   LYMPHSABS 1.1 10/10/2016 1541   MONOABS 0.8 10/10/2016 1541   EOSABS 0.1 10/10/2016 1541   BASOSABS 0.0 10/10/2016 1541      @ASSESSMENTPLANBEGIN @ Assessment: Encounter Diagnoses  Name Primary?  . Gastroesophageal reflux disease without esophagitis Yes  . Family history  of colon cancer in father    Chronic stable GERD symptoms. I discussed how she could de-escalation to every other day omeprazole alternating with ranitidine 150 mg. If symptoms are under good control after about 2 weeks of that, she could just go down to once daily ranitidine. She seems disinclined to do that since she has had such good symptom control on omeprazole.  She will be recalled in October 2019 for a colonoscopy due to family history of colon cancer. I will see her at that time unless she has problems sooner.  Total time 15 minutes, over half spent in counseling and coordination of care.   Nelida Meuse III

## 2017-02-19 ENCOUNTER — Other Ambulatory Visit (HOSPITAL_BASED_OUTPATIENT_CLINIC_OR_DEPARTMENT_OTHER): Payer: Self-pay | Admitting: Nurse Practitioner

## 2017-02-19 DIAGNOSIS — Z780 Asymptomatic menopausal state: Secondary | ICD-10-CM

## 2017-02-19 DIAGNOSIS — Z1231 Encounter for screening mammogram for malignant neoplasm of breast: Secondary | ICD-10-CM

## 2017-03-07 ENCOUNTER — Ambulatory Visit (HOSPITAL_BASED_OUTPATIENT_CLINIC_OR_DEPARTMENT_OTHER)
Admission: RE | Admit: 2017-03-07 | Discharge: 2017-03-07 | Disposition: A | Discharge status: Home / Self Care | Payer: BC Managed Care – PPO | Source: Ambulatory Visit | Attending: Nurse Practitioner | Admitting: Nurse Practitioner

## 2017-03-07 DIAGNOSIS — M8588 Other specified disorders of bone density and structure, other site: Secondary | ICD-10-CM | POA: Insufficient documentation

## 2017-03-07 DIAGNOSIS — E2839 Other primary ovarian failure: Secondary | ICD-10-CM | POA: Insufficient documentation

## 2017-03-07 DIAGNOSIS — Z780 Asymptomatic menopausal state: Secondary | ICD-10-CM

## 2017-03-07 DIAGNOSIS — Z1231 Encounter for screening mammogram for malignant neoplasm of breast: Secondary | ICD-10-CM | POA: Insufficient documentation

## 2017-04-10 ENCOUNTER — Telehealth: Payer: Self-pay | Admitting: Internal Medicine

## 2017-04-10 DIAGNOSIS — E785 Hyperlipidemia, unspecified: Secondary | ICD-10-CM

## 2017-04-10 NOTE — Telephone Encounter (Signed)
Hep C Panel from 11/2015 was never drawn.  Do you still want this done?  Pt also coming in on Monday 04/15/17 for lab draw but there are no labs entered -- do they need to keep this lab draw appt or cancel and have labs drawn after their visit with you on 11/28?

## 2017-04-15 ENCOUNTER — Other Ambulatory Visit: Payer: BC Managed Care – PPO

## 2017-04-15 NOTE — Telephone Encounter (Signed)
VM was left by lab personnel to return for lab draw.

## 2017-04-15 NOTE — Telephone Encounter (Signed)
I put io lipid panel  Only test needed  If patient chooses not to come a can do at her cpx  Visit Hep c has already been done

## 2017-04-16 NOTE — Progress Notes (Signed)
Chief Complaint  Patient presents with  . Annual Exam    no new concerns    HPI: Patient  [name] [name]  61 y.o. comes in today for Preventive Health Care visit   Gi stable  Diet related .  fluoxextine helpful   On since  Age 71 years .   Seems to help  For now   .   Health Maintenance  Topic Date Due  . INFLUENZA VACCINE  09/10/2017 (Originally 12/19/2016)  . PAP SMEAR  01/03/2018  . COLONOSCOPY  02/27/2018  . MAMMOGRAM  03/08/2019  . TETANUS/TDAP  05/14/2023  . Hepatitis C Screening  Completed  . HIV Screening  Completed   Health Maintenance Review LIFESTYLE:  Exercise:   30 minutes  Per day  Tobacco/ETS: no Alcohol:  irreg  On holiday  Sugar beverages: diet sodas   Sleep:  6-7  Drug use: no HH of  1.... 2 kitties passed away .  Work:  80 - 49      ROS:  GEN/ HEENT: No fever, significant weight changes sweats headaches vision problems hearing changes, CV/ PULM; No chest pain shortness of breath cough, syncope,edema  change in exercise tolerance. GI /GU: No adominal pain, vomiting, change in bowel habits. No blood in the stool. No significant GU symptoms. SKIN/HEME: ,no acute skin rashes suspicious lesions or bleeding. No lymphadenopathy, nodules, masses.  NEURO/ PSYCH:  No neurologic signs such as weakness numbness. No depression anxiety. IMM/ Allergy: No unusual infections.  Allergy .   REST of 12 system review negative except as per HPI   Past Medical History:  Diagnosis Date  . Arthritis   . Depression    stable on medication  . Diverticulitis 2015  . Diverticulosis   . Gallstones   . GERD (gastroesophageal reflux disease)   . Hyperlipidemia    no meds needed  . IBS (irritable bowel syndrome)   . Osteopenia   . PUD (peptic ulcer disease) 1980's  . Renal cyst    4.5-6 cm stable single  seen by uro in past    Past Surgical History:  Procedure Laterality Date  . CHOLECYSTECTOMY  1987  . COLONOSCOPY    . IRRIGATION AND DEBRIDEMENT SEBACEOUS  CYST Right 3/ 2014   chest wall  . UPPER GASTROINTESTINAL ENDOSCOPY    . WISDOM TOOTH EXTRACTION  age 59 & 65   lower teeth done first and upper wisdom teeth last    Family History  Problem Relation Age of Onset  . Rheum arthritis Father   . Pneumonia Father        deceased  . Colon cancer Father 38  . Heart attack Father   . Breast cancer Sister 40  . Breast cancer Paternal Grandmother 10  . Osteoarthritis Mother   . COPD Mother   . Pneumonia Mother   . Osteoporosis Mother   . Cancer Maternal Grandmother        ? GI  . Heart failure Maternal Grandfather   . Heart failure Paternal Grandfather   . Esophageal cancer Neg Hx   . Rectal cancer Neg Hx   . Stomach cancer Neg Hx     Social History   Socioeconomic History  . Marital status: Divorced    Spouse name: None  . Number of children: 0  . Years of education: None  . Highest education level: None  Social Needs  . Financial resource strain: None  . Food insecurity - worry: None  . Food insecurity -  inability: None  . Transportation needs - medical: None  . Transportation needs - non-medical: None  Occupational History  . Occupation: Product manager: Teaching laboratory technician [employer]  Tobacco Use  . Smoking status: Never Smoker  . Smokeless tobacco: Never Used  Substance and Sexual Activity  . Alcohol use: Yes    Comment: occasionally  . Drug use: No  . Sexual activity: No    Birth control/protection: Post-menopausal  Other Topics Concern  . None  Social History Narrative   Copy.   Non smoker    Hh of 1 ... 2 cats    Single   G0P0   Mom is now in assisted living and she has moved into her old residence    Outpatient Medications Prior to Visit  Medication Sig Dispense Refill  . Cholecalciferol (VITAMIN D PO) Take 2,000 mg by mouth every other day.     Marland Kitchen FLUoxetine (PROZAC) 40 MG capsule Take 40 mg by mouth daily.     . Multiple Vitamins-Minerals (CENTRUM  ULTRA WOMENS PO) Take 1 tablet by mouth every other day.     Marland Kitchen omeprazole (PRILOSEC) 20 MG capsule Take 1 capsule (20 mg total) by mouth daily. 30 capsule 11  . Probiotic Product (PROBIOTIC PO) Take by mouth daily.    . propranolol (INDERAL) 10 MG tablet Take 10 mg by mouth as needed.      No facility-administered medications prior to visit.      EXAM:  BP 120/70 (BP Location: Right Arm, Patient Position: Sitting, Cuff Size: Normal)   Pulse 70   Temp 98.2 F (36.8 C) (Oral)   Ht 5' 4.76" (1.645 m)   Wt 175 lb 14.4 oz (79.8 kg)   LMP 11/18/2004   BMI 29.49 kg/m   Body mass index is 29.49 kg/m. Wt Readings from Last 3 Encounters:  04/17/17 175 lb 14.4 oz (79.8 kg)  12/03/16 172 lb 6 oz (78.2 kg)  10/10/16 168 lb 14.4 oz (76.6 kg)    Physical Exam: Vital signs reviewed TCY:ELYH is a well-developed well-nourished alert cooperative    who appearsr stated age in no acute distress.  HEENT: normocephalic atraumatic , Eyes: PERRL EOM's full, conjunctiva clear, Nares: paten,t no deformity discharge or tenderness., Ears: no deformity EAC's clear TMs with normal landmarks. Mouth: clear OP, no lesions, edema.  Moist mucous membranes. Dentition in adequate repair. NECK: supple without masses, thyromegaly or bruits. CHEST/PULM:  Clear to auscultation and percussion breath sounds equal no wheeze , rales or rhonchi. No chest wall deformities or tenderness. Breast: normal by inspection . No dimpling, discharge, masses, tenderness or discharge . CV: PMI is nondisplaced, S1 S2 no gallops, murmurs, rubs. Peripheral pulses are full without delay.No JVD .  ABDOMEN: Bowel sounds normal nontender  No guard or rebound, no hepato splenomegal no CVA tenderness.  No hernia. Extremtities:  No clubbing cyanosis or edema, no acute joint swelling or redness no focal atrophy NEURO:  Oriented x3, cranial nerves 3-12 appear to be intact, no obvious focal weakness,gait within normal limits no abnormal reflexes or  asymmetrical SKIN: No acute rashes normal turgor, color, no bruising or petechiae. PSYCH: Oriented, good eye contact, no obvious depression anxiety, cognition and judgment appear normal. LN: no cervical axillary inguinal adenopathy  Lab Results  Component Value Date   WBC 6.2 10/10/2016   HGB 13.3 10/10/2016   HCT 40.3 10/10/2016   PLT 237.0 10/10/2016   GLUCOSE 87 10/10/2016  CHOL 222 (H) 03/29/2016   TRIG 52.0 03/29/2016   HDL 79.40 03/29/2016   LDLDIRECT 132.7 03/24/2013   LDLCALC 132 (H) 03/29/2016   ALT 17 10/10/2016   AST 23 10/10/2016   NA 137 10/10/2016   K 3.9 10/10/2016   CL 99 10/10/2016   CREATININE 0.75 10/10/2016   BUN 13 10/10/2016   CO2 29 10/10/2016   TSH 1.29 03/29/2016    BP Readings from Last 3 Encounters:  04/17/17 120/70  12/03/16 110/66  10/10/16 110/64      ASSESSMENT AND PLAN:  Discussed the following assessment and plan:  Visit for preventive health examination  Hyperlipidemia, unspecified hyperlipidemia type Patient Care Team: Burnis Medin, MD as PCP - Saralyn Pilar, FNP as Nurse Practitioner (Nurse Practitioner) Rolan Bucco, MD as Consulting Physician (Urology) Sheralyn Boatman, MD as Consulting Physician (Psychiatry) Loletha Carrow Kirke Corin, MD as Consulting Physician (Gastroenterology) Alyson Ingles Candee Furbish, MD as Consulting Physician (Urology) Patient Instructions  Get shingles  vaccine    Get fasting lipid panel.    Lab orders are in system.  Continue lifestyle intervention healthy eating and exercise .  Work on getting enough sleep .     Preventive Care 40-64 Years, Female Preventive care refers to lifestyle choices and visits with your health care provider that can promote health and wellness. What does preventive care include?  A yearly physical exam. This is also called an annual well check.  Dental exams once or twice a year.  Routine eye exams. Ask your health care provider how often you should  have your eyes checked.  Personal lifestyle choices, including: ? Daily care of your teeth and gums. ? Regular physical activity. ? Eating a healthy diet. ? Avoiding tobacco and drug use. ? Limiting alcohol use. ? Practicing safe sex. ? Taking low-dose aspirin daily starting at age 3. ? Taking vitamin and mineral supplements as recommended by your health care provider. What happens during an annual well check? The services and screenings done by your health care provider during your annual well check will depend on your age, overall health, lifestyle risk factors, and family history of disease. Counseling Your health care provider may ask you questions about your:  Alcohol use.  Tobacco use.  Drug use.  Emotional well-being.  Home and relationship well-being.  Sexual activity.  Eating habits.  Work and work Statistician.  Method of birth control.  Menstrual cycle.  Pregnancy history.  Screening You may have the following tests or measurements:  Height, weight, and BMI.  Blood pressure.  Lipid and cholesterol levels. These may be checked every 5 years, or more frequently if you are over 62 years old.  Skin check.  Lung cancer screening. You may have this screening every year starting at age 15 if you have a 30-pack-year history of smoking and currently smoke or have quit within the past 15 years.  Fecal occult blood test (FOBT) of the stool. You may have this test every year starting at age 65.  Flexible sigmoidoscopy or colonoscopy. You may have a sigmoidoscopy every 5 years or a colonoscopy every 10 years starting at age 69.  Hepatitis C blood test.  Hepatitis B blood test.  Sexually transmitted disease (STD) testing.  Diabetes screening. This is done by checking your blood sugar (glucose) after you have not eaten for a while (fasting). You may have this done every 1-3 years.  Mammogram. This may be done every 1-2 years. Talk to your health care  provider  about when you should start having regular mammograms. This may depend on whether you have a family history of breast cancer.  BRCA-related cancer screening. This may be done if you have a family history of breast, ovarian, tubal, or peritoneal cancers.  Pelvic exam and Pap test. This may be done every 3 years starting at age 21. Starting at age 91, this may be done every 5 years if you have a Pap test in combination with an HPV test.  Bone density scan. This is done to screen for osteoporosis. You may have this scan if you are at high risk for osteoporosis.  Discuss your test results, treatment options, and if necessary, the need for more tests with your health care provider. Vaccines Your health care provider may recommend certain vaccines, such as:  Influenza vaccine. This is recommended every year.  Tetanus, diphtheria, and acellular pertussis (Tdap, Td) vaccine. You may need a Td booster every 10 years.  Varicella vaccine. You may need this if you have not been vaccinated.  Zoster vaccine. You may need this after age 16.  Measles, mumps, and rubella (MMR) vaccine. You may need at least one dose of MMR if you were born in 1957 or later. You may also need a second dose.  Pneumococcal 13-valent conjugate (PCV13) vaccine. You may need this if you have certain conditions and were not previously vaccinated.  Pneumococcal polysaccharide (PPSV23) vaccine. You may need one or two doses if you smoke cigarettes or if you have certain conditions.  Meningococcal vaccine. You may need this if you have certain conditions.  Hepatitis A vaccine. You may need this if you have certain conditions or if you travel or work in places where you may be exposed to hepatitis A.  Hepatitis B vaccine. You may need this if you have certain conditions or if you travel or work in places where you may be exposed to hepatitis B.  Haemophilus influenzae type b (Hib) vaccine. You may need this if you have  certain conditions.  Talk to your health care provider about which screenings and vaccines you need and how often you need them. This information is not intended to replace advice given to you by your health care provider. Make sure you discuss any questions you have with your health care provider. Document Released: 06/03/2015 Document Revised: 01/25/2016 Document Reviewed: 03/08/2015 Elsevier Interactive Patient Education  2017 [place]. Austyn Seier M.D.

## 2017-04-17 ENCOUNTER — Ambulatory Visit (INDEPENDENT_AMBULATORY_CARE_PROVIDER_SITE_OTHER): Payer: BC Managed Care – PPO | Admitting: Internal Medicine

## 2017-04-17 ENCOUNTER — Encounter: Payer: Self-pay | Admitting: Internal Medicine

## 2017-04-17 VITALS — BP 120/70 | HR 70 | Temp 98.2°F | Ht 64.76 in | Wt 175.9 lb

## 2017-04-17 DIAGNOSIS — Z0000 Encounter for general adult medical examination without abnormal findings: Secondary | ICD-10-CM

## 2017-04-17 DIAGNOSIS — E785 Hyperlipidemia, unspecified: Secondary | ICD-10-CM

## 2017-04-17 NOTE — Patient Instructions (Addendum)
Get shingles  vaccine    Get fasting lipid panel.    Lab orders are in system.  Continue lifestyle intervention healthy eating and exercise .  Work on getting enough sleep .     Preventive Care 40-64 Years, Female Preventive care refers to lifestyle choices and visits with your health care provider that can promote health and wellness. What does preventive care include?  A yearly physical exam. This is also called an annual well check.  Dental exams once or twice a year.  Routine eye exams. Ask your health care provider how often you should have your eyes checked.  Personal lifestyle choices, including: ? Daily care of your teeth and gums. ? Regular physical activity. ? Eating a healthy diet. ? Avoiding tobacco and drug use. ? Limiting alcohol use. ? Practicing safe sex. ? Taking low-dose aspirin daily starting at age 18. ? Taking vitamin and mineral supplements as recommended by your health care provider. What happens during an annual well check? The services and screenings done by your health care provider during your annual well check will depend on your age, overall health, lifestyle risk factors, and family history of disease. Counseling Your health care provider may ask you questions about your:  Alcohol use.  Tobacco use.  Drug use.  Emotional well-being.  Home and relationship well-being.  Sexual activity.  Eating habits.  Work and work Statistician.  Method of birth control.  Menstrual cycle.  Pregnancy history.  Screening You may have the following tests or measurements:  Height, weight, and BMI.  Blood pressure.  Lipid and cholesterol levels. These may be checked every 5 years, or more frequently if you are over 37 years old.  Skin check.  Lung cancer screening. You may have this screening every year starting at age 81 if you have a 30-pack-year history of smoking and currently smoke or have quit within the past 15 years.  Fecal occult  blood test (FOBT) of the stool. You may have this test every year starting at age 33.  Flexible sigmoidoscopy or colonoscopy. You may have a sigmoidoscopy every 5 years or a colonoscopy every 10 years starting at age 38.  Hepatitis C blood test.  Hepatitis B blood test.  Sexually transmitted disease (STD) testing.  Diabetes screening. This is done by checking your blood sugar (glucose) after you have not eaten for a while (fasting). You may have this done every 1-3 years.  Mammogram. This may be done every 1-2 years. Talk to your health care provider about when you should start having regular mammograms. This may depend on whether you have a family history of breast cancer.  BRCA-related cancer screening. This may be done if you have a family history of breast, ovarian, tubal, or peritoneal cancers.  Pelvic exam and Pap test. This may be done every 3 years starting at age 65. Starting at age 54, this may be done every 5 years if you have a Pap test in combination with an HPV test.  Bone density scan. This is done to screen for osteoporosis. You may have this scan if you are at high risk for osteoporosis.  Discuss your test results, treatment options, and if necessary, the need for more tests with your health care provider. Vaccines Your health care provider may recommend certain vaccines, such as:  Influenza vaccine. This is recommended every year.  Tetanus, diphtheria, and acellular pertussis (Tdap, Td) vaccine. You may need a Td booster every 10 years.  Varicella vaccine. You may  need this if you have not been vaccinated.  Zoster vaccine. You may need this after age 55.  Measles, mumps, and rubella (MMR) vaccine. You may need at least one dose of MMR if you were born in 1957 or later. You may also need a second dose.  Pneumococcal 13-valent conjugate (PCV13) vaccine. You may need this if you have certain conditions and were not previously vaccinated.  Pneumococcal polysaccharide  (PPSV23) vaccine. You may need one or two doses if you smoke cigarettes or if you have certain conditions.  Meningococcal vaccine. You may need this if you have certain conditions.  Hepatitis A vaccine. You may need this if you have certain conditions or if you travel or work in places where you may be exposed to hepatitis A.  Hepatitis B vaccine. You may need this if you have certain conditions or if you travel or work in places where you may be exposed to hepatitis B.  Haemophilus influenzae type b (Hib) vaccine. You may need this if you have certain conditions.  Talk to your health care provider about which screenings and vaccines you need and how often you need them. This information is not intended to replace advice given to you by your health care provider. Make sure you discuss any questions you have with your health care provider. Document Released: 06/03/2015 Document Revised: 01/25/2016 Document Reviewed: 03/08/2015 Elsevier Interactive Patient Education  2017 [employer].

## 2017-04-29 ENCOUNTER — Other Ambulatory Visit: Payer: BC Managed Care – PPO

## 2017-05-06 ENCOUNTER — Other Ambulatory Visit (INDEPENDENT_AMBULATORY_CARE_PROVIDER_SITE_OTHER): Payer: BC Managed Care – PPO

## 2017-05-06 DIAGNOSIS — E785 Hyperlipidemia, unspecified: Secondary | ICD-10-CM

## 2017-05-06 LAB — LIPID PANEL
Cholesterol: 196 mg/dL (ref 0–200)
HDL: 70.6 mg/dL (ref >39.00)
LDL Cholesterol: 108 mg/dL — ABNORMAL HIGH (ref 0–99)
NonHDL: 125.37
Total CHOL/HDL Ratio: 3
Triglycerides: 86 mg/dL (ref 0.0–149.0)
VLDL: 17.2 mg/dL (ref 0.0–40.0)

## 2017-06-20 ENCOUNTER — Ambulatory Visit: Payer: BC Managed Care – PPO | Admitting: Family Medicine

## 2017-06-20 ENCOUNTER — Encounter: Payer: Self-pay | Admitting: Family Medicine

## 2017-06-20 VITALS — BP 102/64 | HR 97 | Temp 98.4°F | Ht 64.76 in | Wt 172.8 lb

## 2017-06-20 DIAGNOSIS — J069 Acute upper respiratory infection, unspecified: Secondary | ICD-10-CM | POA: Diagnosis not present

## 2017-06-20 NOTE — Patient Instructions (Signed)

## 2017-06-20 NOTE — Progress Notes (Signed)
HPI:  Acute visit for respiratory illness: -started: about 5 days ago -symptoms:nasal congestion, sore throat, cough, mild bilat max sinus discomfort -denies:fever, SOB, NVD, tooth pain, white thick sinus drainage, body achiness, sig worsening -has tried: nothing -sick contacts/travel/risks: no reported flu, strep or tick exposure -Hx of: denies hx immunocompromise, recurrent sinus issues  ROS: See pertinent positives and negatives per HPI.  Past Medical History:  Diagnosis Date  . Arthritis   . Depression    stable on medication  . Diverticulitis 2015  . Diverticulosis   . Gallstones   . GERD (gastroesophageal reflux disease)   . Hyperlipidemia    no meds needed  . IBS (irritable bowel syndrome)   . Osteopenia   . PUD (peptic ulcer disease) 1980's  . Renal cyst    4.5-6 cm stable single  seen by uro in past    Past Surgical History:  Procedure Laterality Date  . CHOLECYSTECTOMY  1987  . COLONOSCOPY    . IRRIGATION AND DEBRIDEMENT SEBACEOUS CYST Right 3/ 2014   chest wall  . UPPER GASTROINTESTINAL ENDOSCOPY    . WISDOM TOOTH EXTRACTION  age 89 & 47   lower teeth done first and upper wisdom teeth last    Family History  Problem Relation Age of Onset  . Rheum arthritis Father   . Pneumonia Father        deceased  . Colon cancer Father 81  . Heart attack Father   . Breast cancer Sister 78  . Breast cancer Paternal Grandmother 28  . Osteoarthritis Mother   . COPD Mother   . Pneumonia Mother   . Osteoporosis Mother   . Cancer Maternal Grandmother        ? GI  . Heart failure Maternal Grandfather   . Heart failure Paternal Grandfather   . Esophageal cancer Neg Hx   . Rectal cancer Neg Hx   . Stomach cancer Neg Hx     Social History   Socioeconomic History  . Marital status: Divorced    Spouse name: None  . Number of children: 0  . Years of education: None  . Highest education level: None  Social Needs  . Financial resource strain: None  . Food  insecurity - worry: None  . Food insecurity - inability: None  . Transportation needs - medical: None  . Transportation needs - non-medical: None  Occupational History  . Occupation: Product manager: Teaching laboratory technician S[employer]  Tobacco Use  . Smoking status: Never Smoker  . Smokeless tobacco: Never Used  Substance and Sexual Activity  . Alcohol use: Yes    Comment: occasionally  . Drug use: No  . Sexual activity: No    Birth control/protection: Post-menopausal  Other Topics Concern  . None  Social History Narrative   Copy.   Non smoker    Hh of 1 ... 2 cats    Single   G0P0   Mom is now in assisted living and she has moved into her old residence     Current Outpatient Medications:  .  Cholecalciferol (VITAMIN D PO), Take 2,000 mg by mouth every other day. , Disp: , Rfl:  .  FLUoxetine (PROZAC) 40 MG capsule, Take 40 mg by mouth daily. , Disp: , Rfl:  .  Multiple Vitamins-Minerals (CENTRUM ULTRA WOMENS PO), Take 1 tablet by mouth every other day. , Disp: , Rfl:  .  omeprazole (PRILOSEC) 20 MG capsule, Take 1  capsule (20 mg total) by mouth daily., Disp: 30 capsule, Rfl: 11 .  Probiotic Product (PROBIOTIC PO), Take by mouth daily., Disp: , Rfl:  .  propranolol (INDERAL) 10 MG tablet, Take 10 mg by mouth as needed. , Disp: , Rfl:   EXAM:  Vitals:   06/20/17 1527  BP: 102/64  Pulse: 97  Temp: 98.4 F (36.9 C)    Body mass index is 28.97 kg/m.  GENERAL: vitals reviewed and listed above, alert, oriented, appears well hydrated and in no acute distress  HEENT: atraumatic, conjunttiva clear, no obvious abnormalities on inspection of external nose and ears, normal appearance of ear canals and TMs, clear nasal congestion, mild post oropharyngeal erythema with PND, no tonsillar edema or exudate, no sinus TTP  NECK: no obvious masses on inspection  LUNGS: clear to auscultation bilaterally, no wheezes, rales or rhonchi, good  air movement  CV: HRRR, no peripheral edema  MS: moves all extremities without noticeable abnormality  PSYCH: pleasant and cooperative, no obvious depression or anxiety  ASSESSMENT AND PLAN:  Discussed the following assessment and plan:  Viral upper respiratory illness  -given HPI and exam findings today, a serious infection or illness is unlikely. We discussed potential etiologies, with VURI being most likely, and advised supportive care and monitoring. We discussed treatment side effects, likely course, antibiotic misuse, transmission, and signs of developing a serious illness. -of course, we advised to return or notify a doctor immediately if symptoms worsen or persist or new concerns arise.    Patient Instructions  INSTRUCTIONS FOR UPPER RESPIRATORY INFECTION:  -plenty of rest and fluids  -nasal saline wash 2-3 times daily (use prepackaged nasal saline or bottled/distilled water if making your own)   -can use AFRIN nasal spray for drainage and nasal congestion - but do NOT use longer then 3-4 days  -can use tylenol (in no history of liver disease) or ibuprofen (if no history of kidney disease, bowel bleeding or significant heart disease) as directed for aches and sorethroat  -in the winter time, using a humidifier at night is helpful (please follow cleaning instructions)  -if you are taking a cough medication - use only as directed, may also try a teaspoon of honey to coat the throat and throat lozenges. If given a cough medication with codeine or hydrocodone or other narcotic please be advised that this contains a strong and  potentially addicting medication. Please follow instructions carefully, take as little as possible and only use AS NEEDED for severe cough. Discuss potential side effects with your pharmacy. Please do not drive or operate machinery while taking these types of medications. Please do not take other sedating medications, drugs or alcohol while taking this  medication without discussing with your doctor.  -for sore throat, salt water gargles can help  -follow up if you have fevers, facial pain, tooth pain, difficulty breathing or are worsening or symptoms persist longer then expected  Upper Respiratory Infection, Adult An upper respiratory infection (URI) is also known as the common cold. It is often caused by a type of germ (virus). Colds are easily spread (contagious). You can pass it to others by kissing, coughing, sneezing, or drinking out of the same glass. Usually, you get better in 1 to 3  weeks.  However, the cough can last for even longer. HOME CARE   Only take medicine as told by your doctor. Follow instructions provided above.  Drink enough water and fluids to keep your pee (urine) clear or pale yellow.  Get plenty of rest.  Return to work when your temperature is < 100 for 24 hours or as told by your doctor. You may use a face mask and wash your hands to stop your cold from spreading. GET HELP RIGHT AWAY IF:   After the first few days, you feel you are getting worse.  You have questions about your medicine.  You have chills, shortness of breath, or red spit (mucus).  You have pain in the face for more then 1-2 days, especially when you bend forward.  You have a fever, puffy (swollen) neck, pain when you swallow, or white spots in the back of your throat.  You have a bad headache, ear pain, sinus pain, or chest pain.  You have a high-pitched whistling sound when you breathe in and out (wheezing).  You cough up blood.  You have sore muscles or a stiff neck. MAKE SURE YOU:   Understand these instructions.  Will watch your condition.  Will get help right away if you are not doing well or get worse. Document Released: 10/24/2007 Document Revised: 07/30/2011 Document Reviewed: 08/12/2013 [facility] Patient Information 2015 [place], [place]. This information is not intended to replace advice given to you by your health care  provider. Make sure you discuss any questions you have with your health care provider.    Lucretia Kern, DO

## 2017-06-28 ENCOUNTER — Emergency Department
Admission: EM | Admit: 2017-06-28 | Discharge: 2017-06-28 | Disposition: A | Discharge status: Home / Self Care | Payer: BC Managed Care – PPO | Source: Home / Self Care | Attending: Family Medicine | Admitting: Family Medicine

## 2017-06-28 DIAGNOSIS — J209 Acute bronchitis, unspecified: Secondary | ICD-10-CM | POA: Diagnosis not present

## 2017-06-28 MED ORDER — BENZONATATE 100 MG PO CAPS: 100.0000 mg | ORAL_CAPSULE | Freq: Three times a day (TID) | ORAL | 0 refills | Status: DC

## 2017-06-28 MED ORDER — PREDNISONE 20 MG PO TABS: ORAL_TABLET | ORAL | 0 refills | Status: DC

## 2017-06-28 MED ORDER — AZITHROMYCIN 250 MG PO TABS: 250.0000 mg | ORAL_TABLET | Freq: Every day | ORAL | 0 refills | Status: DC

## 2017-06-28 MED ORDER — ALBUTEROL SULFATE HFA 108 (90 BASE) MCG/ACT IN AERS: 1.0000 | INHALATION_SPRAY | Freq: Four times a day (QID) | RESPIRATORY_TRACT | 0 refills | Status: DC | PRN

## 2017-06-28 NOTE — ED Provider Notes (Signed)
[name]    CSN: [id number] Arrival date & time: 06/28/17  1618     History   Chief Complaint Chief Complaint  Patient presents with  . Cough  . Shortness of Breath    HPI [name] [name] is a 62 y.o. female.   HPI [name] [name] is a 61 y.o. female presenting to UC with c/o worsening productive cough with nasal congestion, chest tightness, and sore abdominal muscles from coughing.  She was initially seen by her PCP on 06/20/17 after having URI symptoms for 5 days at that time.  At that time, sinus congestion and pressure was most bothersome for her.  She was advised it was viral and was not given any prescriptions.  She denies fever, chills, n/v/d. 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  No hx of asthma or COPD.    Past Medical History:  Diagnosis Date  . Arthritis   . Depression    stable on medication  . Diverticulitis 2015  . Diverticulosis   . Gallstones   . GERD (gastroesophageal reflux disease)   . Hyperlipidemia    no meds needed  . IBS (irritable bowel syndrome)   . Osteopenia   . PUD (peptic ulcer disease) 1980's  . Renal cyst    4.5-6 cm stable single  seen by uro in past    Patient Active Problem List   Diagnosis Date Noted  . Elevated alkaline phosphatase measurement 09/26/2014  . Renal cyst, left 12/31/2013  . Osteopenia 12/31/2013  . Unspecified vitamin D deficiency 12/31/2013  . Postmenopausal atrophic vaginitis 12/31/2013  . Family history of colon cancer 10/02/2013  . Diverticulitis of colon without hemorrhage 09/28/2013  . Wrist pain, right 05/13/2013  . Hx of fall 05/13/2013  . Hypercholesteremia 05/13/2013  . History of kidney stones   . Thumb pain 02/21/2013  . Musculoskeletal leg pain 02/02/2013  . Gait abnormality 02/02/2013  . Chest discomfort 05/28/2011  . Left wrist injury 12/01/2010  . Renal cyst 12/01/2010  . Cough 09/27/2010  . Neck pain 08/29/2010  . Tingling sensation 08/29/2010  .  Allergic rhinitis, seasonal 08/29/2010  . LEG PAIN, RIGHT 07/01/2009  . VARICOSE VEINS LOWER EXTREMITIES W/OTH COMPS 12/06/2008  . BACK PAIN, THORACIC REGION 12/06/2008  . BACK PAIN 11/10/2008  . BRACHIAL NEURITIS OR RADICULITIS NOS 10/29/2008  . NAUSEA 10/29/2008  . OTALGIA 06/21/2008  . URINARY URGENCY 06/21/2008  . Irritable bowel syndrome 04/23/2007  . HYPERLIPIDEMIA NEC/NOS 01/10/2007  . DEPRESSION 01/08/2007  . GERD 01/08/2007  . Diverticulosis of colon (without mention of hemorrhage) 07/28/2002    Past Surgical History:  Procedure Laterality Date  . CHOLECYSTECTOMY  1987  . COLONOSCOPY    . IRRIGATION AND DEBRIDEMENT SEBACEOUS CYST Right 3/ 2014   chest wall  . UPPER GASTROINTESTINAL ENDOSCOPY    . WISDOM TOOTH EXTRACTION  age 70 & 20   lower teeth done first and upper wisdom teeth last    OB History    Gravida Para Term Preterm AB Living   0 0 0 0 0 0   SAB TAB Ectopic Multiple Live Births   0 0 0 0 0       Home Medications    Prior to Admission medications   Medication Sig Start Date End Date Taking? Authorizing Provider  Cholecalciferol (VITAMIN D PO) Take 2,000 mg by mouth every other day.    Yes [provider]  FLUoxetine (PROZAC) 20 MG tablet Take 20 mg  by mouth daily.   Yes [provider]  Multiple Vitamins-Minerals (CENTRUM ULTRA WOMENS PO) Take 1 tablet by mouth every other day.    Yes [provider]  omeprazole (PRILOSEC) 20 MG capsule Take 1 capsule (20 mg total) by mouth daily. 12/03/16  Yes Danis, Kirke Corin, MD  Probiotic Product (PROBIOTIC PO) Take by mouth daily.   Yes [provider]  albuterol (PROVENTIL HFA;VENTOLIN HFA) 108 (90 Base) MCG/ACT inhaler Inhale 1-2 puffs into the lungs every 6 (six) hours as needed for wheezing or shortness of breath. 06/28/17   Noe Gens, PA-C  azithromycin (ZITHROMAX) 250 MG tablet Take 1 tablet (250 mg total) by mouth daily. Take first 2 tablets together, then 1 every day  until finished. 06/28/17   Noe Gens, PA-C  benzonatate (TESSALON) 100 MG capsule Take 1-2 capsules (100-200 mg total) by mouth every 8 (eight) hours. 06/28/17   Noe Gens, PA-C  FLUoxetine (PROZAC) 40 MG capsule Take 40 mg by mouth daily.  02/04/14   [provider]  predniSONE (DELTASONE) 20 MG tablet 3 tabs po day one, then 2 po daily x 4 days 06/28/17   Noe Gens, PA-C  propranolol (INDERAL) 10 MG tablet Take 10 mg by mouth as needed.  04/13/13   [provider]    Family History Family History  Problem Relation Age of Onset  . Rheum arthritis Father   . Pneumonia Father        deceased  . Colon cancer Father 82  . Heart attack Father   . Breast cancer Sister 65  . Breast cancer Paternal Grandmother 38  . Osteoarthritis Mother   . COPD Mother   . Pneumonia Mother   . Osteoporosis Mother   . Cancer Maternal Grandmother        ? GI  . Heart failure Maternal Grandfather   . Heart failure Paternal Grandfather   . Esophageal cancer Neg Hx   . Rectal cancer Neg Hx   . Stomach cancer Neg Hx     Social History Social History   Tobacco Use  . Smoking status: Never Smoker  . Smokeless tobacco: Never Used  Substance Use Topics  . Alcohol use: No    Frequency: Never    Comment: occasionally  . Drug use: No     Allergies   Patient has no known allergies.   Review of Systems Review of Systems  Constitutional: Negative for chills and fever.  HENT: Positive for congestion and sore throat. Negative for ear pain, trouble swallowing and voice change.   Respiratory: Positive for cough and chest tightness. Negative for shortness of breath.   Cardiovascular: Negative for chest pain and palpitations.  Gastrointestinal: Negative for abdominal pain, diarrhea, nausea and vomiting.  Musculoskeletal: Negative for arthralgias, back pain and myalgias.  Skin: Negative for rash.  Neurological: Positive for headaches. Negative for dizziness and light-headedness.       Physical Exam Triage Vital Signs ED Triage Vitals  Enc Vitals Group     BP 06/28/17 1642 122/69     Pulse Rate 06/28/17 1642 81     Resp --      Temp 06/28/17 1642 98 F (36.7 C)     Temp Source 06/28/17 1642 Oral     SpO2 06/28/17 1642 94 %     Weight 06/28/17 1643 126 lb 8 oz (57.4 kg)     Height 06/28/17 1643 5\' 5"  (1.651 m)     Head  Circumference --      Peak Flow --      Pain Score --      Pain Loc --      Pain Edu? --      Excl. in [place]? --    No data found.  Updated Vital Signs BP 122/69 (BP Location: Right Arm)   Pulse 81   Temp 98 F (36.7 C) (Oral)   Ht 5\' 5"  (1.651 m)   Wt 126 lb 8 oz (57.4 kg)   LMP 11/18/2004   SpO2 94%   BMI 21.05 kg/m   Visual Acuity Right Eye Distance:   Left Eye Distance:   Bilateral Distance:    Right Eye Near:   Left Eye Near:    Bilateral Near:     Physical Exam  Constitutional: She is oriented to person, place, and time. She appears well-developed and well-nourished.  Non-toxic appearance. She does not appear ill. No distress.  HENT:  Head: Normocephalic and atraumatic.  Right Ear: Tympanic membrane normal.  Left Ear: Tympanic membrane normal.  Nose: Nose normal. Right sinus exhibits no maxillary sinus tenderness and no frontal sinus tenderness. Left sinus exhibits no maxillary sinus tenderness and no frontal sinus tenderness.  Mouth/Throat: Uvula is midline and mucous membranes are normal. Posterior oropharyngeal erythema present. No oropharyngeal exudate, posterior oropharyngeal edema or tonsillar abscesses.  Eyes: EOM are normal.  Neck: Normal range of motion. Neck supple.  Cardiovascular: Normal rate and regular rhythm.  Pulmonary/Chest: Effort normal. No accessory muscle usage. No respiratory distress. She has wheezes. She has rhonchi. She has rales.  Diffuse wheeze and coarse breath sounds. Intermittent productive cough during exam. Able to speak in full sentences between coughing.  Musculoskeletal: Normal range  of motion.  Neurological: She is alert and oriented to person, place, and time.  Skin: Skin is warm and dry.  Psychiatric: She has a normal mood and affect. Her behavior is normal.  Nursing note and vitals reviewed.    UC Treatments / Results  Labs (all labs ordered are listed, but only abnormal results are displayed) Labs Reviewed - No data to display  EKG  EKG Interpretation None       Radiology No results found.  Procedures Procedures (including critical care time)  Medications Ordered in UC Medications - No data to display   Initial Impression / Assessment and Plan / UC Course  I have reviewed the triage vital signs and the nursing notes.  Pertinent labs & imaging results that were available during my care of the patient were reviewed by me and considered in my medical decision making (see chart for details).     Hx and exam c/w acute bronchitis Will cover for underlying bacterial cause given duration of worsening symptoms. F/u with PCP in 1 week if not improving, sooner if significantly worsening.   Final Clinical Impressions(s) / UC Diagnoses   Final diagnoses:  Acute bronchitis, unspecified organism    ED Discharge Orders        Ordered    azithromycin (ZITHROMAX) 250 MG tablet  Daily     06/28/17 1651    predniSONE (DELTASONE) 20 MG tablet     06/28/17 1651    albuterol (PROVENTIL HFA;VENTOLIN HFA) 108 (90 Base) MCG/ACT inhaler  Every 6 hours PRN     06/28/17 1651    benzonatate (TESSALON) 100 MG capsule  Every 8 hours     06/28/17 1651       Controlled Substance Prescriptions [place] Controlled Substance  Registry consulted? Not Applicable   Tyrell Antonio 06/28/17 0569

## 2017-06-28 NOTE — ED Triage Notes (Signed)
Pt c/o cough, sinus pressure, diarrhea, nausea and chills greater than 8 days.  Pt states she saw her PCP 8 days ago and was told she has a "cold" but was not treated with any RXs. Pt states she has been using OTC Mucinex Wallene Dales, CMA

## 2017-06-28 NOTE — Discharge Instructions (Signed)
°  You may take 500mg acetaminophen every 4-6 hours or in combination with ibuprofen 400-600mg every 6-8 hours as needed for pain, inflammation, and fever. ° °Be sure to drink at least eight 8oz glasses of water to stay well hydrated and get at least 8 hours of sleep at night, preferably more while sick.  ° °Please take antibiotics as prescribed and be sure to complete entire course even if you start to feel better to ensure infection does not come back. ° °

## 2017-07-08 ENCOUNTER — Encounter: Payer: Self-pay | Admitting: Internal Medicine

## 2017-07-08 ENCOUNTER — Ambulatory Visit: Payer: BC Managed Care – PPO | Admitting: Internal Medicine

## 2017-07-08 ENCOUNTER — Ambulatory Visit (INDEPENDENT_AMBULATORY_CARE_PROVIDER_SITE_OTHER)
Admission: RE | Admit: 2017-07-08 | Discharge: 2017-07-08 | Disposition: A | Discharge status: Home / Self Care | Payer: BC Managed Care – PPO | Source: Ambulatory Visit | Attending: Internal Medicine | Admitting: Internal Medicine

## 2017-07-08 ENCOUNTER — Other Ambulatory Visit: Payer: Self-pay | Admitting: Internal Medicine

## 2017-07-08 VITALS — BP 124/72 | HR 85 | Temp 98.5°F | Wt 172.8 lb

## 2017-07-08 DIAGNOSIS — R05 Cough: Secondary | ICD-10-CM

## 2017-07-08 DIAGNOSIS — R053 Chronic cough: Secondary | ICD-10-CM

## 2017-07-08 DIAGNOSIS — M546 Pain in thoracic spine: Secondary | ICD-10-CM

## 2017-07-08 DIAGNOSIS — J40 Bronchitis, not specified as acute or chronic: Secondary | ICD-10-CM

## 2017-07-08 MED ORDER — FLUTICASONE FUROATE-VILANTEROL 100-25 MCG/INH IN AEPB: 1.0000 | INHALATION_SPRAY | Freq: Every day | RESPIRATORY_TRACT | 1 refills | Status: DC

## 2017-07-08 NOTE — Progress Notes (Signed)
Chief Complaint  Patient presents with  . Bronchitis    Pt c/o cough, chest congestion, wheezing and back pain. Started with head congestion x 2 weeks - given Prednsione and Azithromycin by UC, completed course, reports little relief.     HPI: [name] [name] 62 y.o.  sda  But actually seen UC  2 8   For   Cough as sob dx acute bronchitis and  Given z pack and prednisone  2819  No x ray   Last antibiotic about 4-5 days ago .  Not a lot better but not worse cept some left paraspinal thorax pain  No pleurisy    Prev uri 1 31 dr Maudie Mercury  Viral   Very tired      From illness  No hx of asthma .    No meds now x inhaler not sure helpful .  Sinu congestion and pressure at  Some point but not now .   Hears wheezy at night  ? Mild sob ? Hard to get up mucous  Was on mucinex dm and? No help ? Tired se?    hx abn cxray 2012?  pfts nl  ROS: See pertinent positives and negatives per HPI.  Past Medical History:  Diagnosis Date  . Arthritis   . Depression    stable on medication  . Diverticulitis 2015  . Diverticulosis   . Gallstones   . GERD (gastroesophageal reflux disease)   . Hyperlipidemia    no meds needed  . IBS (irritable bowel syndrome)   . Osteopenia   . PUD (peptic ulcer disease) 1980's  . Renal cyst    4.5-6 cm stable single  seen by uro in past    Family History  Problem Relation Age of Onset  . Rheum arthritis Father   . Pneumonia Father        deceased  . Colon cancer Father 39  . Heart attack Father   . Breast cancer Sister 29  . Breast cancer Paternal Grandmother 56  . Osteoarthritis Mother   . COPD Mother   . Pneumonia Mother   . Osteoporosis Mother   . Cancer Maternal Grandmother        ? GI  . Heart failure Maternal Grandfather   . Heart failure Paternal Grandfather   . Esophageal cancer Neg Hx   . Rectal cancer Neg Hx   . Stomach cancer Neg Hx     Social History   Socioeconomic History  . Marital status: Divorced    Spouse name: None  . Number of  children: 0  . Years of education: None  . Highest education level: None  Social Needs  . Financial resource strain: None  . Food insecurity - worry: None  . Food insecurity - inability: None  . Transportation needs - medical: None  . Transportation needs - non-medical: None  Occupational History  . Occupation: Product manager: Teaching laboratory technician [employer]  Tobacco Use  . Smoking status: Never Smoker  . Smokeless tobacco: Never Used  Substance and Sexual Activity  . Alcohol use: No    Frequency: Never    Comment: occasionally  . Drug use: No  . Sexual activity: No    Birth control/protection: Post-menopausal  Other Topics Concern  . None  Social History Narrative   Copy.   Non smoker    Hh of 1 ... 2 cats    Single   G0P0   Mom  is now in assisted living and she has moved into her old residence    Outpatient Medications Prior to Visit  Medication Sig Dispense Refill  . albuterol (PROVENTIL HFA;VENTOLIN HFA) 108 (90 Base) MCG/ACT inhaler Inhale 1-2 puffs into the lungs every 6 (six) hours as needed for wheezing or shortness of breath. 1 Inhaler 0  . benzonatate (TESSALON) 100 MG capsule Take 1-2 capsules (100-200 mg total) by mouth every 8 (eight) hours. 21 capsule 0  . Cholecalciferol (VITAMIN D PO) Take 2,000 mg by mouth every other day.     Marland Kitchen FLUoxetine (PROZAC) 20 MG tablet Take 20 mg by mouth daily.    Marland Kitchen FLUoxetine (PROZAC) 40 MG capsule Take 40 mg by mouth daily.     . Multiple Vitamins-Minerals (CENTRUM ULTRA WOMENS PO) Take 1 tablet by mouth every other day.     Marland Kitchen omeprazole (PRILOSEC) 20 MG capsule Take 1 capsule (20 mg total) by mouth daily. 30 capsule 11  . Probiotic Product (PROBIOTIC PO) Take by mouth daily.    . propranolol (INDERAL) 10 MG tablet Take 10 mg by mouth as needed.     Marland Kitchen azithromycin (ZITHROMAX) 250 MG tablet Take 1 tablet (250 mg total) by mouth daily. Take first 2 tablets together, then 1 every day  until finished. (Patient not taking: Reported on 07/08/2017) 6 tablet 0  . predniSONE (DELTASONE) 20 MG tablet 3 tabs po day one, then 2 po daily x 4 days (Patient not taking: Reported on 07/08/2017) 11 tablet 0   No facility-administered medications prior to visit.      EXAM:  BP 124/72 (BP Location: Right Arm, Patient Position: Sitting, Cuff Size: Normal)   Pulse 85   Temp 98.5 F (36.9 C) (Oral)   Wt 172 lb 12.8 oz (78.4 kg)   LMP 11/18/2004   SpO2 96%   BMI 28.76 kg/m   Body mass index is 28.76 kg/m. WDWN in NAD  quiet respirations; mildly congested  somewhat hoarse. Non toxic .  Tired appearing HEENT: Normocephalic ;atraumatic , Eyes;  PERRL, EOMs  Full, lids and conjunctiva clear,,Ears: no deformities, canals nl, TM landmarks normal, Nose: no deformity or discharge but congested;face minimally tender Mouth : OP clear without lesion or edema . Neck: Supple without adenopathy or masses or bruits Chest:   Left more than right large airway sounds clear with cough and musical sx left base  Dec with inspiration  CV:  S1-S2 no gallops or murmurs peripheral perfusion is normal Skin :nl perfusion and no acute rashes  PSYCH: pleasant and cooperative, no obvious depression or anxiety  ASSESSMENT AND PLAN:  Discussed the following assessment and plan:  Cough, persistent - Plan: DG Chest 2 View  Wheezy bronchitis - Plan: DG Chest 2 View  Acute thoracic back pain, unspecified back pain laterality - Plan: DG Chest 2 View  bronchitis sx  Poss viral  Sp rx with pred and azithro  but   On going and feeling quite tired .  Will get x ray and plan after that.  X ray cw scarring and  emphysema ?   Add breo  And fu 1 week if not getting better  -Patient advised to return or notify health care team  if symptoms worsen ,persist or new concerns arise.  Patient Instructions    Get chest x ray  Sometimes aftr a respiratory infection peoiple get  Reactive irritable wheezing  That takesa a while  to resolve  Can use    Either  way take mucinex plain   Twice a day to move the mucous.  If you are getting pain  In face chest and worse contact us  In the interim .       Standley Brooking. Kamber Vignola M.D.

## 2017-07-08 NOTE — Patient Instructions (Addendum)
   Get chest x ray  Sometimes aftr a respiratory infection peoiple get  Reactive irritable wheezing  That takesa a while to resolve  Can use    Either  way take mucinex plain   Twice a day to move the mucous.  If you are getting pain  In face chest and worse contact us  In the interim .

## 2017-10-21 NOTE — Progress Notes (Deleted)
62 y.o. G0P0000 DivorcedCaucasianF here for annual exam.      Patient's last menstrual period was 11/18/2004.          Sexually active: {yes no:314532}  The current method of family planning is post menopausal status.    Exercising: {yes no:314532}  {types:19826} Smoker:  no  Health Maintenance: Pap: 01-04-15 Neg:neg HR HPV, 08/2011 Neg:Neg HR HPV History of abnormal Pap:  {YES NO:22349} MMG: 03-07-17 Density C/Neg/BiRads1 Colonoscopy:  ***?02/27/13, Diverticulitis - Repeat 5 years  BMD: 03-07-17 Osteopenia TDaP: 05-13-13 Gardasil: n/a   reports that she has never smoked. She has never used smokeless tobacco. She reports that she does not drink alcohol or use drugs.  Past Medical History:  Diagnosis Date  . Arthritis   . Depression    stable on medication  . Diverticulitis 2015  . Diverticulosis   . Gallstones   . GERD (gastroesophageal reflux disease)   . Hyperlipidemia    no meds needed  . IBS (irritable bowel syndrome)   . Osteopenia   . PUD (peptic ulcer disease) 1980's  . Renal cyst    4.5-6 cm stable single  seen by uro in past    Past Surgical History:  Procedure Laterality Date  . CHOLECYSTECTOMY  1987  . COLONOSCOPY    . IRRIGATION AND DEBRIDEMENT SEBACEOUS CYST Right 3/ 2014   chest wall  . UPPER GASTROINTESTINAL ENDOSCOPY    . WISDOM TOOTH EXTRACTION  age 12 & 41   lower teeth done first and upper wisdom teeth last    Current Outpatient Medications  Medication Sig Dispense Refill  . albuterol (PROVENTIL HFA;VENTOLIN HFA) 108 (90 Base) MCG/ACT inhaler Inhale 1-2 puffs into the lungs every 6 (six) hours as needed for wheezing or shortness of breath. 1 Inhaler 0  . benzonatate (TESSALON) 100 MG capsule Take 1-2 capsules (100-200 mg total) by mouth every 8 (eight) hours. 21 capsule 0  . Cholecalciferol (VITAMIN D PO) Take 2,000 mg by mouth every other day.     Marland Kitchen FLUoxetine (PROZAC) 20 MG tablet Take 20 mg by mouth daily.    Marland Kitchen FLUoxetine (PROZAC) 40 MG  capsule Take 40 mg by mouth daily.     . fluticasone furoate-vilanterol (BREO ELLIPTA) 100-25 MCG/INH AEPB Inhale 1 puff into the lungs daily. 60 each 1  . Multiple Vitamins-Minerals (CENTRUM ULTRA WOMENS PO) Take 1 tablet by mouth every other day.     Marland Kitchen omeprazole (PRILOSEC) 20 MG capsule Take 1 capsule (20 mg total) by mouth daily. 30 capsule 11  . Probiotic Product (PROBIOTIC PO) Take by mouth daily.    . propranolol (INDERAL) 10 MG tablet Take 10 mg by mouth as needed.      No current facility-administered medications for this visit.     Family History  Problem Relation Age of Onset  . Rheum arthritis Father   . Pneumonia Father        deceased  . Colon cancer Father 46  . Heart attack Father   . Breast cancer Sister 31  . Breast cancer Paternal Grandmother 61  . Osteoarthritis Mother   . COPD Mother   . Pneumonia Mother   . Osteoporosis Mother   . Cancer Maternal Grandmother        ? GI  . Heart failure Maternal Grandfather   . Heart failure Paternal Grandfather   . Esophageal cancer Neg Hx   . Rectal cancer Neg Hx   . Stomach cancer Neg Hx     Review  of Systems  Exam:   LMP 11/18/2004   Weight change: @WEIGHTCHANGE @ Height:      Ht Readings from Last 3 Encounters:  06/28/17 5\' 5"  (1.651 m)  06/20/17 5' 4.76" (1.645 m)  04/17/17 5' 4.76" (1.645 m)    General appearance: alert, cooperative and appears stated age Head: Normocephalic, without obvious abnormality, atraumatic Neck: no adenopathy, supple, symmetrical, trachea midline and thyroid {CHL AMB PHY EX THYROID NORM DEFAULT:[phone]::"normal to inspection and palpation"} Lungs: clear to auscultation bilaterally Cardiovascular: regular rate and rhythm Breasts: {Exam; breast:13139::"normal appearance, no masses or tenderness"} Abdomen: soft, non-tender; non distended,  no masses,  no organomegaly Extremities: extremities normal, atraumatic, no cyanosis or edema Skin: Skin color, texture, turgor normal. No  rashes or lesions Lymph nodes: Cervical, supraclavicular, and axillary nodes normal. No abnormal inguinal nodes palpated Neurologic: Grossly normal   Pelvic: External genitalia:  no lesions              Urethra:  normal appearing urethra with no masses, tenderness or lesions              Bartholins and Skenes: normal                 Vagina: normal appearing vagina with normal color and discharge, no lesions              Cervix: {CHL AMB PHY EX CERVIX NORM DEFAULT:[phone]::"no lesions"}               Bimanual Exam:  Uterus:  {CHL AMB PHY EX UTERUS NORM [phone]::"normal size, contour, position, consistency, mobility, non-tender"}              Adnexa: {CHL AMB PHY EX ADNEXA NO MASS DEFAULT:[phone]::"no mass, fullness, tenderness"}               Rectovaginal: Confirms               Anus:  normal sphincter tone, no lesions  Chaperone was present for exam.  A:  Well Woman with normal exam  P:

## 2017-10-24 ENCOUNTER — Ambulatory Visit: Payer: BC Managed Care – PPO | Admitting: Obstetrics and Gynecology

## 2017-11-08 ENCOUNTER — Ambulatory Visit: Payer: BC Managed Care – PPO | Admitting: Obstetrics and Gynecology

## 2017-11-12 ENCOUNTER — Ambulatory Visit (INDEPENDENT_AMBULATORY_CARE_PROVIDER_SITE_OTHER)
Admission: RE | Admit: 2017-11-12 | Discharge: 2017-11-12 | Disposition: A | Discharge status: Home / Self Care | Payer: BC Managed Care – PPO | Source: Ambulatory Visit | Attending: Family Medicine | Admitting: Family Medicine

## 2017-11-12 ENCOUNTER — Other Ambulatory Visit: Payer: BC Managed Care – PPO

## 2017-11-12 ENCOUNTER — Ambulatory Visit: Payer: BC Managed Care – PPO | Admitting: Family Medicine

## 2017-11-12 ENCOUNTER — Encounter: Payer: Self-pay | Admitting: Family Medicine

## 2017-11-12 VITALS — BP 104/70 | HR 73 | Temp 98.3°F | Resp 12 | Ht 64.76 in | Wt 174.0 lb

## 2017-11-12 DIAGNOSIS — K219 Gastro-esophageal reflux disease without esophagitis: Secondary | ICD-10-CM | POA: Diagnosis not present

## 2017-11-12 DIAGNOSIS — J989 Respiratory disorder, unspecified: Secondary | ICD-10-CM

## 2017-11-12 DIAGNOSIS — R0989 Other specified symptoms and signs involving the circulatory and respiratory systems: Secondary | ICD-10-CM

## 2017-11-12 DIAGNOSIS — R053 Chronic cough: Secondary | ICD-10-CM

## 2017-11-12 DIAGNOSIS — R05 Cough: Secondary | ICD-10-CM

## 2017-11-12 MED ORDER — PREDNISONE 20 MG PO TABS: 40.0000 mg | ORAL_TABLET | Freq: Every day | ORAL | 0 refills | Status: AC

## 2017-11-12 NOTE — Progress Notes (Signed)
ACUTE VISIT  HPI:  Chief Complaint  Patient presents with  . Cough    X3wks  . Shortness of Breath    [name] is a 62 y.o.female here today complaining of 3 weeks of respiratory symptoms.  Symptoms are exacerbated by bending forward, they seem to be worse in the morning and at night when she is in bed.  Warm temperatures also aggravates symptoms. Productive cough with "milky" thick sputum.  Shortness of breath with minimal activity and in bed at night. No history of asthma or COPD. No history of tobacco use but history of secondhand smoking. She has not noted wheezing.  In 06/2017 she was treated for acute bronchitis, she was having wheezing and cough. She used Breo inhaler for about 30 days as well as albuterol inhaler as needed.  CXR 06/2017:Stable emphysematous changes and apical scarring but no acute overlying pulmonary process.  Cough  This is a new problem. The current episode started 1 to 4 weeks ago. The problem has been unchanged. The problem occurs every few hours. The cough is productive of sputum. Associated symptoms include heartburn, postnasal drip, rhinorrhea and shortness of breath. Pertinent negatives include no ear congestion, ear pain, eye redness, fever, headaches, hemoptysis, myalgias, rash, sore throat, sweats, weight loss or wheezing. The symptoms are aggravated by lying down and exercise. Risk factors for lung disease include smoking/tobacco exposure. She has tried nothing for the symptoms. There is no history of asthma, COPD or environmental allergies.     No Hx of recent travel. No sick contact. No known insect bite.  Hx of allergies: Not known.  OTC medications for this problem: None  Symptoms otherwise stable.  History of GERD, currently she is on omeprazole 20 mg daily. She has intermittent heartburn, not as bad. Retrosternal achy chest pain after coughing spells and mainly when she is in bed. She denies exertional chest  pain. She has not had nausea or vomiting.    Review of Systems  Constitutional: Negative for activity change, appetite change, fatigue, fever and weight loss.  HENT: Positive for postnasal drip and rhinorrhea. Negative for congestion, ear pain, mouth sores, sinus pressure, sore throat, trouble swallowing and voice change.   Eyes: Negative for discharge, redness and itching.  Respiratory: Positive for cough and shortness of breath. Negative for hemoptysis and wheezing.   Cardiovascular: Negative for palpitations and leg swelling.  Gastrointestinal: Positive for heartburn. Negative for abdominal pain, diarrhea, nausea and vomiting.  Musculoskeletal: Negative for gait problem and myalgias.  Skin: Negative for rash.  Allergic/Immunologic: Negative for environmental allergies.  Neurological: Negative for weakness and headaches.  Hematological: Negative for adenopathy. Does not bruise/bleed easily.      Current Outpatient Medications on File Prior to Visit  Medication Sig Dispense Refill  . albuterol (PROVENTIL HFA;VENTOLIN HFA) 108 (90 Base) MCG/ACT inhaler Inhale 1-2 puffs into the lungs every 6 (six) hours as needed for wheezing or shortness of breath. 1 Inhaler 0  . Cholecalciferol (VITAMIN D PO) Take 2,000 mg by mouth every other day.     Marland Kitchen FLUoxetine (PROZAC) 20 MG tablet Take 20 mg by mouth daily.    . Multiple Vitamins-Minerals (CENTRUM ULTRA WOMENS PO) Take 1 tablet by mouth every other day.     Marland Kitchen omeprazole (PRILOSEC) 20 MG capsule Take 1 capsule (20 mg total) by mouth daily. 30 capsule 11  . Probiotic Product (PROBIOTIC PO) Take by mouth daily.    . propranolol (INDERAL) 10 MG  tablet Take 10 mg by mouth as needed.      No current facility-administered medications on file prior to visit.      Past Medical History:  Diagnosis Date  . Arthritis   . Depression    stable on medication  . Diverticulitis 2015  . Diverticulosis   . Gallstones   . GERD (gastroesophageal reflux  disease)   . Hyperlipidemia    no meds needed  . IBS (irritable bowel syndrome)   . Osteopenia   . PUD (peptic ulcer disease) 1980's  . Renal cyst    4.5-6 cm stable single  seen by uro in past   No Known Allergies  Social History   Socioeconomic History  . Marital status: Divorced    Spouse name: Not on file  . Number of children: 0  . Years of education: Not on file  . Highest education level: Not on file  Occupational History  . Occupation: Product manager: [employer]  . Financial resource strain: Not on file  . Food insecurity:    Worry: Not on file    Inability: Not on file  . Transportation needs:    Medical: Not on file    Non-medical: Not on file  Tobacco Use  . Smoking status: Never Smoker  . Smokeless tobacco: Never Used  Substance and Sexual Activity  . Alcohol use: No    Frequency: Never    Comment: occasionally  . Drug use: No  . Sexual activity: Never    Birth control/protection: Post-menopausal  Lifestyle  . Physical activity:    Days per week: Not on file    Minutes per session: Not on file  . Stress: Not on file  Relationships  . Social connections:    Talks on phone: Not on file    Gets together: Not on file    Attends religious service: Not on file    Active member of club or organization: Not on file    Attends meetings of clubs or organizations: Not on file    Relationship status: Not on file  Other Topics Concern  . Not on file  Social History Narrative   Teacher media specialist graduate degree Oncologist.   Non smoker    Hh of 1 ... 2 cats    Single   G0P0   Mom is now in assisted living and she has moved into her old residence    Vitals:   11/12/17 1012  BP: 104/70  Pulse: 73  Resp: 12  Temp: 98.3 F (36.8 C)  SpO2: 97%   Body mass index is 28.96 kg/m.    Physical Exam  Nursing note and vitals reviewed. Constitutional: She is oriented to person, place, and time. She appears  well-developed. She does not appear ill. No distress.  HENT:  Head: Normocephalic and atraumatic.  Nose: Rhinorrhea present. Right sinus exhibits no maxillary sinus tenderness and no frontal sinus tenderness. Left sinus exhibits no maxillary sinus tenderness and no frontal sinus tenderness.  Mouth/Throat: Oropharynx is clear and moist and mucous membranes are normal.  Eyes: Conjunctivae are normal.  Cardiovascular: Normal rate and regular rhythm.  No murmur heard. Respiratory: Effort normal. No stridor. No respiratory distress. She has wheezes. She has no rhonchi. She has no rales. She exhibits tenderness.  Prolonged expiration and occasionally wheezing. Productive cough during visit.  GI: She exhibits no mass. There is no hepatomegaly. There is no tenderness.  Musculoskeletal: She exhibits no  edema.  Tenderness upon palpation of chondrocostal joints,anterior chest, bilaterally.  Lymphadenopathy:    She has no cervical adenopathy.  Neurological: She is alert and oriented to person, place, and time. She has normal strength. Gait normal.  Skin: Skin is warm. No rash noted. No erythema.  Psychiatric: She has a normal mood and affect.  Well groomed, good eye contact.      ASSESSMENT AND PLAN:   Ms. Kaylanni was seen today for cough and shortness of breath.  Diagnoses and all orders for this visit:  Persistent cough  We discussed possible etiologies: COPD, residual from recent URI, allergies, and GERD among some. I do not think antibiotic is needed at this time. Because of persistent cough CXR was ordered. Further recommendation will be given according to imaging and CBC results. Follow-up in 2 weeks.  -     CBC with Differential/Platelet -     DG Chest 2 View; Future -     predniSONE (DELTASONE) 20 MG tablet; Take 2 tablets (40 mg total) by mouth daily with breakfast for 3 days.  Gastroesophageal reflux disease without esophagitis  This problem could be aggravating symptoms. I  recommend increasing dose of omeprazole from once daily to twice daily before meals. Follow-up with PCP in 2 weeks  Reactive airway disease that is not asthma  After discussion of some side effects, she agrees with short course of prednisone. Albuterol inh 2 puff every 6 hours for a week then as needed for wheezing or shortness of breath.  Instructed about warning signs. Follow-up with PCP in 2 weeks.  -     predniSONE (DELTASONE) 20 MG tablet; Take 2 tablets (40 mg total) by mouth daily with breakfast for 3 days.         Amos Micheals G. [place], MD  [facility]. [place] office.

## 2017-11-12 NOTE — Patient Instructions (Addendum)
  [place] I have seen you today for an acute visit.  A few things to remember from today's visit:   Persistent cough - Plan: CBC with Differential/Platelet, DG Chest 2 View, predniSONE (DELTASONE) 20 MG tablet  Gastroesophageal reflux disease without esophagitis  Reactive airway disease that is not asthma - Plan: predniSONE (DELTASONE) 20 MG tablet   Medications prescribed today are intended for short period of time and will not be refill upon request, a follow up appointment might be necessary to discuss continuation of of treatment if appropriate.  Increase omeprazole to 20 mg before breakfast and 20 mg before dinner.  Albuterol inh 2 puff every 6 hours for a week then as needed for wheezing or shortness of breath.   Today X ray was ordered.  This can be done at [facility] at [facility] between 8 am and 5 pm: [place]. [phone].    In general please monitor for signs of worsening symptoms and seek immediate medical attention if any concerning.   I hope you get better soon!

## 2017-11-13 ENCOUNTER — Ambulatory Visit: Payer: BC Managed Care – PPO | Admitting: Internal Medicine

## 2017-11-13 ENCOUNTER — Encounter: Payer: Self-pay | Admitting: Family Medicine

## 2017-11-28 NOTE — Progress Notes (Signed)
Chief Complaint  Patient presents with  . Follow-up    cough    HPI: [name] [name] 62 y.o. come in for fu cough on going  Seen  Dr Martinique  6 25  given [pred and gerd plan  Cough comes and goes worse at niht no fever  Some nasal congstion now no pain  Some phelgm  Clear to milkish color   .    Took prednisone   Maybe some better temproarily  used rescue inhaler  At night feels wheezy and needs to cough   In feb march took breo and that cough resolved and she stopped  Concern about  Eye effects as has inc IOP?  But  May have helped the breo   Since last visit has only used the acid blocker   Every other day.   r flank hit  Against ccar mirror very hard  5 day ago  hurts deep but no bruise  Some inc with inspiration  No hematuria grossly  ROS: See pertinent positives and negatives per HPI. Had pfts done 2012 and nl  Mom in 90s copd but no hx tobacco?  Past Medical History:  Diagnosis Date  . Arthritis   . Depression    stable on medication  . Diverticulitis 2015  . Diverticulosis   . Gallstones   . GERD (gastroesophageal reflux disease)   . Hyperlipidemia    no meds needed  . IBS (irritable bowel syndrome)   . Osteopenia   . PUD (peptic ulcer disease) 1980's  . Renal cyst    4.5-6 cm stable single  seen by uro in past    Family History  Problem Relation Age of Onset  . Rheum arthritis Father   . Pneumonia Father        deceased  . Colon cancer Father 69  . Heart attack Father   . Breast cancer Sister 61  . Breast cancer Paternal Grandmother 76  . Osteoarthritis Mother   . COPD Mother   . Pneumonia Mother   . Osteoporosis Mother   . Cancer Maternal Grandmother        ? GI  . Heart failure Maternal Grandfather   . Heart failure Paternal Grandfather   . Esophageal cancer Neg Hx   . Rectal cancer Neg Hx   . Stomach cancer Neg Hx     Social History   Socioeconomic History  . Marital status: Divorced    Spouse name: Not on file  . Number of children: 0    . Years of education: Not on file  . Highest education level: Not on file  Occupational History  . Occupation: Product manager: [employer]  . Financial resource strain: Not on file  . Food insecurity:    Worry: Not on file    Inability: Not on file  . Transportation needs:    Medical: Not on file    Non-medical: Not on file  Tobacco Use  . Smoking status: Never Smoker  . Smokeless tobacco: Never Used  Substance and Sexual Activity  . Alcohol use: No    Frequency: Never    Comment: occasionally  . Drug use: No  . Sexual activity: Never    Birth control/protection: Post-menopausal  Lifestyle  . Physical activity:    Days per week: Not on file    Minutes per session: Not on file  . Stress: Not on file  Relationships  . Social connections:  Talks on phone: Not on file    Gets together: Not on file    Attends religious service: Not on file    Active member of club or organization: Not on file    Attends meetings of clubs or organizations: Not on file    Relationship status: Not on file  Other Topics Concern  . Not on file  Social History Narrative   Teacher media specialist graduate degree Oncologist.   Non smoker    Hh of 1 ... 2 cats    Single   G0P0   Mom is now in assisted living and she has moved into her old residence    Outpatient Medications Prior to Visit  Medication Sig Dispense Refill  . albuterol (PROVENTIL HFA;VENTOLIN HFA) 108 (90 Base) MCG/ACT inhaler Inhale 1-2 puffs into the lungs every 6 (six) hours as needed for wheezing or shortness of breath. 1 Inhaler 0  . Cholecalciferol (VITAMIN D PO) Take 2,000 mg by mouth every other day.     Marland Kitchen FLUoxetine (PROZAC) 20 MG tablet Take 20 mg by mouth daily.    . Multiple Vitamins-Minerals (CENTRUM ULTRA WOMENS PO) Take 1 tablet by mouth every other day.     Marland Kitchen omeprazole (PRILOSEC) 20 MG capsule Take 1 capsule (20 mg total) by mouth daily. 30 capsule 11  . Probiotic Product  (PROBIOTIC PO) Take by mouth daily.    . propranolol (INDERAL) 10 MG tablet Take 10 mg by mouth as needed.      No facility-administered medications prior to visit.      EXAM:  BP 121/69   Pulse 85   Temp 98.6 F (37 C)   Wt 173 lb (78.5 kg)   LMP 11/18/2004   BMI 29.00 kg/m   Body mass index is 29 kg/m.  GENERAL: vitals reviewed and listed above, alert, oriented, appears well hydrated and in no acute distress mild nasal congestion   No resp distress  HEENT: atraumatic, conjunctiva  clear, no obvious abnormalities on inspection of external nose and ears tm clear face non tender  NECK: no obvious masses on inspection palpation  LUNGS: clear to auscultation bilaterally, no wheezes, rales or rhonchi, good air movement hsa tdner left flank pain but no bruise  CV: HRRR, no clubbing cyanosis or  peripheral edema nl cap refill  MS: moves all extremities without noticeable focal  abnormality PSYCH: pleasant and cooperative, no obvious depression or anxiety  BP Readings from Last 3 Encounters:  11/29/17 121/69  11/12/17 104/70  07/08/17 124/72   IMPRESSION: Mild chronic bronchitic-reactive airway changes, stable. No pneumonia, CHF, nor other acute cardiopulmonary abnormality.   Electronically Signed   By: David  Martinique M.D.   On: 11/12/2017 16:19 ASSESSMENT AND PLAN:  Discussed the following assessment and plan:  Cough, persistent  Left flank pain - Plan: POCT Urinalysis Dipstick (Automated) Persistent cough with no alarm features similar problem that she had early in the year.  Has some concern with ongoing topical steroids because of question of increased IOP but she will asked her eye doctor about advisability.  The Breo in the past seem to help her after a month. There could be underlying silent reflux but that appears to be little less likely. See x-ray because she is having recurrent problems and this pain over the last few years advised pulmonary consult insight  about ongoing plan. In regard to her left flank pain after being hit in that area I suspected contusion not alarming but get urinalysis  today no bruising is noted give this time if persistent progressive reevaluate.  -Patient advised to return or notify health care team  if  new concerns arise.  Patient Instructions   Ask your eye doctor about   breo and  Also    flonase nose spray . And if ok we may add these meds   If ok I will ask yoy to  Ad theses meds   To control   Chronic sinus congestion and   Thus coughing .    YOu may have a version of   Wheezy bronchitis .  That flares up when you get  A  Chest cold or other irritant   Yet silent reflux can also add to the problem  But harder to make that dx.   Will  Do  pulmonary referral for hx of recurrent  Coughing illnesses .     Standley Brooking. Panosh M.D.

## 2017-11-28 NOTE — Progress Notes (Signed)
62 y.o. G0P0000 DivorcedCaucasianF here for annual exam. No vaginal bleeding, not sexually active. Mild, tolerable GSI. No bowel c/o.      Patient's last menstrual period was 11/18/2004.          Sexually active: No.  The current method of family planning is post menopausal status.    Exercising: Yes.    walking Smoker:  no  Health Maintenance: Pap:  01-04-15 negative, HR HPV negative           08-2011 negative, HR HPV negative  History of abnormal Pap:  yes MMG:  03-07-17 BIRADS 1 negative  Colonoscopy:  10- 10-14 diverticulitis, repeat 5 years  BMD:   03-07-17 osteopenia, Frax 7.6%/0.5%  TDaP:  05-13-13     reports that she has never smoked. She has never used smokeless tobacco. She reports that she does not drink alcohol or use drugs. She works as a Sports coach  Past Medical History:  Diagnosis Date  . Arthritis   . Depression    stable on medication  . Diverticulitis 2015  . Diverticulosis   . Gallstones   . GERD (gastroesophageal reflux disease)   . Hyperlipidemia    no meds needed  . IBS (irritable bowel syndrome)   . Osteopenia   . PUD (peptic ulcer disease) 1980's  . Renal cyst    4.5-6 cm stable single  seen by uro in past    Past Surgical History:  Procedure Laterality Date  . CHOLECYSTECTOMY  1987  . COLONOSCOPY    . IRRIGATION AND DEBRIDEMENT SEBACEOUS CYST Right 3/ 2014   chest wall  . UPPER GASTROINTESTINAL ENDOSCOPY    . WISDOM TOOTH EXTRACTION  age 36 & 59   lower teeth done first and upper wisdom teeth last    Current Outpatient Medications  Medication Sig Dispense Refill  . albuterol (PROVENTIL HFA;VENTOLIN HFA) 108 (90 Base) MCG/ACT inhaler Inhale 1-2 puffs into the lungs every 6 (six) hours as needed for wheezing or shortness of breath. 1 Inhaler 0  . Cholecalciferol (VITAMIN D PO) Take 2,000 mg by mouth every other day.     Marland Kitchen FLUoxetine (PROZAC) 20 MG tablet Take 20 mg by mouth daily.    . Multiple Vitamins-Minerals (CENTRUM ULTRA  WOMENS PO) Take 1 tablet by mouth every other day.     Marland Kitchen omeprazole (PRILOSEC) 20 MG capsule Take 1 capsule (20 mg total) by mouth daily. 30 capsule 11  . Probiotic Product (PROBIOTIC PO) Take by mouth daily.    . propranolol (INDERAL) 10 MG tablet Take 10 mg by mouth as needed.      No current facility-administered medications for this visit.     Family History  Problem Relation Age of Onset  . Rheum arthritis Father   . Pneumonia Father        deceased  . Colon cancer Father 79  . Heart attack Father   . Breast cancer Sister 40  . Breast cancer Paternal Grandmother 76  . Osteoarthritis Mother   . COPD Mother   . Pneumonia Mother   . Osteoporosis Mother   . Cancer Maternal Grandmother        ? GI  . Heart failure Maternal Grandfather   . Heart failure Paternal Grandfather   . Esophageal cancer Neg Hx   . Rectal cancer Neg Hx   . Stomach cancer Neg Hx   Father with colon cancer at 9 Sister with breast cancer 45  Review of Systems  Constitutional: Negative.  HENT: Negative.   Eyes: Negative.   Respiratory: Negative.   Cardiovascular: Negative.   Gastrointestinal: Negative.   Endocrine: Negative.   Genitourinary:       Loss of urine with cough or sneeze  Musculoskeletal: Negative.   Skin: Negative.   Allergic/Immunologic: Negative.   Neurological: Negative.   Hematological: Negative.   Psychiatric/Behavioral: Negative.     Exam:   BP 110/60 (BP Location: Right Arm, Patient Position: Sitting, Cuff Size: Normal)   Pulse 72   Resp 14   Ht 5\' 5"  (1.651 m)   Wt 172 lb 3.2 oz (78.1 kg)   LMP 11/18/2004   BMI 28.66 kg/m   Weight change: @WEIGHTCHANGE @ Height:   Height: 5\' 5"  (165.1 cm)  Ht Readings from Last 3 Encounters:  11/29/17 5\' 5"  (1.651 m)  11/12/17 5' 4.76" (1.645 m)  06/28/17 5\' 5"  (1.651 m)    General appearance: alert, cooperative and appears stated age Head: Normocephalic, without obvious abnormality, atraumatic Neck: no adenopathy, supple,  symmetrical, trachea midline and thyroid normal to inspection and palpation Lungs: clear to auscultation bilaterally Cardiovascular: regular rate and rhythm Breasts: normal appearance, no masses or tenderness Abdomen: soft, non-tender; non distended,  no masses,  no organomegaly Extremities: extremities normal, atraumatic, no cyanosis or edema Skin: Skin color, texture, turgor normal. No rashes or lesions Lymph nodes: Cervical, supraclavicular, and axillary nodes normal. No abnormal inguinal nodes palpated Neurologic: Grossly normal   Pelvic: External genitalia:  no lesions              Urethra:  normal appearing urethra with no masses, tenderness or lesions              Bartholins and Skenes: normal                 Vagina: atrophic appearing vagina with normal color and discharge, no lesions              Cervix: no lesions               Bimanual Exam:  Uterus:  normal size, contour, position, consistency, mobility, non-tender              Adnexa: no mass, fullness, tenderness               Rectovaginal: Confirms               Anus:  normal sphincter tone, no lesions  Chaperone was present for exam.  A:  Well Woman with normal exam  Osteopenia, on calcium vit d and exercising    P:   Pap with hpv  Mammogram and colonoscopy in the fall  Discussed breast self exam  Discussed calcium and vit D intake  DEXA in 10/20  Labs with primary MD

## 2017-11-29 ENCOUNTER — Ambulatory Visit: Payer: BC Managed Care – PPO | Admitting: Internal Medicine

## 2017-11-29 ENCOUNTER — Ambulatory Visit: Payer: BC Managed Care – PPO | Admitting: Obstetrics and Gynecology

## 2017-11-29 ENCOUNTER — Encounter: Payer: Self-pay | Admitting: Obstetrics and Gynecology

## 2017-11-29 ENCOUNTER — Other Ambulatory Visit (HOSPITAL_COMMUNITY)
Admission: RE | Admit: 2017-11-29 | Discharge: 2017-11-29 | Disposition: A | Discharge status: Home / Self Care | Payer: BC Managed Care – PPO | Source: Ambulatory Visit | Attending: Obstetrics and Gynecology | Admitting: Obstetrics and Gynecology

## 2017-11-29 ENCOUNTER — Encounter: Payer: Self-pay | Admitting: Internal Medicine

## 2017-11-29 ENCOUNTER — Other Ambulatory Visit: Payer: Self-pay

## 2017-11-29 VITALS — BP 121/69 | HR 85 | Temp 98.6°F | Wt 173.0 lb

## 2017-11-29 VITALS — BP 110/60 | HR 72 | Resp 14 | Ht 65.0 in | Wt 172.2 lb

## 2017-11-29 DIAGNOSIS — R05 Cough: Secondary | ICD-10-CM | POA: Diagnosis not present

## 2017-11-29 DIAGNOSIS — Z01419 Encounter for gynecological examination (general) (routine) without abnormal findings: Secondary | ICD-10-CM

## 2017-11-29 DIAGNOSIS — R053 Chronic cough: Secondary | ICD-10-CM

## 2017-11-29 DIAGNOSIS — R109 Unspecified abdominal pain: Secondary | ICD-10-CM | POA: Diagnosis not present

## 2017-11-29 DIAGNOSIS — J40 Bronchitis, not specified as acute or chronic: Secondary | ICD-10-CM | POA: Diagnosis not present

## 2017-11-29 DIAGNOSIS — Z124 Encounter for screening for malignant neoplasm of cervix: Secondary | ICD-10-CM | POA: Insufficient documentation

## 2017-11-29 DIAGNOSIS — M8580 Other specified disorders of bone density and structure, unspecified site: Secondary | ICD-10-CM

## 2017-11-29 LAB — POC URINALSYSI DIPSTICK (AUTOMATED)
Bilirubin, UA: NEGATIVE
Blood, UA: NEGATIVE
Glucose, UA: NEGATIVE
Ketones, UA: NEGATIVE
Leukocytes, UA: NEGATIVE
Nitrite, UA: NEGATIVE
Protein, UA: NEGATIVE
Spec Grav, UA: 1.02 (ref 1.010–1.025)
Urobilinogen, UA: 0.2 E.U./dL
pH, UA: 6 (ref 5.0–8.0)

## 2017-11-29 NOTE — Patient Instructions (Addendum)
  Ask your eye doctor about   breo and  Also    flonase nose spray . And if ok we may add these meds   If ok I will ask yoy to  Ad theses meds   To control   Chronic sinus congestion and   Thus coughing .    YOu may have a version of   Wheezy bronchitis .  That flares up when you get  A  Chest cold or other irritant   Yet silent reflux can also add to the problem  But harder to make that dx.   Will  Do  pulmonary referral for hx of recurrent  Coughing illnesses .

## 2017-11-29 NOTE — Patient Instructions (Signed)
EXERCISE AND DIET:  We recommended that you start or continue a regular exercise program for good health. Regular exercise means any activity that makes your heart beat faster and makes you sweat.  We recommend exercising at least 30 minutes per day at least 3 days a week, preferably 4 or 5.  We also recommend a diet low in fat and sugar.  Inactivity, poor dietary choices and obesity can cause diabetes, heart attack, stroke, and kidney damage, among others.    ALCOHOL AND SMOKING:  Women should limit their alcohol intake to no more than 7 drinks/beers/glasses of wine (combined, not each!) per week. Moderation of alcohol intake to this level decreases your risk of breast cancer and liver damage. And of course, no recreational drugs are part of a healthy lifestyle.  And absolutely no smoking or even second hand smoke. Most people know smoking can cause heart and lung diseases, but did you know it also contributes to weakening of your bones? Aging of your skin?  Yellowing of your teeth and nails?  CALCIUM AND VITAMIN D:  Adequate intake of calcium and Vitamin D are recommended.  The recommendations for exact amounts of these supplements seem to change often, but generally speaking 600 mg of calcium (either carbonate or citrate) and 800 units of Vitamin D per day seems prudent. Certain women may benefit from higher intake of Vitamin D.  If you are among these women, your doctor will have told you during your visit.    PAP SMEARS:  Pap smears, to check for cervical cancer or precancers,  have traditionally been done yearly, although recent scientific advances have shown that most women can have pap smears less often.  However, every woman still should have a physical exam from her gynecologist every year. It will include a breast check, inspection of the vulva and vagina to check for abnormal growths or skin changes, a visual exam of the cervix, and then an exam to evaluate the size and shape of the uterus and  ovaries.  And after 63 years of age, a rectal exam is indicated to check for rectal cancers. We will also provide age appropriate advice regarding health maintenance, like when you should have certain vaccines, screening for sexually transmitted diseases, bone density testing, colonoscopy, mammograms, etc.   MAMMOGRAMS:  All women over 40 years old should have a yearly mammogram. Many facilities now offer a "3D" mammogram, which may cost around $50 extra out of pocket. If possible,  we recommend you accept the option to have the 3D mammogram performed.  It both reduces the number of women who will be called back for extra views which then turn out to be normal, and it is better than the routine mammogram at detecting truly abnormal areas.    COLONOSCOPY:  Colonoscopy to screen for colon cancer is recommended for all women at age 50.  We know, you hate the idea of the prep.  We agree, BUT, having colon cancer and not knowing it is worse!!  Colon cancer so often starts as a polyp that can be seen and removed at colonscopy, which can quite literally save your life!  And if your first colonoscopy is normal and you have no family history of colon cancer, most women don't have to have it again for 10 years.  Once every ten years, you can do something that may end up saving your life, right?  We will be happy to help you get it scheduled when you are ready.    Be sure to check your insurance coverage so you understand how much it will cost.  It may be covered as a preventative service at no cost, but you should check your particular policy.      Breast Self-Awareness Breast self-awareness means being familiar with how your breasts look and feel. It involves checking your breasts regularly and reporting any changes to your health care provider. Practicing breast self-awareness is important. A change in your breasts can be a sign of a serious medical problem. Being familiar with how your breasts look and feel allows  you to find any problems early, when treatment is more likely to be successful. All women should practice breast self-awareness, including women who have had breast implants. How to do a breast self-exam One way to learn what is normal for your breasts and whether your breasts are changing is to do a breast self-exam. To do a breast self-exam: Look for Changes  1. Remove all the clothing above your waist. 2. Stand in front of a mirror in a room with good lighting. 3. Put your hands on your hips. 4. Push your hands firmly downward. 5. Compare your breasts in the mirror. Look for differences between them (asymmetry), such as: ? Differences in shape. ? Differences in size. ? Puckers, dips, and bumps in one breast and not the other. 6. Look at each breast for changes in your skin, such as: ? Redness. ? Scaly areas. 7. Look for changes in your nipples, such as: ? Discharge. ? Bleeding. ? Dimpling. ? Redness. ? A change in position. Feel for Changes  Carefully feel your breasts for lumps and changes. It is best to do this while lying on your back on the floor and again while sitting or standing in the shower or tub with soapy water on your skin. Feel each breast in the following way:  Place the arm on the side of the breast you are examining above your head.  Feel your breast with the other hand.  Start in the nipple area and make  inch (2 cm) overlapping circles to feel your breast. Use the pads of your three middle fingers to do this. Apply light pressure, then medium pressure, then firm pressure. The light pressure will allow you to feel the tissue closest to the skin. The medium pressure will allow you to feel the tissue that is a little deeper. The firm pressure will allow you to feel the tissue close to the ribs.  Continue the overlapping circles, moving downward over the breast until you feel your ribs below your breast.  Move one finger-width toward the center of the body.  Continue to use the  inch (2 cm) overlapping circles to feel your breast as you move slowly up toward your collarbone.  Continue the up and down exam using all three pressures until you reach your armpit.  Write Down What You Find  Write down what is normal for each breast and any changes that you find. Keep a written record with breast changes or normal findings for each breast. By writing this information down, you do not need to depend only on memory for size, tenderness, or location. Write down where you are in your menstrual cycle, if you are still menstruating. If you are having trouble noticing differences in your breasts, do not get discouraged. With time you will become more familiar with the variations in your breasts and more comfortable with the exam. How often should I examine my breasts? Examine   your breasts every month. If you are breastfeeding, the best time to examine your breasts is after a feeding or after using a breast pump. If you menstruate, the best time to examine your breasts is 5-7 days after your period is over. During your period, your breasts are lumpier, and it may be more difficult to notice changes. When should I see my health care provider? See your health care provider if you notice:  A change in shape or size of your breasts or nipples.  A change in the skin of your breast or nipples, such as a reddened or scaly area.  Unusual discharge from your nipples.  A lump or thick area that was not there before.  Pain in your breasts.  Anything that concerns you.  This information is not intended to replace advice given to you by your health care provider. Make sure you discuss any questions you have with your health care provider. Document Released: 05/07/2005 Document Revised: 10/13/2015 Document Reviewed: 03/27/2015 Elsevier Interactive Patient Education  2018 [employer]  

## 2017-12-02 LAB — CYTOLOGY - PAP
Diagnosis: NEGATIVE
HPV: NOT DETECTED

## 2017-12-05 ENCOUNTER — Telehealth: Payer: Self-pay

## 2017-12-05 NOTE — Telephone Encounter (Signed)
Copied from [place] [phone]. Topic: Inquiry >> Dec 05, 2017  4:06 PM Conception Chancy, NT wrote: Reason for CRM: patient is calling and states that Dr. Regis Bill wanted her to call back about her eye doctor appointment. She states she has a eye doctor appointment on Aug. 8th and was able to start the 2 medications. She states she has not heard from a respiratory doctor. Please advise.

## 2017-12-09 NOTE — Telephone Encounter (Signed)
I spoke with pt and the referral was made, gave the phone number to pt and she will give them a call.

## 2017-12-12 ENCOUNTER — Other Ambulatory Visit: Payer: Self-pay | Admitting: Gastroenterology

## 2017-12-27 ENCOUNTER — Ambulatory Visit: Payer: BC Managed Care – PPO | Admitting: Pulmonary Disease

## 2017-12-27 ENCOUNTER — Encounter: Payer: Self-pay | Admitting: Pulmonary Disease

## 2017-12-27 ENCOUNTER — Other Ambulatory Visit (INDEPENDENT_AMBULATORY_CARE_PROVIDER_SITE_OTHER): Payer: BC Managed Care – PPO

## 2017-12-27 VITALS — BP 118/76 | HR 83 | Ht 65.0 in | Wt 175.2 lb

## 2017-12-27 DIAGNOSIS — R05 Cough: Secondary | ICD-10-CM

## 2017-12-27 DIAGNOSIS — R059 Cough, unspecified: Secondary | ICD-10-CM

## 2017-12-27 LAB — CBC WITH DIFFERENTIAL/PLATELET
Basophils Absolute: 0.1 10*3/uL (ref 0.0–0.1)
Basophils Relative: 2.2 % (ref 0.0–3.0)
Eosinophils Absolute: 0.1 10*3/uL (ref 0.0–0.7)
Eosinophils Relative: 1.6 % (ref 0.0–5.0)
HCT: 39.1 % (ref 36.0–46.0)
Hemoglobin: 13.2 g/dL (ref 12.0–15.0)
Lymphocytes Relative: 25.9 % (ref 12.0–46.0)
Lymphs Abs: 1.5 10*3/uL (ref 0.7–4.0)
MCHC: 33.7 g/dL (ref 30.0–36.0)
MCV: 89.3 fl (ref 78.0–100.0)
Monocytes Absolute: 0.6 10*3/uL (ref 0.1–1.0)
Monocytes Relative: 9.7 % (ref 3.0–12.0)
Neutro Abs: 3.6 10*3/uL (ref 1.4–7.7)
Neutrophils Relative %: 60.6 % (ref 43.0–77.0)
Platelets: 263 10*3/uL (ref 150.0–400.0)
RBC: 4.37 Mil/uL (ref 3.87–5.11)
RDW: 13.8 % (ref 11.5–15.5)
WBC: 5.9 10*3/uL (ref 4.0–10.5)

## 2017-12-27 MED ORDER — FLUTICASONE FUROATE-VILANTEROL 200-25 MCG/INH IN AEPB: 1.0000 | INHALATION_SPRAY | Freq: Every day | RESPIRATORY_TRACT | 2 refills | Status: DC

## 2017-12-27 NOTE — Patient Instructions (Signed)
Persistent cough  Did notice some improvement with Breo, does not feel acutely ill at present  May have underlying allergies   Breo 200, 1 puff daily  Allergy panel  Spirometry  CBC with eosinophil counts  I will see her back in the office in about 3 months  Avoid known triggers, call with any decompensation

## 2017-12-27 NOTE — Progress Notes (Signed)
[name] [name]    [id number]    May 26, 1955  Primary Care Physician:Panosh, Standley Brooking, MD  Referring Physician: Burnis Medin, [place], [place] [address]  Chief complaint:   Persistent cough since February  HPI:  She has been on Breo recently which seems to be helping symptoms Cough is not completely gone but better than what it was before She does not feel she has an acute infection at the present time  Denies any chest pains or chest discomfort Never diagnosed with asthma  History of allergies in the past  Denies any fevers or chills She does bring up just clear phlegm on occasions  On medications for reflux  Occupation: No pertinent occupational history  exposures: No exposure to mold Smoking history: Non-smoker Travel history:no Pertinent travel  Outpatient Encounter Medications as of 12/27/2017  Medication Sig  . albuterol (PROVENTIL HFA;VENTOLIN HFA) 108 (90 Base) MCG/ACT inhaler Inhale 1-2 puffs into the lungs every 6 (six) hours as needed for wheezing or shortness of breath.  . Cholecalciferol (VITAMIN D PO) Take 2,000 mg by mouth every other day.   Marland Kitchen FLUoxetine (PROZAC) 20 MG tablet Take 20 mg by mouth daily.  . Multiple Vitamins-Minerals (CENTRUM ULTRA WOMENS PO) Take 1 tablet by mouth every other day.   Marland Kitchen omeprazole (PRILOSEC) 20 MG capsule TAKE 1 CAPSULE BY MOUTH ONCE DAILY  . Probiotic Product (PROBIOTIC PO) Take by mouth daily.  . propranolol (INDERAL) 10 MG tablet Take 10 mg by mouth as needed.    No facility-administered encounter medications on file as of 12/27/2017.     Allergies as of 12/27/2017  . (No Known Allergies)    Past Medical History:  Diagnosis Date  . Arthritis   . Depression    stable on medication  . Diverticulitis 2015  . Diverticulosis   . Gallstones   . GERD (gastroesophageal reflux disease)   . Hyperlipidemia    no meds needed  . IBS (irritable bowel syndrome)   . Osteopenia   . PUD  (peptic ulcer disease) 1980's  . Renal cyst    4.5-6 cm stable single  seen by uro in past    Past Surgical History:  Procedure Laterality Date  . CHOLECYSTECTOMY  1987  . COLONOSCOPY    . IRRIGATION AND DEBRIDEMENT SEBACEOUS CYST Right 3/ 2014   chest wall  . UPPER GASTROINTESTINAL ENDOSCOPY    . WISDOM TOOTH EXTRACTION  age 48 & 46   lower teeth done first and upper wisdom teeth last    Family History  Problem Relation Age of Onset  . Rheum arthritis Father   . Pneumonia Father        deceased  . Colon cancer Father 4  . Heart attack Father   . Breast cancer Sister 89  . Breast cancer Paternal Grandmother 29  . Osteoarthritis Mother   . COPD Mother   . Pneumonia Mother   . Osteoporosis Mother   . Cancer Maternal Grandmother        ? GI  . Heart failure Maternal Grandfather   . Heart failure Paternal Grandfather   . Esophageal cancer Neg Hx   . Rectal cancer Neg Hx   . Stomach cancer Neg Hx     Social History   Socioeconomic History  . Marital status: Divorced    Spouse name: Not on file  . Number of children: 0  . Years of education: Not on file  .  Highest education level: Not on file  Occupational History  . Occupation: Product manager: [employer]  . Financial resource strain: Not on file  . Food insecurity:    Worry: Not on file    Inability: Not on file  . Transportation needs:    Medical: Not on file    Non-medical: Not on file  Tobacco Use  . Smoking status: Never Smoker  . Smokeless tobacco: Never Used  Substance and Sexual Activity  . Alcohol use: No    Frequency: Never    Comment: occasionally  . Drug use: No  . Sexual activity: Never    Birth control/protection: Post-menopausal  Lifestyle  . Physical activity:    Days per week: Not on file    Minutes per session: Not on file  . Stress: Not on file  Relationships  . Social connections:    Talks on phone: Not on file    Gets together: Not on file     Attends religious service: Not on file    Active member of club or organization: Not on file    Attends meetings of clubs or organizations: Not on file    Relationship status: Not on file  . Intimate partner violence:    Fear of current or ex partner: Not on file    Emotionally abused: Not on file    Physically abused: Not on file    Forced sexual activity: Not on file  Other Topics Concern  . Not on file  Social History Narrative   Teacher media specialist graduate degree Oncologist.   Non smoker    Hh of 1 ... 2 cats    Single   G0P0   Mom is now in assisted living and she has moved into her old residence    Review of systems: Review of Systems  Constitutional: Negative for fever and chills.  HENT: Negative.   Eyes: Negative for blurred vision.  Respiratory: as per HPI  Cardiovascular: Negative for chest pain and palpitations.  Gastrointestinal: Negative for vomiting, diarrhea, blood per rectum. Genitourinary: Negative for dysuria, urgency, frequency and hematuria.  Musculoskeletal: Negative for myalgias, back pain and joint pain.  Skin: Negative for itching and rash.  Neurological: Negative for dizziness, tremors, focal weakness, seizures and loss of consciousness.  Endo/Heme/Allergies: Negative for environmental allergies.  Psychiatric/Behavioral: Negative for depression, suicidal ideas and hallucinations.  All other systems reviewed and are negative.  Physical Exam:  Vitals:   12/27/17 1459  BP: 118/76  Pulse: 83  SpO2: 94%   Gen:      No acute distress HEENT:  EOMI, sclera anicteric Neck:     No masses; no thyromegaly Lungs:    Clear to auscultation bilaterally; normal respiratory effort CV:         Regular rate and rhythm; no murmurs Abd:      + bowel sounds; soft, non-tender; no palpable masses, no distension Ext:    No edema; adequate peripheral perfusion Skin:      Warm and dry; no rash Neuro: alert and oriented x 3 Psych: normal mood and  affect  Data Reviewed: Marland Kitchen  Previous pulmonary function study reviewed,  .  Records reviewed   Assessment:  .  Protracted cough  .  Bronchitis, likely chronic bronchitis  .  Reflux  .  Possible environmental allergies triggered symptoms  Plan/Recommendations:  .  Obtain pulmonary function study  .  Obtain allergy panel  .  Continue  Breo 200  .  CBC with eosinophil counts  She does admit to occasional snoring, tiredness We did discuss possibility of sleep disordered breathing which she will research further.   Sherrilyn Rist MD [facility] 12/27/2017, 3:04 PM  CC: Panosh, Standley Brooking, MD

## 2018-01-16 ENCOUNTER — Encounter: Payer: Self-pay | Admitting: Gastroenterology

## 2018-01-16 ENCOUNTER — Ambulatory Visit: Payer: BC Managed Care – PPO | Admitting: Gastroenterology

## 2018-01-16 VITALS — BP 114/62 | HR 68 | Ht 65.0 in | Wt 176.4 lb

## 2018-01-16 DIAGNOSIS — Z800 Family history of malignant neoplasm of digestive organs: Secondary | ICD-10-CM | POA: Diagnosis not present

## 2018-01-16 DIAGNOSIS — K219 Gastro-esophageal reflux disease without esophagitis: Secondary | ICD-10-CM

## 2018-01-16 MED ORDER — OMEPRAZOLE 20 MG PO CPDR: 20.0000 mg | DELAYED_RELEASE_CAPSULE | Freq: Every day | ORAL | 3 refills | Status: DC

## 2018-01-16 MED ORDER — PEG-KCL-NACL-NASULF-NA ASC-C 140 G PO SOLR: 140.0000 g | ORAL | 0 refills | Status: DC

## 2018-01-16 NOTE — Patient Instructions (Signed)
If you are age 63 or older, your body mass index should be between 23-30. Your Body mass index is 29.35 kg/m. If this is out of the aforementioned range listed, please consider follow up with your Primary Care Provider.  If you are age 39 or younger, your body mass index should be between 19-25. Your Body mass index is 29.35 kg/m. If this is out of the aformentioned range listed, please consider follow up with your Primary Care Provider.   You have been scheduled for a colonoscopy. Please follow written instructions given to you at your visit today.  Please pick up your prep supplies at the pharmacy within the next 1-3 days. If you use inhalers (even only as needed), please bring them with you on the day of your procedure. Your physician has requested that you go to www.startemmi.com and enter the access code given to you at your visit today. This web site gives a general overview about your procedure. However, you should still follow specific instructions given to you by our office regarding your preparation for the procedure.  It was a pleasure to see you today!  Dr. Loletha Carrow

## 2018-01-16 NOTE — Progress Notes (Signed)
[facility] Progress Note  Chief Complaint: GERD and family history of colon cancer  Subjective  History:  [name] [name] last saw me a year ago for chronic stable GERD with occasional breakthrough symptoms while on omeprazole 20 mill grams daily.  We discussed de-escalation of anti-secretory therapy, but she was disinclined to do that because she has had such good symptom control. Her father had colorectal cancer, and her last colonoscopy was in October 2014.  She is doing well with occasional breakthrough symptoms of heartburn or regurgitation, sometimes if she bends over with certain food triggers.  She denies dysphagia or odynophagia, nausea vomiting or weight loss.  She is gained about 5 pounds over the summer. 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  ROS: Cardiovascular:  no chest pain Respiratory: no dyspnea  The patient's Past Medical, Family and Social History were reviewed and are on file in the EMR.  Objective:  Med list reviewed  Current Outpatient Medications:  .  albuterol (PROVENTIL HFA;VENTOLIN HFA) 108 (90 Base) MCG/ACT inhaler, Inhale 1-2 puffs into the lungs every 6 (six) hours as needed for wheezing or shortness of breath., Disp: 1 Inhaler, Rfl: 0 .  Cholecalciferol (VITAMIN D PO), Take 2,000 mg by mouth every other day. , Disp: , Rfl:  .  FLUoxetine (PROZAC) 20 MG tablet, Take 20 mg by mouth daily., Disp: , Rfl:  .  fluticasone furoate-vilanterol (BREO ELLIPTA) 200-25 MCG/INH AEPB, Inhale 1 puff into the lungs daily., Disp: 1 each, Rfl: 2 .  Multiple Vitamins-Minerals (CENTRUM ULTRA WOMENS PO), Take 1 tablet by mouth every other day. , Disp: , Rfl:  .  omeprazole (PRILOSEC) 20 MG capsule, Take 1 capsule (20 mg total) by mouth daily., Disp: 90 capsule, Rfl: 3 .  Probiotic Product (PROBIOTIC PO), Take by mouth daily., Disp: , Rfl:  .  propranolol (INDERAL) 10 MG tablet, Take 10 mg by mouth as needed. , Disp: , Rfl:  .   PEG-KCl-NaCl-NaSulf-Na Asc-C (PLENVU) 140 g SOLR, Take 140 g by mouth as directed., Disp: 1 each, Rfl: 0   Vital signs in last 24 hrs: Vitals:   01/16/18 1603  BP: 114/62  Pulse: 68    Physical Exam  Well-appearing, normal vocal quality.  HEENT: sclera anicteric, oral mucosa moist without lesions  Neck: supple, no thyromegaly, JVD or lymphadenopathy  Cardiac: RRR without murmurs, S1S2 heard, no peripheral edema  Pulm: clear to auscultation bilaterally, normal RR and effort noted  Abdomen: soft, no tenderness, with active bowel sounds. No guarding or palpable hepatosplenomegaly.  Skin; warm and dry, no jaundice or rash   @ASSESSMENTPLANBEGIN @ Assessment: Encounter Diagnoses  Name Primary?  . Gastroesophageal reflux disease without esophagitis Yes  . Family history of colon cancer in father    Chronic stable GERD symptoms without red flag signs.  She still does not want to de-escalate therapy.  History of colon cancer, due for screening colonoscopy.  She was agreeable after discussion of procedure and risks.  The benefits and risks of the planned procedure were described in detail with the patient or (when appropriate) their health care proxy.  Risks were outlined as including, but not limited to, bleeding, infection, perforation, adverse medication reaction leading to cardiac or pulmonary decompensation, or pancreatitis (if ERCP).  The limitation of incomplete mucosal visualization was also discussed.  No guarantees or warranties were given.    Total time 15 minutes, over half spent face-to-face with patient in counseling and coordination of care.  [name] [name]

## 2018-03-05 ENCOUNTER — Other Ambulatory Visit: Payer: Self-pay | Admitting: Obstetrics and Gynecology

## 2018-03-05 DIAGNOSIS — Z1231 Encounter for screening mammogram for malignant neoplasm of breast: Secondary | ICD-10-CM

## 2018-03-07 ENCOUNTER — Ambulatory Visit (AMBULATORY_SURGERY_CENTER): Payer: Self-pay

## 2018-03-07 VITALS — Ht 65.0 in | Wt 176.0 lb

## 2018-03-07 DIAGNOSIS — Z800 Family history of malignant neoplasm of digestive organs: Secondary | ICD-10-CM

## 2018-03-07 NOTE — Progress Notes (Signed)
No egg or soy allergy known to patient  No issues with past sedation with any surgeries  or procedures, no intubation problems  No diet pills per patient No home 02 use per patient  No blood thinners per patient  Pt denies issues with constipation  No A fib or A flutter  EMMI video sent to pt's e mail  Pt. declined 

## 2018-03-10 ENCOUNTER — Encounter: Payer: BC Managed Care – PPO | Admitting: Gastroenterology

## 2018-03-12 ENCOUNTER — Ambulatory Visit (HOSPITAL_BASED_OUTPATIENT_CLINIC_OR_DEPARTMENT_OTHER)
Admission: RE | Admit: 2018-03-12 | Discharge: 2018-03-12 | Disposition: A | Discharge status: Home / Self Care | Payer: BC Managed Care – PPO | Source: Ambulatory Visit | Attending: Obstetrics and Gynecology | Admitting: Obstetrics and Gynecology

## 2018-03-12 DIAGNOSIS — Z1231 Encounter for screening mammogram for malignant neoplasm of breast: Secondary | ICD-10-CM | POA: Diagnosis present

## 2018-03-14 ENCOUNTER — Encounter: Payer: Self-pay | Admitting: Gastroenterology

## 2018-03-26 ENCOUNTER — Telehealth: Payer: Self-pay | Admitting: Gastroenterology

## 2018-03-26 NOTE — Telephone Encounter (Signed)
Called patient back, unfortunately she asked if she could call back since she had a conflict with work. I looked back at her office visits, other colonoscopy reports and Amy's notes from where she contacted Lexa. I would suggest that patient contact her insurance company.

## 2018-03-26 NOTE — Telephone Encounter (Signed)
Spoke to patient advised her to contact her insurance company to ask them, unfortunately I cannot help her with this as Amy's notes are very explicate. Patient asked that if she needed to cancel what is the latest, I asked her to let us know by the end of business day today.

## 2018-03-28 ENCOUNTER — Encounter: Payer: BC Managed Care – PPO | Admitting: Gastroenterology

## 2018-03-28 ENCOUNTER — Encounter: Payer: Self-pay | Admitting: Gastroenterology

## 2018-03-28 ENCOUNTER — Ambulatory Visit (AMBULATORY_SURGERY_CENTER): Payer: BC Managed Care – PPO | Admitting: Gastroenterology

## 2018-03-28 VITALS — BP 129/72 | HR 78 | Temp 96.8°F | Resp 17 | Ht 65.0 in | Wt 176.0 lb

## 2018-03-28 DIAGNOSIS — Z1211 Encounter for screening for malignant neoplasm of colon: Secondary | ICD-10-CM | POA: Diagnosis not present

## 2018-03-28 DIAGNOSIS — Z800 Family history of malignant neoplasm of digestive organs: Secondary | ICD-10-CM

## 2018-03-28 MED ORDER — SODIUM CHLORIDE 0.9 % IV SOLN: 500.0000 mL | Freq: Once | INTRAVENOUS | Status: DC

## 2018-03-28 NOTE — Patient Instructions (Signed)
YOU HAD AN ENDOSCOPIC PROCEDURE TODAY AT THE [facility]:   Refer to the procedure report that was given to you for any specific questions about what was found during the examination.  If the procedure report does not answer your questions, please call your gastroenterologist to clarify.  If you requested that your care partner not be given the details of your procedure findings, then the procedure report has been included in a sealed envelope for you to review at your convenience later.  YOU SHOULD EXPECT: Some feelings of bloating in the abdomen. Passage of more gas than usual.  Walking can help get rid of the air that was put into your GI tract during the procedure and reduce the bloating. If you had a lower endoscopy (such as a colonoscopy or flexible sigmoidoscopy) you may notice spotting of blood in your stool or on the toilet paper. If you underwent a bowel prep for your procedure, you may not have a normal bowel movement for a few days.  Please Note:  You might notice some irritation and congestion in your nose or some drainage.  This is from the oxygen used during your procedure.  There is no need for concern and it should clear up in a day or so.  SYMPTOMS TO REPORT IMMEDIATELY:   Following lower endoscopy (colonoscopy or flexible sigmoidoscopy):  Excessive amounts of blood in the stool  Significant tenderness or worsening of abdominal pains  Swelling of the abdomen that is new, acute  Fever of 100F or higher  For urgent or emergent issues, a gastroenterologist can be reached at any hour by calling [phone].   DIET:  We do recommend a small meal at first, but then you may proceed to your regular diet.  Drink plenty of fluids but you should avoid alcoholic beverages for 24 hours.  MEDICATIONS: Continue present medications.  Please see handouts given to you by your recovery nurse.  ACTIVITY:  You should plan to take it easy for the rest of today and you should NOT  DRIVE or use heavy machinery until tomorrow (because of the sedation medicines used during the test).    FOLLOW UP: Our staff will call the number listed on your records the next business day following your procedure to check on you and address any questions or concerns that you may have regarding the information given to you following your procedure. If we do not reach you, we will leave a message.  However, if you are feeling well and you are not experiencing any problems, there is no need to return our call.  We will assume that you have returned to your regular daily activities without incident.  If any biopsies were taken you will be contacted by phone or by letter within the next 1-3 weeks.  Please call us at [phone] if you have not heard about the biopsies in 3 weeks.   Thank you for allowing us to provide for your healthcare needs today.   SIGNATURES/CONFIDENTIALITY: You and/or your care partner have signed paperwork which will be entered into your electronic medical record.  These signatures attest to the fact that that the information above on your After Visit Summary has been reviewed and is understood.  Full responsibility of the confidentiality of this discharge information lies with you and/or your care-partner. 

## 2018-03-28 NOTE — Progress Notes (Signed)
Pt's states no medical or surgical changes since previsit or office visit. 

## 2018-03-28 NOTE — Op Note (Signed)
[facility] Patient Name: [name] [name] Procedure Date: 03/28/2018 12:12 PM MRN: [id number] Endoscopist: Mallie Mussel L. Loletha Carrow , MD Age: 62 Referring MD:  Date of Birth: Nov 23, 1955 Gender: Female Account #: [id number] Procedure:                Colonoscopy Indications:              Colon cancer screening in patient at increased                            risk: Colorectal cancer in father Medicines:                Monitored Anesthesia Care Procedure:                Pre-Anesthesia Assessment:                           - Prior to the procedure, a History and Physical                            was performed, and patient medications and                            allergies were reviewed. The patient's tolerance of                            previous anesthesia was also reviewed. The risks                            and benefits of the procedure and the sedation                            options and risks were discussed with the patient.                            All questions were answered, and informed consent                            was obtained. Prior Anticoagulants: The patient has                            taken no previous anticoagulant or antiplatelet                            agents. ASA Grade Assessment: II - A patient with                            mild systemic disease. After reviewing the risks                            and benefits, the patient was deemed in                            satisfactory condition to undergo the procedure.  After obtaining informed consent, the colonoscope                            was passed under direct vision. Throughout the                            procedure, the patient's blood pressure, pulse, and                            oxygen saturations were monitored continuously. The                            Colonoscope was introduced through the anus and                            advanced to the the cecum,  identified by                            appendiceal orifice and ileocecal valve. The                            colonoscopy was performed without difficulty. The                            patient tolerated the procedure well. The quality                            of the bowel preparation was excellent. The                            ileocecal valve, appendiceal orifice, and rectum                            were photographed. The quality of the bowel                            preparation was evaluated using the BBPS [facility]                            Bowel Preparation Scale) with scores of: Right                            Colon = 3, Transverse Colon = 3 and Left Colon = 3                            (entire mucosa seen well with no residual staining,                            small fragments of stool or opaque liquid). The                            total BBPS score equals 9. The bowel preparation  used was Plenvu. Scope In: 12:34:58 PM Scope Out: 12:49:26 PM Scope Withdrawal Time: 0 hours 9 minutes 37 seconds  Total Procedure Duration: 0 hours 14 minutes 28 seconds  Findings:                 The perianal and digital rectal examinations were                            normal.                           Diverticula were found in the left colon.                           The exam was otherwise without abnormality on                            direct and retroflexion views. Complications:            No immediate complications. Estimated Blood Loss:     Estimated blood loss: none. Impression:               - Diverticulosis in the left colon.                           - The examination was otherwise normal on direct                            and retroflexion views.                           - No specimens collected. Recommendation:           - Patient has a contact number available for                            emergencies. The signs and symptoms of potential                             delayed complications were discussed with the                            patient. Return to normal activities tomorrow.                            Written discharge instructions were provided to the                            patient.                           - Resume previous diet.                           - Continue present medications.                           - Repeat colonoscopy in 5 years for screening  purposes. Alexande Sheerin L. Loletha Carrow, MD 03/28/2018 12:53:10 PM This report has been signed electronically.

## 2018-03-28 NOTE — Progress Notes (Signed)
A/ox3 pleased with MAC, report to RN 

## 2018-03-31 ENCOUNTER — Telehealth: Payer: Self-pay | Admitting: *Deleted

## 2018-03-31 ENCOUNTER — Telehealth: Payer: Self-pay

## 2018-03-31 NOTE — Telephone Encounter (Signed)
No answer. Number identifier. Message left to call if questions or concerns and that we would also make another attempt to call later in the day.

## 2018-03-31 NOTE — Telephone Encounter (Signed)
Attempted to reach pt. With follow-up call following endoscopic procedure 03/28/2018.  LM on pt. Voice mail to call if she has any questions or concerns.

## 2018-04-29 ENCOUNTER — Ambulatory Visit: Payer: BC Managed Care – PPO | Admitting: Family Medicine

## 2018-04-29 ENCOUNTER — Encounter: Payer: Self-pay | Admitting: Family Medicine

## 2018-04-29 VITALS — BP 98/68 | HR 88 | Temp 100.6°F | Wt 174.0 lb

## 2018-04-29 DIAGNOSIS — J101 Influenza due to other identified influenza virus with other respiratory manifestations: Secondary | ICD-10-CM

## 2018-04-29 DIAGNOSIS — R52 Pain, unspecified: Secondary | ICD-10-CM

## 2018-04-29 LAB — POCT INFLUENZA A/B
Influenza A, POC: NEGATIVE
Influenza B, POC: POSITIVE — AB

## 2018-04-29 MED ORDER — OSELTAMIVIR PHOSPHATE 75 MG PO CAPS: 75.0000 mg | ORAL_CAPSULE | Freq: Two times a day (BID) | ORAL | 0 refills | Status: AC

## 2018-04-29 NOTE — Patient Instructions (Signed)

## 2018-04-29 NOTE — Progress Notes (Signed)
Subjective:    Patient ID: [name] [name], female    DOB: Oct 16, 1955, 61 y.o.   MRN: [id number]  No chief complaint on file.   HPI Patient was seen today for acute concern, typically seen by Dr. Regis Bill.  Pt with body aches, headaches, pain in neck, right ear pain, stuffy nose, sore arms, cough, sore throat, chills x2 days.  Pt states symptoms initially started with sinus/nasal congestion 2 weeks ago.  Pt endorses bloody nasal discharge in the mornings.  Pt has tried Advil for her symptoms.  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  Past Medical History:  Diagnosis Date  . Arthritis   . Depression    stable on medication  . Diverticulitis 2015  . Diverticulosis   . Gallstones   . GERD (gastroesophageal reflux disease)   . Hyperlipidemia    no meds needed  . IBS (irritable bowel syndrome)   . Osteopenia   . PUD (peptic ulcer disease) 1980's  . Renal cyst    4.5-6 cm stable single  seen by uro in past    No Known Allergies  ROS General: Denies fever, chills, night sweats, changes in weight, changes in appetite  +chills, changes in appetite, body aches HEENT: Denies changes in vision, rhinorrhea  +HAs, R ear pain, sore throat, stuffy nose, sinus congestion, soreness in R neck CV: Denies CP, palpitations, SOB, orthopnea Pulm: Denies SOB, wheezing  +cough GI: Denies abdominal pain, nausea, vomiting, diarrhea, constipation GU: Denies dysuria, hematuria, frequency, vaginal discharge Msk: Denies muscle cramps, joint pains  +muscle aches Neuro: Denies weakness, numbness, tingling Skin: Denies rashes, bruising Psych: Denies depression, anxiety, hallucinations    Objective:    Blood pressure 98/68, pulse 88, temperature (!) 100.6 F (38.1 C), temperature source Oral, weight 174 lb (78.9 kg), last menstrual period 11/18/2004, SpO2 95 %.  Gen. Pleasant, well-nourished, in no distress appears sick but nontoxic, normal affect   HEENT: [place]/AT, face  symmetric, no scleral icterus, PERRLA, nares patent with clear drainage and eyrthema, pharynx with mild erythema, no exudate.  TMs full b/l. Mild cervical lymphadenopathy, mild TTP in R neck. Lungs: no accessory muscle use, CTAB, no wheezes or rales Cardiovascular: RRR, no m/r/g, no peripheral edema Neuro:  A&Ox3, CN II-XII intact, normal gait Skin:  Warm, no lesions/ rash   Wt Readings from Last 3 Encounters:  04/29/18 174 lb (78.9 kg)  03/28/18 176 lb (79.8 kg)  03/07/18 176 lb (79.8 kg)    Lab Results  Component Value Date   WBC 5.9 12/27/2017   HGB 13.2 12/27/2017   HCT 39.1 12/27/2017   PLT 263.0 12/27/2017   GLUCOSE 87 10/10/2016   CHOL 196 05/06/2017   TRIG 86.0 05/06/2017   HDL 70.60 05/06/2017   LDLDIRECT 132.7 03/24/2013   LDLCALC 108 (H) 05/06/2017   ALT 17 10/10/2016   AST 23 10/10/2016   NA 137 10/10/2016   K 3.9 10/10/2016   CL 99 10/10/2016   CREATININE 0.75 10/10/2016   BUN 13 10/10/2016   CO2 29 10/10/2016   TSH 1.29 03/29/2016    Assessment/Plan:  Influenza B  -discussed supportive care -given handout - Plan: oseltamivir (TAMIFLU) 75 MG capsule  Body aches  -rapid flu test positive for influenza B  F/u prn  Grier Mitts, MD

## 2018-05-06 ENCOUNTER — Telehealth: Payer: Self-pay

## 2018-05-06 NOTE — Telephone Encounter (Signed)
Pt states that she needs a work note since she went back to work today from 04/29/18. [place] for the note

## 2018-05-06 NOTE — Telephone Encounter (Signed)
Pt last seen by Dr Volanda Napoleon 04/29/18 for Flu.  Pt has not returned work yet and is requesting a work note.   Please advise. Thanks.   Assessment/Plan: Influenza B  -discussed supportive care -given handout - Plan: oseltamivir (TAMIFLU) 75 MG capsule  Body aches  -rapid flu test positive for influenza B  F/u prn  Grier Mitts, MD

## 2018-05-06 NOTE — Telephone Encounter (Signed)
That's fine

## 2018-05-06 NOTE — Telephone Encounter (Signed)
Copied from [place] [phone]. Topic: General - Other >> May 06, 2018  3:26 PM Cecelia Byars, NT wrote: Reason for CRM:  Patient called and states she needs a note for work , she has been out of work since last Tuesday  for 04/29/18 thru 05/05/18 , please advise  [phone]

## 2018-05-08 NOTE — Telephone Encounter (Signed)
Pt came by the clinic and picked up the note

## 2018-05-16 ENCOUNTER — Encounter: Payer: BC Managed Care – PPO | Admitting: Internal Medicine

## 2018-05-16 NOTE — Progress Notes (Signed)
Chief Complaint  Patient presents with  . Annual Exam    HPI: Patient  [name] [name]  62 y.o. comes in today for Preventive Health Care visit  No major changes in health  Had colon neg  father had colon cancer  So on q 5 year  Had influenza B  Last month  Health Maintenance  Topic Date Due  . INFLUENZA VACCINE  12/19/2017  . MAMMOGRAM  03/12/2020  . PAP SMEAR-Modifier  11/29/2020  . COLONOSCOPY  03/29/2023  . TETANUS/TDAP  05/14/2023  . Hepatitis C Screening  Completed  . HIV Screening  Completed   Health Maintenance Review LIFESTYLE:  Exercise:   20 - 30 min per week.  Tobacco/ETS: no Alcohol:  seldom Sugar beverages:  Diet soda  And tea some 1/2  Sleep: 6-7  Drug use: no HH of  1 no pets  Work: 44     ROS:   Gets sore arms in am some  No numbness or weakness .    noone in day  ? If sleeping funny  GEN/ HEENT: No fever, significant weight changes sweats headaches vision problems hearing changes, CV/ PULM; No chest pain shortness of breath cough, syncope,edema  change in exercise tolerance. GI /GU: No adominal pain, vomiting, change in bowel habits. No blood in the stool. No significant GU symptoms. SKIN/HEME: ,no acute skin rashes suspicious lesions or bleeding. No lymphadenopathy, nodules, masses.  NEURO/ PSYCH:  No neurologic signs such as weakness numbness. No depression anxiety. IMM/ Allergy: No unusual infections.  Allergy .   REST of 12 system review negative except as per HPI   Past Medical History:  Diagnosis Date  . Arthritis   . Depression    stable on medication  . Diverticulitis 2015  . Diverticulosis   . Gallstones   . GERD (gastroesophageal reflux disease)   . Hyperlipidemia    no meds needed  . IBS (irritable bowel syndrome)   . Osteopenia   . PUD (peptic ulcer disease) 1980's  . Renal cyst    4.5-6 cm stable single  seen by uro in past    Past Surgical History:  Procedure Laterality Date  . CHOLECYSTECTOMY  1987  . COLONOSCOPY      . IRRIGATION AND DEBRIDEMENT SEBACEOUS CYST Right 3/ 2014   chest wall  . UPPER GASTROINTESTINAL ENDOSCOPY    . WISDOM TOOTH EXTRACTION  age 32 & 60   lower teeth done first and upper wisdom teeth last    Family History  Problem Relation Age of Onset  . Rheum arthritis Father   . Pneumonia Father        deceased  . Colon cancer Father 32  . Heart attack Father   . Breast cancer Sister 56  . Colon polyps Sister   . Breast cancer Paternal Grandmother 23  . Osteoarthritis Mother   . COPD Mother   . Pneumonia Mother   . Osteoporosis Mother   . Cancer Maternal Grandmother        ? GI  . Heart failure Maternal Grandfather   . Heart failure Paternal Grandfather   . Esophageal cancer Neg Hx   . Rectal cancer Neg Hx   . Stomach cancer Neg Hx     Social History   Socioeconomic History  . Marital status: Divorced    Spouse name: Not on file  . Number of children: 0  . Years of education: Not on file  . Highest education level: Not on file  Occupational History  . Occupation: Product manager: [employer]  . Financial resource strain: Not on file  . Food insecurity:    Worry: Not on file    Inability: Not on file  . Transportation needs:    Medical: Not on file    Non-medical: Not on file  Tobacco Use  . Smoking status: Never Smoker  . Smokeless tobacco: Never Used  Substance and Sexual Activity  . Alcohol use: No    Frequency: Never    Comment: occasionally  . Drug use: No  . Sexual activity: Never    Birth control/protection: Post-menopausal  Lifestyle  . Physical activity:    Days per week: Not on file    Minutes per session: Not on file  . Stress: Not on file  Relationships  . Social connections:    Talks on phone: Not on file    Gets together: Not on file    Attends religious service: Not on file    Active member of club or organization: Not on file    Attends meetings of clubs or organizations: Not on file    Relationship  status: Not on file  Other Topics Concern  . Not on file  Social History Narrative   Teacher media specialist graduate degree Oncologist.   Non smoker    Hh of 1 ... 2 cats    Single   [id number]   Mom is now in assisted living and she has moved into her old residence    Outpatient Medications Prior to Visit  Medication Sig Dispense Refill  . albuterol (PROVENTIL HFA;VENTOLIN HFA) 108 (90 Base) MCG/ACT inhaler Inhale 1-2 puffs into the lungs every 6 (six) hours as needed for wheezing or shortness of breath. 1 Inhaler 0  . Cholecalciferol (VITAMIN D PO) Take 2,000 mg by mouth every other day.     Marland Kitchen FLUoxetine (PROZAC) 20 MG tablet Take 20 mg by mouth daily.    . fluticasone furoate-vilanterol (BREO ELLIPTA) 200-25 MCG/INH AEPB Inhale 1 puff into the lungs daily. 1 each 2  . Multiple Vitamins-Minerals (CENTRUM ULTRA WOMENS PO) Take 1 tablet by mouth every other day.     Marland Kitchen omeprazole (PRILOSEC) 20 MG capsule Take 1 capsule (20 mg total) by mouth daily. 90 capsule 3  . Probiotic Product (PROBIOTIC PO) Take by mouth daily.    . propranolol (INDERAL) 10 MG tablet Take 10 mg by mouth as needed.     Marland Kitchen PEG-KCl-NaCl-NaSulf-Na Asc-C (PLENVU) 140 g SOLR Take 140 g by mouth as directed. 1 each 0   Facility-Administered Medications Prior to Visit  Medication Dose Route Frequency Provider Last Rate Last Dose  . 0.9 %  sodium chloride infusion  500 mL Intravenous Once Danis, Estill Cotta III, MD         EXAM:  BP 130/64 (BP Location: Left Arm, Patient Position: Sitting, Cuff Size: Normal)   Pulse 80   Temp 98.4 F (36.9 C) (Oral)   Ht _0  (1.651 m)   Wt 171 lb 12.8 oz (77.9 kg)   LMP 11/18/2004   SpO2 95%   BMI 28.59 kg/m   Body mass index is 28.59 kg/m. Wt Readings from Last 3 Encounters:  05/19/18 171 lb 12.8 oz (77.9 kg)  04/29/18 174 lb (78.9 kg)  03/28/18 176 lb (79.8 kg)    Physical Exam: Vital signs reviewed [id number] is a well-developed well-nourished alert cooperative     who appearsr stated age  in no acute distress.  HEENT: normocephalic atraumatic , Eyes: PERRL EOM's full, conjunctiva clear, Nares: paten,t no deformity discharge or tenderness., Ears: no deformity EAC's clear TMs with normal landmarks. Mouth: clear OP, no lesions, edema.  Moist mucous membranes. Dentition in adequate repair. NECK: no pian without masses, thyromegaly or bruits. Tends to  Be dec  CHEST/PULM:  Clear to auscultation and percussion breath sounds equal no wheeze , rales or rhonchi. No chest wall deformities or tenderness. Breast: normal by inspection . No dimpling, discharge, masses, tenderness or discharge . CV: PMI is nondisplaced, S1 S2 no gallops, murmurs, rubs. Peripheral pulses are full without delay.No JVD .  ABDOMEN: Bowel sounds normal nontender  No guard or rebound, no hepato splenomegal no CVA tenderness.  No hernia. Extremtities:  No clubbing cyanosis or edema, no acute joint swelling or redness no focal atrophy NEURO:  Oriented x3, cranial nerves 3-12 appear to be intact, no obvious focal weakness,gait within normal limits no abnormal reflexes or asymmetrical SKIN: No acute rashes normal turgor, color, no bruising or petechiae. PSYCH: Oriented, good eye contact, no obvious depression anxiety, cognition and judgment appear normal. LN: no cervical axillary inguinal adenopathy  Lab Results  Component Value Date   WBC 5.9 12/27/2017   HGB 13.2 12/27/2017   HCT 39.1 12/27/2017   PLT 263.0 12/27/2017   GLUCOSE 87 10/10/2016   CHOL 196 05/06/2017   TRIG 86.0 05/06/2017   HDL 70.60 05/06/2017   LDLDIRECT 132.7 03/24/2013   LDLCALC 108 (H) 05/06/2017   ALT 17 10/10/2016   AST 23 10/10/2016   NA 137 10/10/2016   K 3.9 10/10/2016   CL 99 10/10/2016   CREATININE 0.75 10/10/2016   BUN 13 10/10/2016   CO2 29 10/10/2016   TSH 1.29 03/29/2016    BP Readings from Last 3 Encounters:  05/19/18 130/64  04/29/18 98/68  03/28/18 129/72    Lab results reviewed with patient     ASSESSMENT AND PLAN:  Discussed the following assessment and plan:  Visit for preventive health examination - Plan: Basic metabolic panel, Hepatic function panel, Lipid panel, TSH  Medication management - Plan: Basic metabolic panel, Hepatic function panel, Lipid panel, TSH  Elevated LDL cholesterol level - not fasting today  lab ordered  - Plan: Basic metabolic panel, Hepatic function panel, Lipid panel, TSH Decline  Flu vaccine today  Disc shingrix can make appt when ready  Patient Care Team: , Standley Brooking, MD as PCP - Saralyn Pilar, FNP as Nurse Practitioner (Nurse Practitioner) Rolan Bucco, MD as Consulting Physician (Urology) Sheralyn Boatman, MD as Consulting Physician (Psychiatry) Loletha Carrow Kirke Corin, MD as Consulting Physician (Gastroenterology) Alyson Ingles Candee Furbish, MD as Consulting Physician (Urology) Patient Instructions  Consider shingrix when  Convenient.     Preventive Care 40-64 Years, Female Preventive care refers to lifestyle choices and visits with your health care provider that can promote health and wellness. What does preventive care include?   A yearly physical exam. This is also called an annual well check.  Dental exams once or twice a year.  Routine eye exams. Ask your health care provider how often you should have your eyes checked.  Personal lifestyle choices, including: ? Daily care of your teeth and gums. ? Regular physical activity. ? Eating a healthy diet. ? Avoiding tobacco and drug use. ? Limiting alcohol use. ? Practicing safe sex. ? Taking low-dose aspirin daily starting at age 79. ? Taking vitamin and mineral supplements as recommended by your health  care provider. What happens during an annual well check? The services and screenings done by your health care provider during your annual well check will depend on your age, overall health, lifestyle risk factors, and family history of disease. Counseling Your health  care provider may ask you questions about your:  Alcohol use.  Tobacco use.  Drug use.  Emotional well-being.  Home and relationship well-being.  Sexual activity.  Eating habits.  Work and work Statistician.  Method of birth control.  Menstrual cycle.  Pregnancy history. Screening You may have the following tests or measurements:  Height, weight, and BMI.  Blood pressure.  Lipid and cholesterol levels. These may be checked every 5 years, or more frequently if you are over 53 years old.  Skin check.  Lung cancer screening. You may have this screening every year starting at age 71 if you have a 30-pack-year history of smoking and currently smoke or have quit within the past 15 years.  Colorectal cancer screening. All adults should have this screening starting at age 11 and continuing until age 52. Your health care provider may recommend screening at age 30. You will have tests every 1-10 years, depending on your results and the type of screening test. People at increased risk should start screening at an earlier age. Screening tests may include: ? Guaiac-based fecal occult blood testing. ? Fecal immunochemical test (FIT). ? Stool DNA test. ? Virtual colonoscopy. ? Sigmoidoscopy. During this test, a flexible tube with a tiny camera (sigmoidoscope) is used to examine your rectum and lower colon. The sigmoidoscope is inserted through your anus into your rectum and lower colon. ? Colonoscopy. During this test, a long, thin, flexible tube with a tiny camera (colonoscope) is used to examine your entire colon and rectum.  Hepatitis C blood test.  Hepatitis B blood test.  Sexually transmitted disease (STD) testing.  Diabetes screening. This is done by checking your blood sugar (glucose) after you have not eaten for a while (fasting). You may have this done every 1-3 years.  Mammogram. This may be done every 1-2 years. Talk to your health care provider about when you should  start having regular mammograms. This may depend on whether you have a family history of breast cancer.  BRCA-related cancer screening. This may be done if you have a family history of breast, ovarian, tubal, or peritoneal cancers.  Pelvic exam and Pap test. This may be done every 3 years starting at age 56. Starting at age 45, this may be done every 5 years if you have a Pap test in combination with an HPV test.  Bone density scan. This is done to screen for osteoporosis. You may have this scan if you are at high risk for osteoporosis. Discuss your test results, treatment options, and if necessary, the need for more tests with your health care provider. Vaccines Your health care provider may recommend certain vaccines, such as:  Influenza vaccine. This is recommended every year.  Tetanus, diphtheria, and acellular pertussis (Tdap, Td) vaccine. You may need a Td booster every 10 years.  Varicella vaccine. You may need this if you have not been vaccinated.  Zoster vaccine. You may need this after age 86.  Measles, mumps, and rubella (MMR) vaccine. You may need at least one dose of MMR if you were born in 1957 or later. You may also need a second dose.  Pneumococcal 13-valent conjugate (PCV13) vaccine. You may need this if you have certain conditions and were not previously  vaccinated.  Pneumococcal polysaccharide (PPSV23) vaccine. You may need one or two doses if you smoke cigarettes or if you have certain conditions.  Meningococcal vaccine. You may need this if you have certain conditions.  Hepatitis A vaccine. You may need this if you have certain conditions or if you travel or work in places where you may be exposed to hepatitis A.  Hepatitis B vaccine. You may need this if you have certain conditions or if you travel or work in places where you may be exposed to hepatitis B.  Haemophilus influenzae type b (Hib) vaccine. You may need this if you have certain conditions. Talk to your  health care provider about which screenings and vaccines you need and how often you need them. This information is not intended to replace advice given to you by your health care provider. Make sure you discuss any questions you have with your health care provider. Document Released: 06/03/2015 Document Revised: 06/27/2017 Document Reviewed: 03/08/2015 Elsevier Interactive Patient Education  2019 [place] K.  M.D.

## 2018-05-19 ENCOUNTER — Encounter: Payer: Self-pay | Admitting: Internal Medicine

## 2018-05-19 ENCOUNTER — Ambulatory Visit: Payer: BC Managed Care – PPO | Admitting: Internal Medicine

## 2018-05-19 VITALS — BP 130/64 | HR 80 | Temp 98.4°F | Ht 65.0 in | Wt 171.8 lb

## 2018-05-19 DIAGNOSIS — Z79899 Other long term (current) drug therapy: Secondary | ICD-10-CM

## 2018-05-19 DIAGNOSIS — Z0000 Encounter for general adult medical examination without abnormal findings: Secondary | ICD-10-CM

## 2018-05-19 DIAGNOSIS — E7800 Pure hypercholesterolemia, unspecified: Secondary | ICD-10-CM

## 2018-05-19 LAB — LIPID PANEL
Cholesterol: 215 mg/dL — ABNORMAL HIGH (ref 0–200)
HDL: 73.4 mg/dL (ref >39.00)
LDL Cholesterol: 115 mg/dL — ABNORMAL HIGH (ref 0–99)
NonHDL: 141.3
Total CHOL/HDL Ratio: 3
Triglycerides: 130 mg/dL (ref 0.0–149.0)
VLDL: 26 mg/dL (ref 0.0–40.0)

## 2018-05-19 LAB — HEPATIC FUNCTION PANEL
ALT: 15 U/L (ref 0–35)
AST: 17 U/L (ref 0–37)
Albumin: 4.3 g/dL (ref 3.5–5.2)
Alkaline Phosphatase: 120 U/L — ABNORMAL HIGH (ref 39–117)
Bilirubin, Direct: 0.1 mg/dL (ref 0.0–0.3)
Total Bilirubin: 0.6 mg/dL (ref 0.2–1.2)
Total Protein: 6.8 g/dL (ref 6.0–8.3)

## 2018-05-19 LAB — BASIC METABOLIC PANEL
BUN: 13 mg/dL (ref 6–23)
CO2: 32 mEq/L (ref 19–32)
Calcium: 9.6 mg/dL (ref 8.4–10.5)
Chloride: 101 mEq/L (ref 96–112)
Creatinine, Ser: 0.75 mg/dL (ref 0.40–1.20)
GFR: 82.85 mL/min (ref >60.00)
Glucose, Bld: 90 mg/dL (ref 70–99)
Potassium: 4.6 mEq/L (ref 3.5–5.1)
Sodium: 139 mEq/L (ref 135–145)

## 2018-05-19 LAB — TSH: TSH: 1.13 u[IU]/mL (ref 0.35–4.50)

## 2018-05-19 NOTE — Patient Instructions (Addendum)
Consider shingrix when  Convenient.     Preventive Care 40-64 Years, Female Preventive care refers to lifestyle choices and visits with your health care provider that can promote health and wellness. What does preventive care include?   A yearly physical exam. This is also called an annual well check.  Dental exams once or twice a year.  Routine eye exams. Ask your health care provider how often you should have your eyes checked.  Personal lifestyle choices, including: ? Daily care of your teeth and gums. ? Regular physical activity. ? Eating a healthy diet. ? Avoiding tobacco and drug use. ? Limiting alcohol use. ? Practicing safe sex. ? Taking low-dose aspirin daily starting at age 43. ? Taking vitamin and mineral supplements as recommended by your health care provider. What happens during an annual well check? The services and screenings done by your health care provider during your annual well check will depend on your age, overall health, lifestyle risk factors, and family history of disease. Counseling Your health care provider may ask you questions about your:  Alcohol use.  Tobacco use.  Drug use.  Emotional well-being.  Home and relationship well-being.  Sexual activity.  Eating habits.  Work and work Statistician.  Method of birth control.  Menstrual cycle.  Pregnancy history. Screening You may have the following tests or measurements:  Height, weight, and BMI.  Blood pressure.  Lipid and cholesterol levels. These may be checked every 5 years, or more frequently if you are over 50 years old.  Skin check.  Lung cancer screening. You may have this screening every year starting at age 79 if you have a 30-pack-year history of smoking and currently smoke or have quit within the past 15 years.  Colorectal cancer screening. All adults should have this screening starting at age 32 and continuing until age 50. Your health care provider may recommend  screening at age 67. You will have tests every 1-10 years, depending on your results and the type of screening test. People at increased risk should start screening at an earlier age. Screening tests may include: ? Guaiac-based fecal occult blood testing. ? Fecal immunochemical test (FIT). ? Stool DNA test. ? Virtual colonoscopy. ? Sigmoidoscopy. During this test, a flexible tube with a tiny camera (sigmoidoscope) is used to examine your rectum and lower colon. The sigmoidoscope is inserted through your anus into your rectum and lower colon. ? Colonoscopy. During this test, a long, thin, flexible tube with a tiny camera (colonoscope) is used to examine your entire colon and rectum.  Hepatitis C blood test.  Hepatitis B blood test.  Sexually transmitted disease (STD) testing.  Diabetes screening. This is done by checking your blood sugar (glucose) after you have not eaten for a while (fasting). You may have this done every 1-3 years.  Mammogram. This may be done every 1-2 years. Talk to your health care provider about when you should start having regular mammograms. This may depend on whether you have a family history of breast cancer.  BRCA-related cancer screening. This may be done if you have a family history of breast, ovarian, tubal, or peritoneal cancers.  Pelvic exam and Pap test. This may be done every 3 years starting at age 72. Starting at age 57, this may be done every 5 years if you have a Pap test in combination with an HPV test.  Bone density scan. This is done to screen for osteoporosis. You may have this scan if you are at high  risk for osteoporosis. Discuss your test results, treatment options, and if necessary, the need for more tests with your health care provider. Vaccines Your health care provider may recommend certain vaccines, such as:  Influenza vaccine. This is recommended every year.  Tetanus, diphtheria, and acellular pertussis (Tdap, Td) vaccine. You may need a  Td booster every 10 years.  Varicella vaccine. You may need this if you have not been vaccinated.  Zoster vaccine. You may need this after age 32.  Measles, mumps, and rubella (MMR) vaccine. You may need at least one dose of MMR if you were born in 1957 or later. You may also need a second dose.  Pneumococcal 13-valent conjugate (PCV13) vaccine. You may need this if you have certain conditions and were not previously vaccinated.  Pneumococcal polysaccharide (PPSV23) vaccine. You may need one or two doses if you smoke cigarettes or if you have certain conditions.  Meningococcal vaccine. You may need this if you have certain conditions.  Hepatitis A vaccine. You may need this if you have certain conditions or if you travel or work in places where you may be exposed to hepatitis A.  Hepatitis B vaccine. You may need this if you have certain conditions or if you travel or work in places where you may be exposed to hepatitis B.  Haemophilus influenzae type b (Hib) vaccine. You may need this if you have certain conditions. Talk to your health care provider about which screenings and vaccines you need and how often you need them. This information is not intended to replace advice given to you by your health care provider. Make sure you discuss any questions you have with your health care provider. Document Released: 06/03/2015 Document Revised: 06/27/2017 Document Reviewed: 03/08/2015 Elsevier Interactive Patient Education  2019 [employer].

## 2018-05-22 ENCOUNTER — Other Ambulatory Visit: Payer: Self-pay | Admitting: Internal Medicine

## 2018-05-22 DIAGNOSIS — R748 Abnormal levels of other serum enzymes: Secondary | ICD-10-CM

## 2018-06-23 ENCOUNTER — Ambulatory Visit: Payer: BC Managed Care – PPO | Admitting: Internal Medicine

## 2018-06-23 ENCOUNTER — Encounter: Payer: Self-pay | Admitting: Internal Medicine

## 2018-06-23 VITALS — BP 136/72 | HR 85 | Temp 98.4°F | Wt 166.2 lb

## 2018-06-23 DIAGNOSIS — A09 Infectious gastroenteritis and colitis, unspecified: Secondary | ICD-10-CM

## 2018-06-23 DIAGNOSIS — K589 Irritable bowel syndrome without diarrhea: Secondary | ICD-10-CM

## 2018-06-23 NOTE — Patient Instructions (Addendum)
This acts liike a  Acute  Infectious  Gastroenteritis  Usually viral cause and runs its course  But some bacteria  Could do this also .  But still use  Supportive care.    fluids to avoid dehydration Gatorade   And food as tolerated .   Would try . immodium at night for comfort  Expect should be a lot better  By the end of the week  Could falr up your ibs but  That should not be nocturnal sx .    If blood fever not getting better contact us consider stool tests.     Viral Gastroenteritis, Adult  Viral gastroenteritis is also known as the stomach flu. This condition is caused by various viruses. These viruses can be passed from person to person very easily (are very contagious). This condition may affect your stomach, small intestine, and large intestine. It can cause sudden watery diarrhea, fever, and vomiting. Diarrhea and vomiting can make you feel weak and cause you to become dehydrated. You may not be able to keep fluids down. Dehydration can make you tired and thirsty, cause you to have a dry mouth, and decrease how often you urinate. Older adults and people with other diseases or a weak immune system are at higher risk for dehydration. It is important to replace the fluids that you lose from diarrhea and vomiting. If you become severely dehydrated, you may need to get fluids through an IV tube. What are the causes? Gastroenteritis is caused by various viruses, including rotavirus and norovirus. Norovirus is the most common cause in adults. You can get sick by eating food, drinking water, or touching a surface contaminated with one of these viruses. You can also get sick from sharing utensils or other personal items with an infected person. What increases the risk? This condition is more likely to develop in people:  Who have a weak defense system (immune system).  Who live with one or more children who are younger than 81 years old.  Who live in a nursing home.  Who go on cruise  ships. What are the signs or symptoms? Symptoms of this condition start suddenly 1-2 days after exposure to a virus. Symptoms may last a few days or as long as a week. The most common symptoms are watery diarrhea and vomiting. Other symptoms include:  Fever.  Headache.  Fatigue.  Pain in the abdomen.  Chills.  Weakness.  Nausea.  Muscle aches.  Loss of appetite. How is this diagnosed? This condition is diagnosed with a medical history and physical exam. You may also have a stool test to check for viruses or other infections. How is this treated? This condition typically goes away on its own. The focus of treatment is to restore lost fluids (rehydration). Your health care provider may recommend that you take an oral rehydration solution (ORS) to replace important salts and minerals (electrolytes) in your body. Severe cases of this condition may require giving fluids through an IV tube. Treatment may also include medicine to help with your symptoms. Follow these instructions at home:  Follow instructions from your health care provider about how to care for yourself at home. Follow these recommendations as told by your health care provider:  Take an ORS. This is a drink that is sold at pharmacies and retail stores.  Drink clear fluids in small amounts as you are able. Clear fluids include water, ice chips, diluted fruit juice, and low-calorie sports drinks.  Eat bland, easy-to-digest foods  in small amounts as you are able. These foods include bananas, applesauce, rice, lean meats, toast, and crackers.  Avoid fluids that contain a lot of sugar or caffeine, such as energy drinks, sports drinks, and soda.  Avoid alcohol.  Avoid spicy or fatty foods. General instructions  Drink enough fluid to keep your urine clear or pale yellow.  Wash your hands often. If soap and water are not available, use hand sanitizer.  Make sure that all people in your household wash their hands well  and often.  Take over-the-counter and prescription medicines only as told by your health care provider.  Rest at home while you recover.  Watch your condition for any changes.  Take a warm bath to relieve any burning or pain from frequent diarrhea episodes.  Keep all follow-up visits as told by your health care provider. This is important. Contact a health care provider if:  You cannot keep fluids down.  Your symptoms get worse.  You have new symptoms.  You feel light-headed or dizzy.  You have muscle cramps. Get help right away if:  You have chest pain.  You feel extremely weak or you faint.  You see blood in your vomit.  Your vomit looks like coffee grounds.  You have bloody or black stools or stools that look like tar.  You have a severe headache, a stiff neck, or both.  You have a rash.  You have severe pain, cramping, or bloating in your abdomen.  You have trouble breathing or you are breathing very quickly.  Your heart is beating very quickly.  Your skin feels cold and clammy.  You feel confused.  You have pain when you urinate.  You have signs of dehydration, such as: ? Dark urine, very little urine, or no urine. ? Cracked lips. ? Dry mouth. ? Sunken eyes. ? Sleepiness. ? Weakness. This information is not intended to replace advice given to you by your health care provider. Make sure you discuss any questions you have with your health care provider. Document Released: 05/07/2005 Document Revised: 12/20/2016 Document Reviewed: 01/11/2015 Elsevier Interactive Patient Education  2019 [employer].

## 2018-06-23 NOTE — Progress Notes (Signed)
Chief Complaint  Patient presents with  . stomach virus    Pt states friday she had vomiting, diarrhea. Felt better saturday and sunday but sunday night she started hearing sounds from her stomach and then was up every 15 minutes going to the bathroom with diarrhea    HPI: [name] [name] 63 y.o. come in for sda    "Poss stomach bug "  See above  onset with burning stomach   3 days ago    Works in school   Liquid diarrhea at onset . Drinking water   Loud  abd noises and  Odor  flatulence . And now every 15  - 30 minutes  Queasy   Dec appetite  No fever.  Diarrhea .   No blood .   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  Travel. No  Ha baseline  IBS  No meds x probiotics   Has lots of stress could flare her GI tracts  No recent antibiotics  ROS: See pertinent positives and negatives per HPI.  Past Medical History:  Diagnosis Date  . Arthritis   . Depression    stable on medication  . Diverticulitis 2015  . Diverticulosis   . Gallstones   . GERD (gastroesophageal reflux disease)   . Hyperlipidemia    no meds needed  . IBS (irritable bowel syndrome)   . Osteopenia   . PUD (peptic ulcer disease) 1980's  . Renal cyst    4.5-6 cm stable single  seen by uro in past    Family History  Problem Relation Age of Onset  . Rheum arthritis Father   . Pneumonia Father        deceased  . Colon cancer Father 33  . Heart attack Father   . Breast cancer Sister 40  . Colon polyps Sister   . Breast cancer Paternal Grandmother 27  . Osteoarthritis Mother   . COPD Mother   . Pneumonia Mother   . Osteoporosis Mother   . Cancer Maternal Grandmother        ? GI  . Heart failure Maternal Grandfather   . Heart failure Paternal Grandfather   . Esophageal cancer Neg Hx   . Rectal cancer Neg Hx   . Stomach cancer Neg Hx     Social History   Socioeconomic History  . Marital status: Divorced    Spouse name: Not on file  . Number of children: 0  . Years  of education: Not on file  . Highest education level: Not on file  Occupational History  . Occupation: Product manager: [employer]  . Financial resource strain: Not on file  . Food insecurity:    Worry: Not on file    Inability: Not on file  . Transportation needs:    Medical: Not on file    Non-medical: Not on file  Tobacco Use  . Smoking status: Never Smoker  . Smokeless tobacco: Never Used  Substance and Sexual Activity  . Alcohol use: No    Frequency: Never    Comment: occasionally  . Drug use: No  . Sexual activity: Never    Birth control/protection: Post-menopausal  Lifestyle  . Physical activity:    Days per week: Not on file    Minutes per session: Not on file  . Stress: Not on file  Relationships  . Social connections:    Talks on phone: Not on file  Gets together: Not on file    Attends religious service: Not on file    Active member of club or organization: Not on file    Attends meetings of clubs or organizations: Not on file    Relationship status: Not on file  Other Topics Concern  . Not on file  Social History Narrative   Teacher media specialist graduate degree Oncologist.   Non smoker    Hh of 1 ... 2 cats    Single   G0P0   Mom is now in assisted living and she has moved into her old residence    Outpatient Medications Prior to Visit  Medication Sig Dispense Refill  . albuterol (PROVENTIL HFA;VENTOLIN HFA) 108 (90 Base) MCG/ACT inhaler Inhale 1-2 puffs into the lungs every 6 (six) hours as needed for wheezing or shortness of breath. 1 Inhaler 0  . Cholecalciferol (VITAMIN D PO) Take 2,000 mg by mouth every other day.     Marland Kitchen FLUoxetine (PROZAC) 20 MG tablet Take 20 mg by mouth daily.    . fluticasone furoate-vilanterol (BREO ELLIPTA) 200-25 MCG/INH AEPB Inhale 1 puff into the lungs daily. 1 each 2  . Multiple Vitamins-Minerals (CENTRUM ULTRA WOMENS PO) Take 1 tablet by mouth every other day.     Marland Kitchen omeprazole  (PRILOSEC) 20 MG capsule Take 1 capsule (20 mg total) by mouth daily. 90 capsule 3  . Probiotic Product (PROBIOTIC PO) Take by mouth daily.    . propranolol (INDERAL) 10 MG tablet Take 10 mg by mouth as needed.      Facility-Administered Medications Prior to Visit  Medication Dose Route Frequency Provider Last Rate Last Dose  . 0.9 %  sodium chloride infusion  500 mL Intravenous Once Danis, Estill Cotta III, MD         EXAM:  BP 136/72 (BP Location: Right Arm, Patient Position: Sitting, Cuff Size: Normal)   Pulse 85   Temp 98.4 F (36.9 C) (Oral)   Wt 166 lb 3.2 oz (75.4 kg)   LMP 11/18/2004   SpO2 97%   BMI 27.66 kg/m   Body mass index is 27.66 kg/m.  GENERAL: vitals reviewed and listed above, alert, oriented, appears well hydrated and in no acute distress HEENT: atraumatic, conjunctiva  clear, no obvious abnormalities on inspection of external nose and ears  NECK: no obvious masses on inspection palpation  LUNGS: clear to auscultation bilaterally, no wheezes, rales or rhonchi, good air movement CV: HRRR, no clubbing cyanosis or  peripheral edema nl cap refill  Abdomen:  Sof,t normal  To inc bowel sounds without hepatosplenomegaly, no guarding rebound or masses no CVA tenderness MS: moves all extremities without noticeable focal  abnormality PSYCH: pleasant and cooperative, no obvious depression or anxiety Lab Results  Component Value Date   WBC 5.9 12/27/2017   HGB 13.2 12/27/2017   HCT 39.1 12/27/2017   PLT 263.0 12/27/2017   GLUCOSE 90 05/19/2018   CHOL 215 (H) 05/19/2018   TRIG 130.0 05/19/2018   HDL 73.40 05/19/2018   LDLDIRECT 132.7 03/24/2013   LDLCALC 115 (H) 05/19/2018   ALT 15 05/19/2018   AST 17 05/19/2018   NA 139 05/19/2018   K 4.6 05/19/2018   CL 101 05/19/2018   CREATININE 0.75 05/19/2018   BUN 13 05/19/2018   CO2 32 05/19/2018   TSH 1.13 05/19/2018   BP Readings from Last 3 Encounters:  06/23/18 136/72  05/19/18 130/64  04/29/18 98/68     ASSESSMENT AND PLAN:  Discussed  the following assessment and plan:  Acute infective gastroenteritis diarrhea  Irritable bowel syndrome, unspecified type Most likely acute ge  Infectious   No alarm sx   Exposures to children school   .   Expectant management. For alarm sx  comfort support  Care  And fu contact if fever blood   persistent or progressive etc .   -Patient advised to return or notify health care team  if  new concerns arise.  Patient Instructions  This acts liike a  Acute  Infectious  Gastroenteritis  Usually viral cause and runs its course  But some bacteria  Could do this also .  But still use  Supportive care.    fluids to avoid dehydration Gatorade   And food as tolerated .   Would try . immodium at night for comfort  Expect should be a lot better  By the end of the week  Could falr up your ibs but  That should not be nocturnal sx .    If blood fever not getting better contact us consider stool tests.     Viral Gastroenteritis, Adult  Viral gastroenteritis is also known as the stomach flu. This condition is caused by various viruses. These viruses can be passed from person to person very easily (are very contagious). This condition may affect your stomach, small intestine, and large intestine. It can cause sudden watery diarrhea, fever, and vomiting. Diarrhea and vomiting can make you feel weak and cause you to become dehydrated. You may not be able to keep fluids down. Dehydration can make you tired and thirsty, cause you to have a dry mouth, and decrease how often you urinate. Older adults and people with other diseases or a weak immune system are at higher risk for dehydration. It is important to replace the fluids that you lose from diarrhea and vomiting. If you become severely dehydrated, you may need to get fluids through an IV tube. What are the causes? Gastroenteritis is caused by various viruses, including rotavirus and norovirus. Norovirus is the most  common cause in adults. You can get sick by eating food, drinking water, or touching a surface contaminated with one of these viruses. You can also get sick from sharing utensils or other personal items with an infected person. What increases the risk? This condition is more likely to develop in people:  Who have a weak defense system (immune system).  Who live with one or more children who are younger than 75 years old.  Who live in a nursing home.  Who go on cruise ships. What are the signs or symptoms? Symptoms of this condition start suddenly 1-2 days after exposure to a virus. Symptoms may last a few days or as long as a week. The most common symptoms are watery diarrhea and vomiting. Other symptoms include:  Fever.  Headache.  Fatigue.  Pain in the abdomen.  Chills.  Weakness.  Nausea.  Muscle aches.  Loss of appetite. How is this diagnosed? This condition is diagnosed with a medical history and physical exam. You may also have a stool test to check for viruses or other infections. How is this treated? This condition typically goes away on its own. The focus of treatment is to restore lost fluids (rehydration). Your health care provider may recommend that you take an oral rehydration solution (ORS) to replace important salts and minerals (electrolytes) in your body. Severe cases of this condition may require giving fluids through an IV tube. Treatment may also  include medicine to help with your symptoms. Follow these instructions at home:  Follow instructions from your health care provider about how to care for yourself at home. Follow these recommendations as told by your health care provider:  Take an ORS. This is a drink that is sold at pharmacies and retail stores.  Drink clear fluids in small amounts as you are able. Clear fluids include water, ice chips, diluted fruit juice, and low-calorie sports drinks.  Eat bland, easy-to-digest foods in small amounts as  you are able. These foods include bananas, applesauce, rice, lean meats, toast, and crackers.  Avoid fluids that contain a lot of sugar or caffeine, such as energy drinks, sports drinks, and soda.  Avoid alcohol.  Avoid spicy or fatty foods. General instructions  Drink enough fluid to keep your urine clear or pale yellow.  Wash your hands often. If soap and water are not available, use hand sanitizer.  Make sure that all people in your household wash their hands well and often.  Take over-the-counter and prescription medicines only as told by your health care provider.  Rest at home while you recover.  Watch your condition for any changes.  Take a warm bath to relieve any burning or pain from frequent diarrhea episodes.  Keep all follow-up visits as told by your health care provider. This is important. Contact a health care provider if:  You cannot keep fluids down.  Your symptoms get worse.  You have new symptoms.  You feel light-headed or dizzy.  You have muscle cramps. Get help right away if:  You have chest pain.  You feel extremely weak or you faint.  You see blood in your vomit.  Your vomit looks like coffee grounds.  You have bloody or black stools or stools that look like tar.  You have a severe headache, a stiff neck, or both.  You have a rash.  You have severe pain, cramping, or bloating in your abdomen.  You have trouble breathing or you are breathing very quickly.  Your heart is beating very quickly.  Your skin feels cold and clammy.  You feel confused.  You have pain when you urinate.  You have signs of dehydration, such as: ? Dark urine, very little urine, or no urine. ? Cracked lips. ? Dry mouth. ? Sunken eyes. ? Sleepiness. ? Weakness. This information is not intended to replace advice given to you by your health care provider. Make sure you discuss any questions you have with your health care provider. Document Released:  05/07/2005 Document Revised: 12/20/2016 Document Reviewed: 01/11/2015 Elsevier Interactive Patient Education  2019 [employer] K. Libero Puthoff M.D.

## 2018-12-11 NOTE — Progress Notes (Signed)
63 y.o. G0P0000 Divorced White or Caucasian Not Hispanic or Latino female here for annual exam.   She has gained ~7 lbs since covid.  No vaginal bleeding.  No sexually active  Rare, minimal GSI.  No bowel c/o.    Patient's last menstrual period was 11/18/2004.          Sexually active: No.  The current method of family planning is post menopausal status.    Exercising: Yes.    walking Smoker:  no  Health Maintenance: Pap:  11/29/2017 WNL NEG HPV, 01-04-15 negative, HR HPV negative  History of abnormal Pap:  yes MMG:  03/12/2018 Birads 1 negative Colonoscopy:  03/28/2018 diverticulosis  BMD:   03-07-17 osteopenia, Frax 7.6%/0.5%  TDaP:  05-13-13    reports that she has never smoked. She has never used smokeless tobacco. She reports that she does not drink alcohol or use drugs. She works as a Sports coach  Past Medical History:  Diagnosis Date  . Arthritis   . Depression    stable on medication  . Diverticulitis 2015  . Diverticulosis   . Gallstones   . GERD (gastroesophageal reflux disease)   . Hyperlipidemia    no meds needed  . IBS (irritable bowel syndrome)   . Osteopenia   . PUD (peptic ulcer disease) 1980's  . Renal cyst    4.5-6 cm stable single  seen by uro in past    Past Surgical History:  Procedure Laterality Date  . CHOLECYSTECTOMY  1987  . COLONOSCOPY    . IRRIGATION AND DEBRIDEMENT SEBACEOUS CYST Right 3/ 2014   chest wall  . UPPER GASTROINTESTINAL ENDOSCOPY    . WISDOM TOOTH EXTRACTION  age 33 & 47   lower teeth done first and upper wisdom teeth last    Current Outpatient Medications  Medication Sig Dispense Refill  . Cholecalciferol (VITAMIN D PO) Take 2,000 mg by mouth every other day.     Marland Kitchen FLUoxetine (PROZAC) 20 MG tablet Take 20 mg by mouth daily.    . Multiple Vitamins-Minerals (CENTRUM ULTRA WOMENS PO) Take 1 tablet by mouth every other day.     Marland Kitchen omeprazole (PRILOSEC) 20 MG capsule Take 1 capsule (20 mg total) by mouth daily. 90  capsule 3  . Probiotic Product (PROBIOTIC PO) Take by mouth daily.     Current Facility-Administered Medications  Medication Dose Route Frequency Provider Last Rate Last Dose  . 0.9 %  sodium chloride infusion  500 mL Intravenous Once Doran Stabler, MD        Family History  Problem Relation Age of Onset  . Rheum arthritis Father   . Pneumonia Father        deceased  . Colon cancer Father 67  . Heart attack Father   . Breast cancer Sister 21  . Colon polyps Sister   . Breast cancer Paternal Grandmother 63  . Osteoarthritis Mother   . COPD Mother   . Pneumonia Mother   . Osteoporosis Mother   . Cancer Maternal Grandmother        ? GI  . Heart failure Maternal Grandfather   . Heart failure Paternal Grandfather   . Esophageal cancer Neg Hx   . Rectal cancer Neg Hx   . Stomach cancer Neg Hx     Review of Systems  Constitutional: Negative.   HENT: Negative.   Eyes: Negative.   Respiratory: Negative.   Cardiovascular: Negative.   Gastrointestinal: Negative.   Endocrine: Negative.  Genitourinary:       Vaginal dryness  Musculoskeletal: Negative.   Skin: Negative.   Allergic/Immunologic: Negative.   Neurological: Negative.   Hematological: Negative.   Psychiatric/Behavioral: Negative.   She feels dry on the vulva, occasionally irritated or itching. Not currently.   Exam:   BP 128/76 (BP Location: Right Arm, Patient Position: Sitting, Cuff Size: Normal)   Pulse 88   Temp 97.6 F (36.4 C) (Skin)   Ht 5\' 5"  (1.651 m)   Wt 179 lb 3.2 oz (81.3 kg)   LMP 11/18/2004   BMI 29.82 kg/m   Weight change: @WEIGHTCHANGE @ Height:   Height: 5\' 5"  (165.1 cm)  Ht Readings from Last 3 Encounters:  12/16/18 5\' 5"  (1.651 m)  05/19/18 5\' 5"  (1.651 m)  03/28/18 5\' 5"  (1.651 m)    General appearance: alert, cooperative and appears stated age Head: Normocephalic, without obvious abnormality, atraumatic Neck: no adenopathy, supple, symmetrical, trachea midline and thyroid  normal to inspection and palpation Lungs: clear to auscultation bilaterally Cardiovascular: regular rate and rhythm Breasts: normal appearance, no masses or tenderness Abdomen: soft, non-tender; non distended,  no masses,  no organomegaly Extremities: extremities normal, atraumatic, no cyanosis or edema Skin: Skin color, texture, turgor normal. No rashes or lesions Lymph nodes: Cervical, supraclavicular, and axillary nodes normal. No abnormal inguinal nodes palpated Neurologic: Grossly normal   Pelvic: External genitalia:  no lesions, atrophic, no erythema, plaques or fissures.               Urethra:  normal appearing urethra with no masses, tenderness or lesions              Bartholins and Skenes: normal                 Vagina: normal appearing vagina with normal color and discharge, no lesions              Cervix: no lesions               Bimanual Exam:  Uterus:  normal size, contour, position, consistency, mobility, non-tender              Adnexa: no mass, fullness, tenderness               Rectovaginal: Confirms               Anus:  normal sphincter tone, no lesions  Chaperone was present for exam.  A:  Well Woman with normal exam  Intermittent vulvar irritation, helped with vaseline, currently not bothersome. Normal exam  Osteopenia  P:   No pap this year  Labs with primay  Discussed breast self exam  Discussed calcium and vit D intake  DEXA in 10/20   Mammogram in 10/20  Colonoscopy UTD  Vulvar skin care information given

## 2018-12-12 ENCOUNTER — Other Ambulatory Visit: Payer: Self-pay

## 2018-12-16 ENCOUNTER — Ambulatory Visit: Payer: BC Managed Care – PPO | Admitting: Obstetrics and Gynecology

## 2018-12-16 ENCOUNTER — Encounter: Payer: Self-pay | Admitting: Obstetrics and Gynecology

## 2018-12-16 ENCOUNTER — Other Ambulatory Visit: Payer: Self-pay

## 2018-12-16 VITALS — BP 128/76 | HR 88 | Temp 97.6°F | Ht 65.0 in | Wt 179.2 lb

## 2018-12-16 DIAGNOSIS — Z01419 Encounter for gynecological examination (general) (routine) without abnormal findings: Secondary | ICD-10-CM | POA: Diagnosis not present

## 2018-12-16 DIAGNOSIS — M8580 Other specified disorders of bone density and structure, unspecified site: Secondary | ICD-10-CM

## 2018-12-16 NOTE — Patient Instructions (Signed)
EXERCISE AND DIET:  We recommended that you start or continue a regular exercise program for good health. Regular exercise means any activity that makes your heart beat faster and makes you sweat.  We recommend exercising at least 30 minutes per day at least 3 days a week, preferably 4 or 5.  We also recommend a diet low in fat and sugar.  Inactivity, poor dietary choices and obesity can cause diabetes, heart attack, stroke, and kidney damage, among others.    ALCOHOL AND SMOKING:  Women should limit their alcohol intake to no more than 7 drinks/beers/glasses of wine (combined, not each!) per week. Moderation of alcohol intake to this level decreases your risk of breast cancer and liver damage. And of course, no recreational drugs are part of a healthy lifestyle.  And absolutely no smoking or even second hand smoke. Most people know smoking can cause heart and lung diseases, but did you know it also contributes to weakening of your bones? Aging of your skin?  Yellowing of your teeth and nails?  CALCIUM AND VITAMIN D:  Adequate intake of calcium and Vitamin D are recommended.  The recommendations for exact amounts of these supplements seem to change often, but generally speaking 1,200 mg of calcium (between diet and supplement) and 800 units of Vitamin D per day seems prudent. Certain women may benefit from higher intake of Vitamin D.  If you are among these women, your doctor will have told you during your visit.    PAP SMEARS:  Pap smears, to check for cervical cancer or precancers,  have traditionally been done yearly, although recent scientific advances have shown that most women can have pap smears less often.  However, every woman still should have a physical exam from her gynecologist every year. It will include a breast check, inspection of the vulva and vagina to check for abnormal growths or skin changes, a visual exam of the cervix, and then an exam to evaluate the size and shape of the uterus and  ovaries.  And after 64 years of age, a rectal exam is indicated to check for rectal cancers. We will also provide age appropriate advice regarding health maintenance, like when you should have certain vaccines, screening for sexually transmitted diseases, bone density testing, colonoscopy, mammograms, etc.   MAMMOGRAMS:  All women over 40 years old should have a yearly mammogram. Many facilities now offer a "3D" mammogram, which may cost around $50 extra out of pocket. If possible,  we recommend you accept the option to have the 3D mammogram performed.  It both reduces the number of women who will be called back for extra views which then turn out to be normal, and it is better than the routine mammogram at detecting truly abnormal areas.    COLON CANCER SCREENING: Now recommend starting at age 45. At this time colonoscopy is not covered for routine screening until 50. There are take home tests that can be done between 45-49.   COLONOSCOPY:  Colonoscopy to screen for colon cancer is recommended for all women at age 50.  We know, you hate the idea of the prep.  We agree, BUT, having colon cancer and not knowing it is worse!!  Colon cancer so often starts as a polyp that can be seen and removed at colonscopy, which can quite literally save your life!  And if your first colonoscopy is normal and you have no family history of colon cancer, most women don't have to have it again for   10 years.  Once every ten years, you can do something that may end up saving your life, right?  We will be happy to help you get it scheduled when you are ready.  Be sure to check your insurance coverage so you understand how much it will cost.  It may be covered as a preventative service at no cost, but you should check your particular policy.      Breast Self-Awareness Breast self-awareness means being familiar with how your breasts look and feel. It involves checking your breasts regularly and reporting any changes to your  health care provider. Practicing breast self-awareness is important. A change in your breasts can be a sign of a serious medical problem. Being familiar with how your breasts look and feel allows you to find any problems early, when treatment is more likely to be successful. All women should practice breast self-awareness, including women who have had breast implants. How to do a breast self-exam One way to learn what is normal for your breasts and whether your breasts are changing is to do a breast self-exam. To do a breast self-exam: Look for Changes  1. Remove all the clothing above your waist. 2. Stand in front of a mirror in a room with good lighting. 3. Put your hands on your hips. 4. Push your hands firmly downward. 5. Compare your breasts in the mirror. Look for differences between them (asymmetry), such as: ? Differences in shape. ? Differences in size. ? Puckers, dips, and bumps in one breast and not the other. 6. Look at each breast for changes in your skin, such as: ? Redness. ? Scaly areas. 7. Look for changes in your nipples, such as: ? Discharge. ? Bleeding. ? Dimpling. ? Redness. ? A change in position. Feel for Changes Carefully feel your breasts for lumps and changes. It is best to do this while lying on your back on the floor and again while sitting or standing in the shower or tub with soapy water on your skin. Feel each breast in the following way:  Place the arm on the side of the breast you are examining above your head.  Feel your breast with the other hand.  Start in the nipple area and make  inch (2 cm) overlapping circles to feel your breast. Use the pads of your three middle fingers to do this. Apply light pressure, then medium pressure, then firm pressure. The light pressure will allow you to feel the tissue closest to the skin. The medium pressure will allow you to feel the tissue that is a little deeper. The firm pressure will allow you to feel the tissue  close to the ribs.  Continue the overlapping circles, moving downward over the breast until you feel your ribs below your breast.  Move one finger-width toward the center of the body. Continue to use the  inch (2 cm) overlapping circles to feel your breast as you move slowly up toward your collarbone.  Continue the up and down exam using all three pressures until you reach your armpit.  Write Down What You Find  Write down what is normal for each breast and any changes that you find. Keep a written record with breast changes or normal findings for each breast. By writing this information down, you do not need to depend only on memory for size, tenderness, or location. Write down where you are in your menstrual cycle, if you are still menstruating. If you are having trouble noticing differences   in your breasts, do not get discouraged. With time you will become more familiar with the variations in your breasts and more comfortable with the exam. How often should I examine my breasts? Examine your breasts every month. If you are breastfeeding, the best time to examine your breasts is after a feeding or after using a breast pump. If you menstruate, the best time to examine your breasts is 5-7 days after your period is over. During your period, your breasts are lumpier, and it may be more difficult to notice changes. When should I see my health care provider? See your health care provider if you notice:  A change in shape or size of your breasts or nipples.  A change in the skin of your breast or nipples, such as a reddened or scaly area.  Unusual discharge from your nipples.  A lump or thick area that was not there before.  Pain in your breasts.  Anything that concerns you.  

## 2019-01-29 ENCOUNTER — Other Ambulatory Visit: Payer: Self-pay | Admitting: Obstetrics and Gynecology

## 2019-01-29 DIAGNOSIS — Z1231 Encounter for screening mammogram for malignant neoplasm of breast: Secondary | ICD-10-CM

## 2019-02-16 ENCOUNTER — Telehealth: Payer: Self-pay | Admitting: Gastroenterology

## 2019-02-16 MED ORDER — OMEPRAZOLE 20 MG PO CPDR: 20.0000 mg | DELAYED_RELEASE_CAPSULE | Freq: Every day | ORAL | 1 refills | Status: DC

## 2019-02-16 NOTE — Telephone Encounter (Signed)
30 days supply with 1 refill given due to hold until scheduled follow up.

## 2019-03-14 ENCOUNTER — Ambulatory Visit: Payer: BC Managed Care – PPO

## 2019-03-18 ENCOUNTER — Ambulatory Visit (HOSPITAL_BASED_OUTPATIENT_CLINIC_OR_DEPARTMENT_OTHER)
Admission: RE | Admit: 2019-03-18 | Discharge: 2019-03-18 | Disposition: A | Discharge status: Home / Self Care | Payer: BC Managed Care – PPO | Source: Ambulatory Visit | Attending: Obstetrics and Gynecology | Admitting: Obstetrics and Gynecology

## 2019-03-18 ENCOUNTER — Other Ambulatory Visit: Payer: Self-pay

## 2019-03-18 DIAGNOSIS — M8580 Other specified disorders of bone density and structure, unspecified site: Secondary | ICD-10-CM | POA: Insufficient documentation

## 2019-03-18 DIAGNOSIS — Z1231 Encounter for screening mammogram for malignant neoplasm of breast: Secondary | ICD-10-CM | POA: Diagnosis present

## 2019-03-19 ENCOUNTER — Ambulatory Visit (INDEPENDENT_AMBULATORY_CARE_PROVIDER_SITE_OTHER): Payer: BC Managed Care – PPO

## 2019-03-19 ENCOUNTER — Other Ambulatory Visit: Payer: Self-pay

## 2019-03-19 DIAGNOSIS — Z23 Encounter for immunization: Secondary | ICD-10-CM

## 2019-03-23 ENCOUNTER — Encounter: Payer: Self-pay | Admitting: Gastroenterology

## 2019-03-23 ENCOUNTER — Other Ambulatory Visit: Payer: Self-pay

## 2019-03-23 ENCOUNTER — Ambulatory Visit: Payer: BC Managed Care – PPO | Admitting: Gastroenterology

## 2019-03-23 VITALS — BP 120/70 | HR 82 | Temp 98.6°F | Ht 65.0 in | Wt 185.0 lb

## 2019-03-23 DIAGNOSIS — K219 Gastro-esophageal reflux disease without esophagitis: Secondary | ICD-10-CM | POA: Diagnosis not present

## 2019-03-23 MED ORDER — OMEPRAZOLE 20 MG PO CPDR: 20.0000 mg | DELAYED_RELEASE_CAPSULE | Freq: Every day | ORAL | 3 refills | Status: DC

## 2019-03-23 NOTE — Progress Notes (Signed)
     [facility] Progress Note  Chief Complaint: GERD  Subjective  History:  [name] [name] has longstanding GERD symptoms, controlled on once daily omeprazole.  I have seen her for the last few years, she has previously not wanted to de-escalate acid suppression therapy because it works well for her.  Family history of colon cancer in father-no polyps on last colonoscopy November 2019.  A few months back, she had about 2 weeks of some upper abdominal bloating and rumbling that since resolved.  She denies nausea, vomiting, dysphagia, early satiety or weight loss.  Bowel habits regular without rectal bleeding. 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  ROS: Cardiovascular:  no chest pain Respiratory: no dyspnea  The patient's Past Medical, Family and Social History were reviewed and are on file in the EMR.  Objective:  Med list reviewed  Current Outpatient Medications:  .  Cholecalciferol (VITAMIN D PO), Take 2,000 mg by mouth every other day. , Disp: , Rfl:  .  FLUoxetine (PROZAC) 20 MG tablet, Take 20 mg by mouth daily., Disp: , Rfl:  .  Multiple Vitamins-Minerals (CENTRUM ULTRA WOMENS PO), Take 1 tablet by mouth every other day. , Disp: , Rfl:  .  omeprazole (PRILOSEC) 20 MG capsule, Take 1 capsule (20 mg total) by mouth daily., Disp: 90 capsule, Rfl: 3 .  Probiotic Product (PROBIOTIC PO), Take by mouth daily., Disp: , Rfl:   Current Facility-Administered Medications:  .  0.9 %  sodium chloride infusion, 500 mL, Intravenous, Once, Danis, Estill Cotta III, MD   Vital signs in last 24 hrs: Vitals:   03/23/19 1528  BP: 120/70  Pulse: 82  Temp: 98.6 F (37 C)    Physical Exam   Cardiac: RRR without murmurs, S1S2 heard, no peripheral edema  Pulm: clear to auscultation bilaterally, normal RR and effort noted  Abdomen: soft, no tenderness, with active bowel sounds. No guarding or palpable hepatosplenomegaly.  Skin; warm and dry, no jaundice or rash   @ASSESSMENTPLANBEGIN @ Assessment: Encounter Diagnosis  Name Primary?  . Gastroesophageal reflux disease without esophagitis Yes    Stable on chronic therapy.  Again reviewed diet and lifestyle measures to control reflux. Suggested decreasing medicine to every other day to see if she can still have good symptom control with less medicine.  Reviewed the possibility of impaired calcium absorption and perhaps increased risk of respiratory infection from chronic PPI use.  Plan: See me in a year or sooner as needed   Total time 15 minutes, over half spent face-to-face with patient in counseling and coordination of care.   Nelida Meuse III

## 2019-03-23 NOTE — Patient Instructions (Signed)
If you are age 64 or older, your body mass index should be between 23-30. Your Body mass index is 30.79 kg/m. If this is out of the aforementioned range listed, please consider follow up with your Primary Care Provider.  If you are age 2 or younger, your body mass index should be between 19-25. Your Body mass index is 30.79 kg/m. If this is out of the aformentioned range listed, please consider follow up with your Primary Care Provider.   It was a pleasure to see you today!  Dr. Loletha Carrow

## 2019-05-25 ENCOUNTER — Ambulatory Visit (INDEPENDENT_AMBULATORY_CARE_PROVIDER_SITE_OTHER): Payer: BC Managed Care – PPO | Admitting: Internal Medicine

## 2019-05-25 ENCOUNTER — Other Ambulatory Visit: Payer: Self-pay

## 2019-05-25 ENCOUNTER — Encounter: Payer: Self-pay | Admitting: Internal Medicine

## 2019-05-25 VITALS — BP 126/72 | HR 82 | Temp 97.6°F | Ht 65.0 in | Wt 181.6 lb

## 2019-05-25 DIAGNOSIS — Z79899 Other long term (current) drug therapy: Secondary | ICD-10-CM

## 2019-05-25 DIAGNOSIS — M8580 Other specified disorders of bone density and structure, unspecified site: Secondary | ICD-10-CM | POA: Diagnosis not present

## 2019-05-25 DIAGNOSIS — Z0000 Encounter for general adult medical examination without abnormal findings: Secondary | ICD-10-CM | POA: Diagnosis not present

## 2019-05-25 DIAGNOSIS — R748 Abnormal levels of other serum enzymes: Secondary | ICD-10-CM

## 2019-05-25 LAB — CBC WITH DIFFERENTIAL/PLATELET
Basophils Absolute: 0 10*3/uL (ref 0.0–0.1)
Basophils Relative: 0.9 % (ref 0.0–3.0)
Eosinophils Absolute: 0.1 10*3/uL (ref 0.0–0.7)
Eosinophils Relative: 1.5 % (ref 0.0–5.0)
HCT: 40 % (ref 36.0–46.0)
Hemoglobin: 13.2 g/dL (ref 12.0–15.0)
Lymphocytes Relative: 27.3 % (ref 12.0–46.0)
Lymphs Abs: 1.4 10*3/uL (ref 0.7–4.0)
MCHC: 33.1 g/dL (ref 30.0–36.0)
MCV: 89.1 fl (ref 78.0–100.0)
Monocytes Absolute: 0.5 10*3/uL (ref 0.1–1.0)
Monocytes Relative: 9.6 % (ref 3.0–12.0)
Neutro Abs: 3.1 10*3/uL (ref 1.4–7.7)
Neutrophils Relative %: 60.7 % (ref 43.0–77.0)
Platelets: 280 10*3/uL (ref 150.0–400.0)
RBC: 4.5 Mil/uL (ref 3.87–5.11)
RDW: 13.7 % (ref 11.5–15.5)
WBC: 5.1 10*3/uL (ref 4.0–10.5)

## 2019-05-25 LAB — HEPATIC FUNCTION PANEL
ALT: 16 U/L (ref 0–35)
AST: 17 U/L (ref 0–37)
Albumin: 4.2 g/dL (ref 3.5–5.2)
Alkaline Phosphatase: 117 U/L (ref 39–117)
Bilirubin, Direct: 0.1 mg/dL (ref 0.0–0.3)
Total Bilirubin: 0.5 mg/dL (ref 0.2–1.2)
Total Protein: 6.9 g/dL (ref 6.0–8.3)

## 2019-05-25 LAB — TSH: TSH: 1.2 u[IU]/mL (ref 0.35–4.50)

## 2019-05-25 LAB — BASIC METABOLIC PANEL
BUN: 16 mg/dL (ref 6–23)
CO2: 28 mEq/L (ref 19–32)
Calcium: 9.4 mg/dL (ref 8.4–10.5)
Chloride: 101 mEq/L (ref 96–112)
Creatinine, Ser: 0.65 mg/dL (ref 0.40–1.20)
GFR: 91.66 mL/min (ref >60.00)
Glucose, Bld: 100 mg/dL — ABNORMAL HIGH (ref 70–99)
Potassium: 4.4 mEq/L (ref 3.5–5.1)
Sodium: 138 mEq/L (ref 135–145)

## 2019-05-25 LAB — LIPID PANEL
Cholesterol: 221 mg/dL — ABNORMAL HIGH (ref 0–200)
HDL: 74.1 mg/dL (ref >39.00)
LDL Cholesterol: 124 mg/dL — ABNORMAL HIGH (ref 0–99)
NonHDL: 146.89
Total CHOL/HDL Ratio: 3
Triglycerides: 115 mg/dL (ref 0.0–149.0)
VLDL: 23 mg/dL (ref 0.0–40.0)

## 2019-05-25 LAB — VITAMIN D 25 HYDROXY (VIT D DEFICIENCY, FRACTURES): VITD: 40.19 ng/mL (ref 30.00–100.00)

## 2019-05-25 LAB — GAMMA GT: GGT: 11 U/L (ref 7–51)

## 2019-05-25 NOTE — Patient Instructions (Signed)
Will notify you  of labs when available.  Continue lifestyle intervention healthy eating and exercise .  Checking vit d level .  If all ok then  Yearly cpx / visit and med check

## 2019-05-25 NOTE — Progress Notes (Signed)
Chief Complaint  Patient presents with  . Annual Exam    Pt has no concerns today     HPI: Patient  [name] [name]  64 y.o. comes in today for Preventive Health Care visit   She just retired vfrom [employer] last month  No concerns Onfliuox and wants to continue PRilosec and daily  Gets every 5 year colon   Ate  At [age] pimento  cheese biscuit     .  Vit d  qod  And then Multi vit .  Ha ddexa  Per gyne  Osteopenia  Hip.   Health Maintenance  Topic Date Due  . PAP SMEAR-Modifier  11/29/2020  . MAMMOGRAM  03/17/2021  . COLONOSCOPY  03/29/2023  . TETANUS/TDAP  05/14/2023  . INFLUENZA VACCINE  Completed  . Hepatitis C Screening  Completed  . HIV Screening  Completed   Health Maintenance Review LIFESTYLE:  Exercise:    Modest.  Tobacco/ETS:  no Alcohol:   ocass  Rare  Sugar beverages:   ocass not much  Sleep:   About 8  Drug use: no HH of   1   Work: just retired . Last month  [employer].    ROS:  GEN/ HEENT: No fever, significant weight changes sweats headaches vision problems hearing changes, CV/ PULM; No chest pain shortness of breath cough, syncope,edema  change in exercise tolerance. GI /GU: No adominal pain, vomiting, change in bowel habits. No blood in the stool. No significant GU symptoms. SKIN/HEME: ,no acute skin rashes suspicious lesions or bleeding. No lymphadenopathy, nodules, masses.  NEURO/ PSYCH:  No neurologic signs such as weakness numbness. No depression anxiety. IMM/ Allergy: No unusual infections.  Allergy .   REST of 12 system review negative except as per HPI   Past Medical History:  Diagnosis Date  . Arthritis   . Depression    stable on medication  . Diverticulitis 2015  . Diverticulitis of colon without hemorrhage 09/28/2013  . Diverticulosis   . Gallstones   . GERD (gastroesophageal reflux disease)   . Hyperlipidemia    no meds needed  . IBS (irritable bowel syndrome)   . Osteopenia   . PUD (peptic ulcer disease) 1980's  . Renal cyst    4.5-6 cm stable single  seen by uro in past    Past Surgical History:  Procedure Laterality Date  . CHOLECYSTECTOMY  1987  . COLONOSCOPY    . IRRIGATION AND DEBRIDEMENT SEBACEOUS CYST Right 3/ 2014   chest wall  . UPPER GASTROINTESTINAL ENDOSCOPY    . WISDOM TOOTH EXTRACTION  age 69 & 59   lower teeth done first and upper wisdom teeth last    Family History  Problem Relation Age of Onset  . Rheum arthritis Father   . Pneumonia Father        deceased  . Colon cancer Father 57  . Heart attack Father   . Breast cancer Sister 101  . Colon polyps Sister   . Breast cancer Paternal Grandmother 24  . Osteoarthritis Mother   . COPD Mother   . Pneumonia Mother   . Osteoporosis Mother   . Cancer Maternal Grandmother        ? GI  . Heart failure Maternal Grandfather   . Heart failure Paternal Grandfather   . Esophageal cancer Neg Hx   . Rectal cancer Neg Hx   . Stomach cancer Neg Hx       Outpatient Medications Prior to Visit  Medication  Sig Dispense Refill  . Cholecalciferol (VITAMIN D PO) Take 2,000 mg by mouth every other day.     Marland Kitchen FLUoxetine (PROZAC) 20 MG tablet Take 20 mg by mouth daily.    . Multiple Vitamins-Minerals (CENTRUM ULTRA WOMENS PO) Take 1 tablet by mouth every other day.     Marland Kitchen omeprazole (PRILOSEC) 20 MG capsule Take 1 capsule (20 mg total) by mouth daily. 90 capsule 3  . Probiotic Product (PROBIOTIC PO) Take by mouth daily.     Facility-Administered Medications Prior to Visit  Medication Dose Route Frequency Provider Last Rate Last Admin  . 0.9 %  sodium chloride infusion  500 mL Intravenous Once Nelida Meuse III, MD         EXAM:  BP 126/72 (BP Location: Right Arm, Patient Position: Sitting, Cuff Size: Normal)   Pulse 82   Temp 97.6 F (36.4 C) (Temporal)   Ht 5' 5" (1.651 m)   Wt 181 lb 9.6 oz (82.4 kg)   LMP 11/18/2004   SpO2 96%   BMI 30.22 kg/m   Body mass index is 30.22 kg/m. Wt Readings from Last 3 Encounters:  05/25/19 181 lb 9.6  oz (82.4 kg)  03/23/19 185 lb (83.9 kg)  12/16/18 179 lb 3.2 oz (81.3 kg)    Physical Exam: Vital signs reviewed NAT:FTDD is a well-developed well-nourished alert cooperative    who appearsr stated age in no acute distress.  HEENT: normocephalic atraumatic , Eyes: PERRL EOM's full, conjunctiva clear, Nares: paten,t no deformity discharge or tenderness., Ears: no deformity EAC's clear TMs with normal landmarks. Mouth: masked  NECK: supple without masses, thyromegaly or bruits. CHEST/PULM:  Clear to auscultation and percussion breath sounds equal no wheeze , rales or rhonchi. No chest wall deformities or tenderness. Breast: normal by inspection . No dimpling, discharge, masses, tenderness or discharge . CV: PMI is nondisplaced, S1 S2 no gallops, murmurs, rubs. Peripheral pulses are full without delay.No JVD .  ABDOMEN: Bowel sounds normal nontender  No guard or rebound, no hepato splenomegal no CVA tenderness.  No hernia. Extremtities:  No clubbing cyanosis or edema, no acute joint swelling or redness no focal atrophy NEURO:  Oriented x3, cranial nerves 3-12 appear to be intact, no obvious focal weakness,gait within normal limits no abnormal reflexes or asymmetrical SKIN: No acute rashes normal turgor, color, no bruising or petechiae. PSYCH: Oriented, good eye contact, no obvious depression anxiety, cognition and judgment appear normal. LN: no cervical axillary inguinal adenopathy  Lab Results  Component Value Date   WBC 5.9 12/27/2017   HGB 13.2 12/27/2017   HCT 39.1 12/27/2017   PLT 263.0 12/27/2017   GLUCOSE 90 05/19/2018   CHOL 215 (H) 05/19/2018   TRIG 130.0 05/19/2018   HDL 73.40 05/19/2018   LDLDIRECT 132.7 03/24/2013   LDLCALC 115 (H) 05/19/2018   ALT 15 05/19/2018   AST 17 05/19/2018   NA 139 05/19/2018   K 4.6 05/19/2018   CL 101 05/19/2018   CREATININE 0.75 05/19/2018   BUN 13 05/19/2018   CO2 32 05/19/2018   TSH 1.13 05/19/2018    BP Readings from Last 3  Encounters:  05/25/19 126/72  03/23/19 120/70  12/16/18 128/76    Labplan   ASSESSMENT AND PLAN:  Discussed the following assessment and plan:    ICD-10-CM   1. Visit for preventive health examination  U20.25 Basic metabolic panel    CBC with Differential    Hepatic function panel    Lipid panel  TSH    Vitamin D 25 hydroxy    Gamma GT  2. Elevated alkaline phosphatase level  F68.1 Basic metabolic panel    CBC with Differential    Hepatic function panel    Lipid panel    TSH    Vitamin D 25 hydroxy    Gamma GT  3. Medication management  E75.170 Basic metabolic panel    CBC with Differential    Hepatic function panel    Lipid panel    TSH    Vitamin D 25 hydroxy    Gamma GT  4. Osteopenia, unspecified location  Y17.49 Basic metabolic panel    CBC with Differential    Hepatic function panel    Lipid panel    TSH    Vitamin D 25 hydroxy    Gamma GT   Stable conditions   No change in meds  Fu alk phos and vit d level  Patient Care Team: Panosh, Standley Brooking, MD as PCP - General Kem Boroughs, FNP as Nurse Practitioner (Nurse Practitioner) Rolan Bucco, MD as Consulting Physician (Urology) Sheralyn Boatman, MD as Consulting Physician (Psychiatry) Loletha Carrow Kirke Corin, MD as Consulting Physician (Gastroenterology) Alyson Ingles Candee Furbish, MD as Consulting Physician (Urology) Patient Instructions  Will notify you  of labs when available.  Continue lifestyle intervention healthy eating and exercise .  Checking vit d level .  If all ok then  Yearly cpx / visit and med check    Standley Brooking. Panosh M.D.

## 2019-07-25 NOTE — Progress Notes (Signed)
This visit occurred during the SARS-CoV-2 public health emergency.  Safety protocols were in place, including screening questions prior to the visit, additional usage of staff PPE, and extensive cleaning of exam room while observing appropriate contact time as indicated for disinfecting solutions.    Chief Complaint  Patient presents with  . Leg Injury    Pt had a fall in dec 2020 and notice a knot on leg    HPI: [name] [name] 64 y.o. come in for  New problem  mildy osre area  frim  Know  Medial left  Lower leg  [place] out of bed a sisters bed to get to go to [facility] noted   Bump around  Months ago  Hurt head and buttocks now healed and did have soreness  On leg but not attended   But recently and noted when putting on  sock  Was sore and  Firm know area     And a bit better  Now  No pain with wallking  Not maneuver  Not sure if had a bruise there but did have  Some pain there at onset.   Has toe nail fungus on right toe any thing to do?  Tiny bumps that itch at times no flea exposures hx of  Eczema as a child covers with bandaid neosporin and helps   ROS: See pertinent positives and negatives per HPI.  Past Medical History:  Diagnosis Date  . Arthritis   . Depression    stable on medication  . Diverticulitis 2015  . Diverticulitis of colon without hemorrhage 09/28/2013  . Diverticulosis   . Gallstones   . GERD (gastroesophageal reflux disease)   . Hyperlipidemia    no meds needed  . IBS (irritable bowel syndrome)   . Osteopenia   . PUD (peptic ulcer disease) 1980's  . Renal cyst    4.5-6 cm stable single  seen by uro in past    Family History  Problem Relation Age of Onset  . Rheum arthritis Father   . Pneumonia Father        deceased  . Colon cancer Father 23  . Heart attack Father   . Breast cancer Sister 1  . Colon polyps Sister   . Breast cancer Paternal Grandmother 25  . Osteoarthritis Mother   . COPD Mother   . Pneumonia Mother   . Osteoporosis Mother    . Cancer Maternal Grandmother        ? GI  . Heart failure Maternal Grandfather   . Heart failure Paternal Grandfather   . Esophageal cancer Neg Hx   . Rectal cancer Neg Hx   . Stomach cancer Neg Hx     Social History   Socioeconomic History  . Marital status: Divorced    Spouse name: Not on file  . Number of children: 0  . Years of education: Not on file  . Highest education level: Not on file  Occupational History  . Occupation: Product manager: Teaching laboratory technician SCHOOL  Tobacco Use  . Smoking status: Never Smoker  . Smokeless tobacco: Never Used  Substance and Sexual Activity  . Alcohol use: No    Comment: occasionally  . Drug use: No  . Sexual activity: Not Currently    Birth control/protection: Post-menopausal  Other Topics Concern  . Not on file  Social History Narrative   Teacher media specialist graduate degree Oncologist.   Non smoker    Hh of 1 .Marland KitchenMarland Kitchen  2 cats    Single   G0P0   Mom is now in assisted living and she has moved into her old residence   Social Determinants of Health   Financial Resource Strain:   . Difficulty of Paying Living Expenses: Not on file  Food Insecurity:   . Worried About Charity fundraiser in the Last Year: Not on file  . Ran Out of Food in the Last Year: Not on file  Transportation Needs:   . Lack of Transportation (Medical): Not on file  . Lack of Transportation (Non-Medical): Not on file  Physical Activity:   . Days of Exercise per Week: Not on file  . Minutes of Exercise per Session: Not on file  Stress:   . Feeling of Stress : Not on file  Social Connections:   . Frequency of Communication with Friends and Family: Not on file  . Frequency of Social Gatherings with Friends and Family: Not on file  . Attends Religious Services: Not on file  . Active Member of Clubs or Organizations: Not on file  . Attends Archivist Meetings: Not on file  . Marital Status: Not on file    Outpatient Medications Prior to  Visit  Medication Sig Dispense Refill  . Cholecalciferol (VITAMIN D PO) Take 2,000 mg by mouth every other day.     Marland Kitchen FLUoxetine (PROZAC) 20 MG tablet Take 20 mg by mouth daily.    . Multiple Vitamins-Minerals (CENTRUM ULTRA WOMENS PO) Take 1 tablet by mouth every other day.     Marland Kitchen omeprazole (PRILOSEC) 20 MG capsule Take 1 capsule (20 mg total) by mouth daily. 90 capsule 3  . Probiotic Product (PROBIOTIC PO) Take by mouth daily.     Facility-Administered Medications Prior to Visit  Medication Dose Route Frequency Provider Last Rate Last Admin  . 0.9 %  sodium chloride infusion  500 mL Intravenous Once Nelida Meuse III, MD         EXAM:  BP 122/60   Pulse 92   Temp 97.7 F (36.5 C) (Other (Comment))   Ht 5\' 5"  (1.651 m)   Wt 182 lb (82.6 kg)   LMP 11/18/2004   SpO2 98%   BMI 30.29 kg/m   Body mass index is 30.29 kg/m.  GENERAL: vitals reviewed and listed above, alert, oriented, appears well hydrated and in no acute distress HEENT: atraumatic, conjunctiva  clear, no obvious abnormalities on inspection of external nose and ears OP : masked  NECK: no obvious masses on inspection palpation  LUNGS: clear to auscultation bilaterally, no wheezes, rales or rhonchi, good air movement CV: HRRR, no clubbing cyanosis or  peripheral edema nl cap refill  MS: moves all extremities left medial ant ot achilles  With  slighly darkened skin  discoloration like post inflammatory  Color  Without elevation and Platea firmness  [place]  irreg  Mild tendernes  But nl rom no joint or bony swelling tenders   Skin timy pin point pink bump left elbow no vesicles  PSYCH: pleasant and cooperative, no obvious depression or anxiety Lab Results  Component Value Date   WBC 5.1 05/25/2019   HGB 13.2 05/25/2019   HCT 40.0 05/25/2019   PLT 280.0 05/25/2019   GLUCOSE 100 (H) 05/25/2019   CHOL 221 (H) 05/25/2019   TRIG 115.0 05/25/2019   HDL 74.10 05/25/2019   LDLDIRECT 132.7 03/24/2013   LDLCALC 124 (H)  05/25/2019   ALT 16 05/25/2019   AST 17 05/25/2019  NA 138 05/25/2019   K 4.4 05/25/2019   CL 101 05/25/2019   CREATININE 0.65 05/25/2019   BUN 16 05/25/2019   CO2 28 05/25/2019   TSH 1.20 05/25/2019   BP Readings from Last 3 Encounters:  07/27/19 122/60  05/25/19 126/72  03/23/19 120/70    ASSESSMENT AND PLAN:  Discussed the following assessment and plan:  Lump of skin of left lower extremity  History of fall  Rash/skin eruption  Onychomycosis - Plan: ciclopirox (PENLAC) 8 % solution Suspect [place]  Trauma residual ? Old  hematoma   No alarm sx  Will follow  No bony changes or  Masses  Or lof  Fu if progressing sx  hcs on rash if occurs for now  Limit irritant skin products  Can try penlac   If covered by insurance   Topical  Disc  covid vaccine    Should get on list  -Patient advised to return or notify health care team  if  new concerns arise.  There are no Patient Instructions on file for this visit.   Standley Brooking. [place] Code M.D.

## 2019-07-27 ENCOUNTER — Encounter: Payer: Self-pay | Admitting: Internal Medicine

## 2019-07-27 ENCOUNTER — Other Ambulatory Visit: Payer: Self-pay

## 2019-07-27 ENCOUNTER — Ambulatory Visit (INDEPENDENT_AMBULATORY_CARE_PROVIDER_SITE_OTHER): Payer: BC Managed Care – PPO | Admitting: Internal Medicine

## 2019-07-27 VITALS — BP 122/60 | HR 92 | Temp 97.7°F | Ht 65.0 in | Wt 182.0 lb

## 2019-07-27 DIAGNOSIS — R21 Rash and other nonspecific skin eruption: Secondary | ICD-10-CM

## 2019-07-27 DIAGNOSIS — B351 Tinea unguium: Secondary | ICD-10-CM

## 2019-07-27 DIAGNOSIS — R2242 Localized swelling, mass and lump, left lower limb: Secondary | ICD-10-CM | POA: Diagnosis not present

## 2019-07-27 DIAGNOSIS — Z9181 History of falling: Secondary | ICD-10-CM

## 2019-07-27 MED ORDER — CICLOPIROX 8 % EX SOLN: Freq: Every day | CUTANEOUS | 1 refills | Status: DC

## 2019-08-03 ENCOUNTER — Other Ambulatory Visit: Payer: Self-pay | Admitting: Urology

## 2019-08-03 DIAGNOSIS — D4102 Neoplasm of uncertain behavior of left kidney: Secondary | ICD-10-CM

## 2019-08-03 DIAGNOSIS — D49512 Neoplasm of unspecified behavior of left kidney: Secondary | ICD-10-CM

## 2019-08-22 ENCOUNTER — Ambulatory Visit: Payer: BC Managed Care – PPO | Attending: Internal Medicine

## 2019-08-22 DIAGNOSIS — Z23 Encounter for immunization: Secondary | ICD-10-CM

## 2019-08-22 NOTE — Progress Notes (Signed)
   Covid-19 Vaccination Clinic  Name:  [name] [name]    MRN: [id number] DOB: 1956-03-13  08/22/2019  [name] was observed post Covid-19 immunization for 15 minutes without incident. She was provided with Vaccine Information Sheet and instruction to access the V-Safe system.   [name] was instructed to call 911 with any severe reactions post vaccine: Marland Kitchen Difficulty breathing  . Swelling of face and throat  . A fast heartbeat  . A bad rash all over body  . Dizziness and weakness   Immunizations Administered    Name Date Dose VIS Date Route   Pfizer COVID-19 Vaccine 08/22/2019 11:39 AM 0.3 mL 05/01/2019 Intramuscular   Manufacturer: [place]   Lot: [id number]   [place]: [id number]

## 2019-08-28 ENCOUNTER — Other Ambulatory Visit: Payer: Self-pay

## 2019-08-28 ENCOUNTER — Ambulatory Visit
Admission: RE | Admit: 2019-08-28 | Discharge: 2019-08-28 | Disposition: A | Discharge status: Home / Self Care | Payer: BC Managed Care – PPO | Source: Ambulatory Visit | Attending: Urology | Admitting: Urology

## 2019-08-28 DIAGNOSIS — D4102 Neoplasm of uncertain behavior of left kidney: Secondary | ICD-10-CM

## 2019-08-28 DIAGNOSIS — D49512 Neoplasm of unspecified behavior of left kidney: Secondary | ICD-10-CM

## 2019-08-28 MED ORDER — GADOBENATE DIMEGLUMINE 529 MG/ML IV SOLN
16.0000 mL | Freq: Once | INTRAVENOUS | Status: AC | PRN
  Administered 2019-08-28: 16:00:00 16 mL via INTRAVENOUS

## 2019-09-08 ENCOUNTER — Other Ambulatory Visit: Payer: Self-pay

## 2019-09-08 NOTE — Progress Notes (Signed)
This visit occurred during the SARS-CoV-2 public health emergency.  Safety protocols were in place, including screening questions prior to the visit, additional usage of staff PPE, and extensive cleaning of exam room while observing appropriate contact time as indicated for disinfecting solutions.    Chief Complaint  Patient presents with  . Toe Pain    Left big toe pain outer side, started Sunday    HPI: [name] [name] 64 y.o. come in for new  Pain left medial great toe pain shooting ?  No trauma may have recently worn tight shoes  No joint swelling but has a bunion on that foot  . No trauma   Feels better today than yesterday No new sx  ROS: See pertinent positives and negatives per HPI.  Past Medical History:  Diagnosis Date  . Arthritis   . Depression    stable on medication  . Diverticulitis 2015  . Diverticulitis of colon without hemorrhage 09/28/2013  . Diverticulosis   . Gallstones   . GERD (gastroesophageal reflux disease)   . Hyperlipidemia    no meds needed  . IBS (irritable bowel syndrome)   . Osteopenia   . PUD (peptic ulcer disease) 1980's  . Renal cyst    4.5-6 cm stable single  seen by uro in past    Family History  Problem Relation Age of Onset  . Rheum arthritis Father   . Pneumonia Father        deceased  . Colon cancer Father 36  . Heart attack Father   . Breast cancer Sister 22  . Colon polyps Sister   . Breast cancer Paternal Grandmother 70  . Osteoarthritis Mother   . COPD Mother   . Pneumonia Mother   . Osteoporosis Mother   . Cancer Maternal Grandmother        ? GI  . Heart failure Maternal Grandfather   . Heart failure Paternal Grandfather   . Esophageal cancer Neg Hx   . Rectal cancer Neg Hx   . Stomach cancer Neg Hx     Social History   Socioeconomic History  . Marital status: Divorced    Spouse name: Not on file  . Number of children: 0  . Years of education: Not on file  . Highest education level: Not on file    Occupational History  . Occupation: Product manager: Teaching laboratory technician SCHOOL  Tobacco Use  . Smoking status: Never Smoker  . Smokeless tobacco: Never Used  Substance and Sexual Activity  . Alcohol use: No    Comment: occasionally  . Drug use: No  . Sexual activity: Not Currently    Birth control/protection: Post-menopausal  Other Topics Concern  . Not on file  Social History Narrative   Teacher media specialist graduate degree Oncologist.   Non smoker    Hh of 1 ... 2 cats    Single   G0P0   Mom is now in assisted living and she has moved into her old residence   Social Determinants of Radio broadcast assistant Strain:   . Difficulty of Paying Living Expenses:   Food Insecurity:   . Worried About Charity fundraiser in the Last Year:   . Arboriculturist in the Last Year:   Transportation Needs:   . Film/video editor (Medical):   Marland Kitchen Lack of Transportation (Non-Medical):   Physical Activity:   . Days of Exercise per Week:   . Minutes of Exercise  per Session:   Stress:   . Feeling of Stress :   Social Connections:   . Frequency of Communication with Friends and Family:   . Frequency of Social Gatherings with Friends and Family:   . Attends Religious Services:   . Active Member of Clubs or Organizations:   . Attends Archivist Meetings:   Marland Kitchen Marital Status:     Outpatient Medications Prior to Visit  Medication Sig Dispense Refill  . Cholecalciferol (VITAMIN D PO) Take 2,000 mg by mouth every other day.     . ciclopirox (PENLAC) 8 % solution Apply topically at bedtime. Apply over nail and surrounding skin. Apply daily over previous coat. After seven (7) days, may remove with alcohol and continue cycle. 6.6 mL 1  . FLUoxetine (PROZAC) 40 MG capsule Take 40 mg by mouth daily.    . Multiple Vitamins-Minerals (CENTRUM ULTRA WOMENS PO) Take 1 tablet by mouth every other day.     Marland Kitchen omeprazole (PRILOSEC) 20 MG capsule Take 1 capsule (20 mg total) by  mouth daily. 90 capsule 3  . Probiotic Product (PROBIOTIC PO) Take by mouth daily.    Marland Kitchen FLUoxetine (PROZAC) 20 MG tablet Take 20 mg by mouth daily.     Facility-Administered Medications Prior to Visit  Medication Dose Route Frequency Provider Last Rate Last Admin  . 0.9 %  sodium chloride infusion  500 mL Intravenous Once Nelida Meuse III, MD         EXAM:  BP 120/72   Pulse 87   Temp 98.2 F (36.8 C) (Temporal)   Ht 5\' 5"  (1.651 m)   Wt 182 lb 6.4 oz (82.7 kg)   LMP 11/18/2004   SpO2 96%   BMI 30.35 kg/m   Body mass index is 30.35 kg/m.  GENERAL: vitals reviewed and listed above, alert, oriented, appears well hydrated and in no acute distress HEENT: atraumatic, conjunctiva  clear, no obvious abnormalities on inspection of external nose and ears OP :m   NECK: no obvious masses on inspection palpation  MS: moves all extremities left foot  Bunion  And faint erythema at mtp but non tender  Area of concern medical phalanx nasal is clear and no swelling  nv seems ok  Right foot nl and min bunion deformity  PSYCH: pleasant and cooperative, no obvious depression or anxiety  BP Readings from Last 3 Encounters:  09/09/19 120/72  07/27/19 122/60  05/25/19 126/72    ASSESSMENT AND PLAN:  Discussed the following assessment and plan:  Pain of toe of left foot - Plan: DG Toe Great Left, DG Toe Great Left  Bunion of left foot - Plan: DG Toe Great Left, DG Toe Great Left Suspect  Foot mechanics related    Not gout  X ray to day  Counseled shoe support and if  persistent or progressive see podiatry  Can use aleve if needed  -Patient advised to return or notify health care team  if  new concerns arise.  Patient Instructions  This could be  From pressure from the bunion  radiating to toe .  Good fitting shoes wide toe box and if  persistent or progressive consider seeing  Podiatry      Chellie Vanlue K. Aulton Routt M.D.

## 2019-09-09 ENCOUNTER — Ambulatory Visit (INDEPENDENT_AMBULATORY_CARE_PROVIDER_SITE_OTHER): Payer: BC Managed Care – PPO | Admitting: Internal Medicine

## 2019-09-09 ENCOUNTER — Other Ambulatory Visit: Payer: Self-pay

## 2019-09-09 ENCOUNTER — Encounter: Payer: Self-pay | Admitting: Internal Medicine

## 2019-09-09 ENCOUNTER — Ambulatory Visit: Payer: BC Managed Care – PPO | Admitting: Internal Medicine

## 2019-09-09 ENCOUNTER — Ambulatory Visit (INDEPENDENT_AMBULATORY_CARE_PROVIDER_SITE_OTHER): Payer: BC Managed Care – PPO

## 2019-09-09 VITALS — BP 120/72 | HR 87 | Temp 98.2°F | Ht 65.0 in | Wt 182.4 lb

## 2019-09-09 DIAGNOSIS — M79675 Pain in left toe(s): Secondary | ICD-10-CM

## 2019-09-09 DIAGNOSIS — M21612 Bunion of left foot: Secondary | ICD-10-CM | POA: Diagnosis not present

## 2019-09-09 NOTE — Progress Notes (Signed)
Toe shows arthritis  if great toe joint   could cause radiating pain but no alarming findings on x ray

## 2019-09-09 NOTE — Patient Instructions (Signed)
This could be  From pressure from the bunion  radiating to toe .  Good fitting shoes wide toe box and if  persistent or progressive consider seeing  Podiatry

## 2019-09-16 ENCOUNTER — Ambulatory Visit: Payer: BC Managed Care – PPO | Attending: Internal Medicine

## 2019-09-16 DIAGNOSIS — Z23 Encounter for immunization: Secondary | ICD-10-CM

## 2019-09-16 NOTE — Progress Notes (Signed)
   Covid-19 Vaccination Clinic  Name:  [name] [name]    MRN: [id number] DOB: 11-24-1955  09/16/2019  [name] [name] was observed post Covid-19 immunization for 15 minutes without incident. She was provided with Vaccine Information Sheet and instruction to access the V-Safe system.   [name] [name] was instructed to call 911 with any severe reactions post vaccine: Marland Kitchen Difficulty breathing  . Swelling of face and throat  . A fast heartbeat  . A bad rash all over body  . Dizziness and weakness   Immunizations Administered    Name Date Dose VIS Date Route   Pfizer COVID-19 Vaccine 09/16/2019 12:53 PM 0.3 mL 07/15/2018 Intramuscular   Manufacturer: [place]   Lot: [id number]   [place]: [id number]

## 2019-10-29 IMAGING — MG DIGITAL SCREENING BILATERAL MAMMOGRAM WITH TOMO AND CAD
8 series · 9 of 24 positions shown · non-contrast
Comparison: Previous exam(s).

CLINICAL DATA: Screening.

EXAM:
DIGITAL SCREENING BILATERAL MAMMOGRAM WITH TOMO AND CAD

[R MLO synth-2D]
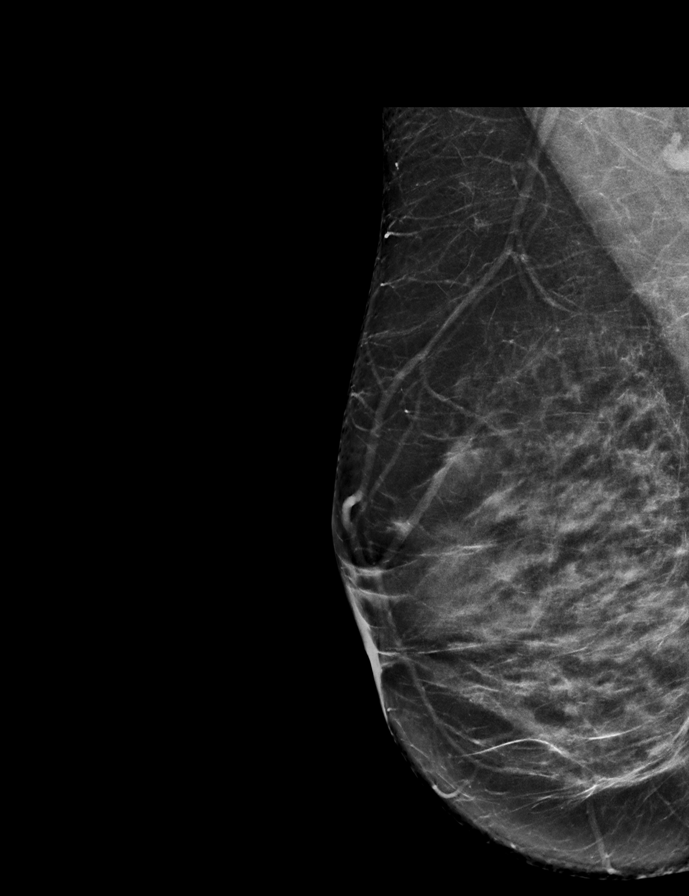

[R CC synth-2D]
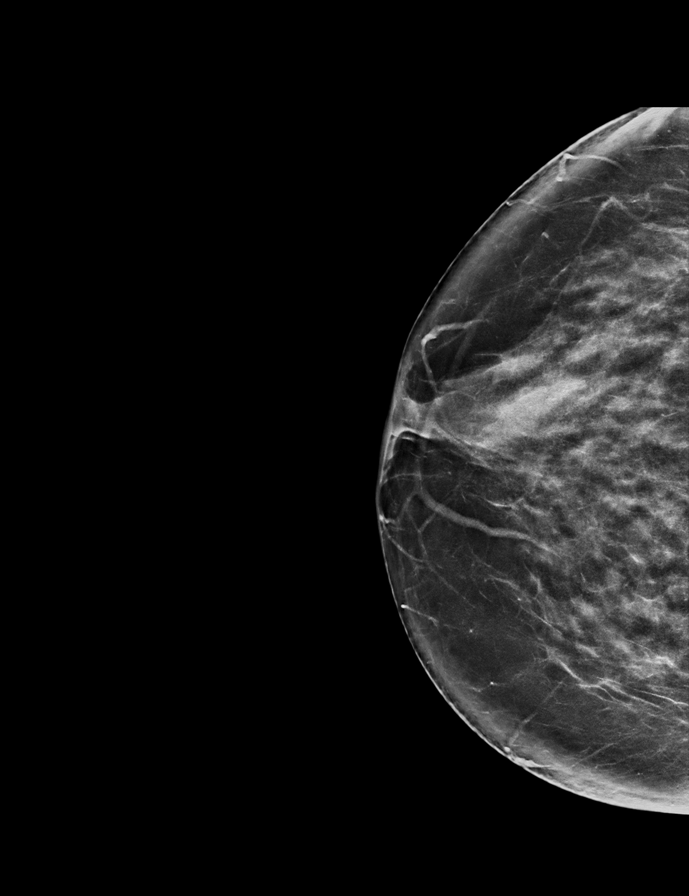

[L CC synth-2D]
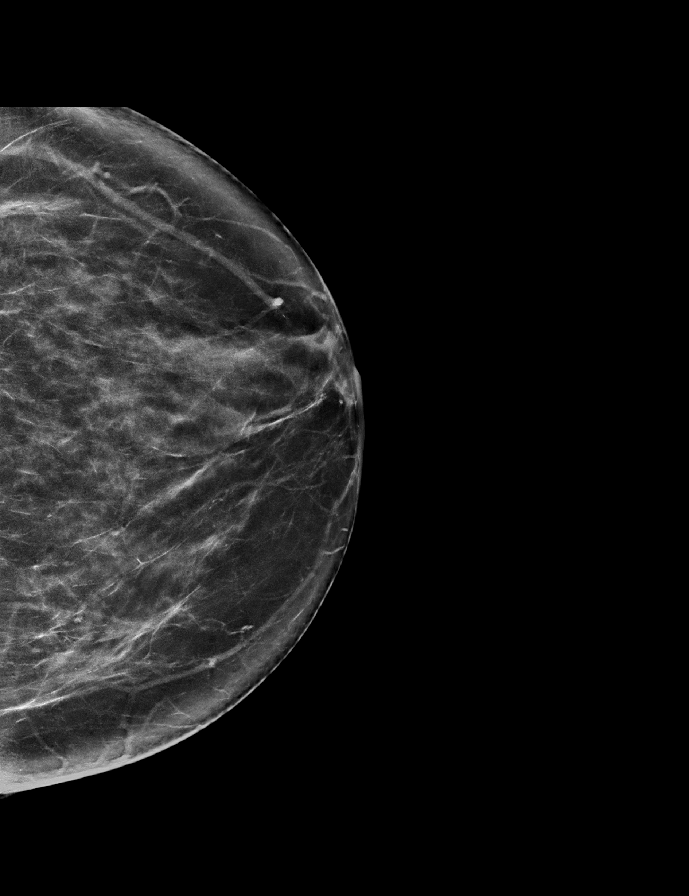

[L MLO synth-2D]
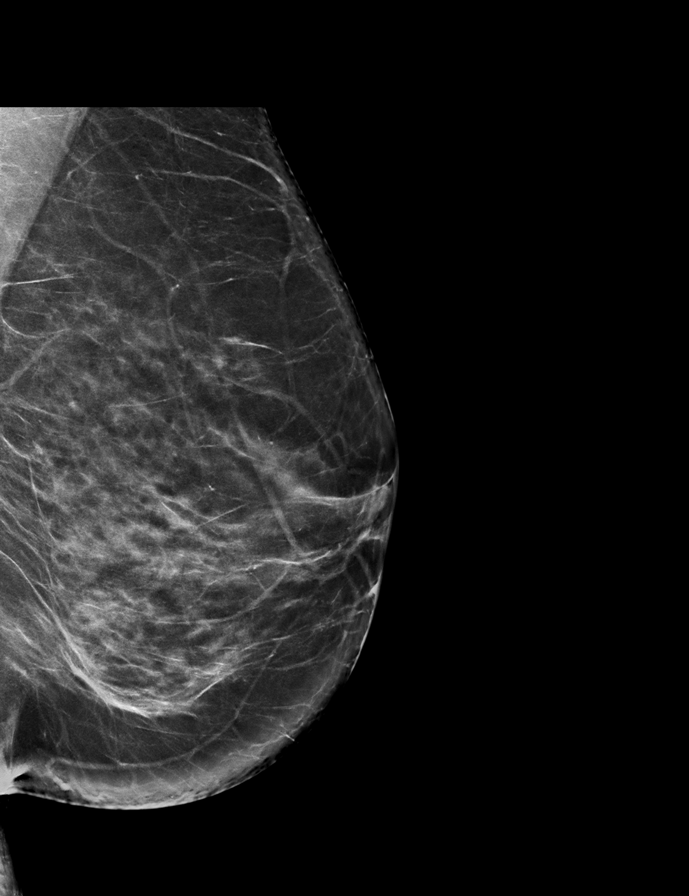

[R MLO tomo · 2 of 77 frames shown]
[frame 25/77]
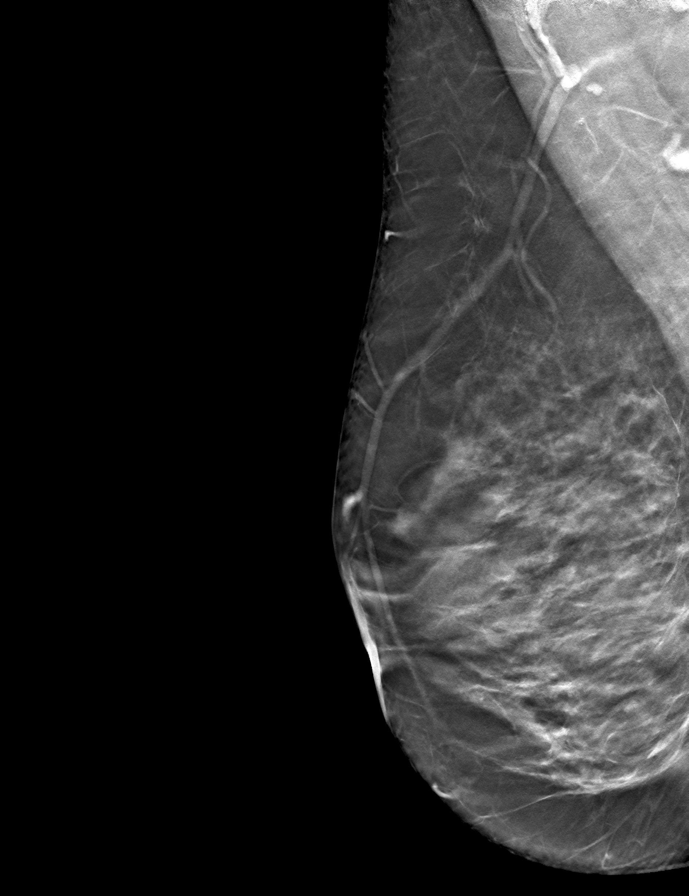
[frame 39/77]
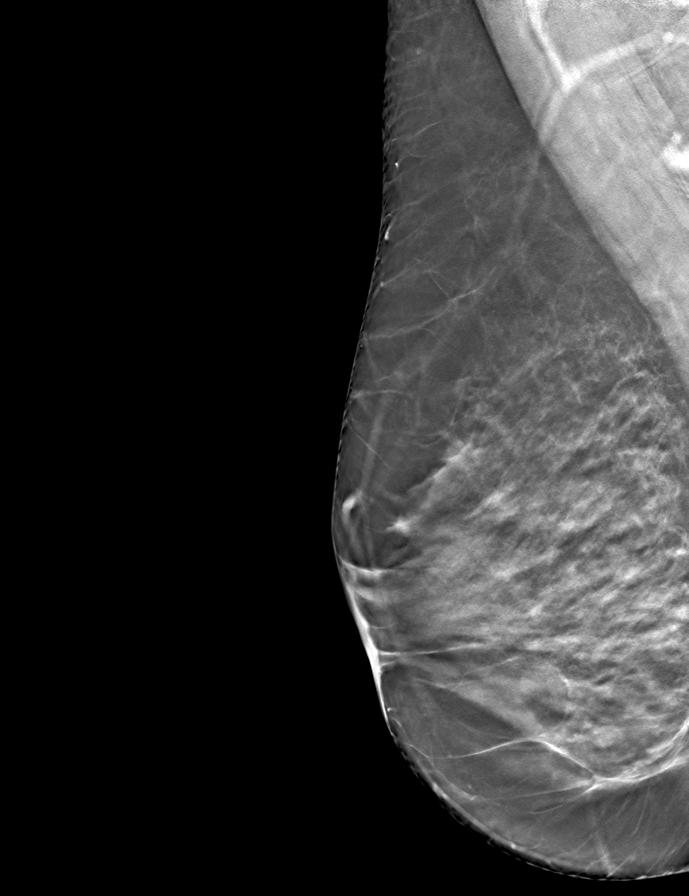

[L MLO tomo · tomo slice 39/78.0]
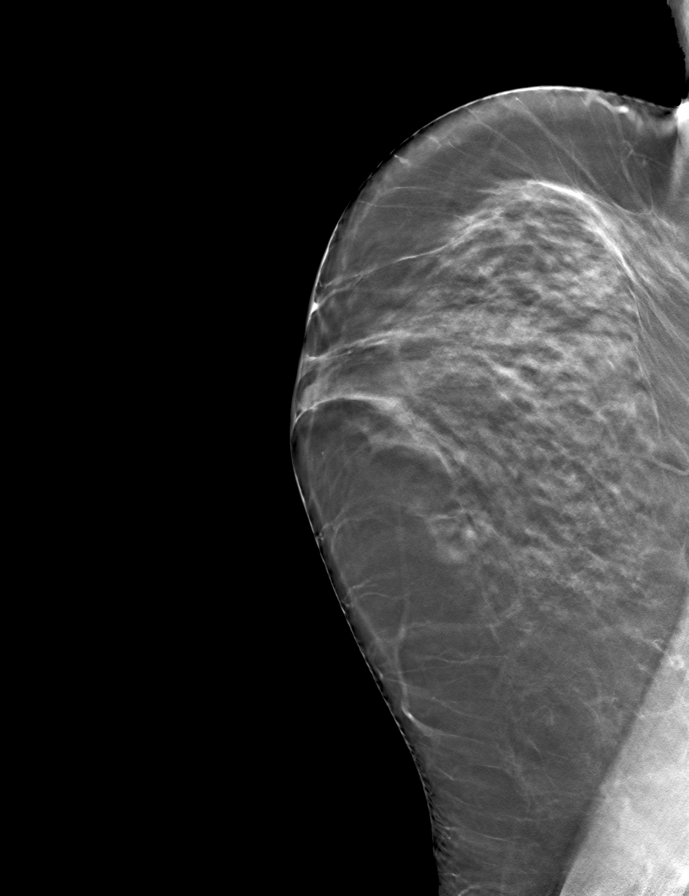

[R CC tomo · tomo slice 37/73.0]
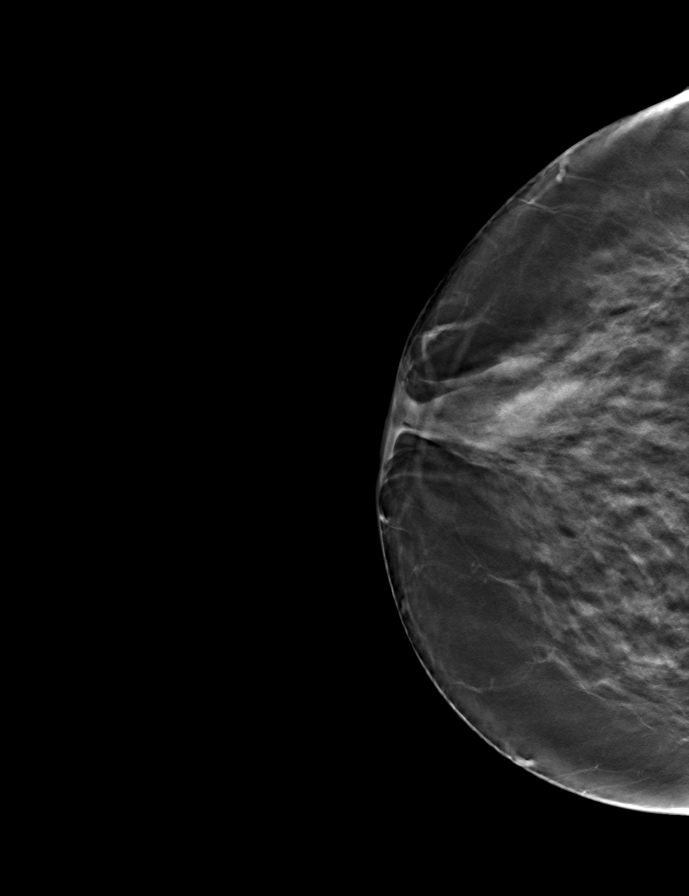

[L CC tomo · tomo slice 39/77.0]
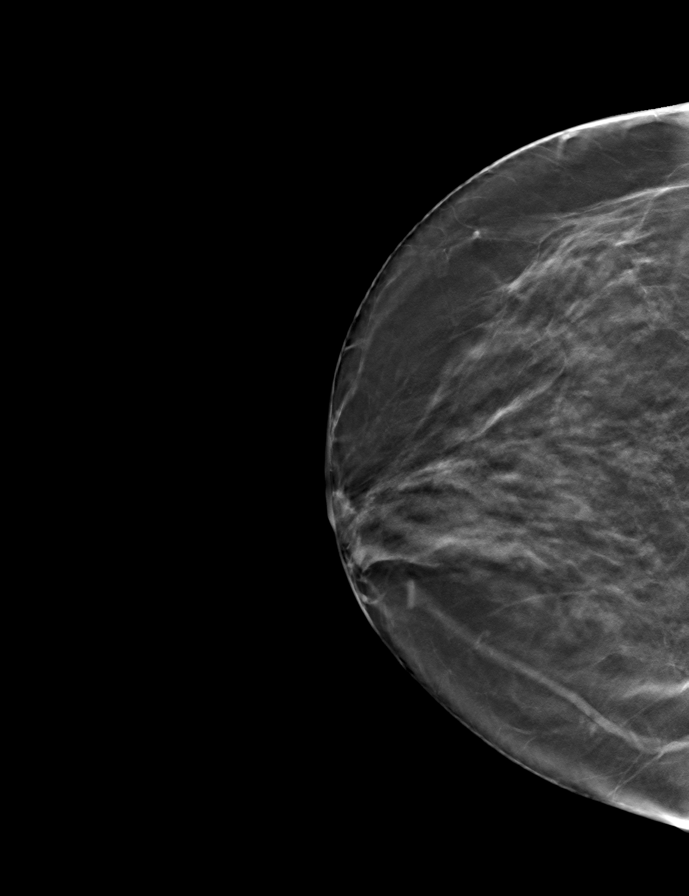

[9 of 24 positions shown; findings below may reference images not displayed]

ACR Breast Density Category c: The breast tissue is heterogeneously
dense, which may obscure small masses.
FINDINGS: There are no findings suspicious for malignancy. Images were
processed with CAD.
IMPRESSION: No mammographic evidence of malignancy. A result letter of this
screening mammogram will be mailed directly to the patient.

RECOMMENDATION:
Screening mammogram in one year. (Code:[id number])

BI-RADS CATEGORY  1: Negative.

## 2020-02-09 ENCOUNTER — Other Ambulatory Visit (HOSPITAL_BASED_OUTPATIENT_CLINIC_OR_DEPARTMENT_OTHER): Payer: Self-pay | Admitting: Obstetrics and Gynecology

## 2020-02-09 DIAGNOSIS — Z1231 Encounter for screening mammogram for malignant neoplasm of breast: Secondary | ICD-10-CM

## 2020-02-26 ENCOUNTER — Telehealth: Payer: Self-pay | Admitting: Internal Medicine

## 2020-02-26 NOTE — Telephone Encounter (Signed)
pt would like to know if she can have the Shingrix? Shingles vaccination if so can she have it in our office

## 2020-03-01 ENCOUNTER — Ambulatory Visit (INDEPENDENT_AMBULATORY_CARE_PROVIDER_SITE_OTHER): Payer: Medicare PPO | Admitting: *Deleted

## 2020-03-01 ENCOUNTER — Other Ambulatory Visit: Payer: Self-pay

## 2020-03-01 DIAGNOSIS — Z23 Encounter for immunization: Secondary | ICD-10-CM | POA: Diagnosis not present

## 2020-03-01 MED ORDER — ZOSTER VAC RECOMB ADJUVANTED 50 MCG/0.5ML IM SUSR: 0.5000 mL | Freq: Once | INTRAMUSCULAR | 1 refills | Status: AC

## 2020-03-05 ENCOUNTER — Ambulatory Visit: Payer: Medicare PPO | Attending: Internal Medicine

## 2020-03-05 DIAGNOSIS — Z23 Encounter for immunization: Secondary | ICD-10-CM

## 2020-03-05 NOTE — Progress Notes (Signed)
   Covid-19 Vaccination Clinic  Name:  [name] [name]    MRN: [id number] DOB: 18-Nov-1955  03/05/2020  [name] [name] was observed post Covid-19 immunization for 15 minutes without incident. She was provided with Vaccine Information Sheet and instruction to access the V-Safe system.   [name] [name] was instructed to call 911 with any severe reactions post vaccine: Marland Kitchen Difficulty breathing  . Swelling of face and throat  . A fast heartbeat  . A bad rash all over body  . Dizziness and weakness

## 2020-03-21 ENCOUNTER — Ambulatory Visit (HOSPITAL_BASED_OUTPATIENT_CLINIC_OR_DEPARTMENT_OTHER): Payer: BC Managed Care – PPO

## 2020-04-25 ENCOUNTER — Ambulatory Visit (HOSPITAL_BASED_OUTPATIENT_CLINIC_OR_DEPARTMENT_OTHER): Payer: Medicare PPO

## 2020-04-27 ENCOUNTER — Ambulatory Visit: Payer: Medicare PPO

## 2020-04-27 ENCOUNTER — Ambulatory Visit (HOSPITAL_BASED_OUTPATIENT_CLINIC_OR_DEPARTMENT_OTHER): Payer: Medicare PPO

## 2020-04-28 ENCOUNTER — Other Ambulatory Visit: Payer: Self-pay | Admitting: Gastroenterology

## 2020-05-04 ENCOUNTER — Ambulatory Visit (INDEPENDENT_AMBULATORY_CARE_PROVIDER_SITE_OTHER): Payer: Medicare PPO

## 2020-05-04 ENCOUNTER — Other Ambulatory Visit: Payer: Self-pay

## 2020-05-04 ENCOUNTER — Encounter: Payer: Self-pay | Admitting: Internal Medicine

## 2020-05-04 DIAGNOSIS — Z1231 Encounter for screening mammogram for malignant neoplasm of breast: Secondary | ICD-10-CM | POA: Diagnosis not present

## 2020-05-18 ENCOUNTER — Other Ambulatory Visit: Payer: Self-pay

## 2020-05-18 ENCOUNTER — Emergency Department (INDEPENDENT_AMBULATORY_CARE_PROVIDER_SITE_OTHER)
Admission: EM | Admit: 2020-05-18 | Discharge: 2020-05-18 | Disposition: A | Discharge status: Home / Self Care | Payer: Medicare PPO | Source: Home / Self Care | Attending: Family Medicine | Admitting: Family Medicine

## 2020-05-18 DIAGNOSIS — R519 Headache, unspecified: Secondary | ICD-10-CM

## 2020-05-18 DIAGNOSIS — R220 Localized swelling, mass and lump, head: Secondary | ICD-10-CM | POA: Diagnosis not present

## 2020-05-18 NOTE — ED Provider Notes (Signed)
[name]    CSN: [id number] Arrival date & time: 05/18/20  1619      History   Chief Complaint Chief Complaint  Patient presents with   Mass    Nodule/cyst    HPI [name] [name] is a 64 y.o. female.   Patient reports some swelling and soreness on the right side of her mandible.  There is no change when she is eating or chewing.  There is no tooth ache or apparent infection in the gum.  HPI  Past Medical History:  Diagnosis Date   Arthritis    Depression    stable on medication   Diverticulitis 2015   Diverticulitis of colon without hemorrhage 09/28/2013   Diverticulosis    Gallstones    GERD (gastroesophageal reflux disease)    Hyperlipidemia    no meds needed   IBS (irritable bowel syndrome)    Osteopenia    PUD (peptic ulcer disease) 1980's   Renal cyst    4.5-6 cm stable single  seen by uro in past    Patient Active Problem List   Diagnosis Date Noted   Elevated alkaline phosphatase measurement 09/26/2014   Renal cyst, left 12/31/2013   Osteopenia 12/31/2013   Unspecified vitamin D deficiency 12/31/2013   Postmenopausal atrophic vaginitis 12/31/2013   Family history of colon cancer 10/02/2013   Wrist pain, right 05/13/2013   Hx of fall 05/13/2013   Hypercholesteremia 05/13/2013   History of kidney stones    Musculoskeletal leg pain 02/02/2013   Gait abnormality 02/02/2013   Chest discomfort 05/28/2011   Left wrist injury 12/01/2010   Renal cyst 12/01/2010   Cough 09/27/2010   Neck pain 08/29/2010   Tingling sensation 08/29/2010   Allergic rhinitis, seasonal 08/29/2010   LEG PAIN, RIGHT 07/01/2009   VARICOSE VEINS LOWER EXTREMITIES W/OTH COMPS 12/06/2008   BACK PAIN, THORACIC REGION 12/06/2008   BACK PAIN 11/10/2008   BRACHIAL NEURITIS OR RADICULITIS NOS 10/29/2008   Irritable bowel syndrome 04/23/2007   HYPERLIPIDEMIA NEC/NOS 01/10/2007   DEPRESSION 01/08/2007   GERD 01/08/2007    Diverticulosis of colon (without mention of hemorrhage) 07/28/2002    Past Surgical History:  Procedure Laterality Date   CHOLECYSTECTOMY  1987   COLONOSCOPY     IRRIGATION AND DEBRIDEMENT SEBACEOUS CYST Right 3/ 2014   chest wall   UPPER GASTROINTESTINAL ENDOSCOPY     WISDOM TOOTH EXTRACTION  age 89 & 10   lower teeth done first and upper wisdom teeth last    OB History    Gravida  0   Para  0   Term  0   Preterm  0   AB  0   Living  0     SAB  0   IAB  0   Ectopic  0   Multiple  0   Live Births  0            Home Medications    Prior to Admission medications   Medication Sig Start Date End Date Taking? Authorizing Provider  Cholecalciferol (VITAMIN D PO) Take 2,000 mg by mouth every other day.     [provider]  ciclopirox (PENLAC) 8 % solution Apply topically at bedtime. Apply over nail and surrounding skin. Apply daily over previous coat. After seven (7) days, may remove with alcohol and continue cycle. 07/27/19   Panosh, Standley Brooking, MD  FLUoxetine (PROZAC) 40 MG capsule Take 40 mg by mouth daily. 07/23/19   [provider]  Multiple Vitamins-Minerals (CENTRUM ULTRA WOMENS PO) Take 1 tablet by mouth every other day.     [provider]  omeprazole (PRILOSEC) 20 MG capsule Take 1 capsule by mouth once daily 04/28/20   Sherrilyn Rist, MD  Probiotic Product (PROBIOTIC PO) Take by mouth daily.    [provider]    Family History Family History  Problem Relation Age of Onset   Rheum arthritis Father    Pneumonia Father        deceased   Colon cancer Father 31   Heart attack Father    Breast cancer Sister 74   Colon polyps Sister    Breast cancer Paternal Grandmother 82   Osteoarthritis Mother    COPD Mother    Pneumonia Mother    Osteoporosis Mother    Cancer Maternal Grandmother        ? GI   Heart failure Maternal Grandfather    Heart failure Paternal Grandfather    Esophageal cancer Neg  Hx    Rectal cancer Neg Hx    Stomach cancer Neg Hx     Social History Social History   Tobacco Use   Smoking status: Never Smoker   Smokeless tobacco: Never Used  Vaping Use   Vaping Use: Never used  Substance Use Topics   Alcohol use: No    Comment: occasionally   Drug use: No     Allergies   Patient has no known allergies.   Review of Systems Review of Systems  All other systems reviewed and are negative.    Physical Exam Triage Vital Signs ED Triage Vitals  Enc Vitals Group     BP 05/18/20 1634 129/71     Pulse Rate 05/18/20 1634 74     Resp 05/18/20 1634 20     Temp 05/18/20 1634 (!) 97.4 F (36.3 C)     Temp Source 05/18/20 1634 Oral     SpO2 05/18/20 1634 96 %     Weight 05/18/20 1632 180 lb (81.6 kg)     Height 05/18/20 1632 5\' 5"  (1.651 m)     Head Circumference --      Peak Flow --      Pain Score 05/18/20 1632 2     Pain Loc --      Pain Edu? --      Excl. in GC? --    No data found.  Updated Vital Signs BP 129/71 (BP Location: Left Arm)    Pulse 74    Temp (!) 97.4 F (36.3 C) (Oral)    Resp 20    Ht 5\' 5"  (1.651 m)    Wt 81.6 kg    LMP 11/18/2004    SpO2 96%    BMI 29.95 kg/m   Visual Acuity Right Eye Distance:   Left Eye Distance:   Bilateral Distance:    Right Eye Near:   Left Eye Near:    Bilateral Near:     Physical Exam Vitals and nursing note reviewed.  Constitutional:      Appearance: Normal appearance.  HENT:     Head: Normocephalic.     Mouth/Throat:     Mouth: Mucous membranes are moist.     Pharynx: Oropharynx is clear.     Comments: There is some soft tissue swelling right lower mandible.  It is nontender. Tooth exam is normal Neck:     Vascular: No carotid bruit.  Musculoskeletal:     Cervical back: No rigidity.  Lymphadenopathy:     Cervical: No cervical adenopathy.  Neurological:     Mental Status: She is alert.      UC Treatments / Results  Labs (all labs ordered are listed, but only  abnormal results are displayed) Labs Reviewed - No data to display  EKG   Radiology No results found.  Procedures Procedures (including critical care time)  Medications Ordered in UC Medications - No data to display  Initial Impression / Assessment and Plan / UC Course  I have reviewed the triage vital signs and the nursing notes.  Pertinent labs & imaging results that were available during my care of the patient were reviewed by me and considered in my medical decision making (see chart for details).   Right facial swelling of unknown etiology.  I do not think this represents infection.  We had ordered mandible x-ray but per suggestion of x-ray tech decided to do CT instead.  That will be done tomorrow.   Final Clinical Impressions(s) / UC Diagnoses   Final diagnoses:  None   Discharge Instructions   None    ED Prescriptions    None     PDMP not reviewed this encounter.   Wardell Honour, MD 05/18/20 [phone]

## 2020-05-18 NOTE — ED Triage Notes (Signed)
Pt presents with nodule/cyst RL mandible with soreness and discomfort. Pt first noted on 12/23. She has not been able to see her dentist.

## 2020-05-19 ENCOUNTER — Other Ambulatory Visit: Payer: Medicare PPO

## 2020-05-23 ENCOUNTER — Ambulatory Visit: Payer: Medicare PPO | Admitting: Internal Medicine

## 2020-05-23 ENCOUNTER — Other Ambulatory Visit: Payer: Self-pay

## 2020-05-23 ENCOUNTER — Encounter: Payer: Self-pay | Admitting: Internal Medicine

## 2020-05-23 VITALS — BP 126/70 | HR 77 | Temp 98.2°F | Ht 65.0 in | Wt 185.4 lb

## 2020-05-23 DIAGNOSIS — R220 Localized swelling, mass and lump, head: Secondary | ICD-10-CM

## 2020-05-23 NOTE — Patient Instructions (Signed)
  See dentist  To check for any dental  Problems and poss cause of  Swelling  May not be serious  Problem   But want to refer to ENT to have them do good exam and advise if further imagining is   Required.   You will be contacted about ENT visit .

## 2020-05-23 NOTE — Progress Notes (Signed)
Chief Complaint  Patient presents with  . Acute Visit    C/o having a knot on her jawbone x 2 weeks.       HPI: [name] [name] 65 y.o. come in for acute problem   And fu ed 12 29 21  for right facial  swelling  rl jaw swelling and ct ordered but had to be canceled because of logistics of Medicare rules.  Since she was coming in for evaluation it was not reordered. Since that time it is not worse and is not painful. Is ongoing about 2 weeks tends to clench her jaw but not painful although may have been tender early on. She is due to see her dentist in February anyway and has all had a lot of dental work in the past but no abscess or acute dental problem. No new symptoms have arisen systemic wise no fever cough congestion other swollen areas. She first noticed it incidentally.  There is been no trauma.  Noted week before x mas   ROS: See pertinent positives and negatives per HPI.  Past Medical History:  Diagnosis Date  . Arthritis   . Bunion    left foot  . Depression    stable on medication  . Diverticulitis 2015  . Diverticulitis of colon without hemorrhage 09/28/2013  . Diverticulosis   . Gallstones   . GERD (gastroesophageal reflux disease)   . Hammer toes of both feet   . Hyperlipidemia    no meds needed  . IBS (irritable bowel syndrome)   . Onychomycosis of toenail   . Osteopenia   . PUD (peptic ulcer disease) 1980's  . Renal cyst    4.5-6 cm stable single  seen by uro in past    Family History  Problem Relation Age of Onset  . Rheum arthritis Father   . Pneumonia Father        deceased  . Colon cancer Father 18  . Heart attack Father   . Breast cancer Sister 20  . Colon polyps Sister   . Breast cancer Paternal Grandmother 64  . Osteoarthritis Mother   . COPD Mother   . Pneumonia Mother   . Osteoporosis Mother   . Cancer Maternal Grandmother        ? GI  . Heart failure Maternal Grandfather   . Heart failure Paternal Grandfather   . Esophageal cancer  Neg Hx   . Rectal cancer Neg Hx   . Stomach cancer Neg Hx     Social History   Socioeconomic History  . Marital status: Divorced    Spouse name: Not on file  . Number of children: 0  . Years of education: Not on file  . Highest education level: Not on file  Occupational History  . Occupation: Product manager: Teaching laboratory technician SCHOOL  Tobacco Use  . Smoking status: Never Smoker  . Smokeless tobacco: Never Used  Vaping Use  . Vaping Use: Never used  Substance and Sexual Activity  . Alcohol use: No    Comment: occasionally  . Drug use: No  . Sexual activity: Not Currently    Birth control/protection: Post-menopausal  Other Topics Concern  . Not on file  Social History Narrative   Teacher media specialist graduate degree Oncologist.   Non smoker    Hh of 1 ... 2 cats    Single   G0P0   Mom is now in assisted living and she has moved into her old residence  Social Determinants of Health   Financial Resource Strain: Not on file  Food Insecurity: Not on file  Transportation Needs: Not on file  Physical Activity: Not on file  Stress: Not on file  Social Connections: Not on file    Outpatient Medications Prior to Visit  Medication Sig Dispense Refill  . Cholecalciferol (VITAMIN D PO) Take 2,000 mg by mouth every other day.     Marland Kitchen FLUoxetine (PROZAC) 40 MG capsule Take 40 mg by mouth daily.    . Multiple Vitamins-Minerals (CENTRUM ULTRA WOMENS PO) Take 1 tablet by mouth every other day.    Marland Kitchen omeprazole (PRILOSEC) 20 MG capsule Take 1 capsule by mouth once daily 90 capsule 0  . Probiotic Product (PROBIOTIC PO) Take by mouth daily.    . ciclopirox (PENLAC) 8 % solution Apply topically at bedtime. Apply over nail and surrounding skin. Apply daily over previous coat. After seven (7) days, may remove with alcohol and continue cycle. (Patient not taking: Reported on 05/23/2020) 6.6 mL 1   Facility-Administered Medications Prior to Visit  Medication Dose Route Frequency  Provider Last Rate Last Admin  . 0.9 %  sodium chloride infusion  500 mL Intravenous Once Charlie Pitter III, MD         EXAM:  BP 126/70   Pulse 77   Temp 98.2 F (36.8 C) (Oral)   Ht 5\' 5"  (1.651 m)   Wt 185 lb 6.4 oz (84.1 kg)   LMP 11/18/2004   SpO2 98%   BMI 30.85 kg/m   Body mass index is 30.85 kg/m.  GENERAL: vitals reviewed and listed above, alert, oriented, appears well hydrated and in no acute distress HEENT: atraumatic, conjunctiva  clear, no obvious abnormalities on inspection of external nose and ears OP : no lesion edema or exudate TMs are clear There is a diffuse mild asymmetrical swelling right submet tubular face jaw area on the right felt mostly on palpation different than the left. Neck no specific adenopathy otherwise. Abdomen without organomegaly splenomegaly. LUNGS: clear to auscultation bilaterally, no wheezes, rales or rhonchi, good air movement CV: HRRR, no clubbing cyanosis or  peripheral edema nl cap refill  MS: moves all extremities without noticeable focal  abnormality PSYCH: pleasant and cooperative, no obvious depression or anxiety  BP Readings from Last 3 Encounters:  05/23/20 126/70  05/18/20 129/71  09/09/19 120/72    ASSESSMENT AND PLAN:  Discussed the following assessment and plan:  Jaw swelling - Plan: Ambulatory referral to ENT  Swelling of right side of face - Plan: Ambulatory referral to ENT Swelling asymmetry it seems to be diffuse in the submandibular or mandibular cheek area.  Uncertain of significance although this is a change for the patient. She will get a dental exam done and I will refer to ENT for good exam and help with the advisability of proceeding with any imaging. Preview ED visit evaluation counsel  Plan 32 minutes -Patient advised to return or notify health care team  if  new concerns arise.  Patient Instructions   See dentist  To check for any dental  Problems and poss cause of  Swelling  May not be serious   Problem   But want to refer to ENT to have them do good exam and advise if further imagining is   Required.   You will be contacted about ENT visit .       09/11/19. Quanta Robertshaw M.D.

## 2020-05-25 ENCOUNTER — Encounter: Payer: Self-pay | Admitting: Gastroenterology

## 2020-05-25 ENCOUNTER — Ambulatory Visit: Payer: Medicare PPO | Admitting: Gastroenterology

## 2020-05-25 VITALS — BP 132/78 | HR 83 | Ht 65.0 in | Wt 185.2 lb

## 2020-05-25 DIAGNOSIS — R1011 Right upper quadrant pain: Secondary | ICD-10-CM

## 2020-05-25 DIAGNOSIS — K219 Gastro-esophageal reflux disease without esophagitis: Secondary | ICD-10-CM

## 2020-05-25 NOTE — Progress Notes (Signed)
[facility] Progress Note  Chief Complaint: GERD without esophagitis  Subjective  History: Last seen November 2020, chronic stable GERD symptoms, I recommended de-escalation of acid suppression therapy, something which she had been previously somewhat reluctant to do because it works well to control her heartburn.  She has continued to take 20 mg once a day and is happy with that.  She denies dysphagia or odynophagia nausea or vomiting.  Appetite has been good and weight stable. She has been enjoying her retirement for the last year, reading and volunteering at a [employer].  Last colonoscopy with me November 2019 complete exam with good prep and no polyps.  Recommendation was recall in 5 years due to history of colon cancer in her father. In the last couple of months she has had some brief episodes of a burning discomfort in the right upper quadrant that usually happens in the evening with no associated symptoms.  ROS: Cardiovascular:  no chest pain Respiratory: no dyspnea  The patient's Past Medical, Family and Social History were reviewed and are on file in the EMR.  Objective:  Med list reviewed  Current Outpatient Medications:  .  Cholecalciferol (VITAMIN D PO), Take 2,000 mg by mouth every other day. , Disp: , Rfl:  .  FLUoxetine (PROZAC) 40 MG capsule, Take 40 mg by mouth daily., Disp: , Rfl:  .  Multiple Vitamins-Minerals (CENTRUM ULTRA WOMENS PO), Take 1 tablet by mouth every other day., Disp: , Rfl:  .  omeprazole (PRILOSEC) 20 MG capsule, Take 1 capsule by mouth once daily, Disp: 90 capsule, Rfl: 0 .  Probiotic Product (PROBIOTIC PO), Take by mouth daily., Disp: , Rfl:   Current Facility-Administered Medications:  .  0.9 %  sodium chloride infusion, 500 mL, Intravenous, Once, Danis, Estill Cotta III, MD   Vital signs in last 24 hrs: Vitals:   05/25/20 1501  BP: 132/78  Pulse: 83    Physical Exam  Well-appearing  HEENT: sclera anicteric, oral mucosa  moist without lesions  Neck: supple, no thyromegaly, JVD or lymphadenopathy  Cardiac: RRR without murmurs, S1S2 heard, no peripheral edema  Pulm: clear to auscultation bilaterally, normal RR and effort noted  Abdomen: soft, no tenderness, with active bowel sounds. No guarding or palpable hepatosplenomegaly.  Small reducible umbilical hernia.  Open cholecystectomy scar without hernia or tenderness  Skin; warm and dry, no jaundice or rash  Labs:  CBC Latest Ref Rng & Units 05/25/2019 12/27/2017 10/10/2016  WBC 4.0 - 10.5 K/uL 5.1 5.9 6.2  Hemoglobin 12.0 - 15.0 g/dL 13.2 13.2 13.3  Hematocrit 36.0 - 46.0 % 40.0 39.1 40.3  Platelets 150.0 - 400.0 K/uL 280.0 263.0 237.0   CMP Latest Ref Rng & Units 05/25/2019 05/19/2018 10/10/2016  Glucose 70 - 99 mg/dL 100(H) 90 87  BUN 6 - 23 mg/dL 16 13 13   Creatinine 0.40 - 1.20 mg/dL 0.65 0.75 0.75  Sodium 135 - 145 mEq/L 138 139 137  Potassium 3.5 - 5.1 mEq/L 4.4 4.6 3.9  Chloride 96 - 112 mEq/L 101 101 99  CO2 19 - 32 mEq/L 28 32 29  Calcium 8.4 - 10.5 mg/dL 9.4 9.6 9.2  Total Protein 6.0 - 8.3 g/dL 6.9 6.8 6.8  Total Bilirubin 0.2 - 1.2 mg/dL 0.5 0.6 0.4  Alkaline Phos 39 - 117 U/L 117 120(H) 95  AST 0 - 37 U/L 17 17 23   ALT 0 - 35 U/L 16 15 17      ___________________________________________ Radiologic studies:  Last abdominal CT scan from May 2015;  CLINICAL DATA:  LEFT lower quadrant superior pubic abdominal pain  for 1 day, low grade fever, unknown colonic diverticulosis, prior  cholecystectomy, question appendicitis versus diverticulitis   EXAM:  CT ABDOMEN AND PELVIS WITH CONTRAST   TECHNIQUE:  Multidetector CT imaging of the abdomen and pelvis was performed  using the standard protocol following bolus administration of  intravenous contrast. Sagittal and coronal MPR images reconstructed  from axial data set.   CONTRAST:  OMNIPAQUE IOHEXOL 300 MG/ML SOLN. Dilute oral  contrast.   COMPARISON:  03/25/2012   FINDINGS:   Dependent atelectasis at both lung bases.   Large cyst upper pole LEFT kidney 5.5 x 4.2 cm.   Colon interposition between liver and diaphragm.   Post cholecystectomy.   Liver, spleen, pancreas, kidneys, and adrenal glands otherwise  normal.   Mobile cecum located in RIGHT upper quadrant.   Normal appendix adjacent to liver.   Small umbilical hernia containing fat.   Increased stool in rectum.   Diverticulosis of sigmoid colon with mid sigmoid wall thickening and  pericolic inflammation consistent with acute diverticulitis.   No definite evidence of perforation or abscess.   Unremarkable bladder, ureters, uterus and RIGHT ovary with note of a  tiny LEFT ovarian cyst.   No mass, adenopathy, gross ascites or free air.   Degenerative changes of hips greater on RIGHT.   IMPRESSION:  Acute sigmoid diverticulitis with out evidence of abscess or  perforation.   Small umbilical hernia containing fat.    Electronically Signed    By: [name] [name] M.D.    On: 09/28/2013 16:12   ____________________________________________ Other:   _____________________________________________ Assessment & Plan  Assessment: Encounter Diagnoses  Name Primary?  . Gastroesophageal reflux disease without esophagitis Yes  . RUQ abdominal pain    Stable GERD symptoms, she would like to continue PPI so I refilled it I have reviewed the indications, risks, and benefits of PPI therapy with the patient today. I have discussed studies that raise ? of increased osteoporosis, and kidney failure and explained that these studies show very weak associations of unclear significance and not clear cause and effect. We did discuss the potential for vitamin malabsorption, to include magnesium (very rare), calcium (easily modifiable with Calcium Citrate supplement), vitamin B12 (again, correctable with oral B12 supplement), and iron (although rarely clinically significant outside patients who require iron  supplementation previously), and can monitor each of these periodically with routine labs. We have agreed to continue PPI treatment in this case. She has been getting regular follow-up with primary care.  The right upper quadrant pain sounds benign, no red flag symptoms, reassurance given   Plan: See me in a year or sooner as needed.  20 minutes were spent on this encounter (including chart review, history/exam, counseling/coordination of care, and documentation) > 50% of that time was spent on counseling and coordination of care.  Topics discussed included: GERD and medical management  Charlie Pitter [name]

## 2020-05-25 NOTE — Patient Instructions (Signed)
If you are age 66 or older, your body mass index should be between 23-30. Your Body mass index is 30.83 kg/m. If this is out of the aforementioned range listed, please consider follow up with your Primary Care Provider.  If you are age 56 or younger, your body mass index should be between 19-25. Your Body mass index is 30.83 kg/m. If this is out of the aformentioned range listed, please consider follow up with your Primary Care Provider.   Thank you for entrusting me with your care and choosing [facility].  Dr Myrtie Neither

## 2020-05-27 ENCOUNTER — Encounter: Payer: Medicare PPO | Admitting: Internal Medicine

## 2020-05-31 NOTE — Progress Notes (Signed)
Chief Complaint  Patient presents with   Annual Exam    Not fasting, wants rx for shingles vaccine.     HPI: [name] [name] 65 y.o. comes in today for Preventive Medicare exam/ wellness visit .Since last visit.  Fluoxetine same medicine for a while Dr. Ronnald Ramp Still taking omeprazole per Dr. Hinton Dyer seems to be helpful Has future ENT appointment for the neck swelling Had root canal.    Recently seemed to do okay with it Realize she has gained weight BMI is up to 30 plans to work on it Information about vaccines shingles vaccine.  Recently had the [place] booster   Health Maintenance  Topic Date Due   PAP SMEAR-Modifier  11/29/2020   PNA vac Low Risk Adult (2 of 2 - PPSV23) 06/01/2021   MAMMOGRAM  05/04/2022   COLONOSCOPY (Pts 45-51yrs Insurance coverage will need to be confirmed)  03/29/2023   TETANUS/TDAP  05/14/2023   INFLUENZA VACCINE  Completed   DEXA SCAN  Completed   COVID-19 Vaccine  Completed   Hepatitis C Screening  Completed   HIV Screening  Completed   Health Maintenance Review LIFESTYLE:  Exercise:  Not enough .   Walking.    10 minutes . Tobacco/ETS: no Alcohol:  ocass  holidays  Sugar bev erages:  Glass  ice tea per day.   Sleep:  About 7-8  Drug use: no HH:  1 no pets  Retired  A year.      Hearing:   Ok   Vision:  No limitations at present . Last eye check UTD  Safety:  Has smoke detector and wears seat belts.  . No excess sun exposure. Sees dentist regularly.  Falls: No  Has healthcare power of attorney advanced directive attention  Memory: Felt to be good  , no concern from her or her family.  Depression: No anhedonia unusual crying or depressive symptoms  Nutrition: Eats well balanced diet; adequate calcium and vitamin D. No swallowing chewing problems.  Injury: no major injuries in the last six months.  Other healthcare providers:  Reviewed today   Preventive parameters: up-to-date  Reviewed   ADLS:   There are no problems  or need for assistance  driving, feeding, obtaining food, dressing, toileting and bathing, managing money using phone. She is independent.  Retired a year ago    ROS: See HPI GEN/ HEENT: No fever, significant weight changes sweats headaches vision problems hearing changes, CV/ PULM; No chest pain shortness of breath cough, syncope,edema  change in exercise tolerance. GI /GU: No adominal pain, vomiting, change in bowel habits. No blood in the stool. No significant GU symptoms. SKIN/HEME: ,no acute skin rashes suspicious lesions or bleeding. No lymphadenopathy, nodules, masses.  NEURO/ PSYCH:  No neurologic signs such as weakness numbness. No depression anxiety. IMM/ Allergy: No unusual infections.  Allergy .   REST of 12 system review negative except as per HPI   Past Medical History:  Diagnosis Date   Arthritis    Bunion    left foot   Depression    stable on medication   Diverticulitis 2015   Diverticulitis of colon without hemorrhage 09/28/2013   Diverticulosis    Gallstones    GERD (gastroesophageal reflux disease)    Hammer toes of both feet    Hyperlipidemia    no meds needed   IBS (irritable bowel syndrome)    Onychomycosis of toenail    Osteopenia    PUD (peptic ulcer disease) 1980's  Renal cyst    4.5-6 cm stable single  seen by uro in past    Family History  Problem Relation Age of Onset   Rheum arthritis Father    Pneumonia Father        deceased   Colon cancer Father 4   Heart attack Father    Breast cancer Sister 79   Colon polyps Sister    Breast cancer Paternal Grandmother 67   Osteoarthritis Mother    COPD Mother    Pneumonia Mother    Osteoporosis Mother    Cancer Maternal Grandmother        ? GI   Heart failure Maternal Grandfather    Heart failure Paternal Grandfather    Esophageal cancer Neg Hx    Rectal cancer Neg Hx    Stomach cancer Neg Hx     Social History   Socioeconomic History   Marital status:  Divorced    Spouse name: Not on file   Number of children: 0   Years of education: Not on file   Highest education level: Not on file  Occupational History   Occupation: TEACHER    Employer: [employer]  Tobacco Use   Smoking status: Never Smoker   Smokeless tobacco: Never Used  Scientific laboratory technician Use: Never used  Substance and Sexual Activity   Alcohol use: No    Comment: occasionally   Drug use: No   Sexual activity: Not Currently    Birth control/protection: Post-menopausal  Other Topics Concern   Not on file  Social History Narrative   Teacher media specialist graduate degree Oncologist.   Non smoker    Hh of 1 ... 2 cats    Single   [id number]   Mom is now in assisted living and she has moved into her old residence   Social Determinants of Health   Financial Resource Strain: Not on file  Food Insecurity: Not on file  Transportation Needs: Not on file  Physical Activity: Not on file  Stress: Not on file  Social Connections: Not on file    Outpatient Encounter Medications as of 06/01/2020  Medication Sig   Cholecalciferol (VITAMIN D PO) Take 2,000 mg by mouth every other day.    FLUoxetine (PROZAC) 40 MG capsule Take 40 mg by mouth daily.   Multiple Vitamins-Minerals (CENTRUM ULTRA WOMENS PO) Take 1 tablet by mouth every other day.   omeprazole (PRILOSEC) 20 MG capsule Take 1 capsule by mouth once daily   Probiotic Product (PROBIOTIC PO) Take by mouth daily.   Facility-Administered Encounter Medications as of 06/01/2020  Medication   0.9 %  sodium chloride infusion    EXAM:  BP 118/70 (BP Location: Left Arm, Patient Position: Sitting, Cuff Size: Large)    Pulse 74    Temp 98.2 F (36.8 C) (Oral)    Ht $R'5\' 5"'PB$  (1.651 m)    Wt 185 lb 6.4 oz (84.1 kg)    LMP 11/18/2004    SpO2 97%    BMI 30.85 kg/m   Body mass index is 30.85 kg/m.  Physical Exam: Vital signs reviewed PYK:DXIP is a well-developed well-nourished alert cooperative    who appears stated age in no acute distress.  HEENT: normocephalic atraumatic , Eyes: PERRL EOM's full, conjunctiva clear, Nares: paten,t no deformity discharge or tenderness., Ears: no deformity EAC's clear TMs with normal landmarks. Mouth masked  Neck no thyromegaly jaw not checked CHEST/PULM:  Clear to auscultation and percussion breath sounds  equal no wheeze , rales or rhonchi. No chest wall deformities or tenderness. CV: PMI is nondisplaced, S1 S2 no gallops, murmurs, rubs. Peripheral pulses are full without delay.No JVD .  ABDOMEN: Bowel sounds normal nontender  No guard or rebound, no hepato splenomegal no CVA tenderness.   Extremtities:  No clubbing cyanosis or edema, no acute joint swelling or redness no focal atrophyvaricose veins noted no change no ulcerations NEURO:  Oriented x3, cranial nerves 3-12 appear to be intact, no obvious focal weakness,gait within normal limits no abnormal reflexes or asymmetrical SKIN: No acute rashes normal turgor, color, no bruising or petechiae. PSYCH: Oriented, good eye contact, no obvious depression anxiety, cognition and judgment appear normal. LN: no cervical axillary inguinal adenopathy No noted deficits in memory, attention, and speech.   Lab Results  Component Value Date   WBC 5.1 05/25/2019   HGB 13.2 05/25/2019   HCT 40.0 05/25/2019   PLT 280.0 05/25/2019   GLUCOSE 100 (H) 05/25/2019   CHOL 221 (H) 05/25/2019   TRIG 115.0 05/25/2019   HDL 74.10 05/25/2019   LDLDIRECT 132.7 03/24/2013   LDLCALC 124 (H) 05/25/2019   ALT 16 05/25/2019   AST 17 05/25/2019   NA 138 05/25/2019   K 4.4 05/25/2019   CL 101 05/25/2019   CREATININE 0.65 05/25/2019   BUN 16 05/25/2019   CO2 28 05/25/2019   TSH 1.20 05/25/2019  Patient nonfasting today  ASSESSMENT AND PLAN:  Discussed the following assessment and plan:  Visit for preventive health examination  Osteopenia, unspecified location - Plan: Hemoglobin F5D, Basic metabolic panel, CBC with  Differential/Platelet, Hepatic function panel, Lipid panel, TSH  Hypercholesteremia - Plan: Hemoglobin D2K, Basic metabolic panel, CBC with Differential/Platelet, Hepatic function panel, Lipid panel, TSH  Elevated blood sugar - Plan: Hemoglobin G2R, Basic metabolic panel, CBC with Differential/Platelet, Hepatic function panel, Lipid panel, TSH  Elevated alkaline phosphatase measurement  Medication management - Plan: Hemoglobin K2H, Basic metabolic panel, CBC with Differential/Platelet, Hepatic function panel, Lipid panel, TSH  Need for vaccination with 13-polyvalent pneumococcal conjugate vaccine - Plan: Pneumococcal conjugate vaccine 13-valent Need updated labs not fasting today and had borderline elevated sugar in past plan fasting labs In addition a follow-up labs regaining osteopenia remote history of alk phos that was improved last year. Fasting lipid panel advised Discussed immunizations Prevnar 13 today Pneumovax next year can get Shingrix at pharmacy Address BMI of 30 she is going to work on it getting below 30 Generally she is healthy otherwise. Keep ENT appointment to assess puffiness around the jaw face area.  Patient Care Team: Shamirah Ivan, Standley Brooking, MD as PCP - Saralyn Pilar, FNP as Nurse Practitioner (Nurse Practitioner) Rolan Bucco, MD as Consulting Physician (Urology) Loletha Carrow Kirke Corin, MD as Consulting Physician (Gastroenterology) Alyson Ingles Candee Furbish, MD as Consulting Physician (Urology)  Patient Instructions   Make appt for fasting labs    Intensify lifestyle interventions. As we discussed  Get bmi below 30  prevnar today and pneumovax 23  In  A year.  Shingrix vaccine at your pharmacy.    Keep ENT appt.    Health Maintenance, Female Adopting a healthy lifestyle and getting preventive care are important in promoting health and wellness. Ask your health care provider about:  The right schedule for you to have regular tests and exams.  Things you  can do on your own to prevent diseases and keep yourself healthy. What should I know about diet, weight, and exercise? Eat a healthy diet  Eat  a diet that includes plenty of vegetables, fruits, low-fat dairy products, and lean protein.  Do not eat a lot of foods that are high in solid fats, added sugars, or sodium.   Maintain a healthy weight Body mass index (BMI) is used to identify weight problems. It estimates body fat based on height and weight. Your health care provider can help determine your BMI and help you achieve or maintain a healthy weight. Get regular exercise Get regular exercise. This is one of the most important things you can do for your health. Most adults should:  Exercise for at least 150 minutes each week. The exercise should increase your heart rate and make you sweat (moderate-intensity exercise).  Do strengthening exercises at least twice a week. This is in addition to the moderate-intensity exercise.  Spend less time sitting. Even light physical activity can be beneficial. Watch cholesterol and blood lipids Have your blood tested for lipids and cholesterol at 66 years of age, then have this test every 5 years. Have your cholesterol levels checked more often if:  Your lipid or cholesterol levels are high.  You are older than 66 years of age.  You are at high risk for heart disease. What should I know about cancer screening? Depending on your health history and family history, you may need to have cancer screening at various ages. This may include screening for:  Breast cancer.  Cervical cancer.  Colorectal cancer.  Skin cancer.  Lung cancer. What should I know about heart disease, diabetes, and high blood pressure? Blood pressure and heart disease  High blood pressure causes heart disease and increases the risk of stroke. This is more likely to develop in people who have high blood pressure readings, are of African descent, or are overweight.  Have  your blood pressure checked: ? Every 3-5 years if you are 11-60 years of age. ? Every year if you are 51 years old or older. Diabetes Have regular diabetes screenings. This checks your fasting blood sugar level. Have the screening done:  Once every three years after age 21 if you are at a normal weight and have a low risk for diabetes.  More often and at a younger age if you are overweight or have a high risk for diabetes. What should I know about preventing infection? Hepatitis B If you have a higher risk for hepatitis B, you should be screened for this virus. Talk with your health care provider to find out if you are at risk for hepatitis B infection. Hepatitis C Testing is recommended for:  Everyone born from [birth year] through 1966.  Anyone with known risk factors for hepatitis C. Sexually transmitted infections (STIs)  Get screened for STIs, including gonorrhea and chlamydia, if: ? You are sexually active and are younger than 66 years of age. ? You are older than 66 years of age and your health care provider tells you that you are at risk for this type of infection. ? Your sexual activity has changed since you were last screened, and you are at increased risk for chlamydia or gonorrhea. Ask your health care provider if you are at risk.  Ask your health care provider about whether you are at high risk for HIV. Your health care provider may recommend a prescription medicine to help prevent HIV infection. If you choose to take medicine to prevent HIV, you should first get tested for HIV. You should then be tested every 3 months for as long as you are taking the  medicine. Pregnancy  If you are about to stop having your period (premenopausal) and you may become pregnant, seek counseling before you get pregnant.  Take 400 to 800 micrograms (mcg) of folic acid every day if you become pregnant.  Ask for birth control (contraception) if you want to prevent pregnancy. Osteoporosis and  menopause Osteoporosis is a disease in which the bones lose minerals and strength with aging. This can result in bone fractures. If you are 11 years old or older, or if you are at risk for osteoporosis and fractures, ask your health care provider if you should:  Be screened for bone loss.  Take a calcium or vitamin D supplement to lower your risk of fractures.  Be given hormone replacement therapy (HRT) to treat symptoms of menopause. Follow these instructions at home: Lifestyle  Do not use any products that contain nicotine or tobacco, such as cigarettes, e-cigarettes, and chewing tobacco. If you need help quitting, ask your health care provider.  Do not use street drugs.  Do not share needles.  Ask your health care provider for help if you need support or information about quitting drugs. Alcohol use  Do not drink alcohol if: ? Your health care provider tells you not to drink. ? You are pregnant, may be pregnant, or are planning to become pregnant.  If you drink alcohol: ? Limit how much you use to 0-1 drink a day. ? Limit intake if you are breastfeeding.  Be aware of how much alcohol is in your drink. In the [place] one drink equals one 12 oz bottle of beer (355 mL), one 5 oz glass of wine (148 mL), or one 1 oz glass of hard liquor (44 mL). General instructions  Schedule regular health, dental, and eye exams.  Stay current with your vaccines.  Tell your health care provider if: ? You often feel depressed. ? You have ever been abused or do not feel safe at home. Summary  Adopting a healthy lifestyle and getting preventive care are important in promoting health and wellness.  Follow your health care provider's instructions about healthy diet, exercising, and getting tested or screened for diseases.  Follow your health care provider's instructions on monitoring your cholesterol and blood pressure. This information is not intended to replace advice given to you by your  health care provider. Make sure you discuss any questions you have with your health care provider. Document Revised: 04/30/2018 Document Reviewed: 04/30/2018 Elsevier Patient Education  2021 [name] [name] M.D.

## 2020-06-01 ENCOUNTER — Ambulatory Visit (INDEPENDENT_AMBULATORY_CARE_PROVIDER_SITE_OTHER): Payer: Medicare PPO | Admitting: Internal Medicine

## 2020-06-01 ENCOUNTER — Other Ambulatory Visit: Payer: Self-pay

## 2020-06-01 ENCOUNTER — Encounter: Payer: Self-pay | Admitting: Internal Medicine

## 2020-06-01 VITALS — BP 118/70 | HR 74 | Temp 98.2°F | Ht 65.0 in | Wt 185.4 lb

## 2020-06-01 DIAGNOSIS — Z6830 Body mass index (BMI) 30.0-30.9, adult: Secondary | ICD-10-CM

## 2020-06-01 DIAGNOSIS — Z79899 Other long term (current) drug therapy: Secondary | ICD-10-CM

## 2020-06-01 DIAGNOSIS — R748 Abnormal levels of other serum enzymes: Secondary | ICD-10-CM

## 2020-06-01 DIAGNOSIS — Z23 Encounter for immunization: Secondary | ICD-10-CM | POA: Diagnosis not present

## 2020-06-01 DIAGNOSIS — M8580 Other specified disorders of bone density and structure, unspecified site: Secondary | ICD-10-CM

## 2020-06-01 DIAGNOSIS — E7800 Pure hypercholesterolemia, unspecified: Secondary | ICD-10-CM | POA: Diagnosis not present

## 2020-06-01 DIAGNOSIS — R739 Hyperglycemia, unspecified: Secondary | ICD-10-CM | POA: Diagnosis not present

## 2020-06-01 DIAGNOSIS — Z0000 Encounter for general adult medical examination without abnormal findings: Secondary | ICD-10-CM

## 2020-06-01 NOTE — Patient Instructions (Signed)
Make appt for fasting labs    Intensify lifestyle interventions. As we discussed  Get bmi below 30  prevnar today and pneumovax 23  In  A year.  Shingrix vaccine at your pharmacy.    Keep ENT appt.    Health Maintenance, Female Adopting a healthy lifestyle and getting preventive care are important in promoting health and wellness. Ask your health care provider about:  The right schedule for you to have regular tests and exams.  Things you can do on your own to prevent diseases and keep yourself healthy. What should I know about diet, weight, and exercise? Eat a healthy diet  Eat a diet that includes plenty of vegetables, fruits, low-fat dairy products, and lean protein.  Do not eat a lot of foods that are high in solid fats, added sugars, or sodium.   Maintain a healthy weight Body mass index (BMI) is used to identify weight problems. It estimates body fat based on height and weight. Your health care provider can help determine your BMI and help you achieve or maintain a healthy weight. Get regular exercise Get regular exercise. This is one of the most important things you can do for your health. Most adults should:  Exercise for at least 150 minutes each week. The exercise should increase your heart rate and make you sweat (moderate-intensity exercise).  Do strengthening exercises at least twice a week. This is in addition to the moderate-intensity exercise.  Spend less time sitting. Even light physical activity can be beneficial. Watch cholesterol and blood lipids Have your blood tested for lipids and cholesterol at 66 years of age, then have this test every 5 years. Have your cholesterol levels checked more often if:  Your lipid or cholesterol levels are high.  You are older than 66 years of age.  You are at high risk for heart disease. What should I know about cancer screening? Depending on your health history and family history, you may need to have cancer screening at  various ages. This may include screening for:  Breast cancer.  Cervical cancer.  Colorectal cancer.  Skin cancer.  Lung cancer. What should I know about heart disease, diabetes, and high blood pressure? Blood pressure and heart disease  High blood pressure causes heart disease and increases the risk of stroke. This is more likely to develop in people who have high blood pressure readings, are of African descent, or are overweight.  Have your blood pressure checked: ? Every 3-5 years if you are 88-95 years of age. ? Every year if you are 31 years old or older. Diabetes Have regular diabetes screenings. This checks your fasting blood sugar level. Have the screening done:  Once every three years after age 65 if you are at a normal weight and have a low risk for diabetes.  More often and at a younger age if you are overweight or have a high risk for diabetes. What should I know about preventing infection? Hepatitis B If you have a higher risk for hepatitis B, you should be screened for this virus. Talk with your health care provider to find out if you are at risk for hepatitis B infection. Hepatitis C Testing is recommended for:  Everyone born from [birth year] through 1965.  Anyone with known risk factors for hepatitis C. Sexually transmitted infections (STIs)  Get screened for STIs, including gonorrhea and chlamydia, if: ? You are sexually active and are younger than 66 years of age. ? You are older than 66 years  of age and your health care provider tells you that you are at risk for this type of infection. ? Your sexual activity has changed since you were last screened, and you are at increased risk for chlamydia or gonorrhea. Ask your health care provider if you are at risk.  Ask your health care provider about whether you are at high risk for HIV. Your health care provider may recommend a prescription medicine to help prevent HIV infection. If you choose to take medicine to prevent  HIV, you should first get tested for HIV. You should then be tested every 3 months for as long as you are taking the medicine. Pregnancy  If you are about to stop having your period (premenopausal) and you may become pregnant, seek counseling before you get pregnant.  Take 400 to 800 micrograms (mcg) of folic acid every day if you become pregnant.  Ask for birth control (contraception) if you want to prevent pregnancy. Osteoporosis and menopause Osteoporosis is a disease in which the bones lose minerals and strength with aging. This can result in bone fractures. If you are 73 years old or older, or if you are at risk for osteoporosis and fractures, ask your health care provider if you should:  Be screened for bone loss.  Take a calcium or vitamin D supplement to lower your risk of fractures.  Be given hormone replacement therapy (HRT) to treat symptoms of menopause. Follow these instructions at home: Lifestyle  Do not use any products that contain nicotine or tobacco, such as cigarettes, e-cigarettes, and chewing tobacco. If you need help quitting, ask your health care provider.  Do not use street drugs.  Do not share needles.  Ask your health care provider for help if you need support or information about quitting drugs. Alcohol use  Do not drink alcohol if: ? Your health care provider tells you not to drink. ? You are pregnant, may be pregnant, or are planning to become pregnant.  If you drink alcohol: ? Limit how much you use to 0-1 drink a day. ? Limit intake if you are breastfeeding.  Be aware of how much alcohol is in your drink. In the [place] one drink equals one 12 oz bottle of beer (355 mL), one 5 oz glass of wine (148 mL), or one 1 oz glass of hard liquor (44 mL). General instructions  Schedule regular health, dental, and eye exams.  Stay current with your vaccines.  Tell your health care provider if: ? You often feel depressed. ? You have ever been abused or do  not feel safe at home. Summary  Adopting a healthy lifestyle and getting preventive care are important in promoting health and wellness.  Follow your health care provider's instructions about healthy diet, exercising, and getting tested or screened for diseases.  Follow your health care provider's instructions on monitoring your cholesterol and blood pressure. This information is not intended to replace advice given to you by your health care provider. Make sure you discuss any questions you have with your health care provider. Document Revised: 04/30/2018 Document Reviewed: 04/30/2018 Elsevier Patient Education  2021 [employer].

## 2020-06-10 ENCOUNTER — Other Ambulatory Visit: Payer: Medicare PPO

## 2020-06-17 ENCOUNTER — Other Ambulatory Visit: Payer: Medicare PPO

## 2020-06-17 ENCOUNTER — Other Ambulatory Visit (INDEPENDENT_AMBULATORY_CARE_PROVIDER_SITE_OTHER): Payer: Medicare PPO

## 2020-06-17 ENCOUNTER — Other Ambulatory Visit: Payer: Self-pay

## 2020-06-17 DIAGNOSIS — Z79899 Other long term (current) drug therapy: Secondary | ICD-10-CM

## 2020-06-17 DIAGNOSIS — R7989 Other specified abnormal findings of blood chemistry: Secondary | ICD-10-CM

## 2020-06-17 DIAGNOSIS — R945 Abnormal results of liver function studies: Secondary | ICD-10-CM

## 2020-06-17 DIAGNOSIS — E7800 Pure hypercholesterolemia, unspecified: Secondary | ICD-10-CM

## 2020-06-17 DIAGNOSIS — R739 Hyperglycemia, unspecified: Secondary | ICD-10-CM

## 2020-06-17 DIAGNOSIS — M8580 Other specified disorders of bone density and structure, unspecified site: Secondary | ICD-10-CM | POA: Diagnosis not present

## 2020-06-17 LAB — BASIC METABOLIC PANEL
BUN: 19 mg/dL (ref 6–23)
CO2: 32 mEq/L (ref 19–32)
Calcium: 9.7 mg/dL (ref 8.4–10.5)
Chloride: 103 mEq/L (ref 96–112)
Creatinine, Ser: 0.76 mg/dL (ref 0.40–1.20)
GFR: 82.21 mL/min (ref >60.00)
Glucose, Bld: 93 mg/dL (ref 70–99)
Potassium: 4.5 mEq/L (ref 3.5–5.1)
Sodium: 141 mEq/L (ref 135–145)

## 2020-06-17 LAB — CBC WITH DIFFERENTIAL/PLATELET
Basophils Absolute: 0.1 10*3/uL (ref 0.0–0.1)
Basophils Relative: 2.2 % (ref 0.0–3.0)
Eosinophils Absolute: 0.1 10*3/uL (ref 0.0–0.7)
Eosinophils Relative: 2.7 % (ref 0.0–5.0)
HCT: 41.3 % (ref 36.0–46.0)
Hemoglobin: 13.7 g/dL (ref 12.0–15.0)
Lymphocytes Relative: 27.7 % (ref 12.0–46.0)
Lymphs Abs: 1.3 10*3/uL (ref 0.7–4.0)
MCHC: 33.3 g/dL (ref 30.0–36.0)
MCV: 88.2 fl (ref 78.0–100.0)
Monocytes Absolute: 0.4 10*3/uL (ref 0.1–1.0)
Monocytes Relative: 8.8 % (ref 3.0–12.0)
Neutro Abs: 2.7 10*3/uL (ref 1.4–7.7)
Neutrophils Relative %: 58.6 % (ref 43.0–77.0)
Platelets: 278 10*3/uL (ref 150.0–400.0)
RBC: 4.69 Mil/uL (ref 3.87–5.11)
RDW: 13.7 % (ref 11.5–15.5)
WBC: 4.6 10*3/uL (ref 4.0–10.5)

## 2020-06-17 LAB — LIPID PANEL
Cholesterol: 217 mg/dL — ABNORMAL HIGH (ref 0–200)
HDL: 76 mg/dL (ref >39.00)
LDL Cholesterol: 126 mg/dL — ABNORMAL HIGH (ref 0–99)
NonHDL: 140.7
Total CHOL/HDL Ratio: 3
Triglycerides: 74 mg/dL (ref 0.0–149.0)
VLDL: 14.8 mg/dL (ref 0.0–40.0)

## 2020-06-17 LAB — HEPATIC FUNCTION PANEL
ALT: 16 U/L (ref 0–35)
AST: 19 U/L (ref 0–37)
Albumin: 4.2 g/dL (ref 3.5–5.2)
Alkaline Phosphatase: 120 U/L — ABNORMAL HIGH (ref 39–117)
Bilirubin, Direct: 0.1 mg/dL (ref 0.0–0.3)
Total Bilirubin: 0.6 mg/dL (ref 0.2–1.2)
Total Protein: 7 g/dL (ref 6.0–8.3)

## 2020-06-17 LAB — TSH: TSH: 1.57 u[IU]/mL (ref 0.35–4.50)

## 2020-06-17 LAB — HEMOGLOBIN A1C: Hgb A1c MFr Bld: 5.9 % (ref 4.6–6.5)

## 2020-06-19 NOTE — Progress Notes (Signed)
Labs ok  but  very slightly abnormal alk phos again . Plan    lab  alkaline phosphatase , GammaGT, Vit D in 4-6 months .

## 2020-06-23 ENCOUNTER — Telehealth: Payer: Self-pay | Admitting: Internal Medicine

## 2020-06-23 NOTE — Telephone Encounter (Signed)
Patient called back for her lab results.  Please call her back on [phone]

## 2020-06-23 NOTE — Telephone Encounter (Addendum)
See lab results from 06/20/2020. Results were discussed with patient today on 06/23/2020

## 2020-06-23 NOTE — Addendum Note (Signed)
Addended by: Agnes Lawrence on: 06/23/2020 10:42 AM   Modules accepted: Orders

## 2020-06-24 ENCOUNTER — Encounter: Payer: Self-pay | Admitting: Nurse Practitioner

## 2020-06-24 ENCOUNTER — Ambulatory Visit: Payer: Medicare PPO | Admitting: Nurse Practitioner

## 2020-06-24 ENCOUNTER — Other Ambulatory Visit: Payer: Self-pay

## 2020-06-24 VITALS — BP 114/68 | HR 68 | Resp 16 | Wt 184.0 lb

## 2020-06-24 DIAGNOSIS — N898 Other specified noninflammatory disorders of vagina: Secondary | ICD-10-CM

## 2020-06-24 LAB — WET PREP FOR TRICH, YEAST, CLUE

## 2020-06-24 MED ORDER — NYSTATIN-TRIAMCINOLONE 100000-0.1 UNIT/GM-% EX OINT: 1.0000 "application " | TOPICAL_OINTMENT | Freq: Two times a day (BID) | CUTANEOUS | 1 refills | Status: DC

## 2020-06-24 NOTE — Patient Instructions (Signed)
Genitourinary Syndrome of Menopause (GSM)  Genitourinary Syndrome of menopause is a term that describes the spectrum of changes caused by the lack of estrogen in menopause. Common Signs and Symptoms Include: Vaginal dryness, irritation/burning/itching. Abnormal discharge, vaginal pressure, pain with sex, decreased sexual arousal/desire, difficulty achieving orgasm, pain with urination, urgency, incontinence of urine, recurrent bladder infections, urethral prolapse and others. Diagnosis can usually be made with history and pelvic exam. Other testing may be considered to rule out other potential abnormalities. Symptoms can be progressive and chronic. The goal of treatment is symptom relief.  There are several different approaches to improving symptoms:  Vaginal Moisturizers and lubricants: Glycerin Moisturizer (Replens), Silicone based products (Uberlube), water-based products (Restore Moisturizing Gel by [employer]), Hyaluronic Acid vaginal products (such as HYALO GYN), and natural oils such as Olive Oil, Almond Oil and Coconut Oil (Since coconut oil comes in solid preparations, you can form them into suppositories and insert into the vagina as needed). These products are Over-the-Counter (OTC) at Pharmacies and retail stores and DO NOT contain hormones.  How to use vaginal and vulvar moisturizers . Many vaginal moisturizers come with an applicator. You will need fill the applicator with the moisturizer and then insert it carefully into your vagina. You can put lubricant on the tip of the applicator to make it easier to insert into your vagina. . You can also use vaginal moisturizers on your vulvar tissues, including your inner and outer labia (the folds of skin around your vagina). To put these moisturizers on your vulva, put a small amount (pea or grape size) of moisturizer on your finger. Then, massage the moisturizer into your vaginal opening and onto your labia. . If you recently finished  cancer treatment, or are going through sudden menopause, you may need to use the moisturizers 3 to 5 times a week to relieve your symptoms. . Vaginal and vulvar moisturizers should be used before you go to bed, so the product can be fully absorbed.  Lifestyle modifications: smoking cessation, pelvic floor physical therapy, Kegel's exercises, vaginal dilators  Non-hormonal therapies: Lidocaine (topical anesthetic) to decrease sensitivity, Oral Ospemifeme (requires prescription), Laser therapy (Pros and Cons should be discussed with provider)  Hormones Therapies: For moderate to severe GSM, vaginal estrogen is considered the most effective treatment. It can be used in combination with vaginal moisturizers and lubricants. Estrogen is delivered in small doses in the vagina with various preparations available including creams, rings, and tablets. Estrogen therapy may be contraindicated with certain hormone sensitive cancers. Vaginal DHEA and Testosterone have shown effectiveness in some cases to help with relieving vaginal atrophy and have shown mixed results with libido. Risks and benefits should be discussed prior to starting therapy.  

## 2020-06-24 NOTE — Progress Notes (Signed)
GYNECOLOGY  VISIT  CC:  Vaginal itching and burning  HPI: 65 y.o. G0P0000 Divorced White or Caucasian female here for vaginal itching & burning. Symptoms started 2-3 days ago. Has used Vagisil and steroid cream externally and has helped a bit but concerned about yeast since she took Amoxicillin recently prior to dental procedure.   Denies bleeding. Has suffered with vaginal dryness for a couple of years. Has tried different OTC preparations but finds them messy.  Has her annual exam scheduled next month  GYNECOLOGIC HISTORY: Patient's last menstrual period was 11/18/2004. Contraception: post menopausal Menopausal hormone therapy: none  Patient Active Problem List   Diagnosis Date Noted  . Elevated alkaline phosphatase measurement 09/26/2014  . Renal cyst, left 12/31/2013  . Osteopenia 12/31/2013  . Unspecified vitamin D deficiency 12/31/2013  . Postmenopausal atrophic vaginitis 12/31/2013  . Family history of colon cancer 10/02/2013  . Wrist pain, right 05/13/2013  . Hx of fall 05/13/2013  . Hypercholesteremia 05/13/2013  . History of kidney stones   . Musculoskeletal leg pain 02/02/2013  . Gait abnormality 02/02/2013  . Chest discomfort 05/28/2011  . Left wrist injury 12/01/2010  . Renal cyst 12/01/2010  . Cough 09/27/2010  . Neck pain 08/29/2010  . Tingling sensation 08/29/2010  . Allergic rhinitis, seasonal 08/29/2010  . LEG PAIN, RIGHT 07/01/2009  . VARICOSE VEINS LOWER EXTREMITIES W/OTH COMPS 12/06/2008  . BACK PAIN, THORACIC REGION 12/06/2008  . BACK PAIN 11/10/2008  . BRACHIAL NEURITIS OR RADICULITIS NOS 10/29/2008  . Irritable bowel syndrome 04/23/2007  . HYPERLIPIDEMIA NEC/NOS 01/10/2007  . DEPRESSION 01/08/2007  . GERD 01/08/2007  . Diverticulosis of colon (without mention of hemorrhage) 07/28/2002    Past Medical History:  Diagnosis Date  . Arthritis   . Bunion    left foot  . Depression    stable on medication  . Diverticulitis 2015  .  Diverticulitis of colon without hemorrhage 09/28/2013  . Diverticulosis   . Gallstones   . GERD (gastroesophageal reflux disease)   . Hammer toes of both feet   . Hyperlipidemia    no meds needed  . IBS (irritable bowel syndrome)   . Onychomycosis of toenail   . Osteopenia   . PUD (peptic ulcer disease) 1980's  . Renal cyst    4.5-6 cm stable single  seen by uro in past    Past Surgical History:  Procedure Laterality Date  . CHOLECYSTECTOMY  1987  . COLONOSCOPY    . IRRIGATION AND DEBRIDEMENT SEBACEOUS CYST Right 3/ 2014   chest wall  . UPPER GASTROINTESTINAL ENDOSCOPY    . WISDOM TOOTH EXTRACTION  age 71 & 64   lower teeth done first and upper wisdom teeth last    MEDS:   Current Outpatient Medications on File Prior to Visit  Medication Sig Dispense Refill  . Cholecalciferol (VITAMIN D PO) Take 2,000 mg by mouth every other day.     Marland Kitchen FLUoxetine (PROZAC) 40 MG capsule Take 40 mg by mouth daily.    . Multiple Vitamins-Minerals (CENTRUM ULTRA WOMENS PO) Take 1 tablet by mouth every other day.    Marland Kitchen omeprazole (PRILOSEC) 20 MG capsule Take 1 capsule by mouth once daily 90 capsule 0  . Probiotic Product (PROBIOTIC PO) Take by mouth daily.     Current Facility-Administered Medications on File Prior to Visit  Medication Dose Route Frequency Provider Last Rate Last Admin  . 0.9 %  sodium chloride infusion  500 mL Intravenous Once Doran Stabler, MD  ALLERGIES: Patient has no known allergies.  Family History  Problem Relation Age of Onset  . Rheum arthritis Father   . Pneumonia Father        deceased  . Colon cancer Father 26  . Heart attack Father   . Breast cancer Sister 24  . Colon polyps Sister   . Breast cancer Paternal Grandmother 56  . Osteoarthritis Mother   . COPD Mother   . Pneumonia Mother   . Osteoporosis Mother   . Cancer Maternal Grandmother        ? GI  . Heart failure Maternal Grandfather   . Heart failure Paternal Grandfather   .  Esophageal cancer Neg Hx   . Rectal cancer Neg Hx   . Stomach cancer Neg Hx      Review of Systems  Constitutional: Negative.   HENT: Negative.   Eyes: Negative.   Respiratory: Negative.   Cardiovascular: Negative.   Gastrointestinal: Negative.   Endocrine: Negative.   Genitourinary:       Vaginal itching & burning  Musculoskeletal: Negative.   Skin: Negative.   Allergic/Immunologic: Negative.   Neurological: Negative.   Hematological: Negative.   Psychiatric/Behavioral: Negative.     PHYSICAL EXAMINATION:    BP 114/68   Pulse 68   Resp 16   Wt 184 lb (83.5 kg)   LMP 11/18/2004   BMI 30.62 kg/m     General appearance: alert, cooperative, no acute distress Lymph:  no inguinal LAD noted  Pelvic: External genitalia: mild erythema noted at introitus              Urethra:  normal appearing urethra with no masses, tenderness or lesions              Bartholins and Skenes: normal                 Vagina: normal appearing vagina age appropriate with thin tissue, normal appearing discharge               Chaperone, Joy, CMA, was present for exam.  Assessment: Genitourinary Syndrome of Menopause Vaginal irriation  Plan: Advised no yeast identified on wet mount and that is consistent with what was seen clincially (no redness in vagina, no edema, no abnormal discharge)  Discussed estrogen vaginal cream, pt wants to look into more and discuss with Dr. Talbert Nan at annual appointment next month   Will try Mycolog twice per day x 1-2 weeks, may use vaseline  Information provided about GSM in after visit summary.

## 2020-07-31 ENCOUNTER — Other Ambulatory Visit: Payer: Self-pay | Admitting: Gastroenterology

## 2020-08-05 DIAGNOSIS — F4323 Adjustment disorder with mixed anxiety and depressed mood: Secondary | ICD-10-CM | POA: Diagnosis not present

## 2020-08-08 DIAGNOSIS — H31091 Other chorioretinal scars, right eye: Secondary | ICD-10-CM | POA: Diagnosis not present

## 2020-08-08 DIAGNOSIS — H04123 Dry eye syndrome of bilateral lacrimal glands: Secondary | ICD-10-CM | POA: Diagnosis not present

## 2020-08-08 DIAGNOSIS — H40053 Ocular hypertension, bilateral: Secondary | ICD-10-CM | POA: Diagnosis not present

## 2020-08-08 DIAGNOSIS — H2513 Age-related nuclear cataract, bilateral: Secondary | ICD-10-CM | POA: Diagnosis not present

## 2020-08-08 NOTE — Progress Notes (Signed)
65 y.o. G0P0000 Divorced White or Caucasian Not Hispanic or Latino female here for annual exam.   She c/o vaginal and vulvar itching and dryness. She had a negative wet prep last month. Slightly worsening over time, has been an issue for years.  No vaginal bleeding. No incontinence. Normal bowel and bladder function.     Patient's last menstrual period was 11/18/2004.          Sexually active: No.  The current method of family planning is post menopausal status.    Exercising: Yes.    walking  Smoker:  no  Health Maintenance: Pap:  11/29/2017 WNL NEG HPV, 01-04-15 negative, HR HPV negative  History of abnormal Pap:  no MMG:  05/09/20 Bi-Rads cat 1: negative; 03/12/2018 Birads 1 negative BMD:   03/18/19 stable osteopenia of spine of -2.3, normal hip, FRAX 8/0.7%; 03-07-17 osteopenia, Frax 7.6%/0.5% Colonoscopy: 03/28/2018 diverticulosis, every 5 years secondary to Father with colon cancer.   TDaP:  05/13/13 Gardasil: none   reports that she has never smoked. She has never used smokeless tobacco. She reports that she does not drink alcohol and does not use drugs. She retired in 1/21. 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   Past Medical History:  Diagnosis Date  . Arthritis   . Bunion    left foot  . Depression    stable on medication  . Diverticulitis 2015  . Diverticulitis of colon without hemorrhage 09/28/2013  . Diverticulosis   . Gallstones   . GERD (gastroesophageal reflux disease)   . Hammer toes of both feet   . Hyperlipidemia    no meds needed  . IBS (irritable bowel syndrome)   . Onychomycosis of toenail   . Osteopenia   . PUD (peptic ulcer disease) 1980's  . Renal cyst    4.5-6 cm stable single  seen by uro in past    Past Surgical History:  Procedure Laterality Date  . CHOLECYSTECTOMY  1987  . COLONOSCOPY    . IRRIGATION AND DEBRIDEMENT SEBACEOUS CYST Right 3/ 2014   chest wall  . UPPER GASTROINTESTINAL ENDOSCOPY    . WISDOM TOOTH EXTRACTION  age 51 & 72    lower teeth done first and upper wisdom teeth last    Current Outpatient Medications  Medication Sig Dispense Refill  . Cholecalciferol (VITAMIN D PO) Take 2,000 mg by mouth every other day.     Marland Kitchen FLUoxetine (PROZAC) 40 MG capsule Take 40 mg by mouth daily.    . Multiple Vitamins-Minerals (CENTRUM ULTRA WOMENS PO) Take 1 tablet by mouth every other day.    . nystatin-triamcinolone ointment (MYCOLOG) Apply 1 application topically 2 (two) times daily. 30 g 1  . omeprazole (PRILOSEC) 20 MG capsule Take 1 capsule by mouth once daily 90 capsule 1  . Probiotic Product (PROBIOTIC PO) Take by mouth daily.     Current Facility-Administered Medications  Medication Dose Route Frequency Provider Last Rate Last Admin  . 0.9 %  sodium chloride infusion  500 mL Intravenous Once Doran Stabler, MD        Family History  Problem Relation Age of Onset  . Rheum arthritis Father   . Pneumonia Father        deceased  . Colon cancer Father 38  . Heart attack Father   . Breast cancer Sister 58  . Colon polyps Sister   . Breast cancer Paternal Grandmother 64  . Osteoarthritis Mother   . COPD Mother   . Pneumonia Mother   .  Osteoporosis Mother   . Cancer Maternal Grandmother        ? GI  . Heart failure Maternal Grandfather   . Heart failure Paternal Grandfather   . Esophageal cancer Neg Hx   . Rectal cancer Neg Hx   . Stomach cancer Neg Hx     Review of Systems  All other systems reviewed and are negative.   Exam:   BP 120/82   Pulse 65   Ht 5\' 5"  (1.651 m)   Wt 185 lb (83.9 kg)   LMP 11/18/2004   SpO2 99%   BMI 30.79 kg/m   Weight change: @WEIGHTCHANGE @ Height:   Height: 5\' 5"  (165.1 cm)  Ht Readings from Last 3 Encounters:  08/10/20 5\' 5"  (1.651 m)  06/01/20 5\' 5"  (1.651 m)  05/25/20 5\' 5"  (1.651 m)    General appearance: alert, cooperative and appears stated age Head: Normocephalic, without obvious abnormality, atraumatic Neck: no adenopathy, supple, symmetrical, trachea  midline and thyroid normal to inspection and palpation Lungs: clear to auscultation bilaterally Cardiovascular: regular rate and rhythm Breasts: normal appearance, no masses or tenderness Abdomen: soft, non-tender; non distended,  no masses,  no organomegaly Extremities: extremities normal, atraumatic, no cyanosis or edema Skin: Skin color, texture, turgor normal. No rashes or lesions Lymph nodes: Cervical, supraclavicular, and axillary nodes normal. No abnormal inguinal nodes palpated Neurologic: Grossly normal   Pelvic: External genitalia:  no lesions, mildly atrophic. No lesions, no whitening, no agglutination, no fissures.               Urethra:  normal appearing urethra with no masses, tenderness or lesions              Bartholins and Skenes: normal                 Vagina: atrophic appearing vagina with normal color and discharge, no lesions              Cervix: no lesions               Bimanual Exam:  Uterus:  normal size, contour, position, consistency, mobility, non-tender              Adnexa: no mass, fullness, tenderness               Rectovaginal: Confirms               Anus:  normal sphincter tone, no lesions  Karmen Bongo chaperoned for the exam.   1. Encounter for gynecological examination without abnormal finding No pap this year Mammogram and colonoscopy UTD Labs with primary Discussed breast self exam Discussed calcium and vit D intake   2. History of osteopenia Exercising, getting calcium and vit d - DG Bone Density; Future  3. Vaginal and vulvar atrophy  - estradiol (ESTRACE) 0.1 MG/GM vaginal cream; 1 gram vaginally every night for one week, then switch to twice weekly. Also place a pea sized amount topically.  Dispense: 42.5 g; Refill: 1

## 2020-08-10 ENCOUNTER — Other Ambulatory Visit: Payer: Self-pay

## 2020-08-10 ENCOUNTER — Encounter: Payer: Self-pay | Admitting: Obstetrics and Gynecology

## 2020-08-10 ENCOUNTER — Ambulatory Visit: Payer: Medicare PPO | Admitting: Obstetrics and Gynecology

## 2020-08-10 VITALS — BP 120/82 | HR 65 | Ht 65.0 in | Wt 185.0 lb

## 2020-08-10 DIAGNOSIS — N952 Postmenopausal atrophic vaginitis: Secondary | ICD-10-CM | POA: Diagnosis not present

## 2020-08-10 DIAGNOSIS — Z01419 Encounter for gynecological examination (general) (routine) without abnormal findings: Secondary | ICD-10-CM | POA: Diagnosis not present

## 2020-08-10 DIAGNOSIS — Z8739 Personal history of other diseases of the musculoskeletal system and connective tissue: Secondary | ICD-10-CM

## 2020-08-10 MED ORDER — ESTRADIOL 0.1 MG/GM VA CREA: TOPICAL_CREAM | VAGINAL | 1 refills | Status: DC

## 2020-08-10 NOTE — Patient Instructions (Signed)
EXERCISE   We recommended that you start or continue a regular exercise program for good health. Physical activity is anything that gets your body moving, some is better than none. The CDC recommends 150 minutes per week of Moderate-Intensity Aerobic Activity and 2 or more days of Muscle Strengthening Activity.  Benefits of exercise are limitless: helps weight loss/weight maintenance, improves mood and energy, helps with depression and anxiety, improves sleep, tones and strengthens muscles, improves balance, improves bone density, protects from chronic conditions such as heart disease, high blood pressure and diabetes and so much more. To learn more visit: https://www.cdc.gov/physicalactivity/index.html  DIET: Good nutrition starts with a healthy diet of fruits, vegetables, whole grains, and lean protein sources. Drink plenty of water for hydration. Minimize empty calories, sodium, sweets. For more information about dietary recommendations visit: https://health.gov/our-work/nutrition-physical-activity/dietary-guidelines and https://www.myplate.gov/  ALCOHOL:  Women should limit their alcohol intake to no more than 7 drinks/beers/glasses of wine (combined, not each!) per week. Moderation of alcohol intake to this level decreases your risk of breast cancer and liver damage.  If you are concerned that you may have a problem, or your friends have told you they are concerned about your drinking, there are many resources to help. A well-known program that is free, effective, and available to all people all over the nation is Alcoholics Anonymous.  Check out this site to learn more: https://www.aa.org/   CALCIUM AND VITAMIN D:  Adequate intake of calcium and Vitamin D are recommended for bone health.  You should be getting between 1000-1200 mg of calcium and 800 units of Vitamin D daily between diet and supplements  PAP SMEARS:  Pap smears, to check for cervical cancer or precancers,  have traditionally been  done yearly, scientific advances have shown that most women can have pap smears less often.  However, every woman still should have a physical exam from her gynecologist every year. It will include a breast check, inspection of the vulva and vagina to check for abnormal growths or skin changes, a visual exam of the cervix, and then an exam to evaluate the size and shape of the uterus and ovaries. We will also provide age appropriate advice regarding health maintenance, like when you should have certain vaccines, screening for sexually transmitted diseases, bone density testing, colonoscopy, mammograms, etc.   MAMMOGRAMS:  All women over 40 years old should have a routine mammogram.   COLON CANCER SCREENING: Now recommend starting at age 45. At this time colonoscopy is not covered for routine screening until 50. There are take home tests that can be done between 45-49.   COLONOSCOPY:  Colonoscopy to screen for colon cancer is recommended for all women at age 50.  We know, you hate the idea of the prep.  We agree, BUT, having colon cancer and not knowing it is worse!!  Colon cancer so often starts as a polyp that can be seen and removed at colonscopy, which can quite literally save your life!  And if your first colonoscopy is normal and you have no family history of colon cancer, most women don't have to have it again for 10 years.  Once every ten years, you can do something that may end up saving your life, right?  We will be happy to help you get it scheduled when you are ready.  Be sure to check your insurance coverage so you understand how much it will cost.  It may be covered as a preventative service at no cost, but you should check   your particular policy.      Breast Self-Awareness Breast self-awareness means being familiar with how your breasts look and feel. It involves checking your breasts regularly and reporting any changes to your health care provider. Practicing breast self-awareness is  important. A change in your breasts can be a sign of a serious medical problem. Being familiar with how your breasts look and feel allows you to find any problems early, when treatment is more likely to be successful. All women should practice breast self-awareness, including women who have had breast implants. How to do a breast self-exam One way to learn what is normal for your breasts and whether your breasts are changing is to do a breast self-exam. To do a breast self-exam: Look for Changes  1. Remove all the clothing above your waist. 2. Stand in front of a mirror in a room with good lighting. 3. Put your hands on your hips. 4. Push your hands firmly downward. 5. Compare your breasts in the mirror. Look for differences between them (asymmetry), such as: ? Differences in shape. ? Differences in size. ? Puckers, dips, and bumps in one breast and not the other. 6. Look at each breast for changes in your skin, such as: ? Redness. ? Scaly areas. 7. Look for changes in your nipples, such as: ? Discharge. ? Bleeding. ? Dimpling. ? Redness. ? A change in position. Feel for Changes Carefully feel your breasts for lumps and changes. It is best to do this while lying on your back on the floor and again while sitting or standing in the shower or tub with soapy water on your skin. Feel each breast in the following way:  Place the arm on the side of the breast you are examining above your head.  Feel your breast with the other hand.  Start in the nipple area and make  inch (2 cm) overlapping circles to feel your breast. Use the pads of your three middle fingers to do this. Apply light pressure, then medium pressure, then firm pressure. The light pressure will allow you to feel the tissue closest to the skin. The medium pressure will allow you to feel the tissue that is a little deeper. The firm pressure will allow you to feel the tissue close to the ribs.  Continue the overlapping circles,  moving downward over the breast until you feel your ribs below your breast.  Move one finger-width toward the center of the body. Continue to use the  inch (2 cm) overlapping circles to feel your breast as you move slowly up toward your collarbone.  Continue the up and down exam using all three pressures until you reach your armpit.  Write Down What You Find  Write down what is normal for each breast and any changes that you find. Keep a written record with breast changes or normal findings for each breast. By writing this information down, you do not need to depend only on memory for size, tenderness, or location. Write down where you are in your menstrual cycle, if you are still menstruating. If you are having trouble noticing differences in your breasts, do not get discouraged. With time you will become more familiar with the variations in your breasts and more comfortable with the exam. How often should I examine my breasts? Examine your breasts every month. If you are breastfeeding, the best time to examine your breasts is after a feeding or after using a breast pump. If you menstruate, the best time to   examine your breasts is 5-7 days after your period is over. During your period, your breasts are lumpier, and it may be more difficult to notice changes. When should I see my health care provider? See your health care provider if you notice:  A change in shape or size of your breasts or nipples.  A change in the skin of your breast or nipples, such as a reddened or scaly area.  Unusual discharge from your nipples.  A lump or thick area that was not there before.  Pain in your breasts.  Anything that concerns you.  Atrophic Vaginitis  Atrophic vaginitis is a condition in which the tissues that line the vagina become dry and thin. This condition is most common in women who have stopped having regular menstrual periods (are in menopause). This usually starts when a woman is 22 to 66  years old. That is the time when a woman's estrogen levels begin to decrease. Estrogen is a female hormone. It helps to keep the tissues of the vagina moist. It stimulates the vagina to produce a clear fluid that lubricates the vagina for sex. This fluid also protects the vagina from infection. Lack of estrogen can cause the lining of the vagina to get thinner and dryer. The vagina may also shrink in size. It may become less elastic. Atrophic vaginitis tends to get worse over time as a woman's estrogen level drops. What are the causes? This condition is caused by the normal drop in estrogen that happens around the time of menopause. What increases the risk? Certain conditions or situations may lower a woman's estrogen level, leading to a higher risk for atrophic vaginitis. You are more likely to develop this condition if:  You are taking medicines that block estrogen.  You have had your ovaries removed.  You are being treated for cancer with radiation or medicines (chemotherapy).  You have given birth or are breastfeeding.  You are older than age 80.  You smoke. What are the signs or symptoms? Symptoms of this condition include:  Pain, soreness, a feeling of pressure, or bleeding during sex (dyspareunia).  Vaginal burning, irritation, or itching.  Pain or bleeding when a speculum is used in a vaginal exam.  Having burning pain while urinating.  Vaginal discharge. In some cases, there are no symptoms. How is this diagnosed? This condition is diagnosed based on your medical history and a physical exam. This will include a pelvic exam that checks the vaginal tissues. Though rare, you may also have other tests, including:  A urine test.  A test that checks the acid balance in your vagina (acid balance test). How is this treated? Treatment for this condition depends on how severe your symptoms are. Treatment may include:  Using an over-the-counter vaginal lubricant before  sex.  Using a long-acting vaginal moisturizer.  Using low-dose estrogen for moderate to severe symptoms that do not respond to other treatments. Options include creams, tablets, and inserts (vaginal rings). Before you use a vaginal estrogen, tell your health care provider if you have a history of: ? Breast cancer. ? Endometrial cancer. ? Blood clots. If you are not sexually active and your symptoms are very mild, you may not need treatment. Follow these instructions at home: Medicines  Take over-the-counter and prescription medicines only as told by your health care provider.  Do not use herbal or alternative medicines unless your health care provider says that you can.  Use over-the-counter creams, lubricants, or moisturizers for dryness only as  told by your health care provider. General instructions  If your atrophic vaginitis is caused by menopause, discuss all of your menopause symptoms and treatment options with your health care provider.  Do not douche.  Do not use products that can make your vagina dry. These include: ? Scented feminine sprays. ? Scented tampons. ? Scented soaps.  Vaginal sex can help to improve blood flow and elasticity of vaginal tissue. If you choose to have sex and it hurts, try using a water-soluble lubricant or moisturizer right before having sex. Contact a health care provider if:  Your discharge looks different than normal.  Your vagina has an unusual smell.  You have new symptoms.  Your symptoms do not improve with treatment.  Your symptoms get worse. Summary  Atrophic vaginitis is a condition in which the tissues that line the vagina become dry and thin. It is most common in women who have stopped having regular menstrual periods (are in menopause).  Treatment options include using vaginal lubricants and low-dose vaginal estrogen.  Contact a health care provider if your vagina has an unusual smell, or if your symptoms get worse or do not  improve after treatment. This information is not intended to replace advice given to you by your health care provider. Make sure you discuss any questions you have with your health care provider. Document Revised: 11/05/2019 Document Reviewed: 11/05/2019 Elsevier Patient Education  [place].

## 2020-08-24 DIAGNOSIS — R599 Enlarged lymph nodes, unspecified: Secondary | ICD-10-CM | POA: Diagnosis not present

## 2020-09-06 DIAGNOSIS — F411 Generalized anxiety disorder: Secondary | ICD-10-CM | POA: Diagnosis not present

## 2020-09-06 DIAGNOSIS — F331 Major depressive disorder, recurrent, moderate: Secondary | ICD-10-CM | POA: Diagnosis not present

## 2020-09-13 DIAGNOSIS — F3341 Major depressive disorder, recurrent, in partial remission: Secondary | ICD-10-CM | POA: Diagnosis not present

## 2020-10-07 DIAGNOSIS — F3341 Major depressive disorder, recurrent, in partial remission: Secondary | ICD-10-CM | POA: Diagnosis not present

## 2020-10-14 DIAGNOSIS — N281 Cyst of kidney, acquired: Secondary | ICD-10-CM | POA: Diagnosis not present

## 2020-10-20 ENCOUNTER — Other Ambulatory Visit: Payer: Self-pay

## 2020-10-21 ENCOUNTER — Other Ambulatory Visit (INDEPENDENT_AMBULATORY_CARE_PROVIDER_SITE_OTHER): Payer: Medicare PPO

## 2020-10-21 DIAGNOSIS — R945 Abnormal results of liver function studies: Secondary | ICD-10-CM | POA: Diagnosis not present

## 2020-10-21 DIAGNOSIS — R7989 Other specified abnormal findings of blood chemistry: Secondary | ICD-10-CM

## 2020-10-21 LAB — VITAMIN D 25 HYDROXY (VIT D DEFICIENCY, FRACTURES): VITD: 35.63 ng/mL (ref 30.00–100.00)

## 2020-10-24 LAB — GAMMA GT: GGT: 11 U/L (ref 7–51)

## 2020-10-24 LAB — ALKALINE PHOSPHATASE: Alkaline Phosphatase: 117 U/L (ref 39–117)

## 2020-10-30 NOTE — Progress Notes (Signed)
Lab back to normal     take vit D supplementation

## 2020-11-01 ENCOUNTER — Other Ambulatory Visit: Payer: Self-pay

## 2020-11-01 NOTE — Progress Notes (Signed)
Chief Complaint  Patient presents with   Abdominal Pain    Patient complains of pain on right side abdominal pain, x1 month, Denies any known injury, Patient reported indigestion with pain     HPI: [name] [name] 65 y.o. come in for  concern about side pain.  Not as bad today  Pain right side and indigestion    took tums in case  esophagus sx. symptoms have been going on About a month  Had different cereal it may have caused some minor constipation but back to normal. Pain is about 6/10 when bothering her .  Not nocturnal  not sure if related to eating  last for days. ? Nausea no vomiting or diarrhea She was seen earlier last month by the urologist who is following a large cyst on her left kidney was reported to have an ultrasound and urinalysis and things are being followed She has a remote history of cholecystectomy when she was much younger pain in the same area No fever sweats unintentional weight loss injuries respiratory symptoms or referred pain to the shoulder. ROS: See pertinent positives and negatives per HPI. To get a bone density through her GYN Past Medical History:  Diagnosis Date   Arthritis    Bunion    left foot   Depression    stable on medication   Diverticulitis 2015   Diverticulitis of colon without hemorrhage 09/28/2013   Diverticulosis    Gallstones    GERD (gastroesophageal reflux disease)    Hammer toes of both feet    Hyperlipidemia    no meds needed   IBS (irritable bowel syndrome)    Onychomycosis of toenail    Osteopenia    PUD (peptic ulcer disease) 1980's   Renal cyst    4.5-6 cm stable single  seen by uro in past    Family History  Problem Relation Age of Onset   Rheum arthritis Father    Pneumonia Father        deceased   Colon cancer Father 33   Heart attack Father    Breast cancer Sister 34   Colon polyps Sister    Breast cancer Paternal Grandmother 30   Osteoarthritis Mother    COPD Mother    Pneumonia Mother     Osteoporosis Mother    Cancer Maternal Grandmother        ? GI   Heart failure Maternal Grandfather    Heart failure Paternal Grandfather    Esophageal cancer Neg Hx    Rectal cancer Neg Hx    Stomach cancer Neg Hx     Social History   Socioeconomic History   Marital status: Divorced    Spouse name: Not on file   Number of children: 0   Years of education: Not on file   Highest education level: Not on file  Occupational History   Occupation: TEACHER    Employer: [employer]  Tobacco Use   Smoking status: Never   Smokeless tobacco: Never  Vaping Use   Vaping Use: Never used  Substance and Sexual Activity   Alcohol use: No   Drug use: No   Sexual activity: Not Currently    Birth control/protection: Post-menopausal  Other Topics Concern   Not on file  Social History Narrative   Teacher media specialist graduate degree Oncologist.   Non smoker    Hh of 1 ... 2 cats    Single   G0P0   Mom is now  in assisted living and she has moved into her old residence   Social Determinants of Radio broadcast assistant Strain: Not on file  Food Insecurity: Not on file  Transportation Needs: Not on file  Physical Activity: Not on file  Stress: Not on file  Social Connections: Not on file    Outpatient Medications Prior to Visit  Medication Sig Dispense Refill   Cholecalciferol (VITAMIN D PO) Take 2,000 mg by mouth every other day.      estradiol (ESTRACE) 0.1 MG/GM vaginal cream 1 gram vaginally every night for one week, then switch to twice weekly. Also place a pea sized amount topically. 42.5 g 1   FLUoxetine (PROZAC) 40 MG capsule Take 40 mg by mouth daily.     Multiple Vitamins-Minerals (CENTRUM ULTRA WOMENS PO) Take 1 tablet by mouth every other day.     nystatin-triamcinolone ointment (MYCOLOG) Apply 1 application topically 2 (two) times daily. 30 g 1   omeprazole (PRILOSEC) 20 MG capsule Take 1 capsule by mouth once daily 90 capsule 1   Probiotic Product  (PROBIOTIC PO) Take by mouth daily.     Facility-Administered Medications Prior to Visit  Medication Dose Route Frequency Provider Last Rate Last Admin   0.9 %  sodium chloride infusion  500 mL Intravenous Once Danis, Estill Cotta III, MD         EXAM:  BP 130/66 (BP Location: Left Arm, Patient Position: Sitting, Cuff Size: Normal)   Pulse 72   Temp 98.2 F (36.8 C) (Oral)   Ht 5\' 5"  (1.651 m)   Wt 184 lb 9.6 oz (83.7 kg)   LMP 11/18/2004   SpO2 96%   BMI 30.72 kg/m   Body mass index is 30.72 kg/m.  GENERAL: vitals reviewed and listed above, alert, oriented, appears well hydrated and in no acute distress HEENT: atraumatic, conjunctiva  clear, no obvious abnormalities on inspection of external nose and ears OP : masked  NECK: no obvious masses on inspection palpation  LUNGS: clear to auscultation bilaterally, no wheezes, rales or rhonchi, good air movement CV: HRRR, no clubbing cyanosis or  peripheral edema nl cap refill  Abdomen without obvious organomegaly some tenderness at the rib cage right upper quadrant but no point tenderness sharp liver edge noted. MS: moves all extremities without noticeable focal  abnormality varicose veins no acute finding Skin nonicteric. PSYCH: pleasant and cooperative, no obvious depression or anxiety Lab Results  Component Value Date   WBC 4.6 06/17/2020   HGB 13.7 06/17/2020   HCT 41.3 06/17/2020   PLT 278.0 06/17/2020   GLUCOSE 93 06/17/2020   CHOL 217 (H) 06/17/2020   TRIG 74.0 06/17/2020   HDL 76.00 06/17/2020   LDLDIRECT 132.7 03/24/2013   LDLCALC 126 (H) 06/17/2020   ALT 16 06/17/2020   AST 19 06/17/2020   NA 141 06/17/2020   K 4.5 06/17/2020   CL 103 06/17/2020   CREATININE 0.76 06/17/2020   BUN 19 06/17/2020   CO2 32 06/17/2020   TSH 1.57 06/17/2020   HGBA1C 5.9 06/17/2020   BP Readings from Last 3 Encounters:  11/02/20 130/66  08/10/20 120/82  06/24/20 114/68    ASSESSMENT AND PLAN:  Discussed the following assessment  and plan:  RUQ pain - Plan: US Abdomen Limited RUQ (LIVER/GB) Uncertain cause of specific right upper quadrant (has had a cholecystectomy but reminiscent symptoms.)  Reviewed MRI report from 2021 there was a hepatic cyst otherwise no significant liver abnormality She has had borderline alkaline  phosphatase with normal GGT this last check borderline low vitamin D. She reports that had a renal ultrasound a month ago to follow her left renal cyst but I do not have the report or imaging of the right upper quadrant. Order liver dedicated ultrasound and we will send a message to Dr. Loletha Carrow team and she could call for an appointment to get GI opinion about her pain. Scheduling about vaccines agree with proceeding Shingrix at her pharmacy when convenient. -Patient advised to return or notify health care team  if  new concerns arise. Record review scan review data history consult 32 minutes Patient Instructions  Exam is reassuring but cannot tell what causing the sx .  Will get  limited liver US .  And will plan   Gi consult .    Last labs were normal .  You should be contacted  about these appts.   Standley Brooking. Trenten Watchman M.D.

## 2020-11-02 ENCOUNTER — Encounter: Payer: Self-pay | Admitting: Internal Medicine

## 2020-11-02 ENCOUNTER — Ambulatory Visit: Payer: Medicare PPO | Admitting: Internal Medicine

## 2020-11-02 VITALS — BP 130/66 | HR 72 | Temp 98.2°F | Ht 65.0 in | Wt 184.6 lb

## 2020-11-02 DIAGNOSIS — R1011 Right upper quadrant pain: Secondary | ICD-10-CM

## 2020-11-02 NOTE — Patient Instructions (Signed)
Exam is reassuring but cannot tell what causing the sx .  Will get  limited liver US .  And will plan   Gi consult .    Last labs were normal .  You should be contacted  about these appts.

## 2020-11-15 DIAGNOSIS — F3341 Major depressive disorder, recurrent, in partial remission: Secondary | ICD-10-CM | POA: Diagnosis not present

## 2020-11-24 ENCOUNTER — Ambulatory Visit
Admission: RE | Admit: 2020-11-24 | Discharge: 2020-11-24 | Disposition: A | Discharge status: Home / Self Care | Payer: Medicare PPO | Source: Ambulatory Visit | Attending: Internal Medicine | Admitting: Internal Medicine

## 2020-11-24 DIAGNOSIS — R1011 Right upper quadrant pain: Secondary | ICD-10-CM | POA: Diagnosis not present

## 2020-11-27 NOTE — Progress Notes (Signed)
No significant abnormality  on ultrasound . Reassuring but No explanation  of  pain.  Keep Gi appt  planned  with dr Loletha Carrow

## 2020-12-19 ENCOUNTER — Telehealth: Payer: Self-pay | Admitting: Internal Medicine

## 2020-12-19 NOTE — Telephone Encounter (Signed)
Left message for patient to call back and schedule Medicare Annual Wellness Visit (AWV) either virtually or in office.  awvi 12/19/20 per [place] please schedule at anytime with [facility] Nurse Health Advisor 1 or 2   This should be a 45 minute visit.

## 2020-12-23 ENCOUNTER — Other Ambulatory Visit: Payer: Self-pay

## 2020-12-23 ENCOUNTER — Other Ambulatory Visit (INDEPENDENT_AMBULATORY_CARE_PROVIDER_SITE_OTHER): Payer: Medicare PPO

## 2020-12-23 ENCOUNTER — Ambulatory Visit (INDEPENDENT_AMBULATORY_CARE_PROVIDER_SITE_OTHER): Payer: Medicare PPO | Admitting: Gastroenterology

## 2020-12-23 ENCOUNTER — Encounter: Payer: Self-pay | Admitting: Gastroenterology

## 2020-12-23 VITALS — BP 122/70 | HR 76 | Ht 64.5 in | Wt 182.0 lb

## 2020-12-23 DIAGNOSIS — K219 Gastro-esophageal reflux disease without esophagitis: Secondary | ICD-10-CM

## 2020-12-23 DIAGNOSIS — R1011 Right upper quadrant pain: Secondary | ICD-10-CM

## 2020-12-23 LAB — COMPREHENSIVE METABOLIC PANEL
ALT: 14 U/L (ref 0–35)
AST: 15 U/L (ref 0–37)
Albumin: 4.2 g/dL (ref 3.5–5.2)
Alkaline Phosphatase: 122 U/L — ABNORMAL HIGH (ref 39–117)
BUN: 18 mg/dL (ref 6–23)
CO2: 27 mEq/L (ref 19–32)
Calcium: 9.2 mg/dL (ref 8.4–10.5)
Chloride: 101 mEq/L (ref 96–112)
Creatinine, Ser: 0.86 mg/dL (ref 0.40–1.20)
GFR: 70.62 mL/min (ref >60.00)
Glucose, Bld: 96 mg/dL (ref 70–99)
Potassium: 4.2 mEq/L (ref 3.5–5.1)
Sodium: 137 mEq/L (ref 135–145)
Total Bilirubin: 0.5 mg/dL (ref 0.2–1.2)
Total Protein: 7.6 g/dL (ref 6.0–8.3)

## 2020-12-23 NOTE — Patient Instructions (Signed)
If you are age 66 or older, your body mass index should be between 23-30. Your Body mass index is 30.76 kg/m. If this is out of the aforementioned range listed, please consider follow up with your Primary Care Provider.  If you are age 67 or younger, your body mass index should be between 19-25. Your Body mass index is 30.76 kg/m. If this is out of the aformentioned range listed, please consider follow up with your Primary Care Provider.   __________________________________________________________  The [place] [facility] providers would like to encourage you to use [facility] to communicate with providers for non-urgent requests or questions.  Due to long hold times on the telephone, sending your provider a message by [facility] may be a faster and more efficient way to get a response.  Please allow 48 business hours for a response.  Please remember that this is for non-urgent requests.   Your provider has requested that you go to the basement level for lab work before leaving today. Press "B" on the elevator. The lab is located at the first door on the left as you exit the elevator.  Due to recent changes in healthcare laws, you may see the results of your imaging and laboratory studies on MyChart before your provider has had a chance to review them.  We understand that in some cases there may be results that are confusing or concerning to you. Not all laboratory results come back in the same time frame and the provider may be waiting for multiple results in order to interpret others.  Please give [place] 48 hours in order for your provider to thoroughly review all the results before contacting the office for clarification of your results.   You have been scheduled for a CT scan of the abdomen and pelvis at [facility] ([address]---this is in the same building as [employer]).   You are scheduled on 12-29-2020 at [facility] should arrive 15 minutes prior to your appointment time for  registration. Please follow the written instructions below on the day of your exam:  WARNING: IF YOU ARE ALLERGIC TO IODINE/X-RAY DYE, PLEASE NOTIFY RADIOLOGY IMMEDIATELY AT [phone]! YOU WILL BE GIVEN A 13 HOUR PREMEDICATION PREP.  1) Do not eat or drink anything after 12/noon (4 hours prior to your test) 2) You have been given 2 bottles of oral contrast to drink. The solution may taste better if refrigerated, but do NOT add ice or any other liquid to this solution. Shake well before drinking.    Drink 1 bottle of contrast @ 2pm (2 hours prior to your exam)  Drink 1 bottle of contrast @ 3pm (1 hour prior to your exam)  You may take any medications as prescribed with a small amount of water, if necessary. If you take any of the following medications: METFORMIN, GLUCOPHAGE, GLUCOVANCE, AVANDAMET, RIOMET, FORTAMET, [address] MET, JANUMET, GLUMETZA or METAGLIP, you MAY be asked to HOLD this medication 48 hours AFTER the exam.  The purpose of you drinking the oral contrast is to aid in the visualization of your intestinal tract. The contrast solution may cause some diarrhea. Depending on your individual set of symptoms, you may also receive an intravenous injection of x-ray contrast/dye. Plan on being at [facility] for 30 minutes or longer, depending on the type of exam you are having performed.  This test typically takes 30-45 minutes to complete.  If you have any questions regarding your exam or if you need to reschedule,  you may call the CT department at [phone] between the hours of 8:00 am and 5:00 pm, Monday-Friday.  ________________________________________________________________________    It was a pleasure to see you today!  Thank you for trusting me with your gastrointestinal care!

## 2020-12-23 NOTE — Progress Notes (Signed)
[facility] Progress Note  Chief Complaint: Right upper quadrant pain  Subjective  History: [name] [name] was last seen on 05/25/2020 for reflux symptoms, at which time she was also scribing some intermittent burning right upper quadrant discomfort. Last colonoscopy with me in November 2019, last upper endoscopy report on file is from 2008.  [name] [name] says her reflux symptoms are stable as long she takes her omeprazole and avoids offending foods.  Occasionally she will have nighttime symptoms of heartburn or regurgitation if she eats too late in the evening or something like spaghetti sauce.  Over the last several months she has had intermittent right upper quadrant pain toward the front and around to the side, no clear triggers or relieving factors.  It varies from the dull ache to a sharp discomfort that is usually brief.  Bowel habits are regular, no rectal bleeding. Last CT scan abdomen May 2015 in setting of mild left-sided diverticulitis also incidentally noted colon interposition between the liver and the diaphragm. ROS: Cardiovascular:  no chest pain Respiratory: no dyspnea  The patient's Past Medical, Family and Social History were reviewed and are on file in the EMR.  Objective:  Med list reviewed  Current Outpatient Medications:    Cholecalciferol (VITAMIN D PO), Take 2,000 mg by mouth every other day. , Disp: , Rfl:    FLUoxetine (PROZAC) 40 MG capsule, Take 40 mg by mouth daily., Disp: , Rfl:    Multiple Vitamins-Minerals (CENTRUM ULTRA WOMENS PO), Take 1 tablet by mouth every other day., Disp: , Rfl:    nystatin-triamcinolone ointment (MYCOLOG), Apply 1 application topically 2 (two) times daily., Disp: 30 g, Rfl: 1   omeprazole (PRILOSEC) 20 MG capsule, Take 1 capsule by mouth once daily, Disp: 90 capsule, Rfl: 1   Probiotic Product (PROBIOTIC PO), Take by mouth daily., Disp: , Rfl:   Current Facility-Administered Medications:    0.9 %  sodium chloride infusion, 500 mL,  Intravenous, Once, Danis, Estill Cotta III, MD   Vital signs in last 24 hrs: Vitals:   12/23/20 1417  BP: 122/70  Pulse: 76   Wt Readings from Last 3 Encounters:  12/23/20 182 lb (82.6 kg)  11/02/20 184 lb 9.6 oz (83.7 kg)  08/10/20 185 lb (83.9 kg)    Physical Exam  Well-appearing HEENT: sclera anicteric, oral mucosa moist without lesions Neck: supple, no thyromegaly, JVD or lymphadenopathy Cardiac: RRR without murmurs, S1S2 heard, no peripheral edema Pulm: clear to auscultation bilaterally, normal RR and effort noted Abdomen: soft, no tenderness, with active bowel sounds. No guarding or palpable hepatosplenomegaly. Skin; warm and dry, no jaundice or rash  Labs:  CBC Latest Ref Rng & Units 06/17/2020 05/25/2019 12/27/2017  WBC 4.0 - 10.5 K/uL 4.6 5.1 5.9  Hemoglobin 12.0 - 15.0 g/dL 13.7 13.2 13.2  Hematocrit 36.0 - 46.0 % 41.3 40.0 39.1  Platelets 150.0 - 400.0 K/uL 278.0 280.0 263.0   CMP Latest Ref Rng & Units 10/21/2020 06/17/2020 05/25/2019  Glucose 70 - 99 mg/dL - 93 100(H)  BUN 6 - 23 mg/dL - 19 16  Creatinine 0.40 - 1.20 mg/dL - 0.76 0.65  Sodium 135 - 145 mEq/L - 141 138  Potassium 3.5 - 5.1 mEq/L - 4.5 4.4  Chloride 96 - 112 mEq/L - 103 101  CO2 19 - 32 mEq/L - 32 28  Calcium 8.4 - 10.5 mg/dL - 9.7 9.4  Total Protein 6.0 - 8.3 g/dL - 7.0 6.9  Total Bilirubin 0.2 - 1.2 mg/dL - 0.6  0.5  Alkaline Phos 39 - 117 U/L 117 120(H) 117  AST 0 - 37 U/L - 19 17  ALT 0 - 35 U/L - 16 16   GGT 11 in June ___________________________________________ Radiologic studies:  CLINICAL DATA:  Right upper quadrant pain   EXAM: ULTRASOUND ABDOMEN LIMITED RIGHT UPPER QUADRANT   COMPARISON:  MRI 08/28/2019   FINDINGS: Gallbladder:   Surgically absent   Common bile duct:   Diameter: 5.5 mm   Liver:   Slightly echogenic. No focal hepatic abnormality. Portal vein is patent on color Doppler imaging with normal direction of blood flow towards the liver.   Other: None.    IMPRESSION: 1. Status post cholecystectomy. 2. Slightly echogenic liver suggesting steatosis     Electronically Signed   By: Donavan Foil M.D.   On: 11/24/2020 20:58  ____________________________________________ Other:   _____________________________________________ Assessment & Plan  Assessment: Encounter Diagnoses  Name Primary?   RUQ pain Yes   Gastroesophageal reflux disease without esophagitis    Chronic stable GERD symptoms.  Right upper quadrant pain, somewhat difficult to characterize.  Not clear if it is at all related to the previous anatomic abnormality seen on CT scan.   Plan: CT scan abdomen with oral and IV contrast  CMP  23 minutes were spent on this encounter (including chart review, history/exam, counseling/coordination of care, and documentation) > 50% of that time was spent on counseling and coordination of care.  Topics discussed included: See above.  Nelida Meuse III

## 2020-12-29 ENCOUNTER — Ambulatory Visit (HOSPITAL_COMMUNITY): Payer: Medicare PPO

## 2021-01-02 ENCOUNTER — Telehealth: Payer: Self-pay | Admitting: Internal Medicine

## 2021-01-02 ENCOUNTER — Ambulatory Visit: Payer: Medicare PPO

## 2021-01-02 NOTE — Telephone Encounter (Signed)
Tried calling patient @ 11:18   Left message on home # asking pt to call me  Need to r/s 01/02/21 laura out of office

## 2021-01-02 NOTE — Telephone Encounter (Signed)
Left message asking patient to call  I have both office number and my jabber number [phone]   Please r/s 01/02/21 AWV appt Mickel Baas will be out of office

## 2021-01-04 DIAGNOSIS — F3341 Major depressive disorder, recurrent, in partial remission: Secondary | ICD-10-CM | POA: Diagnosis not present

## 2021-01-06 ENCOUNTER — Encounter (HOSPITAL_COMMUNITY): Payer: Self-pay

## 2021-01-06 ENCOUNTER — Other Ambulatory Visit: Payer: Self-pay

## 2021-01-06 ENCOUNTER — Ambulatory Visit (HOSPITAL_COMMUNITY)
Admission: RE | Admit: 2021-01-06 | Discharge: 2021-01-06 | Disposition: A | Discharge status: Home / Self Care | Payer: Medicare PPO | Source: Ambulatory Visit | Attending: Gastroenterology | Admitting: Gastroenterology

## 2021-01-06 DIAGNOSIS — K769 Liver disease, unspecified: Secondary | ICD-10-CM | POA: Diagnosis not present

## 2021-01-06 DIAGNOSIS — K439 Ventral hernia without obstruction or gangrene: Secondary | ICD-10-CM | POA: Diagnosis not present

## 2021-01-06 DIAGNOSIS — R1011 Right upper quadrant pain: Secondary | ICD-10-CM | POA: Diagnosis not present

## 2021-01-06 DIAGNOSIS — N289 Disorder of kidney and ureter, unspecified: Secondary | ICD-10-CM | POA: Diagnosis not present

## 2021-01-06 DIAGNOSIS — K219 Gastro-esophageal reflux disease without esophagitis: Secondary | ICD-10-CM | POA: Insufficient documentation

## 2021-01-06 DIAGNOSIS — M47816 Spondylosis without myelopathy or radiculopathy, lumbar region: Secondary | ICD-10-CM | POA: Diagnosis not present

## 2021-01-06 MED ORDER — IOHEXOL 350 MG/ML SOLN
80.0000 mL | Freq: Once | INTRAVENOUS | Status: AC | PRN
  Administered 2021-01-06: 80 mL via INTRAVENOUS

## 2021-01-19 DIAGNOSIS — D2372 Other benign neoplasm of skin of left lower limb, including hip: Secondary | ICD-10-CM | POA: Diagnosis not present

## 2021-01-19 DIAGNOSIS — L820 Inflamed seborrheic keratosis: Secondary | ICD-10-CM | POA: Diagnosis not present

## 2021-01-30 ENCOUNTER — Ambulatory Visit: Payer: Medicare PPO

## 2021-01-30 ENCOUNTER — Telehealth: Payer: Self-pay | Admitting: Internal Medicine

## 2021-01-30 NOTE — Telephone Encounter (Signed)
Left message on home # asking pt to call @ 10:55

## 2021-01-30 NOTE — Telephone Encounter (Signed)
Left message asking pt to call [phone]  Please r/s 01/30/21 awv appointment  Mickel Baas will be out of the office

## 2021-02-06 ENCOUNTER — Telehealth: Payer: Self-pay | Admitting: Internal Medicine

## 2021-02-06 NOTE — Telephone Encounter (Signed)
Left message for patient to call back and schedule Medicare Annual Wellness Visit (AWV) either virtually or in office. Left  my jabber number [phone]    awvi 12/19/20  per [place]  please schedule at anytime with [facility] Nurse Health Advisor 1 or 2   This should be a 45 minute visit.  

## 2021-02-09 ENCOUNTER — Ambulatory Visit (HOSPITAL_BASED_OUTPATIENT_CLINIC_OR_DEPARTMENT_OTHER)
Admission: RE | Admit: 2021-02-09 | Discharge: 2021-02-09 | Disposition: A | Discharge status: Home / Self Care | Payer: Medicare PPO | Source: Ambulatory Visit | Attending: Obstetrics and Gynecology | Admitting: Obstetrics and Gynecology

## 2021-02-09 ENCOUNTER — Other Ambulatory Visit: Payer: Self-pay

## 2021-02-09 DIAGNOSIS — Z8739 Personal history of other diseases of the musculoskeletal system and connective tissue: Secondary | ICD-10-CM

## 2021-02-10 ENCOUNTER — Other Ambulatory Visit: Payer: Self-pay | Admitting: Gastroenterology

## 2021-02-13 DIAGNOSIS — F3341 Major depressive disorder, recurrent, in partial remission: Secondary | ICD-10-CM | POA: Diagnosis not present

## 2021-02-14 ENCOUNTER — Telehealth: Payer: Self-pay | Admitting: Internal Medicine

## 2021-02-14 NOTE — Telephone Encounter (Signed)
Pt has been scheduled with Dr, Ethlyn Gallery on 09/28

## 2021-02-14 NOTE — Telephone Encounter (Signed)
See if can get her appt with any provider  ( Since I am not able to see her ) .   She can also see if  Gi team can see or advise her. ( Dr Loletha Carrow)

## 2021-02-14 NOTE — Telephone Encounter (Signed)
PT called to advise that she is having a flareup of her Diverticulosis and want to know if Dr.Panosh can call her in something for it again or if she needs to be seen. Please advise.

## 2021-02-14 NOTE — Telephone Encounter (Signed)
I spoke with the pt and she stated she has been experiencing pain and discomfort for x2 days. Pt stated she has tried Tylenol for the pain. Patient reports dysuria as well. Please advice.

## 2021-02-14 NOTE — Telephone Encounter (Signed)
I spoke with the pt and she reported no fever as of today. Pt stated she did have chills last night. Pt reports no appetite and diarrhea. Pt states the pains is similar to previous time that she had diverticulosis that is why she suspects she has it now.

## 2021-02-15 ENCOUNTER — Ambulatory Visit: Payer: Medicare PPO | Admitting: Family Medicine

## 2021-02-15 ENCOUNTER — Encounter: Payer: Self-pay | Admitting: Family Medicine

## 2021-02-15 ENCOUNTER — Other Ambulatory Visit: Payer: Self-pay

## 2021-02-15 VITALS — BP 132/58 | HR 80 | Temp 98.1°F | Ht 64.5 in | Wt 184.2 lb

## 2021-02-15 DIAGNOSIS — Z23 Encounter for immunization: Secondary | ICD-10-CM

## 2021-02-15 DIAGNOSIS — K5792 Diverticulitis of intestine, part unspecified, without perforation or abscess without bleeding: Secondary | ICD-10-CM

## 2021-02-15 MED ORDER — AMOXICILLIN-POT CLAVULANATE 875-125 MG PO TABS: 1.0000 | ORAL_TABLET | Freq: Two times a day (BID) | ORAL | 0 refills | Status: AC

## 2021-02-15 NOTE — Progress Notes (Signed)
RETA NORGREN DOB: 08/22/1955 Encounter date: 02/15/2021  This is a 65 y.o. female who presents with Chief Complaint  Patient presents with   Diarrhea    X2 days   Pelvic Pain    Patient complains of pelvic pain and "sensation of pressure" x2 days, denies vaginal discharge or dysuria, concerned due to history of diverticulitis    History of present illness:  Increase pain prior to bowel movement. Has been awhile since she had diverticulitis last 5-6 years. No known triggers. Monday night when really bothering her she was feeling chilled.   No vomiting. Not much nausea. She has been able to eat - working on more bland, liquids. Pain is a little better Monday.   No Known Allergies Current Meds  Medication Sig   Cholecalciferol (VITAMIN D PO) Take 2,000 mg by mouth every other day.    FLUoxetine (PROZAC) 40 MG capsule Take 40 mg by mouth daily.   Multiple Vitamins-Minerals (CENTRUM ULTRA WOMENS PO) Take 1 tablet by mouth every other day.   nystatin-triamcinolone ointment (MYCOLOG) Apply 1 application topically 2 (two) times daily.   omeprazole (PRILOSEC) 20 MG capsule Take 1 capsule by mouth once daily   Probiotic Product (PROBIOTIC PO) Take by mouth daily.   Current Facility-Administered Medications for the 02/15/21 encounter (Office Visit) with Caren Macadam, MD  Medication   0.9 %  sodium chloride infusion    Review of Systems  Constitutional:  Negative for chills, fatigue and fever.  Respiratory:  Negative for cough, chest tightness, shortness of breath and wheezing.   Cardiovascular:  Negative for chest pain, palpitations and leg swelling.   Objective:  BP (!) 132/58 (BP Location: Left Arm, Patient Position: Sitting, Cuff Size: Large)   Pulse 80   Temp 98.1 F (36.7 C) (Oral)   Ht 5' 4.5" (1.638 m)   Wt 184 lb 3.2 oz (83.6 kg)   LMP 11/18/2004   SpO2 96%   BMI 31.13 kg/m   Weight: 184 lb 3.2 oz (83.6 kg)   BP Readings from Last 3 Encounters:  02/15/21  (!) 132/58  12/23/20 122/70  11/02/20 130/66   Wt Readings from Last 3 Encounters:  02/15/21 184 lb 3.2 oz (83.6 kg)  12/23/20 182 lb (82.6 kg)  11/02/20 184 lb 9.6 oz (83.7 kg)    Physical Exam Constitutional:      General: She is not in acute distress.    Appearance: She is well-developed.  Cardiovascular:     Rate and Rhythm: Normal rate and regular rhythm.     Heart sounds: Normal heart sounds. No murmur heard.   No friction rub.  Pulmonary:     Effort: Pulmonary effort is normal. No respiratory distress.     Breath sounds: Normal breath sounds. No wheezing or rales.  Abdominal:     General: Bowel sounds are increased.     Palpations: Abdomen is soft.     Tenderness: There is abdominal tenderness. There is no right CVA tenderness, left CVA tenderness, guarding or rebound. Negative signs include McBurney's sign and psoas sign.     Comments: Lower abd tenderness to palpation left side more than right  Musculoskeletal:     Right lower leg: No edema.     Left lower leg: No edema.  Neurological:     Mental Status: She is alert and oriented to person, place, and time.  Psychiatric:        Behavior: Behavior normal.    Assessment/Plan  1. Diverticulitis She  didn't tolerate cipro/flagyl in past but has responded to and tolerated augmentin. We will send this in today, keep up with fluids.  - amoxicillin-clavulanate (AUGMENTIN) 875-125 MG tablet; Take 1 tablet by mouth 2 (two) times daily for 7 days.  Dispense: 14 tablet; Refill: 0   Return if symptoms worsen or fail to improve.     Micheline Rough, MD

## 2021-02-15 NOTE — Addendum Note (Signed)
Addended by: Agnes Lawrence on: 02/15/2021 11:54 AM   Modules accepted: Orders

## 2021-02-24 DIAGNOSIS — H538 Other visual disturbances: Secondary | ICD-10-CM | POA: Diagnosis not present

## 2021-02-24 DIAGNOSIS — R030 Elevated blood-pressure reading, without diagnosis of hypertension: Secondary | ICD-10-CM | POA: Diagnosis not present

## 2021-02-24 DIAGNOSIS — Z79899 Other long term (current) drug therapy: Secondary | ICD-10-CM | POA: Diagnosis not present

## 2021-02-24 DIAGNOSIS — H539 Unspecified visual disturbance: Secondary | ICD-10-CM | POA: Diagnosis not present

## 2021-02-25 DIAGNOSIS — H43811 Vitreous degeneration, right eye: Secondary | ICD-10-CM | POA: Diagnosis not present

## 2021-02-28 DIAGNOSIS — F411 Generalized anxiety disorder: Secondary | ICD-10-CM | POA: Diagnosis not present

## 2021-02-28 DIAGNOSIS — F331 Major depressive disorder, recurrent, moderate: Secondary | ICD-10-CM | POA: Diagnosis not present

## 2021-03-20 ENCOUNTER — Ambulatory Visit: Payer: Medicare PPO | Attending: Internal Medicine

## 2021-03-20 ENCOUNTER — Other Ambulatory Visit: Payer: Self-pay

## 2021-03-20 ENCOUNTER — Other Ambulatory Visit (HOSPITAL_BASED_OUTPATIENT_CLINIC_OR_DEPARTMENT_OTHER): Payer: Self-pay

## 2021-03-20 DIAGNOSIS — Z23 Encounter for immunization: Secondary | ICD-10-CM

## 2021-03-20 MED ORDER — PFIZER COVID-19 VAC BIVALENT 30 MCG/0.3ML IM SUSP
INTRAMUSCULAR | 0 refills | Status: DC
  Filled 2021-03-20: qty 0.3, 1d supply, fill #0

## 2021-03-20 NOTE — Progress Notes (Signed)
   Covid-19 Vaccination Clinic  Name:  [name] [name]    MRN: [id number] DOB: 1955-08-28  03/20/2021  [name] [name] was observed post Covid-19 immunization for 15 minutes without incident. She was provided with Vaccine Information Sheet and instruction to access the V-Safe system.   [name] [name] was instructed to call 911 with any severe reactions post vaccine: Difficulty breathing  Swelling of face and throat  A fast heartbeat  A bad rash all over body  Dizziness and weakness   Immunizations Administered     Name Date Dose VIS Date Route   Pfizer Covid-19 Vaccine Bivalent Booster 03/20/2021  3:47 PM 0.3 mL 01/18/2021 Intramuscular   Manufacturer: [place]   Lot: [id number]   [place]: [phone]

## 2021-03-21 ENCOUNTER — Other Ambulatory Visit (HOSPITAL_BASED_OUTPATIENT_CLINIC_OR_DEPARTMENT_OTHER): Payer: Self-pay

## 2021-03-23 ENCOUNTER — Ambulatory Visit (HOSPITAL_BASED_OUTPATIENT_CLINIC_OR_DEPARTMENT_OTHER)
Admission: RE | Admit: 2021-03-23 | Discharge: 2021-03-23 | Disposition: A | Discharge status: Home / Self Care | Payer: Medicare PPO | Source: Ambulatory Visit | Attending: Obstetrics and Gynecology | Admitting: Obstetrics and Gynecology

## 2021-03-23 ENCOUNTER — Other Ambulatory Visit: Payer: Self-pay

## 2021-03-23 DIAGNOSIS — Z13820 Encounter for screening for osteoporosis: Secondary | ICD-10-CM | POA: Insufficient documentation

## 2021-03-23 DIAGNOSIS — Z8262 Family history of osteoporosis: Secondary | ICD-10-CM | POA: Insufficient documentation

## 2021-03-23 DIAGNOSIS — M810 Age-related osteoporosis without current pathological fracture: Secondary | ICD-10-CM | POA: Diagnosis not present

## 2021-03-23 DIAGNOSIS — Z780 Asymptomatic menopausal state: Secondary | ICD-10-CM | POA: Diagnosis not present

## 2021-03-23 DIAGNOSIS — Z8739 Personal history of other diseases of the musculoskeletal system and connective tissue: Secondary | ICD-10-CM | POA: Diagnosis present

## 2021-03-27 DIAGNOSIS — F3341 Major depressive disorder, recurrent, in partial remission: Secondary | ICD-10-CM | POA: Diagnosis not present

## 2021-04-18 DIAGNOSIS — L820 Inflamed seborrheic keratosis: Secondary | ICD-10-CM | POA: Diagnosis not present

## 2021-04-20 ENCOUNTER — Other Ambulatory Visit: Payer: Self-pay | Admitting: Obstetrics and Gynecology

## 2021-04-20 DIAGNOSIS — Z1231 Encounter for screening mammogram for malignant neoplasm of breast: Secondary | ICD-10-CM

## 2021-04-21 ENCOUNTER — Encounter: Payer: Self-pay | Admitting: Obstetrics and Gynecology

## 2021-04-21 ENCOUNTER — Ambulatory Visit: Payer: Medicare PPO | Admitting: Obstetrics and Gynecology

## 2021-04-21 ENCOUNTER — Other Ambulatory Visit: Payer: Self-pay

## 2021-04-21 VITALS — BP 124/80

## 2021-04-21 DIAGNOSIS — M810 Age-related osteoporosis without current pathological fracture: Secondary | ICD-10-CM

## 2021-04-21 DIAGNOSIS — N9089 Other specified noninflammatory disorders of vulva and perineum: Secondary | ICD-10-CM

## 2021-04-21 MED ORDER — BETAMETHASONE VALERATE 0.1 % EX OINT: TOPICAL_OINTMENT | CUTANEOUS | 0 refills | Status: DC

## 2021-04-21 MED ORDER — ALENDRONATE SODIUM 70 MG PO TABS: 70.0000 mg | ORAL_TABLET | ORAL | 3 refills | Status: DC

## 2021-04-21 NOTE — Patient Instructions (Signed)

## 2021-04-21 NOTE — Progress Notes (Signed)
GYNECOLOGY  VISIT   HPI: 65 y.o.   Divorced White or Caucasian Not Hispanic or Latino  female   G0P0000 with Patient's last menstrual period was 11/18/2004.   here to discuss her bone density results. DXA 03/23/21 T score of -2.7 in her spine.  She takes vit d every other day and a multivit every other. She walks several times a week.   She has lost of ~ 1/4 inch of height.   10/21/20 vit D 35.63 12/23/20 normal other than slightly elevated alk phos (just over normal).   She has a h/o GERD and is on Prilosec, well controlled.   She has a h/o intermittent vulvar irritation and itching she has had help with mycolog ointment. She was given a script for vaginal estrogen but is afraid to use it. No current irritation.   GYNECOLOGIC HISTORY: Patient's last menstrual period was 11/18/2004. Contraception:postmenopausal Menopausal hormone therapy: no        OB History     Gravida  0   Para  0   Term  0   Preterm  0   AB  0   Living  0      SAB  0   IAB  0   Ectopic  0   Multiple  0   Live Births  0              Patient Active Problem List   Diagnosis Date Noted   Elevated alkaline phosphatase measurement 09/26/2014   Renal cyst, left 12/31/2013   Osteopenia 12/31/2013   Unspecified vitamin D deficiency 12/31/2013   Postmenopausal atrophic vaginitis 12/31/2013   Family history of colon cancer 10/02/2013   Wrist pain, right 05/13/2013   Hx of fall 05/13/2013   Hypercholesteremia 05/13/2013   History of kidney stones    Musculoskeletal leg pain 02/02/2013   Gait abnormality 02/02/2013   Chest discomfort 05/28/2011   Left wrist injury 12/01/2010   Renal cyst 12/01/2010   Cough 09/27/2010   Neck pain 08/29/2010   Tingling sensation 08/29/2010   Allergic rhinitis, seasonal 08/29/2010   LEG PAIN, RIGHT 07/01/2009   VARICOSE VEINS LOWER EXTREMITIES W/OTH COMPS 12/06/2008   BACK PAIN, THORACIC REGION 12/06/2008   BACK PAIN 11/10/2008   BRACHIAL NEURITIS OR  RADICULITIS NOS 10/29/2008   Irritable bowel syndrome 04/23/2007   HYPERLIPIDEMIA NEC/NOS 01/10/2007   DEPRESSION 01/08/2007   GERD 01/08/2007   Diverticulosis of colon (without mention of hemorrhage) 07/28/2002    Past Medical History:  Diagnosis Date   Arthritis    Bunion    left foot   Depression    stable on medication   Diverticulitis 2015   Diverticulitis of colon without hemorrhage 09/28/2013   Diverticulosis    Gallstones    GERD (gastroesophageal reflux disease)    Hammer toes of both feet    Hyperlipidemia    no meds needed   IBS (irritable bowel syndrome)    Onychomycosis of toenail    Osteopenia    PUD (peptic ulcer disease) 1980's   Renal cyst    4.5-6 cm stable single  seen by uro in past    Past Surgical History:  Procedure Laterality Date   CHOLECYSTECTOMY  1987   COLONOSCOPY     IRRIGATION AND DEBRIDEMENT SEBACEOUS CYST Right 3/ 2014   chest wall   UPPER GASTROINTESTINAL ENDOSCOPY     WISDOM TOOTH EXTRACTION  age 52 & 38   lower teeth done first and upper wisdom teeth last  Current Outpatient Medications  Medication Sig Dispense Refill   Cholecalciferol (VITAMIN D PO) Take 2,000 mg by mouth every other day.      COVID-19 mRNA bivalent vaccine, Pfizer, (PFIZER COVID-19 VAC BIVALENT) injection Inject into the muscle. 0.3 mL 0   FLUoxetine (PROZAC) 40 MG capsule Take 40 mg by mouth daily.     Multiple Vitamins-Minerals (CENTRUM ULTRA WOMENS PO) Take 1 tablet by mouth every other day.     nystatin-triamcinolone ointment (MYCOLOG) Apply 1 application topically 2 (two) times daily. 30 g 1   omeprazole (PRILOSEC) 20 MG capsule Take 1 capsule by mouth once daily 90 capsule 0   Probiotic Product (PROBIOTIC PO) Take by mouth daily.     Current Facility-Administered Medications  Medication Dose Route Frequency Provider Last Rate Last Admin   0.9 %  sodium chloride infusion  500 mL Intravenous Once Doran Stabler, MD         ALLERGIES: Patient has no  known allergies.  Family History  Problem Relation Age of Onset   Rheum arthritis Father    Pneumonia Father        deceased   Colon cancer Father 70   Heart attack Father    Breast cancer Sister 87   Colon polyps Sister    Breast cancer Paternal Grandmother 19   Osteoarthritis Mother    COPD Mother    Pneumonia Mother    Osteoporosis Mother    Cancer Maternal Grandmother        ? GI   Heart failure Maternal Grandfather    Heart failure Paternal Grandfather    Esophageal cancer Neg Hx    Rectal cancer Neg Hx    Stomach cancer Neg Hx     Social History   Socioeconomic History   Marital status: Divorced    Spouse name: Not on file   Number of children: 0   Years of education: Not on file   Highest education level: Not on file  Occupational History   Occupation: TEACHER    Employer: [employer]  Tobacco Use   Smoking status: Never   Smokeless tobacco: Never  Vaping Use   Vaping Use: Never used  Substance and Sexual Activity   Alcohol use: No   Drug use: No   Sexual activity: Not Currently    Birth control/protection: Post-menopausal  Other Topics Concern   Not on file  Social History Narrative   Teacher media specialist graduate degree Oncologist.   Non smoker    Hh of 1 ... 2 cats    Single   G0P0   Mom is now in assisted living and she has moved into her old residence   Social Determinants of Radio broadcast assistant Strain: Not on file  Food Insecurity: Not on file  Transportation Needs: Not on file  Physical Activity: Not on file  Stress: Not on file  Social Connections: Not on file  Intimate Partner Violence: Not on file    ROS  PHYSICAL EXAMINATION:    BP 124/80   LMP 11/18/2004     General appearance: alert, cooperative and appears stated age  9. Age-related osteoporosis without current pathological fracture Normal vit d in 6/22, normal Calcium and renal function in 8/22 - Magnesium - Phosphorus - CBC -Discussed  calcium, vit d and weight bearing exercise -Discussed decreasing fall risk and importance of balance -Discussed recommendation for medication. She has well controlled reflux. We discussed that her prilosec could potentially decrease  the effectiveness of medication for osteoporosis. -Recommend bisphosphonate, discussed risks, side effects and benefits.  - alendronate (FOSAMAX) 70 MG tablet; Take 1 tablet (70 mg total) by mouth every 7 (seven) days. Take first thing in am with 6 oz. Water.  Be upright after taking.  Eat nothing for one hour.  Dispense: 12 tablet; Refill: 3 -F/U DEXA in 2 years -Information given  2. Vulvar irritation Intermittent, not currently. Gets better with application of mycolog. She decided not to use the vaginal estrogen that was prescribed at her annual exam.  -Discussed vulvar skin care. Will call in a steroid ointment for [place] up to 1-2 x a week.  - betamethasone valerate ointment (VALISONE) 0.1 %; Apply a pea sized amount topically 1-2 x a week prn  Dispense: 30 g; Refill: 0 -Return for persistent symptoms  Over 30 minutes in total patient care.

## 2021-04-22 LAB — CBC
HCT: 42 % (ref 35.0–45.0)
Hemoglobin: 13.8 g/dL (ref 11.7–15.5)
MCH: 29.1 pg (ref 27.0–33.0)
MCHC: 32.9 g/dL (ref 32.0–36.0)
MCV: 88.4 fL (ref 80.0–100.0)
MPV: 12.2 fL (ref 7.5–12.5)
Platelets: 325 10*3/uL (ref 140–400)
RBC: 4.75 10*6/uL (ref 3.80–5.10)
RDW: 13 % (ref 11.0–15.0)
WBC: 5.2 10*3/uL (ref 3.8–10.8)

## 2021-04-22 LAB — MAGNESIUM: Magnesium: 2.3 mg/dL (ref 1.5–2.5)

## 2021-04-22 LAB — PHOSPHORUS: Phosphorus: 4 mg/dL (ref 2.1–4.3)

## 2021-04-24 ENCOUNTER — Ambulatory Visit (INDEPENDENT_AMBULATORY_CARE_PROVIDER_SITE_OTHER): Payer: Medicare PPO

## 2021-04-24 VITALS — BP 122/64 | HR 66 | Temp 98.0°F | Ht 64.0 in | Wt 182.5 lb

## 2021-04-24 DIAGNOSIS — Z0000 Encounter for general adult medical examination without abnormal findings: Secondary | ICD-10-CM | POA: Diagnosis not present

## 2021-04-24 NOTE — Progress Notes (Signed)
Subjective:   [name] [name] is a 65 y.o. female who presents for Medicare Annual (Subsequent) preventive examination.  Review of Systems    No ROS Cardiac Risk Factors include: advanced age (>82men, >87 women)   Objective:    Today's Vitals   04/24/21 1525  BP: 122/64  Pulse: 66  Temp: 98 F (36.7 C)  Weight: 182 lb 8 oz (82.8 kg)  Height: 5\' 4"  (1.626 m)   Body mass index is 31.33 kg/m.  Advanced Directives 04/24/2021  Does Patient Have a Medical Advance Directive? Yes  Type of Advance Directive Living will  Does patient want to make changes to medical advance directive? No - Patient declined    Current Medications (verified) Outpatient Encounter Medications as of 04/24/2021  Medication Sig   alendronate (FOSAMAX) 70 MG tablet Take 1 tablet (70 mg total) by mouth every 7 (seven) days. Take first thing in am with 6 oz. Water.  Be upright after taking.  Eat nothing for one hour.   betamethasone valerate ointment (VALISONE) 0.1 % Apply a pea sized amount topically 1-2 x a week prn   Cholecalciferol (VITAMIN D PO) Take 2,000 mg by mouth every other day.    COVID-19 mRNA bivalent vaccine, Pfizer, (PFIZER COVID-19 VAC BIVALENT) injection Inject into the muscle.   FLUoxetine (PROZAC) 40 MG capsule Take 40 mg by mouth daily.   Multiple Vitamins-Minerals (CENTRUM ULTRA WOMENS PO) Take 1 tablet by mouth every other day.   nystatin-triamcinolone ointment (MYCOLOG) Apply 1 application topically 2 (two) times daily.   omeprazole (PRILOSEC) 20 MG capsule Take 1 capsule by mouth once daily   Probiotic Product (PROBIOTIC PO) Take by mouth daily.   Facility-Administered Encounter Medications as of 04/24/2021  Medication   0.9 %  sodium chloride infusion    Allergies (verified) Patient has no known allergies.   History: Past Medical History:  Diagnosis Date   Arthritis    Bunion    left foot   Depression    stable on medication   Diverticulitis 2015   Diverticulitis of  colon without hemorrhage 09/28/2013   Diverticulosis    Gallstones    GERD (gastroesophageal reflux disease)    Hammer toes of both feet    Hyperlipidemia    no meds needed   IBS (irritable bowel syndrome)    Onychomycosis of toenail    Osteopenia    PUD (peptic ulcer disease) 1980's   Renal cyst    4.5-6 cm stable single  seen by uro in past   Past Surgical History:  Procedure Laterality Date   CHOLECYSTECTOMY  1987   COLONOSCOPY     IRRIGATION AND DEBRIDEMENT SEBACEOUS CYST Right 3/ 2014   chest wall   UPPER GASTROINTESTINAL ENDOSCOPY     WISDOM TOOTH EXTRACTION  age 72 & 56   lower teeth done first and upper wisdom teeth last   Family History  Problem Relation Age of Onset   Rheum arthritis Father    Pneumonia Father        deceased   Colon cancer Father 13   Heart attack Father    Breast cancer Sister 39   Colon polyps Sister    Breast cancer Paternal Grandmother 59   Osteoarthritis Mother    COPD Mother    Pneumonia Mother    Osteoporosis Mother    Cancer Maternal Grandmother        ? GI   Heart failure Maternal Grandfather    Heart failure Paternal Grandfather  Esophageal cancer Neg Hx    Rectal cancer Neg Hx    Stomach cancer Neg Hx    Social History   Socioeconomic History   Marital status: Divorced    Spouse name: Not on file   Number of children: 0   Years of education: Not on file   Highest education level: Not on file  Occupational History   Occupation: TEACHER    Employer: [employer]  Tobacco Use   Smoking status: Never   Smokeless tobacco: Never  Vaping Use   Vaping Use: Never used  Substance and Sexual Activity   Alcohol use: No   Drug use: No   Sexual activity: Not Currently    Birth control/protection: Post-menopausal  Other Topics Concern   Not on file  Social History Narrative   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   Non smoker    Hh of 1 ... 2 cats    Single   [id number]   Mom is now in assisted  living and she has moved into her old residence   Social Determinants of Radio broadcast assistant Strain: Low Risk    Difficulty of Paying Living Expenses: Not hard at all  Food Insecurity: No Food Insecurity   Worried About Charity fundraiser in the Last Year: Never true   Arboriculturist in the Last Year: Never true  Transportation Needs: No Transportation Needs   Lack of Transportation (Medical): No   Lack of Transportation (Non-Medical): No  Physical Activity: Insufficiently Active   Days of Exercise per Week: 3 days   Minutes of Exercise per Session: 30 min  Stress: No Stress Concern Present   Feeling of Stress : Not at all  Social Connections: Moderately Integrated   Frequency of Communication with Friends and Family: More than three times a week   Frequency of Social Gatherings with Friends and Family: More than three times a week   Attends Religious Services: More than 4 times per year   Active Member of [employer] or Organizations: Yes   Attends Music therapist: More than 4 times per year   Marital Status: Divorced    Clinical Intake:   Diabetic? No  Activities of Daily Living In your present state of health, do you have any difficulty performing the following activities: 04/24/2021 06/01/2020  Hearing? N N  Vision? N N  Comment Wears reading glasses. Followed by Dr Gwendlyn Deutscher -  Difficulty concentrating or making decisions? N N  Walking or climbing stairs? N N  Dressing or bathing? N N  Doing errands, shopping? N N  Preparing Food and eating ? N -  Using the Toilet? N -  In the past six months, have you accidently leaked urine? N -  Do you have problems with loss of bowel control? N -  Managing your Medications? N -  Managing your Finances? N -  Some recent data might be hidden    Patient Care Team: Panosh, Standley Brooking, MD as PCP - General Kem Boroughs, FNP as Nurse Practitioner (Nurse Practitioner) Rolan Bucco, MD as Consulting Physician  (Urology) Loletha Carrow Kirke Corin, MD as Consulting Physician (Gastroenterology) Alyson Ingles Candee Furbish, MD as Consulting Physician (Urology)  Indicate any recent Medical Services you may have received from other than [facility] providers in the past year (date may be approximate).     Assessment:   This is a routine wellness examination for [name] [name].  Hearing/Vision screen Hearing Screening - Comments:: No  difficulty hearing Vision Screening - Comments:: Wears reading glasses. Followed by Dr Gwendlyn Deutscher  Dietary issues and exercise activities discussed:   Diet: Regular   Goals Addressed             This Visit's Progress    Increase physical activity       Patient states would like to lose weight. Plans to go to the [facility].       Depression Screen PHQ 2/9 Scores 04/24/2021 04/17/2017  PHQ - 2 Score 0 0    Fall Risk Fall Risk  04/24/2021 05/23/2020 04/17/2017  Falls in the past year? 1 0 No  Number falls in past yr: 0 0 -  Comment Fall without injury. No medical attention needed. - -  Injury with Fall? 0 0 -    FALL RISK PREVENTION PERTAINING TO THE HOME:  Any stairs in or around the home? No  If so, are there any without handrails? No  Home free of loose throw rugs in walkways, pet beds, electrical cords, etc? Yes  Adequate lighting in your home to reduce risk of falls? Yes   ASSISTIVE DEVICES UTILIZED TO PREVENT FALLS:  Life alert? No  Use of a cane, walker or w/c? No  Grab bars in the bathroom? Yes  Shower chair or bench in shower? No  Elevated toilet seat or a handicapped toilet? No   TIMED UP AND GO:  Was the test performed? Yes .  Length of time to ambulate 10 feet: 5 sec.   Gait steady and fast without use of assistive device  Cognitive Function:     6CIT Screen 04/24/2021  What Year? 0 points  What month? 0 points  What time? 0 points  Count back from 20 0 points  Months in reverse 0 points  Repeat phrase 0 points  Total Score 0    Immunizations Immunization  History  Administered Date(s) Administered   Fluad Quad(high Dose 65+) 03/01/2020, 02/15/2021   Influenza,inj,Quad PF,6+ Mos 03/19/2019   PFIZER(Purple Top)SARS-COV-2 Vaccination 08/22/2019, 09/16/2019, 03/05/2020   Pfizer Covid-19 Vaccine Bivalent Booster 19yrs & up 03/20/2021   Pneumococcal Conjugate-13 06/01/2020   Td 05/21/2001   Tdap 05/13/2013    Qualifies for Shingles Vaccine? Yes   Zostavax completed No   Shingrix Completed?: No.    Education has been provided regarding the importance of this vaccine. Patient has been advised to call insurance company to determine out of pocket expense if they have not yet received this vaccine. Advised may also receive vaccine at local pharmacy or Health Dept. Verbalized acceptance and understanding.  Screening Tests Health Maintenance  Topic Date Due   Pneumonia Vaccine 18+ Years old (2 - PPSV23 if available, else PCV20) 06/01/2021   Zoster Vaccines- Shingrix (1 of 2) 05/17/2021 (Originally 01/05/1975)   MAMMOGRAM  05/04/2022   COLONOSCOPY (Pts 45-34yrs Insurance coverage will need to be confirmed)  03/29/2023   TETANUS/TDAP  05/14/2023   INFLUENZA VACCINE  Completed   DEXA SCAN  Completed   COVID-19 Vaccine  Completed   Hepatitis C Screening  Completed   HPV VACCINES  Aged Out    Health Maintenance  Health Maintenance Due  Topic Date Due   Pneumonia Vaccine 12+ Years old (2 - PPSV23 if available, else PCV20) 06/01/2021    Vision Screening: Recommended annual ophthalmology exams for early detection of glaucoma and other disorders of the eye. Is the patient up to date with their annual eye exam?  Yes  Who is the provider or what  is the name of the office in which the patient attends annual eye exams? Followed by Dr Gwendlyn Deutscher   Dental Screening: Recommended annual dental exams for proper oral hygiene  Community Resource Referral / Chronic Care Management:  CRR required this visit?  No   CCM required this visit?  No      Plan:      I have personally reviewed and noted the following in the patient's chart:   Medical and social history Use of alcohol, tobacco or illicit drugs  Current medications and supplements including opioid prescriptions. Patient currently not taking opioids Functional ability and status Nutritional status Physical activity Advanced directives List of other physicians Hospitalizations, surgeries, and ER visits in previous 12 months Vitals Screenings to include cognitive, depression, and falls Referrals and appointments  In addition, I have reviewed and discussed with patient certain preventive protocols, quality metrics, and best practice recommendations. A written personalized care plan for preventive services as well as general preventive health recommendations were provided to patient.     Criselda Peaches, LPN   17/0/0174

## 2021-04-24 NOTE — Patient Instructions (Addendum)
[name] [name] , Thank you for taking time to come for your Medicare Wellness Visit. I appreciate your ongoing commitment to your health goals. Please review the following plan we discussed and let me know if I can assist you in the future.   These are the goals we discussed:  Goals      Increase physical activity     Patient states would like to lose weight. Plans to go to the [facility].        This is a list of the screening recommended for you and due dates:  Health Maintenance  Topic Date Due   Pneumonia Vaccine (2 - PPSV23 if available, else PCV20) 06/01/2021   Zoster (Shingles) Vaccine (1 of 2) 05/17/2021*   Mammogram  05/04/2022   Colon Cancer Screening  03/29/2023   Tetanus Vaccine  05/14/2023   Flu Shot  Completed   DEXA scan (bone density measurement)  Completed   COVID-19 Vaccine  Completed   Hepatitis C Screening: USPSTF Recommendation to screen - Ages 43-79 yo.  Completed   HPV Vaccine  Aged Out  *Topic was postponed. The date shown is not the original due date.    Advanced directives: Yes  Conditions/risks identified: None  Next appointment: Follow up in one year for your annual wellness visit  Preventive Care 65 Years and Older, Female Preventive care refers to lifestyle choices and visits with your health care provider that can promote health and wellness. What does preventive care include? A yearly physical exam. This is also called an annual well check. Dental exams once or twice a year. Routine eye exams. Ask your health care provider how often you should have your eyes checked. Personal lifestyle choices, including: Daily care of your teeth and gums. Regular physical activity. Eating a healthy diet. Avoiding tobacco and drug use. Limiting alcohol use. Practicing safe sex. Taking low-dose aspirin every day. Taking vitamin and mineral supplements as recommended by your health care provider. What happens during an annual well check? The services and  screenings done by your health care provider during your annual well check will depend on your age, overall health, lifestyle risk factors, and family history of disease. Counseling  Your health care provider may ask you questions about your: Alcohol use. Tobacco use. Drug use. Emotional well-being. Home and relationship well-being. Sexual activity. Eating habits. History of falls. Memory and ability to understand (cognition). Work and work Statistician. Reproductive health. Screening  You may have the following tests or measurements: Height, weight, and BMI. Blood pressure. Lipid and cholesterol levels. These may be checked every 5 years, or more frequently if you are over 54 years old. Skin check. Lung cancer screening. You may have this screening every year starting at age 94 if you have a 30-pack-year history of smoking and currently smoke or have quit within the past 15 years. Fecal occult blood test (FOBT) of the stool. You may have this test every year starting at age 61. Flexible sigmoidoscopy or colonoscopy. You may have a sigmoidoscopy every 5 years or a colonoscopy every 10 years starting at age 37. Hepatitis C blood test. Hepatitis B blood test. Sexually transmitted disease (STD) testing. Diabetes screening. This is done by checking your blood sugar (glucose) after you have not eaten for a while (fasting). You may have this done every 1-3 years. Bone density scan. This is done to screen for osteoporosis. You may have this done starting at age 38. Mammogram. This may be done every 1-2 years. Talk to  your health care provider about how often you should have regular mammograms. Talk with your health care provider about your test results, treatment options, and if necessary, the need for more tests. Vaccines  Your health care provider may recommend certain vaccines, such as: Influenza vaccine. This is recommended every year. Tetanus, diphtheria, and acellular pertussis (Tdap,  Td) vaccine. You may need a Td booster every 10 years. Zoster vaccine. You may need this after age 33. Pneumococcal 13-valent conjugate (PCV13) vaccine. One dose is recommended after age 64. Pneumococcal polysaccharide (PPSV23) vaccine. One dose is recommended after age 30. Talk to your health care provider about which screenings and vaccines you need and how often you need them. This information is not intended to replace advice given to you by your health care provider. Make sure you discuss any questions you have with your health care provider. Document Released: 06/03/2015 Document Revised: 01/25/2016 Document Reviewed: 03/08/2015 Elsevier Interactive Patient Education  2017 [place] Prevention in the Home Falls can cause injuries. They can happen to people of all ages. There are many things you can do to make your home safe and to help prevent falls. What can I do on the outside of my home? Regularly fix the edges of walkways and driveways and fix any cracks. Remove anything that might make you trip as you walk through a door, such as a raised step or threshold. Trim any bushes or trees on the path to your home. Use bright outdoor lighting. Clear any walking paths of anything that might make someone trip, such as rocks or tools. Regularly check to see if handrails are loose or broken. Make sure that both sides of any steps have handrails. Any raised decks and porches should have guardrails on the edges. Have any leaves, snow, or ice cleared regularly. Use sand or salt on walking paths during winter. Clean up any spills in your garage right away. This includes oil or grease spills. What can I do in the bathroom? Use night lights. Install grab bars by the toilet and in the tub and shower. Do not use towel bars as grab bars. Use non-skid mats or decals in the tub or shower. If you need to sit down in the shower, use a plastic, non-slip stool. Keep the floor dry. Clean up any  water that spills on the floor as soon as it happens. Remove soap buildup in the tub or shower regularly. Attach bath mats securely with double-sided non-slip rug tape. Do not have throw rugs and other things on the floor that can make you trip. What can I do in the bedroom? Use night lights. Make sure that you have a light by your bed that is easy to reach. Do not use any sheets or blankets that are too big for your bed. They should not hang down onto the floor. Have a firm chair that has side arms. You can use this for support while you get dressed. Do not have throw rugs and other things on the floor that can make you trip. What can I do in the kitchen? Clean up any spills right away. Avoid walking on wet floors. Keep items that you use a lot in easy-to-reach places. If you need to reach something above you, use a strong step stool that has a grab bar. Keep electrical cords out of the way. Do not use floor polish or wax that makes floors slippery. If you must use wax, use non-skid floor wax. Do not  have throw rugs and other things on the floor that can make you trip. What can I do with my stairs? Do not leave any items on the stairs. Make sure that there are handrails on both sides of the stairs and use them. Fix handrails that are broken or loose. Make sure that handrails are as long as the stairways. Check any carpeting to make sure that it is firmly attached to the stairs. Fix any carpet that is loose or worn. Avoid having throw rugs at the top or bottom of the stairs. If you do have throw rugs, attach them to the floor with carpet tape. Make sure that you have a light switch at the top of the stairs and the bottom of the stairs. If you do not have them, ask someone to add them for you. What else can I do to help prevent falls? Wear shoes that: Do not have high heels. Have rubber bottoms. Are comfortable and fit you well. Are closed at the toe. Do not wear sandals. If you use a  stepladder: Make sure that it is fully opened. Do not climb a closed stepladder. Make sure that both sides of the stepladder are locked into place. Ask someone to hold it for you, if possible. Clearly mark and make sure that you can see: Any grab bars or handrails. First and last steps. Where the edge of each step is. Use tools that help you move around (mobility aids) if they are needed. These include: Canes. Walkers. Scooters. Crutches. Turn on the lights when you go into a dark area. Replace any light bulbs as soon as they burn out. Set up your furniture so you have a clear path. Avoid moving your furniture around. If any of your floors are uneven, fix them. If there are any pets around you, be aware of where they are. Review your medicines with your doctor. Some medicines can make you feel dizzy. This can increase your chance of falling. Ask your doctor what other things that you can do to help prevent falls. This information is not intended to replace advice given to you by your health care provider. Make sure you discuss any questions you have with your health care provider. Document Released: 03/03/2009 Document Revised: 10/13/2015 Document Reviewed: 06/11/2014 Elsevier Interactive Patient Education  2017 [employer].

## 2021-04-27 DIAGNOSIS — F411 Generalized anxiety disorder: Secondary | ICD-10-CM | POA: Diagnosis not present

## 2021-04-27 DIAGNOSIS — F331 Major depressive disorder, recurrent, moderate: Secondary | ICD-10-CM | POA: Diagnosis not present

## 2021-05-08 ENCOUNTER — Telehealth: Payer: Self-pay

## 2021-05-08 DIAGNOSIS — F3341 Major depressive disorder, recurrent, in partial remission: Secondary | ICD-10-CM | POA: Diagnosis not present

## 2021-05-08 NOTE — Telephone Encounter (Signed)
Pt calling to report possible reaction to generic fosomax. Reports pain starting in B/L hands as of yesterday and redness/swelling in joints of hands today. Please advise.

## 2021-05-09 ENCOUNTER — Encounter: Payer: Self-pay | Admitting: Obstetrics and Gynecology

## 2021-05-09 NOTE — Telephone Encounter (Signed)
Patient said she is going to follow up with PCP regarding this.

## 2021-05-09 NOTE — Telephone Encounter (Signed)
Patient sent my chart message today as well.  Dr. Talbert Nan: I have experienced a reaction to the Alendronate Sodium with this second dosage.  After taking the medicine on Saturday, I awoke on Sunday with very stiff hands and a sore neck. On Monday, I developed some bumps on the joints of five fingers, which hurt if they are bumped against a surface.  Is this a reaction to the new prescription?  My hands are still tender and the muscles on the back of my neck still sore.  I am treating the neck with a heat wrap and some ointment for sore muscles. Thank you in advance for your response and I hope that you have  a wonderful holiday. [name] [name] June 07, 1955

## 2021-05-09 NOTE — Telephone Encounter (Signed)
Please let the patient know that I don't know if the Fosamax is causing her symptoms, but it is possible for people to develop muscle pain or joint pain/swelling with this type of medication. I'm not sure if the bumps she is referring to on her joints is swelling of the joints? If she doesn't have any preexisting history of arthritis and her symptoms are significant we should consider stopping the medication.  If her symptoms are persisting, I would recommend that she follow up with Dr Regis Bill and have her evaluate her hands.  If she end up going off of the Alendronate Sodium, we need to consider other options of treatment.  Please check on her again next week.

## 2021-05-10 ENCOUNTER — Ambulatory Visit: Payer: Medicare PPO | Admitting: Family Medicine

## 2021-05-10 VITALS — BP 120/60 | HR 90 | Temp 98.2°F | Wt 183.6 lb

## 2021-05-10 DIAGNOSIS — M67449 Ganglion, unspecified hand: Secondary | ICD-10-CM | POA: Diagnosis not present

## 2021-05-10 DIAGNOSIS — M542 Cervicalgia: Secondary | ICD-10-CM

## 2021-05-10 NOTE — Patient Instructions (Signed)
Suspect benign digital mucus cyst of left middle finger  Consider holding the Fosamax for a few weeks to see if neck/musculoskeletal pain improves.    Daily calcium 1,200 mg and Vit D 800-1,000 IU

## 2021-05-10 NOTE — Progress Notes (Signed)
Established Patient Office Visit  Subjective:  Patient ID: [name] [name], female    DOB: Sep 20, 1955  Age: 65 y.o. MRN: [id number]  CC: No chief complaint on file.   HPI [name] [name] presents for neck pain and some joint aches after starting Fosamax 2 weeks ago.  She is not sure if this is related or not (to the Fosamax).  She was started on Fosamax per GYN.  She had DEXA scan recently with T score -2.7.  She has mostly pain in her neck region.  Does not have any generalized myalgias.  She also describes a tender nodule on her left middle finger over the DIP joint and wonders if this could be related.  She does have family history of rheumatoid arthritis.  Has not noted any joint swelling or erythema or warmth.  She has not had any skin rashes.  No intraoral lesions.  Past Medical History:  Diagnosis Date   Arthritis    Bunion    left foot   Depression    stable on medication   Diverticulitis 2015   Diverticulitis of colon without hemorrhage 09/28/2013   Diverticulosis    Gallstones    GERD (gastroesophageal reflux disease)    Hammer toes of both feet    Hyperlipidemia    no meds needed   IBS (irritable bowel syndrome)    Onychomycosis of toenail    Osteopenia    PUD (peptic ulcer disease) 1980's   Renal cyst    4.5-6 cm stable single  seen by uro in past    Past Surgical History:  Procedure Laterality Date   CHOLECYSTECTOMY  1987   COLONOSCOPY     IRRIGATION AND DEBRIDEMENT SEBACEOUS CYST Right 3/ 2014   chest wall   UPPER GASTROINTESTINAL ENDOSCOPY     WISDOM TOOTH EXTRACTION  age 39 & 63   lower teeth done first and upper wisdom teeth last    Family History  Problem Relation Age of Onset   Rheum arthritis Father    Pneumonia Father        deceased   Colon cancer Father 51   Heart attack Father    Breast cancer Sister 69   Colon polyps Sister    Breast cancer Paternal Grandmother 70   Osteoarthritis Mother    COPD Mother    Pneumonia Mother     Osteoporosis Mother    Cancer Maternal Grandmother        ? GI   Heart failure Maternal Grandfather    Heart failure Paternal Grandfather    Esophageal cancer Neg Hx    Rectal cancer Neg Hx    Stomach cancer Neg Hx     Social History   Socioeconomic History   Marital status: Divorced    Spouse name: Not on file   Number of children: 0   Years of education: Not on file   Highest education level: Not on file  Occupational History   Occupation: TEACHER    Employer: [employer]  Tobacco Use   Smoking status: Never   Smokeless tobacco: Never  Vaping Use   Vaping Use: Never used  Substance and Sexual Activity   Alcohol use: No   Drug use: No   Sexual activity: Not Currently    Birth control/protection: Post-menopausal  Other Topics Concern   Not on file  Social History Narrative   Teacher media specialist graduate degree Oncologist.   Non smoker    Hh of 1 .Marland KitchenMarland Kitchen  2 cats    Single   G0P0   Mom is now in assisted living and she has moved into her old residence   Social Determinants of Radio broadcast assistant Strain: Low Risk    Difficulty of Paying Living Expenses: Not hard at all  Food Insecurity: No Food Insecurity   Worried About Charity fundraiser in the Last Year: Never true   Arboriculturist in the Last Year: Never true  Transportation Needs: No Transportation Needs   Lack of Transportation (Medical): No   Lack of Transportation (Non-Medical): No  Physical Activity: Insufficiently Active   Days of Exercise per Week: 3 days   Minutes of Exercise per Session: 30 min  Stress: No Stress Concern Present   Feeling of Stress : Not at all  Social Connections: Moderately Integrated   Frequency of Communication with Friends and Family: More than three times a week   Frequency of Social Gatherings with Friends and Family: More than three times a week   Attends Religious Services: More than 4 times per year   Active Member of [employer] or Organizations: Yes    Attends Music therapist: More than 4 times per year   Marital Status: Divorced  Human resources officer Violence: Not At Risk   Fear of Current or Ex-Partner: No   Emotionally Abused: No   Physically Abused: No   Sexually Abused: No    Outpatient Medications Prior to Visit  Medication Sig Dispense Refill   alendronate (FOSAMAX) 70 MG tablet Take 1 tablet (70 mg total) by mouth every 7 (seven) days. Take first thing in am with 6 oz. Water.  Be upright after taking.  Eat nothing for one hour. 12 tablet 3   betamethasone valerate ointment (VALISONE) 0.1 % Apply a pea sized amount topically 1-2 x a week prn 30 g 0   Cholecalciferol (VITAMIN D PO) Take 2,000 mg by mouth every other day.      COVID-19 mRNA bivalent vaccine, Pfizer, (PFIZER COVID-19 VAC BIVALENT) injection Inject into the muscle. 0.3 mL 0   FLUoxetine (PROZAC) 40 MG capsule Take 40 mg by mouth daily.     Multiple Vitamins-Minerals (CENTRUM ULTRA WOMENS PO) Take 1 tablet by mouth every other day.     nystatin-triamcinolone ointment (MYCOLOG) Apply 1 application topically 2 (two) times daily. 30 g 1   omeprazole (PRILOSEC) 20 MG capsule Take 1 capsule by mouth once daily 90 capsule 0   Probiotic Product (PROBIOTIC PO) Take by mouth daily.     Facility-Administered Medications Prior to Visit  Medication Dose Route Frequency Provider Last Rate Last Admin   0.9 %  sodium chloride infusion  500 mL Intravenous Once Nelida Meuse III, MD        No Known Allergies  ROS Review of Systems  Constitutional:  Negative for chills and fever.  Musculoskeletal:  Positive for neck pain.     Objective:    Physical Exam Vitals reviewed.  Constitutional:      Appearance: Normal appearance.  Cardiovascular:     Rate and Rhythm: Normal rate and regular rhythm.  Pulmonary:     Effort: Pulmonary effort is normal.     Breath sounds: Normal breath sounds.  Lymphadenopathy:     Cervical: No cervical adenopathy.  Skin:     Comments: Translucent digital mucous cyst left middle finger dorsally over the DIP joint  Neurological:     Mental Status: She is alert.  BP 120/60 (BP Location: Left Arm, Patient Position: Sitting, Cuff Size: Normal)    Pulse 90    Temp 98.2 F (36.8 C) (Oral)    Wt 183 lb 9.6 oz (83.3 kg)    LMP 11/18/2004    SpO2 97%    BMI 31.51 kg/m  Wt Readings from Last 3 Encounters:  05/10/21 183 lb 9.6 oz (83.3 kg)  04/24/21 182 lb 8 oz (82.8 kg)  02/15/21 184 lb 3.2 oz (83.6 kg)     Health Maintenance Due  Topic Date Due   Pneumonia Vaccine 48+ Years old (2 - PPSV23 if available, else PCV20) 06/01/2021    There are no preventive care reminders to display for this patient.  Lab Results  Component Value Date   TSH 1.57 06/17/2020   Lab Results  Component Value Date   WBC 5.2 04/21/2021   HGB 13.8 04/21/2021   HCT 42.0 04/21/2021   MCV 88.4 04/21/2021   PLT 325 04/21/2021   Lab Results  Component Value Date   NA 137 12/23/2020   K 4.2 12/23/2020   CO2 27 12/23/2020   GLUCOSE 96 12/23/2020   BUN 18 12/23/2020   CREATININE 0.86 12/23/2020   BILITOT 0.5 12/23/2020   ALKPHOS 122 (H) 12/23/2020   AST 15 12/23/2020   ALT 14 12/23/2020   PROT 7.6 12/23/2020   ALBUMIN 4.2 12/23/2020   CALCIUM 9.2 12/23/2020   GFR 70.62 12/23/2020   Lab Results  Component Value Date   CHOL 217 (H) 06/17/2020   Lab Results  Component Value Date   HDL 76.00 06/17/2020   Lab Results  Component Value Date   LDLCALC 126 (H) 06/17/2020   Lab Results  Component Value Date   TRIG 74.0 06/17/2020   Lab Results  Component Value Date   CHOLHDL 3 06/17/2020   Lab Results  Component Value Date   HGBA1C 5.9 06/17/2020      Assessment & Plan:   #1 benign appearing digital mucous cyst left middle finger.  Reassurance given.  Do not recommend further treatment  #2 neck pain predominantly with some other arthralgias as well.  This seemed to start after starting Fosamax couple weeks ago.   We explained that musculoskeletal complaints including myalgias and potentially arthralgias can be seen with Fosamax.  I suggest that she leave off the Fosamax for a few weeks and if pains ceasing with that consider 1 more repeat trial.  If she continues to have musculoskeletal complaints on Fosamax recommend she follow-up with her GYN.  We also discussed that if her musculoskeletal complaints continue even off of Fosamax could do some screening to rule out inflammatory arthritis  No orders of the defined types were placed in this encounter.   Follow-up: No follow-ups on file.    Carolann Littler, MD

## 2021-05-11 ENCOUNTER — Telehealth: Payer: Self-pay | Admitting: Internal Medicine

## 2021-05-11 MED ORDER — METHOCARBAMOL 500 MG PO TABS: 500.0000 mg | ORAL_TABLET | Freq: Three times a day (TID) | ORAL | 0 refills | Status: DC | PRN

## 2021-05-11 NOTE — Telephone Encounter (Signed)
Pt saw dr Elease Hashimoto yesterday and she is calling to ask dr burchette to send in muscle relaxer to  [facility], [address] Phone:  [phone]  Fax:  [phone]

## 2021-05-11 NOTE — Telephone Encounter (Signed)
Left a detailed message on verified voice mail informing the patient that a Rx of Robaxin has been sent in.

## 2021-05-11 NOTE — Telephone Encounter (Signed)
Sent in some Robaxin

## 2021-05-12 ENCOUNTER — Encounter: Payer: Self-pay | Admitting: Emergency Medicine

## 2021-05-12 ENCOUNTER — Emergency Department
Admission: EM | Admit: 2021-05-12 | Discharge: 2021-05-12 | Disposition: A | Discharge status: Home / Self Care | Payer: Medicare PPO | Source: Home / Self Care | Attending: Family Medicine | Admitting: Family Medicine

## 2021-05-12 DIAGNOSIS — U071 COVID-19: Secondary | ICD-10-CM | POA: Diagnosis not present

## 2021-05-12 LAB — POCT INFLUENZA A/B
Influenza A, POC: NEGATIVE
Influenza B, POC: NEGATIVE

## 2021-05-12 LAB — POC SARS CORONAVIRUS 2 AG -  ED: SARS Coronavirus 2 Ag: POSITIVE — AB

## 2021-05-12 MED ORDER — DOXYCYCLINE HYCLATE 100 MG PO CAPS: ORAL_CAPSULE | ORAL | 0 refills | Status: DC

## 2021-05-12 NOTE — Discharge Instructions (Signed)
Take plain guaifenesin (1200mg  extended release tabs such as Mucinex) twice daily, with plenty of water, for cough and congestion.  May add Pseudoephedrine (30mg , one or two every 4 to 6 hours) for sinus congestion.  Get adequate rest.   May take Delsym Cough Suppressant ("12 Hour Cough Relief") at bedtime for nighttime cough.  Try warm salt water gargles for sore throat.  Stop all antihistamines for now, and other non-prescription cough/cold preparations. May take Tylenol as needed for fever, body aches, etc.   Your COVID-19 test is positive.  Isolate yourself for five days from today.  At the end of five days you may end isolation if your symptoms have cleared or improved, and you have not had a fever for 24 hours. At this time you should wear a mask for five more days when you are around others.   If symptoms become significantly worse during the night or over the weekend, proceed to the local emergency room.

## 2021-05-12 NOTE — ED Triage Notes (Signed)
Cough & congestion w/ body aches  COVID exposure on Monday  OTC advil - none today  Muscle relaxers are for neck pain - pt was put on fosamax & has had neck pain & hand pain x 1 week  COVID  bivalent booster & flu vaccine this year

## 2021-05-12 NOTE — ED Provider Notes (Signed)
[name]    CSN: [id number] Arrival date & time: 05/12/21  1122      History   Chief Complaint Chief Complaint  Patient presents with   Cough   Generalized Body Aches    HPI [name] [name] is a 65 y.o. female.   Two days ago patient developed sinus congestion, productive cough, sore throat, hoarseness, headache, myalgias, and fatigue  The history is provided by the patient.   Past Medical History:  Diagnosis Date   Arthritis    Bunion    left foot   Depression    stable on medication   Diverticulitis 2015   Diverticulitis of colon without hemorrhage 09/28/2013   Diverticulosis    Gallstones    GERD (gastroesophageal reflux disease)    Hammer toes of both feet    Hyperlipidemia    no meds needed   IBS (irritable bowel syndrome)    Onychomycosis of toenail    Osteopenia    PUD (peptic ulcer disease) 1980's   Renal cyst    4.5-6 cm stable single  seen by uro in past    Patient Active Problem List   Diagnosis Date Noted   Elevated alkaline phosphatase measurement 09/26/2014   Renal cyst, left 12/31/2013   Osteopenia 12/31/2013   Unspecified vitamin D deficiency 12/31/2013   Postmenopausal atrophic vaginitis 12/31/2013   Family history of colon cancer 10/02/2013   Wrist pain, right 05/13/2013   Hx of fall 05/13/2013   Hypercholesteremia 05/13/2013   History of kidney stones    Musculoskeletal leg pain 02/02/2013   Gait abnormality 02/02/2013   Chest discomfort 05/28/2011   Left wrist injury 12/01/2010   Renal cyst 12/01/2010   Cough 09/27/2010   Neck pain 08/29/2010   Tingling sensation 08/29/2010   Allergic rhinitis, seasonal 08/29/2010   LEG PAIN, RIGHT 07/01/2009   VARICOSE VEINS LOWER EXTREMITIES W/OTH COMPS 12/06/2008   BACK PAIN, THORACIC REGION 12/06/2008   BACK PAIN 11/10/2008   BRACHIAL NEURITIS OR RADICULITIS NOS 10/29/2008   Irritable bowel syndrome 04/23/2007   HYPERLIPIDEMIA NEC/NOS 01/10/2007   DEPRESSION 01/08/2007    GERD 01/08/2007   Diverticulosis of colon (without mention of hemorrhage) 07/28/2002    Past Surgical History:  Procedure Laterality Date   CHOLECYSTECTOMY  1987   COLONOSCOPY     IRRIGATION AND DEBRIDEMENT SEBACEOUS CYST Right 3/ 2014   chest wall   UPPER GASTROINTESTINAL ENDOSCOPY     WISDOM TOOTH EXTRACTION  age 7 & 1   lower teeth done first and upper wisdom teeth last    OB History     Gravida  0   Para  0   Term  0   Preterm  0   AB  0   Living  0      SAB  0   IAB  0   Ectopic  0   Multiple  0   Live Births  0            Home Medications    Prior to Admission medications   Medication Sig Start Date End Date Taking? Authorizing Provider  doxycycline (VIBRAMYCIN) 100 MG capsule Take one cap PO Q12hr with food. 05/12/21  Yes Kandra Nicolas, MD  alendronate (FOSAMAX) 70 MG tablet Take 1 tablet (70 mg total) by mouth every 7 (seven) days. Take first thing in am with 6 oz. Water.  Be upright after taking.  Eat nothing for one hour. Patient not taking: Reported on 05/12/2021 04/21/21  Salvadore Dom, MD  betamethasone valerate ointment (VALISONE) 0.1 % Apply a pea sized amount topically 1-2 x a week prn 04/21/21   Salvadore Dom, MD  Cholecalciferol (VITAMIN D PO) Take 2,000 mg by mouth every other day.     [provider]  COVID-19 mRNA bivalent vaccine, Pfizer, (PFIZER COVID-19 VAC BIVALENT) injection Inject into the muscle. 03/20/21   Carlyle Basques, MD  FLUoxetine (PROZAC) 10 MG tablet Take 5 mg by mouth daily. 04/27/21   [provider]  FLUoxetine (PROZAC) 40 MG capsule Take 40 mg by mouth daily. 07/23/19   [provider]  methocarbamol (ROBAXIN) 500 MG tablet Take 1 tablet (500 mg total) by mouth every 8 (eight) hours as needed for muscle spasms. 05/11/21   Burchette, Alinda Sierras, MD  Multiple Vitamins-Minerals (CENTRUM ULTRA WOMENS PO) Take 1 tablet by mouth every other day.    [provider]   nystatin-triamcinolone ointment (MYCOLOG) Apply 1 application topically 2 (two) times daily. Patient not taking: Reported on 05/12/2021 06/24/20   Karma Ganja, NP  omeprazole (PRILOSEC) 20 MG capsule Take 1 capsule by mouth once daily 02/10/21   Doran Stabler, MD  Probiotic Product (PROBIOTIC PO) Take by mouth daily.    [provider]    Family History Family History  Problem Relation Age of Onset   Rheum arthritis Father    Pneumonia Father        deceased   Colon cancer Father 68   Heart attack Father    Breast cancer Sister 72   Colon polyps Sister    Breast cancer Paternal Grandmother 71   Osteoarthritis Mother    COPD Mother    Pneumonia Mother    Osteoporosis Mother    Cancer Maternal Grandmother        ? GI   Heart failure Maternal Grandfather    Heart failure Paternal Grandfather    Esophageal cancer Neg Hx    Rectal cancer Neg Hx    Stomach cancer Neg Hx     Social History Social History   Tobacco Use   Smoking status: Never   Smokeless tobacco: Never  Vaping Use   Vaping Use: Never used  Substance Use Topics   Alcohol use: No   Drug use: No     Allergies   Tylenol [acetaminophen]   Review of Systems Review of Systems + sore throat + hoarse + cough No pleuritic pain No wheezing + nasal congestion + post-nasal drainage No sinus pain/pressure No itchy/red eyes No earache No hemoptysis No SOB No fever/chills No nausea No vomiting No abdominal pain No diarrhea No urinary symptoms No skin rash + fatigue + myalgias + headache   Physical Exam Triage Vital Signs ED Triage Vitals  Enc Vitals Group     BP 05/12/21 1339 130/86     Pulse Rate 05/12/21 1339 86     Resp 05/12/21 1339 16     Temp 05/12/21 1339 99.7 F (37.6 C)     Temp Source 05/12/21 1339 Oral     SpO2 05/12/21 1339 96 %     Weight 05/12/21 1342 182 lb (82.6 kg)     Height 05/12/21 1342 5\' 4"  (1.626 m)     Head Circumference --      Peak Flow --       Pain Score 05/12/21 1342 3     Pain Loc --      Pain Edu? --      Excl.  in [place]? --    No data found.  Updated Vital Signs BP 130/86 (BP Location: Left Arm)    Pulse 86    Temp 99.7 F (37.6 C) (Oral)    Resp 16    Ht 5\' 4"  (1.626 m)    Wt 82.6 kg    LMP 11/18/2004    SpO2 96%    BMI 31.24 kg/m   Visual Acuity Right Eye Distance:   Left Eye Distance:   Bilateral Distance:    Right Eye Near:   Left Eye Near:    Bilateral Near:     Physical Exam Nursing notes and Vital Signs reviewed. Appearance:  Patient appears stated age, and in no acute distress Eyes:  Pupils are equal, round, and reactive to light and accomodation.  Extraocular movement is intact.  Conjunctivae are not inflamed  Ears:  Canals normal.  Tympanic membranes normal.  Nose:  Mildly congested turbinates.  No sinus tenderness.  Pharynx:  Normal Neck:  Supple.  Mildly enlarged lateral nodes are present, tender to palpation on the left.   Lungs:  Clear to auscultation.  Breath sounds are equal.  Moving air well. Heart:  Regular rate and rhythm without murmurs, rubs, or gallops.  Abdomen:  Nontender without masses or hepatosplenomegaly.  Bowel sounds are present.  No CVA or flank tenderness.  Extremities:  No edema.  Skin:  No rash present.   UC Treatments / Results  Labs (all labs ordered are listed, but only abnormal results are displayed) Labs Reviewed  POC SARS CORONAVIRUS 2 AG -  ED - Abnormal; Notable for the following components:      Result Value   SARS Coronavirus 2 Ag Positive (*)    All other components within normal limits  POCT INFLUENZA A/B negative    EKG   Radiology No results found.  Procedures Procedures (including critical care time)  Medications Ordered in UC Medications - No data to display  Initial Impression / Assessment and Plan / UC Course  I have reviewed the triage vital signs and the nursing notes.  Pertinent labs & imaging results that were available during my care of  the patient were reviewed by me and considered in my medical decision making (see chart for details).    Benign exam. Review of past records reveals a chest x-ray 07/08/17 that showed hyperinflation and emphysematous changes.  Will begin empiric doxycycline. Followup with Family Doctor if not improved in one week.   Final Clinical Impressions(s) / UC Diagnoses   Final diagnoses:  [id number] virus infection     Discharge Instructions      Take plain guaifenesin (1200mg  extended release tabs such as Mucinex) twice daily, with plenty of water, for cough and congestion.  May add Pseudoephedrine (30mg , one or two every 4 to 6 hours) for sinus congestion.  Get adequate rest.   May take Delsym Cough Suppressant ("12 Hour Cough Relief") at bedtime for nighttime cough.  Try warm salt water gargles for sore throat.  Stop all antihistamines for now, and other non-prescription cough/cold preparations. May take Tylenol as needed for fever, body aches, etc.   Your COVID-19 test is positive.  Isolate yourself for five days from today.  At the end of five days you may end isolation if your symptoms have cleared or improved, and you have not had a fever for 24 hours. At this time you should wear a mask for five more days when you are around others.   If symptoms  become significantly worse during the night or over the weekend, proceed to the local emergency room.         ED Prescriptions     Medication Sig Dispense Auth. Provider   doxycycline (VIBRAMYCIN) 100 MG capsule Take one cap PO Q12hr with food. 14 capsule Kandra Nicolas, MD         Kandra Nicolas, MD 05/14/21 [id number]

## 2021-05-16 ENCOUNTER — Other Ambulatory Visit: Payer: Self-pay | Admitting: Gastroenterology

## 2021-05-17 ENCOUNTER — Ambulatory Visit: Payer: Medicare PPO

## 2021-05-17 NOTE — Telephone Encounter (Signed)
Left detailed VM for pt to call back and update [place] on her sxs.

## 2021-05-18 NOTE — Telephone Encounter (Signed)
Patient did not leave any details as Lovena Le asked her to do. I called patient back and received her voicemail again. I asked her again to call back with update on symptoms and okay to leave detailed message in voicemail.

## 2021-05-23 NOTE — Telephone Encounter (Signed)
Patient called to update. She did see her PCP and he "dealt with my issues".   She said he prescribed muscle relaxant for her neck pain. Patient said she was diagnosed with Covid 2 days after Christmas and has not been taking the Fosamax.  She questions since "early in the diagnosis would it be okay to take calcium with vitamin D instead?'  Review of her office visit showed  1."benign appearing digital mucous cyst left middle finger." 2. neck pain predominantly with some other arthralgias as well.  This seemed to start after starting Fosamax couple weeks ago.  We explained that musculoskeletal complaints including myalgias and potentially arthralgias can be seen with Fosamax.  I suggest that she leave off the Fosamax for a few weeks and if pains ceasing with that consider 1 more repeat trial.  If she continues to have musculoskeletal complaints on Fosamax recommend she follow-up with her GYN.

## 2021-05-24 NOTE — Telephone Encounter (Signed)
She doesn't have "early" bone loss, she has osteoporosis. Early bone loss would be osteopenia. Treatment beyond calcium and vit D are recommended for osteoporosis.  I agree with her primary care providers recommendation of seeing if the pain resolves off of Fosamax and then retry it to see if the pain returns. Medications like Fosamax are the #1 recommended treatment for osteoporosis. If she can't tolerate Fosamax, we can look at different options.  Ultimately the decision is hers. I'm also happy to send her to Endocrinology for another opinion if desired.

## 2021-05-24 NOTE — Telephone Encounter (Signed)
Spoke with patient and read her Dr. Gentry Fitz message.  She agrees with trying the Fosamax again. She will do that and let us [place] know.

## 2021-06-07 ENCOUNTER — Ambulatory Visit (INDEPENDENT_AMBULATORY_CARE_PROVIDER_SITE_OTHER): Payer: Medicare PPO

## 2021-06-07 ENCOUNTER — Other Ambulatory Visit: Payer: Self-pay

## 2021-06-07 DIAGNOSIS — Z1231 Encounter for screening mammogram for malignant neoplasm of breast: Secondary | ICD-10-CM | POA: Diagnosis not present

## 2021-06-26 DIAGNOSIS — F3341 Major depressive disorder, recurrent, in partial remission: Secondary | ICD-10-CM | POA: Diagnosis not present

## 2021-06-29 DIAGNOSIS — M542 Cervicalgia: Secondary | ICD-10-CM | POA: Diagnosis not present

## 2021-06-29 DIAGNOSIS — M810 Age-related osteoporosis without current pathological fracture: Secondary | ICD-10-CM | POA: Diagnosis not present

## 2021-07-04 ENCOUNTER — Ambulatory Visit (INDEPENDENT_AMBULATORY_CARE_PROVIDER_SITE_OTHER): Payer: Medicare PPO | Admitting: Internal Medicine

## 2021-07-04 ENCOUNTER — Encounter: Payer: Self-pay | Admitting: Internal Medicine

## 2021-07-04 VITALS — BP 118/78 | HR 68 | Temp 98.3°F | Ht 64.0 in | Wt 181.8 lb

## 2021-07-04 DIAGNOSIS — M810 Age-related osteoporosis without current pathological fracture: Secondary | ICD-10-CM

## 2021-07-04 DIAGNOSIS — Z79899 Other long term (current) drug therapy: Secondary | ICD-10-CM | POA: Diagnosis not present

## 2021-07-04 DIAGNOSIS — Z8616 Personal history of COVID-19: Secondary | ICD-10-CM | POA: Diagnosis not present

## 2021-07-04 DIAGNOSIS — Z23 Encounter for immunization: Secondary | ICD-10-CM

## 2021-07-04 DIAGNOSIS — E7800 Pure hypercholesterolemia, unspecified: Secondary | ICD-10-CM

## 2021-07-04 DIAGNOSIS — Z0000 Encounter for general adult medical examination without abnormal findings: Secondary | ICD-10-CM | POA: Diagnosis not present

## 2021-07-04 NOTE — Progress Notes (Signed)
Chief Complaint  Patient presents with   Annual Exam    Not fasting    HPI: Patient  [name] [name]  66 y.o. comes in today for Preventive Health Care visit   Last ov with me 6 2022 ruq pain last pv Jan 2022 has been doing fine Sees [facility] Dr Talbert Nan osteoprososis and was given alendronate ever she had what she feels a side effect when tried to separate times neck pain and itchy rash with bumps on her hands.  Took about a week to go away.  After stopping the medication. She is to have a follow-up with a specialist in osteoporosis. She did have COVID 19 infection in December and has recovered.  Health Maintenance  Topic Date Due   Zoster Vaccines- Shingrix (1 of 2) 01/01/2022 (Originally 01/05/1975)   COLONOSCOPY (Pts 45-4yrs Insurance coverage will need to be confirmed)  03/29/2023   TETANUS/TDAP  05/14/2023   MAMMOGRAM  06/08/2023   Pneumonia Vaccine 10+ Years old  Completed   INFLUENZA VACCINE  Completed   DEXA SCAN  Completed   COVID-19 Vaccine  Completed   Hepatitis C Screening  Completed   HPV VACCINES  Aged Out   Health Maintenance Review LIFESTYLE:  Exercise:  walk as much .  Checking silver sneakers . Tobacco/ETS: n Alcohol:  rare Sugar beverages:  diet soda  tea little  Sleep:7-8 now  Drug use: no HH of  1   no poets    ROS:  G REST of 12 system review negative except as per HPI   Past Medical History:  Diagnosis Date   Arthritis    Bunion    left foot   Depression    stable on medication   Diverticulitis 2015   Diverticulitis of colon without hemorrhage 09/28/2013   Diverticulosis    Gallstones    GERD (gastroesophageal reflux disease)    Hammer toes of both feet    Hyperlipidemia    no meds needed   IBS (irritable bowel syndrome)    Onychomycosis of toenail    Osteopenia    PUD (peptic ulcer disease) 1980's   Renal cyst    4.5-6 cm stable single  seen by uro in past    Past Surgical History:  Procedure Laterality Date   CHOLECYSTECTOMY   1987   COLONOSCOPY     IRRIGATION AND DEBRIDEMENT SEBACEOUS CYST Right 3/ 2014   chest wall   UPPER GASTROINTESTINAL ENDOSCOPY     WISDOM TOOTH EXTRACTION  age 57 & 5   lower teeth done first and upper wisdom teeth last    Family History  Problem Relation Age of Onset   Rheum arthritis Father    Pneumonia Father        deceased   Colon cancer Father 8   Heart attack Father    Breast cancer Sister 55   Colon polyps Sister    Breast cancer Paternal Grandmother 66   Osteoarthritis Mother    COPD Mother    Pneumonia Mother    Osteoporosis Mother    Cancer Maternal Grandmother        ? GI   Heart failure Maternal Grandfather    Heart failure Paternal Grandfather    Esophageal cancer Neg Hx    Rectal cancer Neg Hx    Stomach cancer Neg Hx     Social History   Socioeconomic History   Marital status: Divorced    Spouse name: Not on file   Number of children:  0   Years of education: Not on file   Highest education level: Not on file  Occupational History   Occupation: TEACHER    Employer: [employer]  Tobacco Use   Smoking status: Never   Smokeless tobacco: Never  Vaping Use   Vaping Use: Never used  Substance and Sexual Activity   Alcohol use: No   Drug use: No   Sexual activity: Not Currently    Birth control/protection: Post-menopausal  Other Topics Concern   Not on file  Social History Narrative   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   Non smoker    Hh of 1 ... 2 cats    Single   G0P0   Mom is now in assisted living and she has moved into her old residence   Social Determinants of Radio broadcast assistant Strain: Low Risk    Difficulty of Paying Living Expenses: Not hard at all  Food Insecurity: No Food Insecurity   Worried About Charity fundraiser in the Last Year: Never true   Arboriculturist in the Last Year: Never true  Transportation Needs: No Transportation Needs   Lack of Transportation (Medical): No   Lack  of Transportation (Non-Medical): No  Physical Activity: Insufficiently Active   Days of Exercise per Week: 3 days   Minutes of Exercise per Session: 30 min  Stress: No Stress Concern Present   Feeling of Stress : Not at all  Social Connections: Moderately Integrated   Frequency of Communication with Friends and Family: More than three times a week   Frequency of Social Gatherings with Friends and Family: More than three times a week   Attends Religious Services: More than 4 times per year   Active Member of [employer] or Organizations: Yes   Attends Music therapist: More than 4 times per year   Marital Status: Divorced    Outpatient Medications Prior to Visit  Medication Sig Dispense Refill   betamethasone valerate ointment (VALISONE) 0.1 % Apply a pea sized amount topically 1-2 x a week prn 30 g 0   Cholecalciferol (VITAMIN D PO) Take 2,000 mg by mouth every other day.      COVID-19 mRNA bivalent vaccine, Pfizer, (PFIZER COVID-19 VAC BIVALENT) injection Inject into the muscle. 0.3 mL 0   FLUoxetine (PROZAC) 10 MG tablet Take 5 mg by mouth daily.     FLUoxetine (PROZAC) 40 MG capsule Take 40 mg by mouth daily.     Multiple Vitamins-Minerals (CENTRUM ULTRA WOMENS PO) Take 1 tablet by mouth every other day.     omeprazole (PRILOSEC) 20 MG capsule Take 1 capsule by mouth once daily 90 capsule 0   Probiotic Product (PROBIOTIC PO) Take by mouth daily.     alendronate (FOSAMAX) 70 MG tablet Take 1 tablet (70 mg total) by mouth every 7 (seven) days. Take first thing in am with 6 oz. Water.  Be upright after taking.  Eat nothing for one hour. (Patient not taking: Reported on 07/04/2021) 12 tablet 3   nystatin-triamcinolone ointment (MYCOLOG) Apply 1 application topically 2 (two) times daily. (Patient not taking: Reported on 07/04/2021) 30 g 1   doxycycline (VIBRAMYCIN) 100 MG capsule Take one cap PO Q12hr with food. 14 capsule 0   methocarbamol (ROBAXIN) 500 MG tablet Take 1 tablet (500 mg  total) by mouth every 8 (eight) hours as needed for muscle spasms. 30 tablet 0   Facility-Administered Medications Prior to Visit  Medication  Dose Route Frequency Provider Last Rate Last Admin   0.9 %  sodium chloride infusion  500 mL Intravenous Once Danis, Estill Cotta III, MD         EXAM:  BP 118/78 (BP Location: Left Arm, Patient Position: Sitting, Cuff Size: Normal)    Pulse 68    Temp 98.3 F (36.8 C) (Oral)    Ht 5\' 4"  (1.626 m)    Wt 181 lb 12.8 oz (82.5 kg)    LMP 11/18/2004    SpO2 96%    BMI 31.21 kg/m   Body mass index is 31.21 kg/m. Wt Readings from Last 3 Encounters:  07/04/21 181 lb 12.8 oz (82.5 kg)  05/12/21 182 lb (82.6 kg)  05/10/21 183 lb 9.6 oz (83.3 kg)    Physical Exam: Vital signs reviewed VZD:GLOV is a well-developed well-nourished alert cooperative    who appearsr stated age in no acute distress.  HEENT: normocephalic atraumatic , Eyes: PERRL EOM's full, conjunctiva clear, Nares: paten,t no deformity discharge or tenderness., Ears: no deformity EAC's clear TMs with normal landmarks. Mouth: masked NECK: supple without masses, thyromegaly or bruits. CHEST/PULM:  Clear to auscultation and percussion breath sounds equal no wheeze , rales or rhonchi. No chest wall deformities or tenderness. Breast: normal by inspection . No dimpling, discharge, masses, tenderness or discharge . CV: PMI is nondisplaced, S1 S2 no gallops, murmurs, rubs. Peripheral pulses are full without delay.No JVD .  ABDOMEN: Bowel sounds normal nontender  No guard or rebound, no hepato splenomegal no CVA tenderness.  Extremtities:  No clubbing cyanosis or edema, no acute joint swelling or redness no focal atrophy NEURO:  Oriented x3, cranial nerves 3-12 appear to be intact, no obvious focal weakness,gait within normal limits no abnormal reflexes or asymmetrical SKIN: No acute rashes normal turgor, color, no bruising or petechiae. PSYCH: Oriented, good eye contact, no obvious depression anxiety,  cognition and judgment appear normal. LN: no cervical axillary inguinal adenopathy  Lab Results  Component Value Date   WBC 5.2 04/21/2021   HGB 13.8 04/21/2021   HCT 42.0 04/21/2021   PLT 325 04/21/2021   GLUCOSE 96 12/23/2020   CHOL 217 (H) 06/17/2020   TRIG 74.0 06/17/2020   HDL 76.00 06/17/2020   LDLDIRECT 132.7 03/24/2013   LDLCALC 126 (H) 06/17/2020   ALT 14 12/23/2020   AST 15 12/23/2020   NA 137 12/23/2020   K 4.2 12/23/2020   CL 101 12/23/2020   CREATININE 0.86 12/23/2020   BUN 18 12/23/2020   CO2 27 12/23/2020   TSH 1.57 06/17/2020   HGBA1C 5.9 06/17/2020    BP Readings from Last 3 Encounters:  07/04/21 118/78  05/12/21 130/86  05/10/21 120/60    Lab plan reviewed with patient nonfasting update  ASSESSMENT AND PLAN:  Discussed the following assessment and plan:    ICD-10-CM   1. Visit for preventive health examination  Z00.00 Pneumococcal polysaccharide vaccine 23-valent greater than or equal to 2yo subcutaneous/IM    2. Age related osteoporosis, unspecified pathological fracture presence  F64.3 Basic metabolic panel    Lipid panel    Hepatic function panel    TSH    TSH    Hepatic function panel    Lipid panel    Basic metabolic panel    3. Medication management  P29.518 Basic metabolic panel    Lipid panel    Hepatic function panel    TSH    TSH    Hepatic function panel  Lipid panel    Basic metabolic panel   had neck pain and itchy bump rash on hands on alendronate x 2 times     4. Hypercholesteremia  U13.24 Basic metabolic panel    Lipid panel    Hepatic function panel    TSH    TSH    Hepatic function panel    Lipid panel    Basic metabolic panel    5. History of 2019 novel coronavirus disease (COVID-19)  M01.02 Basic metabolic panel    Lipid panel    Hepatic function panel    TSH    TSH    Hepatic function panel    Lipid panel    Basic metabolic panel   december 2022      Shingrix , okay to get at pharmacy Pneumovax  23 today Would wait at least 6 months before considering a COVID vaccine and at this point data is missing as to benefit risk  Can consider coronary score if interested in future.  Continue lifestyle intervention Return in about 1 year (around 07/04/2022) for depending on results.  Patient Care Team: Mantaj Chamberlin, Standley Brooking, MD as PCP - General Kem Boroughs, FNP as Nurse Practitioner (Nurse Practitioner) Rolan Bucco, MD as Consulting Physician (Urology) Loletha Carrow Kirke Corin, MD as Consulting Physician (Gastroenterology) Alyson Ingles Candee Furbish, MD as Consulting Physician (Urology) Patient Instructions  Good to see you today  Pneumovax 23 vaccine  Ok to get shingrix vaccine  at your pharmacy .  When convenient  Continue  attention to lifestyle intervention healthy eating and exercise .   Agree with  follow with consult   with osteoporosis  specialist about options .   The 10-year ASCVD risk score (Arnett DK, et al., 2019) is: 4.9%   Values used to calculate the score:     Age: 17 years     Sex: Female     Is Non-Hispanic African American: No     Diabetic: No     Tobacco smoker: No     Systolic Blood Pressure: 725 mmHg     Is BP treated: No     HDL Cholesterol: 76 mg/dL     Total Cholesterol: 217 mg/dL   Standley Brooking. Ameerah Huffstetler M.D.

## 2021-07-04 NOTE — Patient Instructions (Addendum)
Good to see you today  Pneumovax 23 vaccine  Ok to get shingrix vaccine  at your pharmacy .  When convenient  Continue  attention to lifestyle intervention healthy eating and exercise .   Agree with  follow with consult   with osteoporosis  specialist about options .   The 10-year ASCVD risk score (Arnett DK, et al., 2019) is: 4.9%   Values used to calculate the score:     Age: 66 years     Sex: Female     Is Non-Hispanic African American: No     Diabetic: No     Tobacco smoker: No     Systolic Blood Pressure: 211 mmHg     Is BP treated: No     HDL Cholesterol: 76 mg/dL     Total Cholesterol: 217 mg/dL

## 2021-07-05 LAB — BASIC METABOLIC PANEL
BUN: 16 mg/dL (ref 6–23)
CO2: 24 mEq/L (ref 19–32)
Calcium: 9.8 mg/dL (ref 8.4–10.5)
Chloride: 101 mEq/L (ref 96–112)
Creatinine, Ser: 0.86 mg/dL (ref 0.40–1.20)
GFR: 70.36 mL/min (ref >60.00)
Glucose, Bld: 71 mg/dL (ref 70–99)
Potassium: 4.3 mEq/L (ref 3.5–5.1)
Sodium: 139 mEq/L (ref 135–145)

## 2021-07-05 LAB — LIPID PANEL
Cholesterol: 226 mg/dL — ABNORMAL HIGH (ref 0–200)
HDL: 86.8 mg/dL (ref >39.00)
LDL Cholesterol: 123 mg/dL — ABNORMAL HIGH (ref 0–99)
NonHDL: 139
Total CHOL/HDL Ratio: 3
Triglycerides: 80 mg/dL (ref 0.0–149.0)
VLDL: 16 mg/dL (ref 0.0–40.0)

## 2021-07-05 LAB — HEPATIC FUNCTION PANEL
ALT: 18 U/L (ref 0–35)
AST: 25 U/L (ref 0–37)
Albumin: 4.5 g/dL (ref 3.5–5.2)
Alkaline Phosphatase: 102 U/L (ref 39–117)
Bilirubin, Direct: 0 mg/dL (ref 0.0–0.3)
Total Bilirubin: 0.5 mg/dL (ref 0.2–1.2)
Total Protein: 8.5 g/dL — ABNORMAL HIGH (ref 6.0–8.3)

## 2021-07-05 LAB — TSH: TSH: 1.23 u[IU]/mL (ref 0.35–5.50)

## 2021-07-17 DIAGNOSIS — M542 Cervicalgia: Secondary | ICD-10-CM | POA: Diagnosis not present

## 2021-07-20 NOTE — Progress Notes (Signed)
Results stable or normal    (elevated TP is non specific and can be  reactive  and temporary.  )   10 years risk is still  in low category .    The 10-year ASCVD risk score (Arnett DK, et al., 2019) is: 4.7%   Values used to calculate the score:     Age: 66 years     Sex: Female     Is Non-Hispanic African American: No     Diabetic: No     Tobacco smoker: No     Systolic Blood Pressure: 741 mmHg     Is BP treated: No     HDL Cholesterol: 86.8 mg/dL     Total Cholesterol: 226 mg/dL

## 2021-07-27 DIAGNOSIS — F331 Major depressive disorder, recurrent, moderate: Secondary | ICD-10-CM | POA: Diagnosis not present

## 2021-07-27 DIAGNOSIS — F411 Generalized anxiety disorder: Secondary | ICD-10-CM | POA: Diagnosis not present

## 2021-08-09 DIAGNOSIS — F3341 Major depressive disorder, recurrent, in partial remission: Secondary | ICD-10-CM | POA: Diagnosis not present

## 2021-08-10 ENCOUNTER — Other Ambulatory Visit: Payer: Self-pay | Admitting: Gastroenterology

## 2021-08-10 DIAGNOSIS — H04123 Dry eye syndrome of bilateral lacrimal glands: Secondary | ICD-10-CM | POA: Diagnosis not present

## 2021-08-10 DIAGNOSIS — H524 Presbyopia: Secondary | ICD-10-CM | POA: Diagnosis not present

## 2021-08-10 DIAGNOSIS — H40053 Ocular hypertension, bilateral: Secondary | ICD-10-CM | POA: Diagnosis not present

## 2021-08-10 DIAGNOSIS — H2513 Age-related nuclear cataract, bilateral: Secondary | ICD-10-CM | POA: Diagnosis not present

## 2021-08-15 DIAGNOSIS — M810 Age-related osteoporosis without current pathological fracture: Secondary | ICD-10-CM | POA: Diagnosis not present

## 2021-08-15 DIAGNOSIS — E559 Vitamin D deficiency, unspecified: Secondary | ICD-10-CM | POA: Diagnosis not present

## 2021-08-15 LAB — HM DEXA SCAN

## 2021-08-17 DIAGNOSIS — M816 Localized osteoporosis [Lequesne]: Secondary | ICD-10-CM | POA: Diagnosis not present

## 2021-08-17 DIAGNOSIS — M810 Age-related osteoporosis without current pathological fracture: Secondary | ICD-10-CM | POA: Diagnosis not present

## 2021-08-18 ENCOUNTER — Encounter: Payer: Self-pay | Admitting: Internal Medicine

## 2021-09-11 DIAGNOSIS — M810 Age-related osteoporosis without current pathological fracture: Secondary | ICD-10-CM | POA: Diagnosis not present

## 2021-09-11 DIAGNOSIS — M542 Cervicalgia: Secondary | ICD-10-CM | POA: Diagnosis not present

## 2021-09-14 DIAGNOSIS — M810 Age-related osteoporosis without current pathological fracture: Secondary | ICD-10-CM | POA: Diagnosis not present

## 2021-09-14 DIAGNOSIS — E559 Vitamin D deficiency, unspecified: Secondary | ICD-10-CM | POA: Diagnosis not present

## 2021-09-20 DIAGNOSIS — F3341 Major depressive disorder, recurrent, in partial remission: Secondary | ICD-10-CM | POA: Diagnosis not present

## 2021-10-13 DIAGNOSIS — N281 Cyst of kidney, acquired: Secondary | ICD-10-CM | POA: Diagnosis not present

## 2021-10-13 DIAGNOSIS — R351 Nocturia: Secondary | ICD-10-CM | POA: Diagnosis not present

## 2021-10-23 ENCOUNTER — Ambulatory Visit: Payer: Medicare PPO | Admitting: Family Medicine

## 2021-10-23 ENCOUNTER — Encounter: Payer: Self-pay | Admitting: Family Medicine

## 2021-10-23 VITALS — BP 124/64 | HR 74 | Temp 99.0°F | Wt 184.2 lb

## 2021-10-23 DIAGNOSIS — J069 Acute upper respiratory infection, unspecified: Secondary | ICD-10-CM

## 2021-10-23 DIAGNOSIS — R059 Cough, unspecified: Secondary | ICD-10-CM

## 2021-10-23 LAB — POCT INFLUENZA A/B
Influenza A, POC: NEGATIVE
Influenza B, POC: NEGATIVE

## 2021-10-23 LAB — POC COVID19 BINAXNOW: SARS Coronavirus 2 Ag: NEGATIVE

## 2021-10-23 MED ORDER — FLUTICASONE PROPIONATE 50 MCG/ACT NA SUSP: 1.0000 | Freq: Every day | NASAL | 0 refills | Status: DC

## 2021-10-23 NOTE — Progress Notes (Signed)
Subjective:    Patient ID: [name] [name], female    DOB: 1955-06-22, 66 y.o.   MRN: [id number]  Chief Complaint  Patient presents with   Cough    And congestion with a little pressure under eyes for 4 days. Just took otc med from [facility] for sinus PE and allergy, did not help much. Sometimes cough produces thick milky mucus, in mornings.     HPI Patient was seen today for acute concern.  Pt endorses cough, more productive in am with thick whitish phlegm, chest pressure, mild rhinorrhea x 5 days.  Pt had a sore throat, R ear itching when symptoms began.  Patient had mild facial pressure underneath eyes this morning but has since resolved.  Patient denies headache, fever, chills, nausea, vomiting, diarrhea, sick contacts.  Patient tried an OTC sinus, PE, allergy pill.  Past Medical History:  Diagnosis Date   Arthritis    Bunion    left foot   Depression    stable on medication   Diverticulitis 2015   Diverticulitis of colon without hemorrhage 09/28/2013   Diverticulosis    Gallstones    GERD (gastroesophageal reflux disease)    Hammer toes of both feet    Hyperlipidemia    no meds needed   IBS (irritable bowel syndrome)    Onychomycosis of toenail    Osteopenia    PUD (peptic ulcer disease) 1980's   Renal cyst    4.5-6 cm stable single  seen by uro in past    Allergies  Allergen Reactions   Tylenol [Acetaminophen] Other (See Comments)    "Irritates my esophagus"    ROS General: Denies fever, chills, night sweats, changes in weight, changes in appetite HEENT: Denies headaches, ear pain, changes in vision  +Rhinorrhea.  Sinus pressure, R ear pruritis, maxillary facial pressure resolved CV: Denies CP, palpitations, SOB, orthopnea Pulm: Denies SOB, cough, wheezing  +cough GI: Denies abdominal pain, nausea, vomiting, diarrhea, constipation GU: Denies dysuria, hematuria, frequency, vaginal discharge Msk: Denies muscle cramps, joint pains Neuro: Denies weakness, numbness,  tingling Skin: Denies rashes, bruising Psych: Denies depression, anxiety, hallucinations  Objective:    Blood pressure 124/64, pulse 74, temperature 99 F (37.2 C), temperature source Oral, weight 184 lb 3.2 oz (83.6 kg), last menstrual period 11/18/2004, SpO2 95 %.  Gen. Pleasant, well-nourished, in no distress, normal affect   HEENT: [place]/AT, face symmetric, conjunctiva clear, no scleral icterus, PERRLA, EOMI, no TTP maxillary, frontal, ethmoid sinuses.  Nares patent with clear drainage, pharynx with postnasal drainage, no erythema or exudate.  Bilateral TMs full.  Right canal with scant dried cerumen.  No cervical lymphadenopathy. Lungs: Cough.  No accessory muscle use, CTAB, no wheezes or rales Cardiovascular: RRR, no m/r/g, no peripheral edema Musculoskeletal: No deformities, no cyanosis or clubbing, normal tone Neuro:  A&Ox3, CN II-XII intact, normal gait Skin:  Warm, no lesions/ rash   Wt Readings from Last 3 Encounters:  10/23/21 184 lb 3.2 oz (83.6 kg)  07/04/21 181 lb 12.8 oz (82.5 kg)  05/12/21 182 lb (82.6 kg)    Lab Results  Component Value Date   WBC 5.2 04/21/2021   HGB 13.8 04/21/2021   HCT 42.0 04/21/2021   PLT 325 04/21/2021   GLUCOSE 71 07/04/2021   CHOL 226 (H) 07/04/2021   TRIG 80.0 07/04/2021   HDL 86.80 07/04/2021   LDLDIRECT 132.7 03/24/2013   LDLCALC 123 (H) 07/04/2021   ALT 18 07/04/2021   AST 25 07/04/2021   NA 139 07/04/2021  K 4.3 07/04/2021   CL 101 07/04/2021   CREATININE 0.86 07/04/2021   BUN 16 07/04/2021   CO2 24 07/04/2021   TSH 1.23 07/04/2021   HGBA1C 5.9 06/17/2020    Assessment/Plan:  Viral URI  -COVID and flu testing negative -Symptoms likely 2/2 other viral etiology -Discussed supportive care including OTC cough/cold medications, rest, hydration, gargling with warm salt water Chloraseptic spray, etc. -Start Flonase nasal spray as needed -Given precautions - Plan: fluticasone (FLONASE) 50 MCG/ACT nasal spray  Cough,  unspecified type -COVID and flu testing negative -Continue supportive care  - Plan: POC COVID-19, POC Influenza A/B  F/u as needed for continued or worsening symptoms.  Grier Mitts, MD

## 2021-11-01 DIAGNOSIS — F3341 Major depressive disorder, recurrent, in partial remission: Secondary | ICD-10-CM | POA: Diagnosis not present

## 2021-11-13 DIAGNOSIS — M810 Age-related osteoporosis without current pathological fracture: Secondary | ICD-10-CM | POA: Diagnosis not present

## 2021-11-20 ENCOUNTER — Encounter: Payer: Self-pay | Admitting: Obstetrics and Gynecology

## 2021-11-20 DIAGNOSIS — N9089 Other specified noninflammatory disorders of vulva and perineum: Secondary | ICD-10-CM

## 2021-11-20 NOTE — Telephone Encounter (Signed)
AEX 08/10/20. No visit scheduled.

## 2021-11-22 MED ORDER — BETAMETHASONE VALERATE 0.1 % EX OINT: TOPICAL_OINTMENT | CUTANEOUS | 0 refills | Status: DC

## 2021-12-06 ENCOUNTER — Telehealth: Payer: Self-pay

## 2021-12-06 NOTE — Telephone Encounter (Signed)
11/22/21 from Dr. Talbert Nan: "[name] [name], I've sent in one refill of the valisone for you. Just be careful not to overuse it. Using it 1-2 x a week is okay, intermittently you can use it 2 x a day for a week, but it is not for daily use. If you have having issues that aren't resolving with the valisone, then please make an appointment. Have a great summer! Sumner Boast"  Message returned unread. I left message for patient to call so I can read it to her or she can login to My Chart to read.

## 2021-12-12 NOTE — Telephone Encounter (Signed)
Last read by Frederico Hamman at 11:06 AM on 12/06/2021.

## 2021-12-19 DIAGNOSIS — M25562 Pain in left knee: Secondary | ICD-10-CM | POA: Diagnosis not present

## 2022-01-23 DIAGNOSIS — M25562 Pain in left knee: Secondary | ICD-10-CM | POA: Diagnosis not present

## 2022-01-24 DIAGNOSIS — F3341 Major depressive disorder, recurrent, in partial remission: Secondary | ICD-10-CM | POA: Diagnosis not present

## 2022-01-28 ENCOUNTER — Other Ambulatory Visit: Payer: Self-pay | Admitting: Gastroenterology

## 2022-02-06 DIAGNOSIS — M1712 Unilateral primary osteoarthritis, left knee: Secondary | ICD-10-CM | POA: Diagnosis not present

## 2022-02-08 ENCOUNTER — Other Ambulatory Visit: Payer: Self-pay | Admitting: Gastroenterology

## 2022-02-08 DIAGNOSIS — F331 Major depressive disorder, recurrent, moderate: Secondary | ICD-10-CM | POA: Diagnosis not present

## 2022-02-08 DIAGNOSIS — F411 Generalized anxiety disorder: Secondary | ICD-10-CM | POA: Diagnosis not present

## 2022-02-15 ENCOUNTER — Telehealth: Payer: Self-pay | Admitting: Gastroenterology

## 2022-02-15 ENCOUNTER — Other Ambulatory Visit: Payer: Self-pay | Admitting: Gastroenterology

## 2022-02-15 NOTE — Telephone Encounter (Signed)
Inbound call from patient stating she has tried to refill her omeprazole medication twice and the pharmacy still has not received it. Patient last seen in the office 12/23/21. Please advise.  Thank you

## 2022-02-16 MED ORDER — OMEPRAZOLE 20 MG PO CPDR: 20.0000 mg | DELAYED_RELEASE_CAPSULE | Freq: Every day | ORAL | 0 refills | Status: DC

## 2022-02-16 NOTE — Telephone Encounter (Signed)
Patient was last seen 12-2020. Patient needs yearly follow up appointment. 90 day supply provided.

## 2022-02-28 DIAGNOSIS — F3341 Major depressive disorder, recurrent, in partial remission: Secondary | ICD-10-CM | POA: Diagnosis not present

## 2022-03-09 ENCOUNTER — Other Ambulatory Visit (HOSPITAL_BASED_OUTPATIENT_CLINIC_OR_DEPARTMENT_OTHER): Payer: Self-pay

## 2022-03-09 MED ORDER — COMIRNATY 30 MCG/0.3ML IM SUSY
PREFILLED_SYRINGE | INTRAMUSCULAR | 0 refills | Status: DC
  Filled 2022-03-09: qty 0.3, 1d supply, fill #0

## 2022-03-23 ENCOUNTER — Encounter: Payer: Self-pay | Admitting: Family Medicine

## 2022-03-23 ENCOUNTER — Ambulatory Visit: Payer: Medicare PPO | Admitting: Family Medicine

## 2022-03-23 VITALS — BP 132/82 | HR 79 | Temp 98.4°F | Wt 186.8 lb

## 2022-03-23 DIAGNOSIS — S29011A Strain of muscle and tendon of front wall of thorax, initial encounter: Secondary | ICD-10-CM | POA: Diagnosis not present

## 2022-03-23 DIAGNOSIS — W109XXA Fall (on) (from) unspecified stairs and steps, initial encounter: Secondary | ICD-10-CM | POA: Diagnosis not present

## 2022-03-23 DIAGNOSIS — R0789 Other chest pain: Secondary | ICD-10-CM

## 2022-03-23 NOTE — Progress Notes (Signed)
Subjective:    Patient ID: [name] [name], female    DOB: [birth year], 66 y.o.   MRN: [id number]  Chief Complaint  Patient presents with   [place] a wk ago, fell down the last step on porch. Left side hurts, breast hurts, maybe ribs. Had muscle relaxer from a previous visit, but did not do any good.     HPI Patient was seen today for acute concern.  Patient missed the last step on the porch, falling onto the ground 8 days ago.  Pt landed on her back/left side.  Patient able to get up on her own and ambulatory after injury.  Patient endorses soreness with certain movements starting that day.  Symptoms worse at night when tossing and turning in bed.  Denies bruising, SOB, pain with inhalation.  Tried leftover muscle relaxer one night without relief.  Patient has not tried anything else for symptoms.  Past Medical History:  Diagnosis Date   Arthritis    Bunion    left foot   Depression    stable on medication   Diverticulitis 2015   Diverticulitis of colon without hemorrhage 09/28/2013   Diverticulosis    Gallstones    GERD (gastroesophageal reflux disease)    Hammer toes of both feet    Hyperlipidemia    no meds needed   IBS (irritable bowel syndrome)    Onychomycosis of toenail    Osteopenia    PUD (peptic ulcer disease) 1980's   Renal cyst    4.5-6 cm stable single  seen by uro in past    Allergies  Allergen Reactions   Tylenol [Acetaminophen] Other (See Comments)    "Irritates my esophagus"    ROS General: Denies fever, chills, night sweats, changes in weight, changes in appetite  + fall HEENT: Denies headaches, ear pain, changes in vision, rhinorrhea, sore throat CV: Denies CP, palpitations, SOB, orthopnea Pulm: Denies SOB, cough, wheezing GI: Denies abdominal pain, nausea, vomiting, diarrhea, constipation GU: Denies dysuria, hematuria, frequency, vaginal discharge Msk: Denies muscle cramps, joint pains + left-sided rib/chest pain Neuro: Denies weakness,  numbness, tingling Skin: Denies rashes, bruising Psych: Denies depression, anxiety, hallucinations  Objective:    Blood pressure 132/82, pulse 79, temperature 98.4 F (36.9 C), temperature source Oral, weight 186 lb 12.8 oz (84.7 kg), last menstrual period 11/18/2004, SpO2 99 %.  Gen. Pleasant, well-nourished, in no distress, normal affect   HEENT: [place]/AT, face symmetric, conjunctiva clear, no scleral icterus, PERRLA, EOMI, nares patent without drainage Lungs: no accessory muscle use, CTAB, no wheezes or rales Cardiovascular: RRR, no m/r/g, no peripheral edema Musculoskeletal: No step-offs, TTP of cervical, thoracic, lumbar spine or paraspinal muscles, left shoulder.  Mild TTP of L lateral pectoralis muscle proximal and inferior axilla.  No increased pain with deep inhalation.  No deformities, no cyanosis or clubbing, normal tone Neuro:  A&Ox3, CN II-XII intact, normal gait Skin:  Warm, dry, intact, no ecchymosis, no lesions/ rash   Wt Readings from Last 3 Encounters:  10/23/21 184 lb 3.2 oz (83.6 kg)  07/04/21 181 lb 12.8 oz (82.5 kg)  05/12/21 182 lb (82.6 kg)    Lab Results  Component Value Date   WBC 5.2 04/21/2021   HGB 13.8 04/21/2021   HCT 42.0 04/21/2021   PLT 325 04/21/2021   GLUCOSE 71 07/04/2021   CHOL 226 (H) 07/04/2021   TRIG 80.0 07/04/2021   HDL 86.80 07/04/2021   LDLDIRECT 132.7 03/24/2013   LDLCALC 123 (H) 07/04/2021  ALT 18 07/04/2021   AST 25 07/04/2021   NA 139 07/04/2021   K 4.3 07/04/2021   CL 101 07/04/2021   CREATININE 0.86 07/04/2021   BUN 16 07/04/2021   CO2 24 07/04/2021   TSH 1.23 07/04/2021   HGBA1C 5.9 06/17/2020    Assessment/Plan:  Chest wall pain  Muscle strain of chest wall, initial encounter  Fall on or from stairs or steps, initial encounter  Discussed soreness with certain movements likely 2/2 muscle strain caused by twisting motion during fall.  Advised likely to be sore for several weeks.  Less concern for rib fracture as  patient able to take deep breaths in discomfort, no deformities, or tenderness to palpation of ribs on exam.  Supportive care including topical analgesics, ice, heat, NSAIDs, stretching advised.  Patient advised to avoid prolonged sitting as may feel worse/stiff.  Patient encouraged to take in regular deep breaths to help prevent atelectasis.  Given strict precautions.  Given handouts.  F/u as needed with PCP  Grier Mitts, MD

## 2022-03-26 DIAGNOSIS — M1712 Unilateral primary osteoarthritis, left knee: Secondary | ICD-10-CM | POA: Diagnosis not present

## 2022-04-09 DIAGNOSIS — F3341 Major depressive disorder, recurrent, in partial remission: Secondary | ICD-10-CM | POA: Diagnosis not present

## 2022-04-25 ENCOUNTER — Ambulatory Visit (INDEPENDENT_AMBULATORY_CARE_PROVIDER_SITE_OTHER): Payer: Medicare PPO

## 2022-04-25 VITALS — BP 120/62 | HR 62 | Temp 98.1°F | Ht 64.0 in | Wt 184.6 lb

## 2022-04-25 DIAGNOSIS — Z0000 Encounter for general adult medical examination without abnormal findings: Secondary | ICD-10-CM

## 2022-04-25 NOTE — Progress Notes (Addendum)
Subjective:   [name] [name] is a 66 y.o. female who presents for Medicare Annual (Subsequent) preventive examination.  Review of Systems      Cardiac Risk Factors include: advanced age (>37mn, >>7women);obesity (BMI >30kg/m2)     Objective:    Today's Vitals   04/25/22 1519  BP: 120/62  Pulse: 62  Temp: 98.1 F (36.7 C)  TempSrc: Oral  SpO2: 95%  Weight: 184 lb 9.6 oz (83.7 kg)  Height: '5\' 4"'$  (1.626 m)   Body mass index is 31.69 kg/m.     04/25/2022    3:36 PM 04/24/2021    3:44 PM  Advanced Directives  Does Patient Have a Medical Advance Directive? Yes Yes  Type of AParamedicof AStanfordLiving will Living will  Does patient want to make changes to medical advance directive?  No - Patient declined  Copy of [place] Chart? No - copy requested     Current Medications (verified) Outpatient Encounter Medications as of 04/25/2022  Medication Sig   alendronate (FOSAMAX) 70 MG tablet Take 1 tablet (70 mg total) by mouth every 7 (seven) days. Take first thing in am with 6 oz. Water.  Be upright after taking.  Eat nothing for one hour. (Patient not taking: Reported on 07/04/2021)   betamethasone valerate ointment (VALISONE) 0.1 % Apply a pea sized amount topically 1-2 x a week prn   Cholecalciferol (VITAMIN D PO) Take 2,000 mg by mouth every other day.    COVID-19 mRNA bivalent vaccine, Pfizer, (PFIZER COVID-19 VAC BIVALENT) injection Inject into the muscle.   COVID-19 mRNA vaccine 2023-2024 (COMIRNATY) syringe Inject into the muscle.   FLUoxetine (PROZAC) 10 MG tablet Take 5 mg by mouth daily.   FLUoxetine (PROZAC) 40 MG capsule Take 40 mg by mouth daily.   fluticasone (FLONASE) 50 MCG/ACT nasal spray Place 1 spray into both nostrils daily. (Patient not taking: Reported on 03/23/2022)   Multiple Vitamins-Minerals (CENTRUM ULTRA WOMENS PO) Take 1 tablet by mouth every other day.   nystatin-triamcinolone ointment (MYCOLOG)  Apply 1 application topically 2 (two) times daily. (Patient not taking: Reported on 03/23/2022)   omeprazole (PRILOSEC) 20 MG capsule Take 1 capsule (20 mg total) by mouth daily. Please schedule a yearly follow up for continued medication management   Probiotic Product (PROBIOTIC PO) Take by mouth daily.   Facility-Administered Encounter Medications as of 04/25/2022  Medication   0.9 %  sodium chloride infusion    Allergies (verified) Tylenol [acetaminophen]   History: Past Medical History:  Diagnosis Date   Arthritis    Bunion    left foot   Depression    stable on medication   Diverticulitis 2015   Diverticulitis of colon without hemorrhage 09/28/2013   Diverticulosis    Gallstones    GERD (gastroesophageal reflux disease)    Hammer toes of both feet    Hyperlipidemia    no meds needed   IBS (irritable bowel syndrome)    Onychomycosis of toenail    Osteopenia    PUD (peptic ulcer disease) 1980's   Renal cyst    4.5-6 cm stable single  seen by uro in past   Past Surgical History:  Procedure Laterality Date   CHOLECYSTECTOMY  1987   COLONOSCOPY     IRRIGATION AND DEBRIDEMENT SEBACEOUS CYST Right 3/ 2014   chest wall   UPPER GASTROINTESTINAL ENDOSCOPY     WISDOM TOOTH EXTRACTION  age 66& 530  lower teeth  done first and upper wisdom teeth last   Family History  Problem Relation Age of Onset   Rheum arthritis Father    Pneumonia Father        deceased   Colon cancer Father 84   Heart attack Father    Breast cancer Sister 74   Colon polyps Sister    Breast cancer Paternal Grandmother 62   Osteoarthritis Mother    COPD Mother    Pneumonia Mother    Osteoporosis Mother    Cancer Maternal Grandmother        ? GI   Heart failure Maternal Grandfather    Heart failure Paternal Grandfather    Esophageal cancer Neg Hx    Rectal cancer Neg Hx    Stomach cancer Neg Hx    Social History   Socioeconomic History   Marital status: Divorced    Spouse name: Not on  file   Number of children: 0   Years of education: Not on file   Highest education level: Not on file  Occupational History   Occupation: TEACHER    Employer: [employer]  Tobacco Use   Smoking status: Never   Smokeless tobacco: Never  Vaping Use   Vaping Use: Never used  Substance and Sexual Activity   Alcohol use: No   Drug use: No   Sexual activity: Not Currently    Birth control/protection: Post-menopausal  Other Topics Concern   Not on file  Social History Narrative   Teacher media specialist graduate degree Oncologist.   Non smoker    Hh of 1 ... 2 cats    Single   G0P0   Mom is now in assisted living and she has moved into her old residence   Social Determinants of Health   Financial Resource Strain: Low Risk  (04/25/2022)   Overall Financial Resource Strain (CARDIA)    Difficulty of Paying Living Expenses: Not hard at all  Food Insecurity: No Food Insecurity (04/25/2022)   Hunger Vital Sign    Worried About Running Out of Food in the Last Year: Never true    [place] in the Last Year: Never true  Transportation Needs: No Transportation Needs (04/25/2022)   PRAPARE - Hydrologist (Medical): No    Lack of Transportation (Non-Medical): No  Physical Activity: Insufficiently Active (04/25/2022)   Exercise Vital Sign    Days of Exercise per Week: 3 days    Minutes of Exercise per Session: 30 min  Stress: Stress Concern Present (04/25/2022)   [place]    Feeling of Stress : To some extent  Social Connections: Moderately Integrated (04/25/2022)   Social Connection and Isolation Panel [NHANES]    Frequency of Communication with Friends and Family: More than three times a week    Frequency of Social Gatherings with Friends and Family: More than three times a week    Attends Religious Services: More than 4 times per year    Active Member of [employer] or  Organizations: Yes    Attends Music therapist: More than 4 times per year    Marital Status: Divorced    Tobacco Counseling Counseling given: Not Answered   Clinical Intake:  Pre-visit preparation completed: No        BMI - recorded: 31.69 Nutritional Status: BMI > 30  Obese Nutritional Risks: None Diabetes: No  How often do you need to have someone  help you when you read instructions, pamphlets, or other written materials from your doctor or pharmacy?: 1 - Never  Diabetic?  No  Interpreter Needed?: No  Information entered by :: Rolene Arbour LPN   Activities of Daily Living    04/25/2022    3:33 PM  In your present state of health, do you have any difficulty performing the following activities:  Hearing? 0  Vision? 0  Difficulty concentrating or making decisions? 0  Walking or climbing stairs? 0  Dressing or bathing? 0  Doing errands, shopping? 0  Preparing Food and eating ? N  Using the Toilet? N  In the past six months, have you accidently leaked urine? N  Do you have problems with loss of bowel control? N  Managing your Medications? N  Managing your Finances? N  Housekeeping or managing your Housekeeping? N    Patient Care Team: Panosh, Standley Brooking, MD as PCP - General Kem Boroughs, FNP as Nurse Practitioner (Nurse Practitioner) Rolan Bucco, MD as Consulting Physician (Urology) Loletha Carrow Kirke Corin, MD as Consulting Physician (Gastroenterology) Alyson Ingles Candee Furbish, MD as Consulting Physician (Urology)  Indicate any recent Medical Services you may have received from other than [facility] providers in the past year (date may be approximate).     Assessment:   This is a routine wellness examination for Kariss.  Hearing/Vision screen Hearing Screening - Comments:: Denies hearing difficulties   Vision Screening - Comments:: Wears reading glasses - up to date with routine eye exams withDr Big    Dietary issues and exercise activities  discussed: Exercise limited by: None identified   Goals Addressed               This Visit's Progress     Increase physical activity (pt-stated)        Patient states would like to lose weight. Plans to go to the [facility].       Depression Screen    04/25/2022    3:31 PM 03/23/2022    8:37 AM 10/23/2021    4:00 PM 07/04/2021    2:29 PM 04/24/2021    3:37 PM 04/17/2017    3:30 PM  PHQ 2/9 Scores  PHQ - 2 Score 0 0 0 0 0 0  PHQ- 9 Score 0 0 0 0      Fall Risk    04/25/2022    3:33 PM 03/23/2022    8:50 AM 03/23/2022    8:36 AM 10/23/2021    4:00 PM 07/04/2021    2:28 PM  [place] in the past year? '1 1 1 '$ 0 1  Number falls in past yr: 0 0 0 0 1  Injury with Fall? 1 1 0 0 0  Comment Fall followed by medical attention. Injury Pulled muscle.      Risk for fall due to : No Fall Risks  Other (Comment) No Fall Risks   Follow up Falls prevention discussed  Falls evaluation completed Falls evaluation completed     [place]:  Any stairs in or around the home? No  If so, are there any without handrails? No  Home free of loose throw rugs in walkways, pet beds, electrical cords, etc? Yes  Adequate lighting in your home to reduce risk of falls? Yes   ASSISTIVE DEVICES UTILIZED TO PREVENT FALLS:  Life alert? No  Use of a cane, walker or w/c? No  Grab bars in the bathroom? Yes  Shower chair  or bench in shower? No  Elevated toilet seat or a handicapped toilet? No   TIMED UP AND GO:  Was the test performed? Yes .  Length of time to ambulate 10 feet: 10 sec.   Gait steady and fast without use of assistive device  Cognitive Function:        04/25/2022    3:36 PM 04/24/2021    3:42 PM  6CIT Screen  What Year? 0 points 0 points  What month? 0 points 0 points  What time? 0 points 0 points  Count back from 20 0 points 0 points  Months in reverse 0 points 0 points  Repeat phrase 0 points 0 points  Total Score 0 points 0 points     Immunizations Immunization History  Administered Date(s) Administered   COVID-19, mRNA, vaccine(Comirnaty)12 years and older 03/09/2022   Fluad Quad(high Dose 65+) 03/01/2020, 02/15/2021   Influenza,inj,Quad PF,6+ Mos 03/19/2019   PFIZER(Purple Top)SARS-COV-2 Vaccination 08/22/2019, 09/16/2019, 03/05/2020   Pfizer Covid-19 Vaccine Bivalent Booster 61yr & up 03/20/2021   Pneumococcal Conjugate-13 06/01/2020   Pneumococcal Polysaccharide-23 07/04/2021   Td 05/21/2001   Tdap 05/13/2013    TDAP status: Up to date  Flu Vaccine status: Up to date  Pneumococcal vaccine status: Up to date  Covid-19 vaccine status: Completed vaccines  Qualifies for Shingles Vaccine? Yes   Zostavax completed No   Shingrix Completed?: No.    Education has been provided regarding the importance of this vaccine. Patient has been advised to call insurance company to determine out of pocket expense if they have not yet received this vaccine. Advised may also receive vaccine at local pharmacy or Health Dept. Verbalized acceptance and understanding.  Screening Tests Health Maintenance  Topic Date Due   Zoster Vaccines- Shingrix (1 of 2) 07/25/2022 (Originally 01/05/1975)   INFLUENZA VACCINE  08/19/2022 (Originally 12/19/2021)   COVID-19 Vaccine (6 - 2023-24 season) 05/04/2022   COLONOSCOPY (Pts 45-474yrInsurance coverage will need to be confirmed)  03/29/2023   Medicare Annual Wellness (AWV)  04/26/2023   DTaP/Tdap/Td (3 - Td or Tdap) 05/14/2023   MAMMOGRAM  06/08/2023   Pneumonia Vaccine 6571Years old  Completed   DEXA SCAN  Completed   Hepatitis C Screening  Completed   HPV VACCINES  Aged Out    Health Maintenance  There are no preventive care reminders to display for this patient.   Colorectal cancer screening: Type of screening: Colonoscopy. Completed 03/28/18. Repeat every 5 years  Mammogram status: Completed 06/07/21. Repeat every year  Bone Density status: Completed 08/15/21. Results  reflect: Bone density results: OSTEOPOROSIS. Repeat every   years.  Lung Cancer Screening: (Low Dose CT Chest recommended if Age 67-80ears, 30 pack-year currently smoking OR have quit w/in 15years.) does not qualify.     Additional Screening:  Hepatitis C Screening: does qualify; Completed 07/04/16  Vision Screening: Recommended annual ophthalmology exams for early detection of glaucoma and other disorders of the eye. Is the patient up to date with their annual eye exam?  Yes  Who is the provider or what is the name of the office in which the patient attends annual eye exams? Dr BiJerry Carasf pt is not established with a provider, would they like to be referred to a provider to establish care? No .   Dental Screening: Recommended annual dental exams for proper oral hygiene  Community Resource Referral / Chronic Care Management:  CRR required this visit?  No   CCM required this visit?  No  Plan:     I have personally reviewed and noted the following in the patient's chart:   Medical and social history Use of alcohol, tobacco or illicit drugs  Current medications and supplements including opioid prescriptions. Patient is not currently taking opioid prescriptions. Functional ability and status Nutritional status Physical activity Advanced directives List of other physicians Hospitalizations, surgeries, and ER visits in previous 12 months Vitals Screenings to include cognitive, depression, and falls Referrals and appointments  In addition, I have reviewed and discussed with patient certain preventive protocols, quality metrics, and best practice recommendations. A written personalized care plan for preventive services as well as general preventive health recommendations were provided to patient.     Criselda Peaches, LPN   14/08/8183   Nurse Notes: None

## 2022-04-25 NOTE — Patient Instructions (Addendum)
Ms. [name] [name] , Thank you for taking time to come for your Medicare Wellness Visit. I appreciate your ongoing commitment to your health goals. Please review the following plan we discussed and let me know if I can assist you in the future.   These are the goals we discussed:  Goals       Increase physical activity (pt-stated)      Patient states would like to lose weight. Plans to go [facility] the [facility].        This is a list of the screening recommended for you and due dates:  Health Maintenance  Topic Date Due   Zoster (Shingles) Vaccine (1 of 2) 07/25/2022*   Flu Shot  08/19/2022*   COVID-19 Vaccine (6 - 2023-24 season) 05/04/2022   Colon Cancer Screening  03/29/2023   Medicare Annual Wellness Visit  04/26/2023   DTaP/Tdap/Td vaccine (3 - Td or Tdap) 05/14/2023   Mammogram  06/08/2023   Pneumonia Vaccine  Completed   DEXA scan (bone density measurement)  Completed   Hepatitis C Screening: USPSTF Recommendation to screen - Ages 17-79 yo.  Completed   HPV Vaccine  Aged Out  *Topic was postponed. The date shown is not the original due date.    Advanced directives: Please bring a copy of your health care power of attorney and living will to the office to be added to your chart at your convenience.   Conditions/risks identified: None  Next appointment: Follow up in one year for your annual wellness visit    Preventive Care 65 Years and Older, Female Preventive care refers to lifestyle choices and visits with your health care provider that can promote health and wellness. What does preventive care include? A yearly physical exam. This is also called an annual well check. Dental exams once or twice a year. Routine eye exams. Ask your health care provider how often you should have your eyes checked. Personal lifestyle choices, including: Daily care of your teeth and gums. Regular physical activity. Eating a healthy diet. Avoiding tobacco and drug use. Limiting alcohol use. Practicing  safe sex. Taking low-dose aspirin every day. Taking vitamin and mineral supplements as recommended by your health care provider. What happens during an annual well check? The services and screenings done by your health care provider during your annual well check will depend on your age, overall health, lifestyle risk factors, and family history of disease. Counseling  Your health care provider may ask you questions about your: Alcohol use. Tobacco use. Drug use. Emotional well-being. Home and relationship well-being. Sexual activity. Eating habits. History of falls. Memory and ability to understand (cognition). Work and work Statistician. Reproductive health. Screening  You may have the following tests or measurements: Height, weight, and BMI. Blood pressure. Lipid and cholesterol levels. These may be checked every 5 years, or more frequently if you are over 75 years old. Skin check. Lung cancer screening. You may have this screening every year starting at age 4 if you have a 30-pack-year history of smoking and currently smoke or have quit within the past 15 years. Fecal occult blood test (FOBT) of the stool. You may have this test every year starting at age 4. Flexible sigmoidoscopy or colonoscopy. You may have a sigmoidoscopy every 5 years or a colonoscopy every 10 years starting at age 5. Hepatitis C blood test. Hepatitis B blood test. Sexually transmitted disease (STD) testing. Diabetes screening. This is done by checking your blood sugar (glucose) after you have not eaten for  a while (fasting). You may have this done every 1-3 years. Bone density scan. This is done to screen for osteoporosis. You may have this done starting at age 27. Mammogram. This may be done every 1-2 years. Talk to your health care provider about how often you should have regular mammograms. Talk with your health care provider about your test results, treatment options, and if necessary, the need for more  tests. Vaccines  Your health care provider may recommend certain vaccines, such as: Influenza vaccine. This is recommended every year. Tetanus, diphtheria, and acellular pertussis (Tdap, Td) vaccine. You may need a Td booster every 10 years. Zoster vaccine. You may need this after age 21. Pneumococcal 13-valent conjugate (PCV13) vaccine. One dose is recommended after age 61. Pneumococcal polysaccharide (PPSV23) vaccine. One dose is recommended after age 6. Talk to your health care provider about which screenings and vaccines you need and how often you need them. This information is not intended to replace advice given to you by your health care provider. Make sure you discuss any questions you have with your health care provider. Document Released: 06/03/2015 Document Revised: 01/25/2016 Document Reviewed: 03/08/2015 Elsevier Interactive Patient Education  2017 [employer] Prevention in the Home Falls can cause injuries. They can happen to people of all ages. There are many things you can do to make your home safe and to help prevent falls. What can I do on the outside of my home? Regularly fix the edges of walkways and driveways and fix any cracks. Remove anything that might make you trip as you walk through a door, such as a raised step or threshold. Trim any bushes or trees on the path to your home. Use bright outdoor lighting. Clear any walking paths of anything that might make someone trip, such as rocks or tools. Regularly check to see if handrails are loose or broken. Make sure that both sides of any steps have handrails. Any raised decks and porches should have guardrails on the edges. Have any leaves, snow, or ice cleared regularly. Use sand or salt on walking paths during winter. Clean up any spills in your garage right away. This includes oil or grease spills. What can I do in the bathroom? Use night lights. Install grab bars by the toilet and in the tub and shower.  Do not use towel bars as grab bars. Use non-skid mats or decals in the tub or shower. If you need to sit down in the shower, use a plastic, non-slip stool. Keep the floor dry. Clean up any water that spills on the floor as soon as it happens. Remove soap buildup in the tub or shower regularly. Attach bath mats securely with double-sided non-slip rug tape. Do not have throw rugs and other things on the floor that can make you trip. What can I do in the bedroom? Use night lights. Make sure that you have a light by your bed that is easy to reach. Do not use any sheets or blankets that are too big for your bed. They should not hang down onto the floor. Have a firm chair that has side arms. You can use this for support while you get dressed. Do not have throw rugs and other things on the floor that can make you trip. What can I do in the kitchen? Clean up any spills right away. Avoid walking on wet floors. Keep items that you use a lot in easy-to-reach places. If you need to reach something above  you, use a strong step stool that has a grab bar. Keep electrical cords out of the way. Do not use floor polish or wax that makes floors slippery. If you must use wax, use non-skid floor wax. Do not have throw rugs and other things on the floor that can make you trip. What can I do with my stairs? Do not leave any items on the stairs. Make sure that there are handrails on both sides of the stairs and use them. Fix handrails that are broken or loose. Make sure that handrails are as long as the stairways. Check any carpeting to make sure that it is firmly attached to the stairs. Fix any carpet that is loose or worn. Avoid having throw rugs at the top or bottom of the stairs. If you do have throw rugs, attach them to the floor with carpet tape. Make sure that you have a light switch at the top of the stairs and the bottom of the stairs. If you do not have them, ask someone to add them for you. What else  can I do to help prevent falls? Wear shoes that: Do not have high heels. Have rubber bottoms. Are comfortable and fit you well. Are closed at the toe. Do not wear sandals. If you use a stepladder: Make sure that it is fully opened. Do not climb a closed stepladder. Make sure that both sides of the stepladder are locked into place. Ask someone to hold it for you, if possible. Clearly mark and make sure that you can see: Any grab bars or handrails. First and last steps. Where the edge of each step is. Use tools that help you move around (mobility aids) if they are needed. These include: Canes. Walkers. Scooters. Crutches. Turn on the lights when you go into a dark area. Replace any light bulbs as soon as they burn out. Set up your furniture so you have a clear path. Avoid moving your furniture around. If any of your floors are uneven, fix them. If there are any pets around you, be aware of where they are. Review your medicines with your doctor. Some medicines can make you feel dizzy. This can increase your chance of falling. Ask your doctor what other things that you can do to help prevent falls. This information is not intended to replace advice given to you by your health care provider. Make sure you discuss any questions you have with your health care provider. Document Released: 03/03/2009 Document Revised: 10/13/2015 Document Reviewed: 06/11/2014 Elsevier Interactive Patient Education  2017 [employer].

## 2022-04-26 ENCOUNTER — Other Ambulatory Visit: Payer: Self-pay | Admitting: Obstetrics and Gynecology

## 2022-04-26 DIAGNOSIS — Z1231 Encounter for screening mammogram for malignant neoplasm of breast: Secondary | ICD-10-CM

## 2022-05-14 ENCOUNTER — Other Ambulatory Visit: Payer: Self-pay | Admitting: Gastroenterology

## 2022-05-16 ENCOUNTER — Ambulatory Visit (INDEPENDENT_AMBULATORY_CARE_PROVIDER_SITE_OTHER): Payer: Medicare PPO | Admitting: Orthopaedic Surgery

## 2022-05-16 DIAGNOSIS — M2392 Unspecified internal derangement of left knee: Secondary | ICD-10-CM | POA: Diagnosis not present

## 2022-05-16 DIAGNOSIS — F4323 Adjustment disorder with mixed anxiety and depressed mood: Secondary | ICD-10-CM | POA: Diagnosis not present

## 2022-05-16 NOTE — Progress Notes (Signed)
Chief Complaint: Left knee pain     History of Present Illness:    [name] [name] is a 66 y.o. female presents today with ongoing left knee pain since July of this year.  She not have any specific known injury although she did feel the pain increasingly when she was getting into a car.  She feels like it will occasionally grab or get stuck.  She has a history of a Baker's cyst which was previously diagnosed by an orthopedic surgeon in [place].  She was given a shot of steroid as well as hyaluronic acid none of which provided significant relief.  She feels like the knee is giving out at this time.  She is here today for further assessment.  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    Surgical History:   none  PMH/PSH/Family History/Social History/Meds/Allergies:    Past Medical History:  Diagnosis Date   Arthritis    Bunion    left foot   Depression    stable on medication   Diverticulitis 2015   Diverticulitis of colon without hemorrhage 09/28/2013   Diverticulosis    Gallstones    GERD (gastroesophageal reflux disease)    Hammer toes of both feet    Hyperlipidemia    no meds needed   IBS (irritable bowel syndrome)    Onychomycosis of toenail    Osteopenia    PUD (peptic ulcer disease) 1980's   Renal cyst    4.5-6 cm stable single  seen by uro in past   Past Surgical History:  Procedure Laterality Date   CHOLECYSTECTOMY  1987   COLONOSCOPY     IRRIGATION AND DEBRIDEMENT SEBACEOUS CYST Right 3/ 2014   chest wall   UPPER GASTROINTESTINAL ENDOSCOPY     WISDOM TOOTH EXTRACTION  age 49 & 1   lower teeth done first and upper wisdom teeth last   Social History   Socioeconomic History   Marital status: Divorced    Spouse name: Not on file   Number of children: 0   Years of education: Not on file   Highest education level: Not on file  Occupational History   Occupation: TEACHER     Employer: [employer]  Tobacco Use   Smoking status: Never   Smokeless tobacco: Never  Vaping Use   Vaping Use: Never used  Substance and Sexual Activity   Alcohol use: No   Drug use: No   Sexual activity: Not Currently    Birth control/protection: Post-menopausal  Other Topics Concern   Not on file  Social History Narrative   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   Non smoker    Hh of 1 ... 2 cats    Single   G0P0   Mom is now in assisted living and she has moved into her old residence   Social Determinants of Health   Financial Resource Strain: Low Risk  (04/25/2022)   Overall Financial Resource Strain (CARDIA)    Difficulty of Paying Living Expenses: Not hard at all  Food Insecurity: No Food Insecurity (04/25/2022)   Hunger Vital Sign    Worried About Running Out of Food in the Last Year: Never true    Ran Out of Food in the Last Year: Never true  Transportation Needs: No Transportation Needs (04/25/2022)   PRAPARE - Hydrologist (Medical): No    Lack of Transportation (Non-Medical): No  Physical Activity: Insufficiently Active (04/25/2022)   Exercise Vital Sign    Days of Exercise per Week: 3 days    Minutes of Exercise per Session: 30 min  Stress: Stress Concern Present (04/25/2022)   [place]    Feeling of Stress : To some extent  Social Connections: Moderately Integrated (04/25/2022)   Social Connection and Isolation Panel [NHANES]    Frequency of Communication with Friends and Family: More than three times a week    Frequency of Social Gatherings with Friends and Family: More than three times a week    Attends Religious Services: More than 4 times per year    Active Member of [employer] or Organizations: Yes    Attends Music therapist: More than 4 times per year    Marital Status: Divorced   Family History  Problem Relation Age of  Onset   Rheum arthritis Father    Pneumonia Father        deceased   Colon cancer Father 49   Heart attack Father    Breast cancer Sister 32   Colon polyps Sister    Breast cancer Paternal Grandmother 50   Osteoarthritis Mother    COPD Mother    Pneumonia Mother    Osteoporosis Mother    Cancer Maternal Grandmother        ? GI   Heart failure Maternal Grandfather    Heart failure Paternal Grandfather    Esophageal cancer Neg Hx    Rectal cancer Neg Hx    Stomach cancer Neg Hx    Allergies  Allergen Reactions   Tylenol [Acetaminophen] Other (See Comments)    "Irritates my esophagus"   Current Outpatient Medications  Medication Sig Dispense Refill   alendronate (FOSAMAX) 70 MG tablet Take 1 tablet (70 mg total) by mouth every 7 (seven) days. Take first thing in am with 6 oz. Water.  Be upright after taking.  Eat nothing for one hour. (Patient not taking: Reported on 07/04/2021) 12 tablet 3   betamethasone valerate ointment (VALISONE) 0.1 % Apply a pea sized amount topically 1-2 x a week prn 30 g 0   Cholecalciferol (VITAMIN D PO) Take 2,000 mg by mouth every other day.      COVID-19 mRNA bivalent vaccine, Pfizer, (PFIZER COVID-19 VAC BIVALENT) injection Inject into the muscle. 0.3 mL 0   COVID-19 mRNA vaccine 2023-2024 (COMIRNATY) syringe Inject into the muscle. 0.3 mL 0   FLUoxetine (PROZAC) 10 MG tablet Take 5 mg by mouth daily.     FLUoxetine (PROZAC) 40 MG capsule Take 40 mg by mouth daily.     fluticasone (FLONASE) 50 MCG/ACT nasal spray Place 1 spray into both nostrils daily. (Patient not taking: Reported on 03/23/2022) 16 g 0   Multiple Vitamins-Minerals (CENTRUM ULTRA WOMENS PO) Take 1 tablet by mouth every other day.     nystatin-triamcinolone ointment (MYCOLOG) Apply 1 application topically 2 (two) times daily. (Patient not taking: Reported on 03/23/2022) 30 g 1   omeprazole (PRILOSEC) 20 MG capsule Take 1 capsule (20 mg total) by mouth daily. Please schedule a yearly  follow up for continued medication management 90 capsule 0   Probiotic Product (PROBIOTIC PO) Take by mouth daily.     Current Facility-Administered Medications  Medication Dose Route Frequency Provider  Last Rate Last Admin   0.9 %  sodium chloride infusion  500 mL Intravenous Once Nelida Meuse III, MD       No results found.  Review of Systems:   A ROS was performed including pertinent positives and negatives as documented in the HPI.  Physical Exam :   Constitutional: NAD and appears stated age Neurological: Alert and oriented Psych: Appropriate affect and cooperative Last menstrual period 11/18/2004.   Comprehensive Musculoskeletal Exam:    Left knee with tenderness palpation about the medial joint space with positive McMurray.  Trace effusion, negative laxity with varus or valgus stress negative Lachman range of motion from -3 to 130 degrees  Imaging:   Xray (left knee 4 views): Very mild joint space narrowing medially otherwise well-preserved knee    I personally reviewed and interpreted the radiographs.   Assessment:   66 y.o. female with medial joint line tenderness and mechanical symptoms which I am concerned for in the setting of a possible meniscal injury.  Given the fact that she has swelling as well as difficulty weightbearing with a positive McMurray I do believe that she would benefit from an MRI of the left knee and I will see her back following discuss results  Plan :    -Plan for MRI of the left knee and follow-up to discuss results     I personally saw and evaluated the patient, and participated in the management and treatment plan.  Vanetta Mulders, MD Attending Physician, Orthopedic Surgery  This document was dictated using Dragon voice recognition software. A reasonable attempt at proof reading has been made to minimize errors.

## 2022-05-17 DIAGNOSIS — M898X7 Other specified disorders of bone, ankle and foot: Secondary | ICD-10-CM | POA: Diagnosis not present

## 2022-05-17 DIAGNOSIS — B351 Tinea unguium: Secondary | ICD-10-CM | POA: Diagnosis not present

## 2022-05-17 DIAGNOSIS — M2022 Hallux rigidus, left foot: Secondary | ICD-10-CM | POA: Diagnosis not present

## 2022-05-17 DIAGNOSIS — M7742 Metatarsalgia, left foot: Secondary | ICD-10-CM | POA: Diagnosis not present

## 2022-05-30 DIAGNOSIS — M810 Age-related osteoporosis without current pathological fracture: Secondary | ICD-10-CM | POA: Diagnosis not present

## 2022-06-13 ENCOUNTER — Ambulatory Visit (INDEPENDENT_AMBULATORY_CARE_PROVIDER_SITE_OTHER): Payer: Medicare PPO

## 2022-06-13 ENCOUNTER — Ambulatory Visit (HOSPITAL_BASED_OUTPATIENT_CLINIC_OR_DEPARTMENT_OTHER): Payer: Medicare PPO | Admitting: Orthopaedic Surgery

## 2022-06-13 DIAGNOSIS — Z1231 Encounter for screening mammogram for malignant neoplasm of breast: Secondary | ICD-10-CM | POA: Diagnosis not present

## 2022-06-14 ENCOUNTER — Ambulatory Visit (HOSPITAL_COMMUNITY)
Admission: RE | Admit: 2022-06-14 | Discharge: 2022-06-14 | Disposition: A | Discharge status: Home / Self Care | Payer: Medicare PPO | Source: Ambulatory Visit | Attending: Orthopaedic Surgery | Admitting: Orthopaedic Surgery

## 2022-06-14 DIAGNOSIS — M2392 Unspecified internal derangement of left knee: Secondary | ICD-10-CM | POA: Diagnosis not present

## 2022-06-14 DIAGNOSIS — M25562 Pain in left knee: Secondary | ICD-10-CM | POA: Diagnosis not present

## 2022-06-22 ENCOUNTER — Encounter: Payer: Self-pay | Admitting: Gastroenterology

## 2022-06-22 ENCOUNTER — Ambulatory Visit: Payer: Medicare PPO | Admitting: Gastroenterology

## 2022-06-22 VITALS — BP 122/74 | HR 88 | Ht 64.0 in | Wt 187.4 lb

## 2022-06-22 DIAGNOSIS — L290 Pruritus ani: Secondary | ICD-10-CM | POA: Diagnosis not present

## 2022-06-22 DIAGNOSIS — K219 Gastro-esophageal reflux disease without esophagitis: Secondary | ICD-10-CM | POA: Diagnosis not present

## 2022-06-22 MED ORDER — FAMOTIDINE 40 MG PO TABS: 40.0000 mg | ORAL_TABLET | Freq: Two times a day (BID) | ORAL | 1 refills | Status: DC

## 2022-06-22 NOTE — Patient Instructions (Addendum)
Pruritis Ani treatment:  Citrucel fiber one tablespoon once daily if stools are irregular.  Minimize coffee, alcohol, tomatoes, citrus fruits.  Avoid any wipe or ointment with hydrocortisone.  Use un-medicated , moist, flushable toilet wipe ("toddler wipe") at first after bowel movement, then blot dry with soft cloth instead of toilet paper.    A small amount of Zinc Oxide (desitin, balmex) cream can be applied afterwards to protect the sensitive skin near the anal canal. ___________________________________________  _______________________________________________________  If your blood pressure at your visit was 140/90 or greater, please contact your primary care physician to follow up on this.  _______________________________________________________  If you are age 61 or older, your body mass index should be between 23-30. Your Body mass index is 32.16 kg/m. If this is out of the aforementioned range listed, please consider follow up with your Primary Care Provider.  If you are age 49 or younger, your body mass index should be between 19-25. Your Body mass index is 32.16 kg/m. If this is out of the aformentioned range listed, please consider follow up with your Primary Care Provider.   ________________________________________________________  The [facility] providers would like to encourage you to use [facility] to communicate with providers for non-urgent requests or questions.  Due to long hold times on the telephone, sending your provider a message by [facility] may be a faster and more efficient way to get a response.  Please allow 48 business hours for a response.  Please remember that this is for non-urgent requests.  _______________________________________________________  Dennis Bast will be due for a recall EGD in 03-2023. We will send you a reminder in the mail when it gets closer to that time.   It was a pleasure to see you today!  Thank you for trusting me with your gastrointestinal  care!

## 2022-06-22 NOTE — Progress Notes (Signed)
[facility] progress note:  History: [name] [name] 06/22/2022  Referring provider: Burnis Medin, MD  Reason for consult/chief complaint: Gastroesophageal Reflux (Been taking OTC Prilosec and not sure it's working as well as Rx, medication check)   Subjective  HPI: Summary of pertinent GI issues: Longstanding GERD stable on PPI.  Last upper endoscopy October 2009. Office visit checkup August 2022, patient also complaining of intermittent right upper quadrant pain.  Previous CT scan had shown incidental interposition of the colon between the liver and diaphragm.  Repeat CT scan ordered (report below), and that abnormality was no longer described.  Abdominal ultrasound July 2022 normal and showing prior cholecystectomy. History of colon cancer in patient's father -no polyps on colonoscopy with Dr. Loletha Carrow November 2019. ___________  [name] [name] has generally been feeling well.  She switched to OTC Prilosec when she ran out of the prescription for this visit, and feels that it probably does not work quite as well has prescriptions at the same dose.  She gets some breakthrough dyspepsia during the day and has been taking Tums for that.  She is also concerned about the possibility of omeprazole contributing to her osteoporosis, for which she takes calcium plus vitamin D and has had 2 Prolia treatments.  Denies dysphagia or odynophagia.  She also has some perianal itching lately and tried some Preparation H on it and that was not helpful.  Suspects it is hemorrhoids and would like to be examined.   ROS:  Review of Systems Denies chest pain or dyspnea  Past Medical History: Past Medical History:  Diagnosis Date   Arthritis    Bunion    left foot   Depression    stable on medication   Diverticulitis 2015   Diverticulitis of colon without hemorrhage 09/28/2013   Diverticulosis    Gallstones    GERD (gastroesophageal reflux disease)    Hammer toes of both feet     Hyperlipidemia    no meds needed   IBS (irritable bowel syndrome)    Meniscal injury    Tear   Onychomycosis of toenail    Osteopenia    Osteoporosis    PUD (peptic ulcer disease) 1980's   Renal cyst    4.5-6 cm stable single  seen by uro in past     Past Surgical History: Past Surgical History:  Procedure Laterality Date   CHOLECYSTECTOMY  1987   COLONOSCOPY     IRRIGATION AND DEBRIDEMENT SEBACEOUS CYST Right 3/ 2014   chest wall   UPPER GASTROINTESTINAL ENDOSCOPY     WISDOM TOOTH EXTRACTION  age 43 & 28   lower teeth done first and upper wisdom teeth last     Family History: Family History  Problem Relation Age of Onset   Rheum arthritis Father    Pneumonia Father        deceased   Colon cancer Father 77   Heart attack Father    Breast cancer Sister 39   Colon polyps Sister    Breast cancer Paternal Grandmother 52   Osteoarthritis Mother    COPD Mother    Pneumonia Mother    Osteoporosis Mother    Cancer Maternal Grandmother        ? GI   Heart failure Maternal Grandfather    Heart failure Paternal Grandfather    Esophageal cancer Neg Hx    Rectal cancer Neg Hx    Stomach cancer Neg Hx     Social History: Social History  Socioeconomic History   Marital status: Divorced    Spouse name: Not on file   Number of children: 0   Years of education: Not on file   Highest education level: Not on file  Occupational History   Occupation: TEACHER    Employer: [employer]  Tobacco Use   Smoking status: Never   Smokeless tobacco: Never  Vaping Use   Vaping Use: Never used  Substance and Sexual Activity   Alcohol use: No   Drug use: No   Sexual activity: Not Currently    Birth control/protection: Post-menopausal  Other Topics Concern   Not on file  Social History Narrative   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   Non smoker    Hh of 1 ... 2 cats    Single   G0P0   Mom is now in assisted living and she has moved into  her old residence   Social Determinants of Health   Financial Resource Strain: Low Risk  (04/25/2022)   Overall Financial Resource Strain (CARDIA)    Difficulty of Paying Living Expenses: Not hard at all  Food Insecurity: No Food Insecurity (04/25/2022)   Hunger Vital Sign    Worried About Running Out of Food in the Last Year: Never true    [place] in the Last Year: Never true  Transportation Needs: No Transportation Needs (04/25/2022)   PRAPARE - Hydrologist (Medical): No    Lack of Transportation (Non-Medical): No  Physical Activity: Insufficiently Active (04/25/2022)   Exercise Vital Sign    Days of Exercise per Week: 3 days    Minutes of Exercise per Session: 30 min  Stress: Stress Concern Present (04/25/2022)   [place]    Feeling of Stress : To some extent  Social Connections: Moderately Integrated (04/25/2022)   Social Connection and Isolation Panel [NHANES]    Frequency of Communication with Friends and Family: More than three times a week    Frequency of Social Gatherings with Friends and Family: More than three times a week    Attends Religious Services: More than 4 times per year    Active Member of [employer] or Organizations: Yes    Attends Music therapist: More than 4 times per year    Marital Status: Divorced    Allergies: Allergies  Allergen Reactions   Fosamax [Alendronate Sodium] Other (See Comments)    Intense pain   Tylenol [Acetaminophen] Other (See Comments)    "Irritates my esophagus"    Outpatient Meds: Current Outpatient Medications  Medication Sig Dispense Refill   betamethasone valerate ointment (VALISONE) 0.1 % Apply a pea sized amount topically 1-2 x a week prn 30 g 0   Calcium Carb-Cholecalciferol (CALCIUM 600+D3 PO) Take 1 tablet by mouth daily.     Cholecalciferol (VITAMIN D PO) Take 2,000 mg by mouth every other day.      denosumab  (PROLIA) 60 MG/ML SOSY injection Inject 60 mg into the skin every 6 (six) months.     famotidine (PEPCID) 40 MG tablet Take 1 tablet (40 mg total) by mouth 2 (two) times daily. 60 tablet 1   FLUoxetine (PROZAC) 10 MG tablet Take 5 mg by mouth daily.     FLUoxetine (PROZAC) 40 MG capsule Take 40 mg by mouth daily.     fluticasone (FLONASE) 50 MCG/ACT nasal spray Place 1 spray into both nostrils daily. 16 g 0  Misc. Devices MISC Apply 1 Dose topically daily. Salicylic acid for toe nails     Multiple Vitamins-Minerals (CENTRUM ULTRA WOMENS PO) Take 1 tablet by mouth every other day.     nystatin-triamcinolone ointment (MYCOLOG) Apply 1 application topically 2 (two) times daily. 30 g 1   omeprazole (PRILOSEC) 20 MG capsule Take 1 capsule (20 mg total) by mouth daily. Please schedule a yearly follow up for continued medication management 90 capsule 0   Probiotic Product (PROBIOTIC PO) Take by mouth daily.     Current Facility-Administered Medications  Medication Dose Route Frequency Provider Last Rate Last Admin   0.9 %  sodium chloride infusion  500 mL Intravenous Once Doran Stabler, MD          ___________________________________________________________________ Objective   Exam:  BP 122/74 (BP Location: Left Arm, Patient Position: Sitting, Cuff Size: Normal)   Pulse 88   Ht '5\' 4"'$  (1.626 m)   Wt 187 lb 6 oz (85 kg)   LMP 11/18/2004   BMI 32.16 kg/m  Wt Readings from Last 3 Encounters:  06/22/22 187 lb 6 oz (85 kg)  04/25/22 184 lb 9.6 oz (83.7 kg)  03/23/22 186 lb 12.8 oz (84.7 kg)    General: Well-appearing, normal vocal quality  GI: soft, no tenderness, with active bowel sounds. No guarding or palpable organomegaly noted. Perianal skin looks normal, no hemorrhoids or neoplasia.  Normal DRE without tenderness, fissure or palpable internal lesion.    Radiologic Studies:  CLINICAL DATA:  Right upper quadrant pain for 3 months.   EXAM: CT ABDOMEN WITH CONTRAST    TECHNIQUE: Multidetector CT imaging of the abdomen was performed using the standard protocol following bolus administration of intravenous contrast.   CONTRAST:  77m OMNIPAQUE IOHEXOL 350 MG/ML SOLN   COMPARISON:  11/24/2020 abdominal ultrasound. Abdominal MRI 08/28/2019. 09/28/2013 abdominopelvic CT.   FINDINGS: Lower chest: Mild left base scarring. Normal heart size without pericardial or pleural effusion.   Hepatobiliary: High right hepatic lobe 1.2 cm lesion was similar in 2015 and consistent with a meningioma. There is an adjacent subcentimeter lesion on 08/02 which is also present in 2015, favored to represent a hemangioma. Mild caudate lobe enlargement, without specific evidence of cirrhosis. Cholecystectomy, without biliary ductal dilatation.   Pancreas: Normal, without mass or ductal dilatation.   Spleen: Normal in size, without focal abnormality.   Adrenals/Urinary Tract: Normal adrenal glands. Normal right kidney. Dominant 5.9 cm upper pole left renal lesion was characterized on prior MRI. No hydronephrosis.   Stomach/Bowel: Proximal gastric underdistention. Apparent wall thickening, including on 19/2 is at least partially secondary. Normal abdominal large and small bowel loops.   Vascular/Lymphatic: Normal caliber of the aorta and branch vessels. No retroperitoneal or retrocrural adenopathy.   Other: No ascites. Tiny fat containing ventral abdominal wall hernia.   Musculoskeletal: Lower lumbar spondylosis.   IMPRESSION: 1.  No acute process or explanation for right upper quadrant pain. 2. Proximal gastric underdistention. Apparent wall thickening is at least partially secondary. If gastritis is a clinical consideration, consider endoscopy.     Electronically Signed   By: KAbigail MiyamotoM.D.   On: 01/09/2021 09:16  Assessment: Encounter Diagnoses  Name Primary?   Gastroesophageal reflux disease, unspecified whether esophagitis present Yes   Pruritus  ani     Chronic stable reflux symptoms, no red flag signs.  Symptoms not quite as well-controlled on OTC equivalent dose PPI.  Also with concerns about possible calcium malabsorption and risk of osteoporosis from  the PPI. Recommended changing to famotidine 40 mg twice daily to see if she can have reasonable symptom control without concerns related to calcium absorption. If it does not seem to work well, then she will let me know and we can go back to omeprazole 20 mg daily as long she continues treatment for osteoporosis.  EGD when she has a neck scheduled colonoscopy with me in November 2019.  Evaluate for Barrett's esophagus.  Recall placed.  Pruritus ani, some written advice given regarding avoidance of common food triggers and local treatment.  Thank you for the courtesy of this consult.  Please call me with any questions or concerns.  Nelida Meuse III  CC: Referring provider noted above

## 2022-06-27 ENCOUNTER — Ambulatory Visit (INDEPENDENT_AMBULATORY_CARE_PROVIDER_SITE_OTHER): Payer: Medicare PPO | Admitting: Orthopaedic Surgery

## 2022-06-27 DIAGNOSIS — M23322 Other meniscus derangements, posterior horn of medial meniscus, left knee: Secondary | ICD-10-CM | POA: Diagnosis not present

## 2022-06-27 DIAGNOSIS — M2392 Unspecified internal derangement of left knee: Secondary | ICD-10-CM

## 2022-06-27 NOTE — Progress Notes (Signed)
Chief Complaint: Left knee pain     History of Present Illness:    06/27/2022: [name] [name] presents today for MRI follow-up of her left knee.  The knee continues to be painful and swollen and feel as though it is unstable.  [name] [name] is a 67 y.o. female presents today with ongoing left knee pain since July of this year.  She not have any specific known injury although she did feel the pain increasingly when she was getting into a car.  She feels like it will occasionally grab or get stuck.  She has a history of a Baker's cyst which was previously diagnosed by an orthopedic surgeon in [place].  She was given a shot of steroid as well as hyaluronic acid none of which provided significant relief.  She feels like the knee is giving out at this time.  She is here today for further assessment.  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    Surgical History:   none  PMH/PSH/Family History/Social History/Meds/Allergies:    Past Medical History:  Diagnosis Date   Arthritis    Bunion    left foot   Depression    stable on medication   Diverticulitis 2015   Diverticulitis of colon without hemorrhage 09/28/2013   Diverticulosis    Gallstones    GERD (gastroesophageal reflux disease)    Hammer toes of both feet    Hyperlipidemia    no meds needed   IBS (irritable bowel syndrome)    Meniscal injury    Tear   Onychomycosis of toenail    Osteopenia    Osteoporosis    PUD (peptic ulcer disease) 1980's   Renal cyst    4.5-6 cm stable single  seen by uro in past   Past Surgical History:  Procedure Laterality Date   CHOLECYSTECTOMY  1987   COLONOSCOPY     IRRIGATION AND DEBRIDEMENT SEBACEOUS CYST Right 3/ 2014   chest wall   UPPER GASTROINTESTINAL ENDOSCOPY     WISDOM TOOTH EXTRACTION  age 6 & 48   lower teeth done first and upper wisdom teeth last   Social History   Socioeconomic History    Marital status: Divorced    Spouse name: Not on file   Number of children: 0   Years of education: Not on file   Highest education level: Not on file  Occupational History   Occupation: TEACHER    Employer: [employer]  Tobacco Use   Smoking status: Never   Smokeless tobacco: Never  Vaping Use   Vaping Use: Never used  Substance and Sexual Activity   Alcohol use: No   Drug use: No   Sexual activity: Not Currently    Birth control/protection: Post-menopausal  Other Topics Concern   Not on file  Social History Narrative   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   Non smoker    Hh of 1 ... 2 cats    Single   G0P0   Mom is now in assisted living and she has moved into her old residence   Social Determinants of Health   Financial Resource Strain: Low Risk  (04/25/2022)   Overall Financial Resource Strain (CARDIA)    Difficulty of Paying Living Expenses: Not hard at all  Food Insecurity: No Food Insecurity (04/25/2022)   Hunger Vital Sign    Worried About Running Out of Food in the Last Year: Never true    Ran Out of Food in the Last Year: Never true  Transportation Needs: No Transportation Needs (04/25/2022)   PRAPARE - Hydrologist (Medical): No    Lack of Transportation (Non-Medical): No  Physical Activity: Insufficiently Active (04/25/2022)   Exercise Vital Sign    Days of Exercise per Week: 3 days    Minutes of Exercise per Session: 30 min  Stress: Stress Concern Present (04/25/2022)   [place]    Feeling of Stress : To some extent  Social Connections: Moderately Integrated (04/25/2022)   Social Connection and Isolation Panel [NHANES]    Frequency of Communication with Friends and Family: More than three times a week    Frequency of Social Gatherings with Friends and Family: More than three times a week    Attends Religious Services: More than 4 times  per year    Active Member of [employer] or Organizations: Yes    Attends Music therapist: More than 4 times per year    Marital Status: Divorced   Family History  Problem Relation Age of Onset   Rheum arthritis Father    Pneumonia Father        deceased   Colon cancer Father 24   Heart attack Father    Breast cancer Sister 51   Colon polyps Sister    Breast cancer Paternal Grandmother 17   Osteoarthritis Mother    COPD Mother    Pneumonia Mother    Osteoporosis Mother    Cancer Maternal Grandmother        ? GI   Heart failure Maternal Grandfather    Heart failure Paternal Grandfather    Esophageal cancer Neg Hx    Rectal cancer Neg Hx    Stomach cancer Neg Hx    Allergies  Allergen Reactions   Fosamax [Alendronate Sodium] Other (See Comments)    Intense pain   Tylenol [Acetaminophen] Other (See Comments)    "Irritates my esophagus"   Current Outpatient Medications  Medication Sig Dispense Refill   betamethasone valerate ointment (VALISONE) 0.1 % Apply a pea sized amount topically 1-2 x a week prn 30 g 0   Calcium Carb-Cholecalciferol (CALCIUM 600+D3 PO) Take 1 tablet by mouth daily.     Cholecalciferol (VITAMIN D PO) Take 2,000 mg by mouth every other day.      denosumab (PROLIA) 60 MG/ML SOSY injection Inject 60 mg into the skin every 6 (six) months.     famotidine (PEPCID) 40 MG tablet Take 1 tablet (40 mg total) by mouth 2 (two) times daily. 60 tablet 1   FLUoxetine (PROZAC) 10 MG tablet Take 5 mg by mouth daily.     FLUoxetine (PROZAC) 40 MG capsule Take 40 mg by mouth daily.     fluticasone (FLONASE) 50 MCG/ACT nasal spray Place 1 spray into both nostrils daily. 16 g 0   Misc. Devices MISC Apply 1 Dose topically daily. Salicylic acid for toe nails     Multiple Vitamins-Minerals (CENTRUM ULTRA WOMENS PO) Take 1 tablet by mouth every other day.     nystatin-triamcinolone ointment (MYCOLOG) Apply 1 application topically 2 (two) times daily. 30 g 1    omeprazole (PRILOSEC) 20 MG capsule Take 1 capsule (20 mg total) by mouth daily. Please schedule a  yearly follow up for continued medication management 90 capsule 0   Probiotic Product (PROBIOTIC PO) Take by mouth daily.     Current Facility-Administered Medications  Medication Dose Route Frequency Provider Last Rate Last Admin   0.9 %  sodium chloride infusion  500 mL Intravenous Once Nelida Meuse III, MD       No results found.  Review of Systems:   A ROS was performed including pertinent positives and negatives as documented in the HPI.  Physical Exam :   Constitutional: NAD and appears stated age Neurological: Alert and oriented Psych: Appropriate affect and cooperative Last menstrual period 11/18/2004.   Comprehensive Musculoskeletal Exam:    Left knee with tenderness palpation about the medial joint space with positive McMurray.  Trace effusion, negative laxity with varus or valgus stress negative Lachman range of motion from -3 to 130 degrees  Imaging:   Xray (left knee 4 views): Very mild joint space narrowing medially otherwise well-preserved knee  Right left knee: There is a tear of the posterior medial meniscal root with some degenerative findings predominantly about the medial tibiofemoral joint  I personally reviewed and interpreted the radiographs.   Assessment:   67 y.o. female with right knee medial meniscal root tear.  I did describe that this is in the setting of moderate degenerative changes about the medial joint.  At this time she is persistently feeling like the knee is unstable which I did describe is likely result of her meniscal insufficiency as the meniscus is a secondary stabilizer despite intact cruciate ligaments.  Overall I did discuss that her situation is somewhat tricky as she does have moderate degenerative findings about the tibiofemoral joint.  Overall I do believe that she would benefit from the meniscal root repair as she has previously had  injections of steroid as well as gel.  This is only transient in terms of giving her relief.  I did discuss that despite having a meniscal repair she may have some prolonged arthritic type pain although overall I would believe that this would improve her somewhat.  She will send [place] a MyChart message should she want to further discuss meniscal root Plan :    -She will send [place] a MyChart message should she want to pursue meniscal repair     I personally saw and evaluated the patient, and participated in the management and treatment plan.  Vanetta Mulders, MD Attending Physician, Orthopedic Surgery  This document was dictated using Dragon voice recognition software. A reasonable attempt at proof reading has been made to minimize errors.

## 2022-06-28 ENCOUNTER — Other Ambulatory Visit (HOSPITAL_BASED_OUTPATIENT_CLINIC_OR_DEPARTMENT_OTHER): Payer: Self-pay | Admitting: Orthopaedic Surgery

## 2022-06-28 ENCOUNTER — Encounter (HOSPITAL_BASED_OUTPATIENT_CLINIC_OR_DEPARTMENT_OTHER): Payer: Self-pay | Admitting: Orthopaedic Surgery

## 2022-06-28 DIAGNOSIS — M2392 Unspecified internal derangement of left knee: Secondary | ICD-10-CM

## 2022-07-04 DIAGNOSIS — F3341 Major depressive disorder, recurrent, in partial remission: Secondary | ICD-10-CM | POA: Diagnosis not present

## 2022-07-05 ENCOUNTER — Ambulatory Visit (INDEPENDENT_AMBULATORY_CARE_PROVIDER_SITE_OTHER): Payer: Medicare PPO | Admitting: Internal Medicine

## 2022-07-05 ENCOUNTER — Encounter: Payer: Self-pay | Admitting: Internal Medicine

## 2022-07-05 VITALS — BP 122/64 | HR 78 | Temp 98.1°F | Ht 64.5 in | Wt 183.8 lb

## 2022-07-05 DIAGNOSIS — Z0000 Encounter for general adult medical examination without abnormal findings: Secondary | ICD-10-CM

## 2022-07-05 DIAGNOSIS — E7800 Pure hypercholesterolemia, unspecified: Secondary | ICD-10-CM | POA: Diagnosis not present

## 2022-07-05 DIAGNOSIS — E559 Vitamin D deficiency, unspecified: Secondary | ICD-10-CM | POA: Diagnosis not present

## 2022-07-05 DIAGNOSIS — M810 Age-related osteoporosis without current pathological fracture: Secondary | ICD-10-CM

## 2022-07-05 DIAGNOSIS — K219 Gastro-esophageal reflux disease without esophagitis: Secondary | ICD-10-CM | POA: Diagnosis not present

## 2022-07-05 DIAGNOSIS — Z79899 Other long term (current) drug therapy: Secondary | ICD-10-CM | POA: Diagnosis not present

## 2022-07-05 LAB — CBC WITH DIFFERENTIAL/PLATELET
Basophils Absolute: 0.1 10*3/uL (ref 0.0–0.1)
Basophils Relative: 2.2 % (ref 0.0–3.0)
Eosinophils Absolute: 0.2 10*3/uL (ref 0.0–0.7)
Eosinophils Relative: 3.7 % (ref 0.0–5.0)
HCT: 40.7 % (ref 36.0–46.0)
Hemoglobin: 13.5 g/dL (ref 12.0–15.0)
Lymphocytes Relative: 28.7 % (ref 12.0–46.0)
Lymphs Abs: 1.4 10*3/uL (ref 0.7–4.0)
MCHC: 33.1 g/dL (ref 30.0–36.0)
MCV: 90.1 fl (ref 78.0–100.0)
Monocytes Absolute: 0.7 10*3/uL (ref 0.1–1.0)
Monocytes Relative: 14.3 % — ABNORMAL HIGH (ref 3.0–12.0)
Neutro Abs: 2.5 10*3/uL (ref 1.4–7.7)
Neutrophils Relative %: 51.1 % (ref 43.0–77.0)
Platelets: 277 10*3/uL (ref 150.0–400.0)
RBC: 4.52 Mil/uL (ref 3.87–5.11)
RDW: 14.2 % (ref 11.5–15.5)
WBC: 4.8 10*3/uL (ref 4.0–10.5)

## 2022-07-05 LAB — BASIC METABOLIC PANEL WITH GFR
BUN: 16 mg/dL (ref 6–23)
CO2: 30 meq/L (ref 19–32)
Calcium: 9.2 mg/dL (ref 8.4–10.5)
Chloride: 101 meq/L (ref 96–112)
Creatinine, Ser: 0.85 mg/dL (ref 0.40–1.20)
GFR: 70.86 mL/min
Glucose, Bld: 94 mg/dL (ref 70–99)
Potassium: 4.1 meq/L (ref 3.5–5.1)
Sodium: 139 meq/L (ref 135–145)

## 2022-07-05 LAB — HEPATIC FUNCTION PANEL
ALT: 13 U/L (ref 0–35)
AST: 17 U/L (ref 0–37)
Albumin: 4.2 g/dL (ref 3.5–5.2)
Alkaline Phosphatase: 84 U/L (ref 39–117)
Bilirubin, Direct: 0 mg/dL (ref 0.0–0.3)
Total Bilirubin: 0.5 mg/dL (ref 0.2–1.2)
Total Protein: 7.5 g/dL (ref 6.0–8.3)

## 2022-07-05 LAB — LIPID PANEL
Cholesterol: 201 mg/dL — ABNORMAL HIGH (ref 0–200)
HDL: 75.4 mg/dL (ref >39.00)
LDL Cholesterol: 111 mg/dL — ABNORMAL HIGH (ref 0–99)
NonHDL: 125.57
Total CHOL/HDL Ratio: 3
Triglycerides: 74 mg/dL (ref 0.0–149.0)
VLDL: 14.8 mg/dL (ref 0.0–40.0)

## 2022-07-05 LAB — TSH: TSH: 1.56 u[IU]/mL (ref 0.35–5.50)

## 2022-07-05 NOTE — Progress Notes (Signed)
Chief Complaint  Patient presents with   Annual Exam    HPI: Patient  [name] [name]  67 y.o. comes in today for [place] visit   Meniscus tear.   To have knee surgery  left  Gerd :Danis    med changed from  omeprazole  to famotidine.  Cause of osteoporosis  was on fosamax and now  prolia x 2   taking calcium 600 vit d in am   Had  shingrix and rsv  4 days ago  tired this week no other obv side effect  Health Maintenance  Topic Date Due   COVID-19 Vaccine (6 - 2023-24 season) 07/21/2022 (Originally 05/04/2022)   INFLUENZA VACCINE  08/19/2022 (Originally 12/19/2021)   COLONOSCOPY (Pts 45-8yr Insurance coverage will need to be confirmed)  03/29/2023   Medicare Annual Wellness (AWV)  04/26/2023   DTaP/Tdap/Td (3 - Td or Tdap) 05/14/2023   MAMMOGRAM  06/13/2024   Pneumonia Vaccine 67 Years old  Completed   DEXA SCAN  Completed   Hepatitis C Screening  Completed   Zoster Vaccines- Shingrix  Completed   HPV VACCINES  Aged Out   Health Maintenance Review LIFESTYLE:  Exercise:   not enough    knee  limits  Tobacco/ETS: n Alcohol:  rare  Sugar beverages:  some tea.  Sleep: about 7-8  Drug use: no HH of  1   no pets    ROS:  fine tremor at timess  no falling  other  neg fam hx ? From caffiene  GEN/ HEENT: No fever, significant weight changes sweats headaches vision problems hearing changes, CV/ PULM; No chest pain shortness of breath cough, syncope,edema  change in exercise tolerance. VV no changes sx  GI /GU: No adominal pain, vomiting, change in bowel habits. No blood in the stool. No significant GU symptoms. Has reflux  SKIN/HEME: ,no acute skin rashes suspicious lesions or bleeding. No lymphadenopathy, nodules, masses.  NEURO/ PSYCH:  No neurologic signs such as weakness numbness. No depression anxiety. IMM/ Allergy: No unusual infections.  Allergy .   REST of 12 system review negative except as per HPI   Past Medical History:  Diagnosis Date    Arthritis    Bunion    left foot   Depression    stable on medication   Diverticulitis 2015   Diverticulitis of colon without hemorrhage 09/28/2013   Diverticulosis    Gallstones    GERD (gastroesophageal reflux disease)    Hammer toes of both feet    Hyperlipidemia    no meds needed   IBS (irritable bowel syndrome)    Meniscal injury    Tear   Onychomycosis of toenail    Osteopenia    Osteoporosis    PUD (peptic ulcer disease) 1980's   Renal cyst    4.5-6 cm stable single  seen by uro in past    Past Surgical History:  Procedure Laterality Date   CHOLECYSTECTOMY  1987   COLONOSCOPY     IRRIGATION AND DEBRIDEMENT SEBACEOUS CYST Right 3/ 2014   chest wall   UPPER GASTROINTESTINAL ENDOSCOPY     WISDOM TOOTH EXTRACTION  age 67& 560  lower teeth done first and upper wisdom teeth last    Family History  Problem Relation Age of Onset   Rheum arthritis Father    Pneumonia Father        deceased   Colon cancer Father 451  Heart attack Father  Breast cancer Sister 62   Colon polyps Sister    Breast cancer Paternal Grandmother 80   Osteoarthritis Mother    COPD Mother    Pneumonia Mother    Osteoporosis Mother    Cancer Maternal Grandmother        ? GI   Heart failure Maternal Grandfather    Heart failure Paternal Grandfather    Esophageal cancer Neg Hx    Rectal cancer Neg Hx    Stomach cancer Neg Hx     Social History   Socioeconomic History   Marital status: Divorced    Spouse name: Not on file   Number of children: 0   Years of education: Not on file   Highest education level: Not on file  Occupational History   Occupation: TEACHER    Employer: [employer]  Tobacco Use   Smoking status: Never   Smokeless tobacco: Never  Vaping Use   Vaping Use: Never used  Substance and Sexual Activity   Alcohol use: No   Drug use: No   Sexual activity: Not Currently    Birth control/protection: Post-menopausal  Other Topics Concern   Not on file   Social History Narrative   Teacher media specialist graduate degree Oncologist.   Non smoker    Hh of 1 ... 2 cats    Single   G0P0   Mom is now in assisted living and she has moved into her old residence   Social Determinants of Health   Financial Resource Strain: Low Risk  (04/25/2022)   Overall Financial Resource Strain (CARDIA)    Difficulty of Paying Living Expenses: Not hard at all  Food Insecurity: No Food Insecurity (04/25/2022)   Hunger Vital Sign    Worried About Running Out of Food in the Last Year: Never true    [place] in the Last Year: Never true  Transportation Needs: No Transportation Needs (04/25/2022)   PRAPARE - Hydrologist (Medical): No    Lack of Transportation (Non-Medical): No  Physical Activity: Insufficiently Active (04/25/2022)   Exercise Vital Sign    Days of Exercise per Week: 3 days    Minutes of Exercise per Session: 30 min  Stress: Stress Concern Present (04/25/2022)   [place]    Feeling of Stress : To some extent  Social Connections: Moderately Integrated (04/25/2022)   Social Connection and Isolation Panel [NHANES]    Frequency of Communication with Friends and Family: More than three times a week    Frequency of Social Gatherings with Friends and Family: More than three times a week    Attends Religious Services: More than 4 times per year    Active Member of [employer] or Organizations: Yes    Attends Music therapist: More than 4 times per year    Marital Status: Divorced    Outpatient Medications Prior to Visit  Medication Sig Dispense Refill   betamethasone valerate ointment (VALISONE) 0.1 % Apply a pea sized amount topically 1-2 x a week prn 30 g 0   Calcium Carb-Cholecalciferol (CALCIUM 600+D3 PO) Take 1 tablet by mouth daily.     Cholecalciferol (VITAMIN D PO) Take 2,000 mg by mouth every other day.      denosumab  (PROLIA) 60 MG/ML SOSY injection Inject 60 mg into the skin every 6 (six) months.     famotidine (PEPCID) 40 MG tablet Take 1 tablet (40  mg total) by mouth 2 (two) times daily. 60 tablet 1   FLUoxetine (PROZAC) 10 MG tablet Take 5 mg by mouth daily.     FLUoxetine (PROZAC) 40 MG capsule Take 40 mg by mouth daily.     fluticasone (FLONASE) 50 MCG/ACT nasal spray Place 1 spray into both nostrils daily. 16 g 0   Misc. Devices MISC Apply 1 Dose topically daily. Salicylic acid for toe nails     Multiple Vitamins-Minerals (CENTRUM ULTRA WOMENS PO) Take 1 tablet by mouth every other day.     Probiotic Product (PROBIOTIC PO) Take by mouth daily.     nystatin-triamcinolone ointment (MYCOLOG) Apply 1 application topically 2 (two) times daily. (Patient not taking: Reported on 07/05/2022) 30 g 1   omeprazole (PRILOSEC) 20 MG capsule Take 1 capsule (20 mg total) by mouth daily. Please schedule a yearly follow up for continued medication management (Patient not taking: Reported on 07/05/2022) 90 capsule 0   Facility-Administered Medications Prior to Visit  Medication Dose Route Frequency Provider Last Rate Last Admin   0.9 %  sodium chloride infusion  500 mL Intravenous Once Danis, Estill Cotta III, MD         EXAM:  BP 122/64 (BP Location: Right Arm, Patient Position: Sitting, Cuff Size: Large)   Pulse 78   Temp 98.1 F (36.7 C) (Oral)   Ht 5' 4.5" (1.638 m)   Wt 183 lb 12.8 oz (83.4 kg)   LMP 11/18/2004   SpO2 96%   BMI 31.06 kg/m   Body mass index is 31.06 kg/m. Wt Readings from Last 3 Encounters:  07/05/22 183 lb 12.8 oz (83.4 kg)  06/22/22 187 lb 6 oz (85 kg)  04/25/22 184 lb 9.6 oz (83.7 kg)    Physical Exam: Vital signs reviewed [id number] is a well-developed well-nourished alert cooperative    who appearsr stated age in no acute distress.  HEENT: normocephalic atraumatic , Eyes: PERRL EOM's full, conjunctiva clear, Nares: paten,t no deformity discharge or tenderness., Ears: no deformity  EAC's clear TMs with normal landmarks. Mouth: clear OP, no lesions, edema.  Moist mucous membranes. Dentition in adequate repair. NECK: supple without masses, thyromegaly or bruits. CHEST/PULM:  Clear to auscultation and percussion breath sounds equal no wheeze , rales or rhonchi. No chest wall deformities or tenderness. Breast: normal by inspection . No dimpling, discharge, masses, tenderness or discharge . CV: PMI is nondisplaced, S1 S2 no gallops, murmurs, rubs. Peripheral pulses are full without delay.No JVD .  ABDOMEN: Bowel sounds normal nontender  No guard or rebound, no hepato splenomegal no CVA tenderness.  Extremtities:  No clubbing cyanosis or edema, no acute joint swelling or redness no focal atrophy vv  noted  area left knee issue NEURO:  Oriented x3, cranial nerves 3-12 appear to be intact, no obvious focal weakness,gait within normal limits no abnormal reflexes or asymmetrical  very fine hand tremor  intention intermittent no cogwheeoling SKIN: No acute rashes normal turgor, color, no bruising or petechiae. PSYCH: Oriented, good eye contact, no obvious depression anxiety, cognition and judgment appear normal. LN: no cervical axillary inguinal adenopathy  Lab Results  Component Value Date   WBC 5.2 04/21/2021   HGB 13.8 04/21/2021   HCT 42.0 04/21/2021   PLT 325 04/21/2021   GLUCOSE 71 07/04/2021   CHOL 226 (H) 07/04/2021   TRIG 80.0 07/04/2021   HDL 86.80 07/04/2021   LDLDIRECT 132.7 03/24/2013   LDLCALC 123 (H) 07/04/2021   ALT 18 07/04/2021   AST  25 07/04/2021   NA 139 07/04/2021   K 4.3 07/04/2021   CL 101 07/04/2021   CREATININE 0.86 07/04/2021   BUN 16 07/04/2021   CO2 24 07/04/2021   TSH 1.23 07/04/2021   HGBA1C 5.9 06/17/2020    BP Readings from Last 3 Encounters:  07/05/22 122/64  06/22/22 122/74  04/25/22 120/62    Lab rplan reviewed with patient is fasting  including fluids ...  ASSESSMENT AND PLAN:  Discussed the following assessment and  plan:    ICD-10-CM   1. Visit for preventive health examination  Z00.00     2. Medication management  Z79.899 CBC with Differential/Platelet    Lipid panel    TSH    Hepatic function panel    Basic metabolic panel    3. Hypercholesteremia  E78.00 CBC with Differential/Platelet    Lipid panel    TSH    Hepatic function panel    Basic metabolic panel    4. Vitamin D deficiency  E55.9 Hepatic function panel    Basic metabolic panel    5. Age related osteoporosis, unspecified pathological fracture presence  M81.0 CBC with Differential/Platelet    Lipid panel    TSH    Hepatic function panel    Basic metabolic panel    6. Gastroesophageal reflux disease, unspecified whether esophagitis present  K21.9    Dr Loletha Carrow  changeing from ppi to h2  some sx    Check labs calcium  continue supp. On prolia    Tremor mild and intermittent  poss essent and caffiene   neg fam hx and no other sx Disc activity for muscle mass maintenance .  Monitoring lab  Fu yearly depending  Return in about 1 year (around 07/06/2023) for depending on results.  Patient Care Team: Lalia Loudon, Standley Brooking, MD as PCP - Saralyn Pilar, FNP as Nurse Practitioner (Nurse Practitioner) Rolan Bucco, MD as Consulting Physician (Urology) Loletha Carrow Kirke Corin, MD as Consulting Physician (Gastroenterology) Alyson Ingles Candee Furbish, MD as Consulting Physician (Urology) Patient Instructions  Good to see your today. Try reistance exercises  around knee to keep up your strength before surgery.  Lab today.  Yearly check up if ok   Mariann Laster K. Eowyn Tabone M.D.

## 2022-07-05 NOTE — Patient Instructions (Signed)
Good to see your today. Try reistance exercises  around knee to keep up your strength before surgery.  Lab today.  Yearly check up if ok

## 2022-07-09 ENCOUNTER — Encounter: Payer: Self-pay | Admitting: Internal Medicine

## 2022-07-09 NOTE — Progress Notes (Signed)
Lab results are stable or in range . Cholesterol some improved from  last check. No new advice  The 10-year ASCVD risk score (Arnett DK, et al., 2019) is: 5.7%   Values used to calculate the score:     Age: 67 years     Sex: Female     Is Non-Hispanic African American: No     Diabetic: No     Tobacco smoker: No     Systolic Blood Pressure: 123XX123 mmHg     Is BP treated: No     HDL Cholesterol: 75.4 mg/dL     Total Cholesterol: 201 mg/dL

## 2022-07-10 ENCOUNTER — Other Ambulatory Visit: Payer: Self-pay

## 2022-07-10 NOTE — Telephone Encounter (Signed)
Not concerned clinically   this is mild out of range and relative % with  absolute number of monocytes are  in normal range( more important)  . Can't say whether vaccines  related   .    Would not be concerned with this finding if you are doing well otherwise.

## 2022-07-11 ENCOUNTER — Encounter: Payer: Self-pay | Admitting: Internal Medicine

## 2022-07-12 DIAGNOSIS — F331 Major depressive disorder, recurrent, moderate: Secondary | ICD-10-CM | POA: Diagnosis not present

## 2022-07-12 DIAGNOSIS — F411 Generalized anxiety disorder: Secondary | ICD-10-CM | POA: Diagnosis not present

## 2022-07-12 NOTE — Telephone Encounter (Signed)
Form competed and signed

## 2022-07-15 ENCOUNTER — Other Ambulatory Visit: Payer: Self-pay

## 2022-07-15 ENCOUNTER — Ambulatory Visit
Admission: EM | Admit: 2022-07-15 | Discharge: 2022-07-15 | Disposition: A | Discharge status: Home / Self Care | Payer: Medicare PPO | Attending: Emergency Medicine | Admitting: Emergency Medicine

## 2022-07-15 DIAGNOSIS — J302 Other seasonal allergic rhinitis: Secondary | ICD-10-CM | POA: Diagnosis not present

## 2022-07-15 DIAGNOSIS — J0100 Acute maxillary sinusitis, unspecified: Secondary | ICD-10-CM | POA: Diagnosis not present

## 2022-07-15 MED ORDER — IPRATROPIUM BROMIDE 0.06 % NA SOLN: 2.0000 | Freq: Three times a day (TID) | NASAL | 1 refills | Status: DC

## 2022-07-15 MED ORDER — CETIRIZINE HCL 10 MG PO TABS: 10.0000 mg | ORAL_TABLET | Freq: Every day | ORAL | 1 refills | Status: DC

## 2022-07-15 MED ORDER — FLUTICASONE PROPIONATE 50 MCG/ACT NA SUSP: 1.0000 | Freq: Every day | NASAL | 1 refills | Status: DC

## 2022-07-15 MED ORDER — IBUPROFEN 400 MG PO TABS: 400.0000 mg | ORAL_TABLET | Freq: Three times a day (TID) | ORAL | 0 refills | Status: DC | PRN

## 2022-07-15 NOTE — Discharge Instructions (Signed)
Your symptoms and physical exam findings are concerning for a viral respiratory infection.  Because respiratory allergies are not well controlled at this time, this makes you more susceptible to catching respiratory infections.   To avoid catching frequent respiratory infections, having skin reactions, dealing with eye irritation, losing sleep, missing work, etc., due to uncontrolled allergies, it is important that you begin/continue your allergy regimen and are consistent with taking your meds exactly as prescribed.   Please read below to learn more about the medications, dosages and frequencies that I recommend to help alleviate your symptoms and to get you feeling better soon: Zyrtec (cetirizine): This is an excellent second-generation antihistamine that helps to reduce respiratory inflammatory response to environmental allergens.  In some patients, this medication can cause daytime sleepiness so I recommend that you take 1 tablet daily at bedtime.     Flonase (fluticasone): This is a steroid nasal spray that used once daily, 1 spray in each nare.  This works best when used on a daily basis. This medication does not work well if it is only used when you think you need it.  After 3 to 5 days of use, you will notice significant reduction of the inflammation and mucus production that is currently being caused by exposure to allergens, whether seasonal or environmental.  The most common side effect of this medication is nosebleeds.  If you experience a nosebleed, please discontinue use for 1 week, then feel free to resume.  If you find that your insurance will not pay for this medication, please consider a different nasal steroids such as Nasonex (mometasone), or Nasacort (triamcinolone).   Atrovent (ipratropium): This is an excellent nasal decongestant spray that will not cause rebound congestion.  Please instill 2 sprays into each nare after using the nasal steroid and repeat 2 more times throughout the  day.  I have added this nasal spray to the nasal steroid because nasal steroids can take several days to reach full effect.  Once you find that you are forgetting to use the spray more often than you remember to use it, you will know that you no longer need it.     Advil, Motrin (ibuprofen): This is a good anti-inflammatory medication which not only addresses aches, pains but also significantly reduces soft tissue inflammation of the upper airways that causes sinus and nasal congestion as well as inflammation of the lower airways which makes you feel like your breathing is constricted or your cough feel tight.  I recommend that you take 400 mg every 8 hours as needed.      If symptoms have not meaningfully improved in the next 5 to 7 days, please return for repeat evaluation or follow-up with your regular provider.  If symptoms have worsened in the next 3 to 5 days, please return for repeat evaluation or follow-up with your regular provider.    Thank you for visiting urgent care today.  We appreciate the opportunity to participate in your care.

## 2022-07-15 NOTE — ED Triage Notes (Signed)
Pt presents to Urgent Care with c/o nasal congestion and facial pain x 5 days.

## 2022-07-15 NOTE — ED Provider Notes (Signed)
[name] [name] CARE    CSN: [id number] Arrival date & time: 07/15/22  1340    HISTORY   Chief Complaint  Patient presents with   Nasal Congestion   Facial Pain   HPI [name] [name] is a pleasant, 67 y.o. female who presents to urgent care today. Patient complains of nasal congestion, sinus pain/pressure and clear rhinorrhea for the past 5 days.  Patient endorses a history of seasonal allergies, not currently taking any allergy medication.  Patient states she has been taking an OTC allergy medication, does not recall the name of it, states she does not feel that is helping very much.  Patient denies fever, body aches, chills, nausea, vomiting, diarrhea, headache, known sick contacts.  The history is provided by the patient.   Past Medical History:  Diagnosis Date   Arthritis    Bunion    left foot   Depression    stable on medication   Diverticulitis 2015   Diverticulitis of colon without hemorrhage 09/28/2013   Diverticulosis    Gallstones    GERD (gastroesophageal reflux disease)    Hammer toes of both feet    Hyperlipidemia    no meds needed   IBS (irritable bowel syndrome)    Meniscal injury    Tear   Onychomycosis of toenail    Osteopenia    Osteoporosis    PUD (peptic ulcer disease) 1980's   Renal cyst    4.5-6 cm stable single  seen by uro in past   Patient Active Problem List   Diagnosis Date Noted   Elevated alkaline phosphatase measurement 09/26/2014   Renal cyst, left 12/31/2013   Osteopenia 12/31/2013   Vitamin D deficiency 12/31/2013   Postmenopausal atrophic vaginitis 12/31/2013   Family history of colon cancer 10/02/2013   Wrist pain, right 05/13/2013   Hx of fall 05/13/2013   Hypercholesteremia 05/13/2013   History of kidney stones    Musculoskeletal leg pain 02/02/2013   Gait abnormality 02/02/2013   Chest discomfort 05/28/2011   Left wrist injury 12/01/2010   Renal cyst 12/01/2010   Cough 09/27/2010   Neck pain 08/29/2010    Tingling sensation 08/29/2010   Allergic rhinitis, seasonal 08/29/2010   LEG PAIN, RIGHT 07/01/2009   VARICOSE VEINS LOWER EXTREMITIES W/OTH COMPS 12/06/2008   BACK PAIN, THORACIC REGION 12/06/2008   BACK PAIN 11/10/2008   BRACHIAL NEURITIS OR RADICULITIS NOS 10/29/2008   Irritable bowel syndrome 04/23/2007   HYPERLIPIDEMIA NEC/NOS 01/10/2007   DEPRESSION 01/08/2007   GERD 01/08/2007   Diverticulosis of colon (without mention of hemorrhage) 07/28/2002   Past Surgical History:  Procedure Laterality Date   CHOLECYSTECTOMY  1987   COLONOSCOPY     IRRIGATION AND DEBRIDEMENT SEBACEOUS CYST Right 3/ 2014   chest wall   UPPER GASTROINTESTINAL ENDOSCOPY     WISDOM TOOTH EXTRACTION  age 12 & 51   lower teeth done first and upper wisdom teeth last   OB History     Gravida  0   Para  0   Term  0   Preterm  0   AB  0   Living  0      SAB  0   IAB  0   Ectopic  0   Multiple  0   Live Births  0          Home Medications    Prior to Admission medications   Medication Sig Start Date End Date Taking? Authorizing Provider  betamethasone valerate ointment (  VALISONE) 0.1 % Apply a pea sized amount topically 1-2 x a week prn 11/22/21   Salvadore Dom, MD  Calcium Carb-Cholecalciferol (CALCIUM 600+D3 PO) Take 1 tablet by mouth daily.    [provider]  Cholecalciferol (VITAMIN D PO) Take 2,000 mg by mouth every other day.     [provider]  denosumab (PROLIA) 60 MG/ML SOSY injection Inject 60 mg into the skin every 6 (six) months.    [provider]  famotidine (PEPCID) 40 MG tablet Take 1 tablet (40 mg total) by mouth 2 (two) times daily. 06/22/22   Doran Stabler, MD  FLUoxetine (PROZAC) 10 MG tablet Take 5 mg by mouth daily. 04/27/21   [provider]  FLUoxetine (PROZAC) 40 MG capsule Take 40 mg by mouth daily. 07/23/19   [provider]  fluticasone (FLONASE) 50 MCG/ACT nasal spray Place 1 spray into both nostrils  daily. 10/23/21   Billie Ruddy, MD  Misc. Devices MISC Apply 1 Dose topically daily. Salicylic acid for toe nails 05/17/22   [provider]  Multiple Vitamins-Minerals (CENTRUM ULTRA WOMENS PO) Take 1 tablet by mouth every other day.    [provider]  Probiotic Product (PROBIOTIC PO) Take by mouth daily.    [provider]    Family History Family History  Problem Relation Age of Onset   Osteoarthritis Mother    COPD Mother    Pneumonia Mother    Osteoporosis Mother    Rheum arthritis Father    Pneumonia Father        deceased   Colon cancer Father 76   Heart attack Father    Breast cancer Sister 33   Colon polyps Sister    Cancer Maternal Grandmother        ? GI   Heart failure Maternal Grandfather    Breast cancer Paternal Grandmother 96   Heart failure Paternal Grandfather    Esophageal cancer Neg Hx    Rectal cancer Neg Hx    Stomach cancer Neg Hx    Social History Social History   Tobacco Use   Smoking status: Never   Smokeless tobacco: Never  Vaping Use   Vaping Use: Never used  Substance Use Topics   Alcohol use: No   Drug use: No   Allergies   Fosamax [alendronate sodium] and Tylenol [acetaminophen]  Review of Systems Review of Systems Pertinent findings revealed after performing a 14 point review of systems has been noted in the history of present illness.  Physical Exam Vital Signs BP (!) 141/81 (BP Location: Left Arm)   Pulse 80   Temp 98.5 F (36.9 C) (Oral)   Resp 20   Ht '5\' 5"'$  (1.651 m)   Wt 180 lb (81.6 kg)   LMP 11/18/2004   SpO2 96%   BMI 29.95 kg/m   No data found.  Physical Exam Vitals and nursing note reviewed.  Constitutional:      General: She is not in acute distress.    Appearance: Normal appearance. She is not ill-appearing.  HENT:     Head: Normocephalic and atraumatic.     Salivary Glands: Right salivary gland is not diffusely enlarged or tender. Left salivary gland is not diffusely  enlarged or tender.     Right Ear: Ear canal and external ear normal. No drainage. A middle ear effusion is present. There is no impacted cerumen. Tympanic membrane is bulging. Tympanic membrane is not injected or erythematous.  Left Ear: Ear canal and external ear normal. No drainage. A middle ear effusion is present. There is no impacted cerumen. Tympanic membrane is bulging. Tympanic membrane is not injected or erythematous.     Ears:     Comments: Bilateral EACs normal, both TMs bulging with clear fluid    Nose: Rhinorrhea present. No nasal deformity, septal deviation, signs of injury, nasal tenderness, mucosal edema or congestion. Rhinorrhea is clear.     Right Nostril: Occlusion present. No foreign body, epistaxis or septal hematoma.     Left Nostril: Occlusion present. No foreign body, epistaxis or septal hematoma.     Right Turbinates: Enlarged, swollen and pale.     Left Turbinates: Enlarged, swollen and pale.     Right Sinus: No maxillary sinus tenderness or frontal sinus tenderness.     Left Sinus: No maxillary sinus tenderness or frontal sinus tenderness.     Mouth/Throat:     Lips: Pink. No lesions.     Mouth: Mucous membranes are moist. No oral lesions.     Pharynx: Oropharynx is clear. Uvula midline. No posterior oropharyngeal erythema or uvula swelling.     Tonsils: No tonsillar exudate. 0 on the right. 0 on the left.     Comments: Postnasal drip Eyes:     General: Lids are normal.        Right eye: No discharge.        Left eye: No discharge.     Extraocular Movements: Extraocular movements intact.     Conjunctiva/sclera: Conjunctivae normal.     Right eye: Right conjunctiva is not injected.     Left eye: Left conjunctiva is not injected.  Neck:     Trachea: Trachea and phonation normal.  Cardiovascular:     Rate and Rhythm: Normal rate and regular rhythm.     Pulses: Normal pulses.     Heart sounds: Normal heart sounds. No murmur heard.    No friction rub. No  gallop.  Pulmonary:     Effort: Pulmonary effort is normal. No accessory muscle usage, prolonged expiration or respiratory distress.     Breath sounds: Normal breath sounds. No stridor, decreased air movement or transmitted upper airway sounds. No decreased breath sounds, wheezing, rhonchi or rales.  Chest:     Chest wall: No tenderness.  Musculoskeletal:        General: Normal range of motion.     Cervical back: Normal range of motion and neck supple. Normal range of motion.  Lymphadenopathy:     Cervical: No cervical adenopathy.  Skin:    General: Skin is warm and dry.     Findings: No erythema or rash.  Neurological:     General: No focal deficit present.     Mental Status: She is alert and oriented to person, place, and time.  Psychiatric:        Mood and Affect: Mood normal.        Behavior: Behavior normal.     Visual Acuity Right Eye Distance:   Left Eye Distance:   Bilateral Distance:    Right Eye Near:   Left Eye Near:    Bilateral Near:     UC Couse / Diagnostics / Procedures:     Radiology No results found.  Procedures Procedures (including critical care time) EKG  Pending results:  Labs Reviewed - No data to display  Medications Ordered in UC: Medications - No data to display  UC Diagnoses / Final Clinical Impressions(s)   I  have reviewed the triage vital signs and the nursing notes.  Pertinent labs & imaging results that were available during my care of the patient were reviewed by me and considered in my medical decision making (see chart for details).    Final diagnoses:  Acute non-recurrent maxillary sinusitis  Seasonal allergic rhinitis, unspecified trigger   Patient advised physical exam findings are concerning for respiratory allergies.  Patient provided with allergy medication and ipratropium nasal spray to help alleviate symptoms faster while allergy medications reach efficacy.  Patient also advised to take ibuprofen to help alleviate  sinus pressure and pain and promote drainage.  Patient advised to return in 3 to 5 days if not feeling any better and certainly sooner if feeling worse.  Please see discharge instructions below for further details of plan of care as provided to patient. ED Prescriptions     Medication Sig Dispense Auth. Provider   cetirizine (ZYRTEC ALLERGY) 10 MG tablet Take 1 tablet (10 mg total) by mouth at bedtime. 90 tablet Lynden Oxford Scales, PA-C   fluticasone (FLONASE) 50 MCG/ACT nasal spray Place 1 spray into both nostrils daily. 47.4 mL Lynden Oxford Scales, PA-C   ipratropium (ATROVENT) 0.06 % nasal spray Place 2 sprays into both nostrils 3 (three) times daily. As needed for nasal congestion, runny nose 15 mL Lynden Oxford Scales, PA-C   ibuprofen (ADVIL) 400 MG tablet Take 1 tablet (400 mg total) by mouth every 8 (eight) hours as needed for up to 30 doses. 30 tablet Lynden Oxford Scales, PA-C      PDMP not reviewed this encounter.  Disposition Upon Discharge:  Condition: stable for discharge home Home: take medications as prescribed; routine discharge instructions as discussed; follow up as advised.  Patient presented with an acute illness with associated systemic symptoms and significant discomfort requiring urgent management. In my opinion, this is a condition that a prudent lay person (someone who possesses an average knowledge of health and medicine) may potentially expect to result in complications if not addressed urgently such as respiratory distress, impairment of bodily function or dysfunction of bodily organs.   Routine symptom specific, illness specific and/or disease specific instructions were discussed with the patient and/or caregiver at length.   As such, the patient has been evaluated and assessed, work-up was performed and treatment was provided in alignment with urgent care protocols and evidence based medicine.  Patient/parent/caregiver has been advised that the patient  may require follow up for further testing and treatment if the symptoms continue in spite of treatment, as clinically indicated and appropriate.  If the patient was tested for COVID-19, Influenza and/or RSV, then the patient/parent/guardian was advised to isolate at home pending the results of his/her diagnostic coronavirus test and potentially longer if they're positive. I have also advised pt that if his/her COVID-19 test returns positive, it's recommended to self-isolate for at least 10 days after symptoms first appeared AND until fever-free for 24 hours without fever reducer AND other symptoms have improved or resolved. Discussed self-isolation recommendations as well as instructions for household member/close contacts as per the [facility] and [place] [facility], and also gave patient the [place] packet with this information.  Patient/parent/caregiver has been advised to return to the [facility] or PCP in 3-5 days if no better; to PCP or the Emergency Department if new signs and symptoms develop, or if the current signs or symptoms continue to change or worsen for further workup, evaluation and treatment as clinically indicated and appropriate  The patient will follow  up with their current PCP if and as advised. If the patient does not currently have a PCP we will assist them in obtaining one.   The patient may need specialty follow up if the symptoms continue, in spite of conservative treatment and management, for further workup, evaluation, consultation and treatment as clinically indicated and appropriate.  Patient/parent/caregiver verbalized understanding and agreement of plan as discussed.  All questions were addressed during visit.  Please see discharge instructions below for further details of plan.  Discharge Instructions:   Discharge Instructions      Your symptoms and physical exam findings are concerning for a viral respiratory infection.  Because respiratory allergies are not well controlled at this time,  this makes you more susceptible to catching respiratory infections.   To avoid catching frequent respiratory infections, having skin reactions, dealing with eye irritation, losing sleep, missing work, etc., due to uncontrolled allergies, it is important that you begin/continue your allergy regimen and are consistent with taking your meds exactly as prescribed.   Please read below to learn more about the medications, dosages and frequencies that I recommend to help alleviate your symptoms and to get you feeling better soon: Zyrtec (cetirizine): This is an excellent second-generation antihistamine that helps to reduce respiratory inflammatory response to environmental allergens.  In some patients, this medication can cause daytime sleepiness so I recommend that you take 1 tablet daily at bedtime.     Flonase (fluticasone): This is a steroid nasal spray that used once daily, 1 spray in each nare.  This works best when used on a daily basis. This medication does not work well if it is only used when you think you need it.  After 3 to 5 days of use, you will notice significant reduction of the inflammation and mucus production that is currently being caused by exposure to allergens, whether seasonal or environmental.  The most common side effect of this medication is nosebleeds.  If you experience a nosebleed, please discontinue use for 1 week, then feel free to resume.  If you find that your insurance will not pay for this medication, please consider a different nasal steroids such as Nasonex (mometasone), or Nasacort (triamcinolone).   Atrovent (ipratropium): This is an excellent nasal decongestant spray that will not cause rebound congestion.  Please instill 2 sprays into each nare after using the nasal steroid and repeat 2 more times throughout the day.  I have added this nasal spray to the nasal steroid because nasal steroids can take several days to reach full effect.  Once you find that you are forgetting  to use the spray more often than you remember to use it, you will know that you no longer need it.     Advil, Motrin (ibuprofen): This is a good anti-inflammatory medication which not only addresses aches, pains but also significantly reduces soft tissue inflammation of the upper airways that causes sinus and nasal congestion as well as inflammation of the lower airways which makes you feel like your breathing is constricted or your cough feel tight.  I recommend that you take 400 mg every 8 hours as needed.      If symptoms have not meaningfully improved in the next 5 to 7 days, please return for repeat evaluation or follow-up with your regular provider.  If symptoms have worsened in the next 3 to 5 days, please return for repeat evaluation or follow-up with your regular provider.    Thank you for visiting urgent care today.  We appreciate the opportunity to participate in your care.       This office note has been dictated using Museum/gallery curator.  Unfortunately, this method of dictation can sometimes lead to typographical or grammatical errors.  I apologize for your inconvenience in advance if this occurs.  Please do not hesitate to reach out to me if clarification is needed.      Lynden Oxford Scales, [place] 07/16/22 [phone]

## 2022-07-16 ENCOUNTER — Telehealth: Payer: Self-pay | Admitting: Emergency Medicine

## 2022-07-16 NOTE — Telephone Encounter (Signed)
Spoke with patient, states that she is doing well.  Patient has started her medication and will follow up prn.

## 2022-07-23 ENCOUNTER — Encounter (HOSPITAL_BASED_OUTPATIENT_CLINIC_OR_DEPARTMENT_OTHER): Payer: Self-pay | Admitting: Orthopaedic Surgery

## 2022-07-23 ENCOUNTER — Other Ambulatory Visit: Payer: Self-pay

## 2022-07-27 ENCOUNTER — Ambulatory Visit (HOSPITAL_BASED_OUTPATIENT_CLINIC_OR_DEPARTMENT_OTHER): Payer: Self-pay | Admitting: Orthopaedic Surgery

## 2022-07-27 DIAGNOSIS — M23322 Other meniscus derangements, posterior horn of medial meniscus, left knee: Secondary | ICD-10-CM

## 2022-07-27 DIAGNOSIS — M2392 Unspecified internal derangement of left knee: Secondary | ICD-10-CM

## 2022-07-30 ENCOUNTER — Encounter (HOSPITAL_BASED_OUTPATIENT_CLINIC_OR_DEPARTMENT_OTHER)
Admission: RE | Disposition: A | Discharge status: Home / Self Care | Payer: Self-pay | Source: Ambulatory Visit | Attending: Orthopaedic Surgery

## 2022-07-30 ENCOUNTER — Ambulatory Visit (HOSPITAL_BASED_OUTPATIENT_CLINIC_OR_DEPARTMENT_OTHER)
Admission: RE | Admit: 2022-07-30 | Discharge: 2022-07-30 | Disposition: A | Discharge status: Home / Self Care | Payer: Medicare PPO | Source: Ambulatory Visit | Attending: Orthopaedic Surgery | Admitting: Orthopaedic Surgery

## 2022-07-30 ENCOUNTER — Ambulatory Visit (HOSPITAL_BASED_OUTPATIENT_CLINIC_OR_DEPARTMENT_OTHER): Payer: Self-pay | Admitting: Orthopaedic Surgery

## 2022-07-30 ENCOUNTER — Ambulatory Visit (HOSPITAL_BASED_OUTPATIENT_CLINIC_OR_DEPARTMENT_OTHER): Payer: Medicare PPO | Admitting: Certified Registered"

## 2022-07-30 ENCOUNTER — Other Ambulatory Visit: Payer: Self-pay

## 2022-07-30 ENCOUNTER — Encounter (HOSPITAL_BASED_OUTPATIENT_CLINIC_OR_DEPARTMENT_OTHER): Payer: Self-pay | Admitting: Orthopaedic Surgery

## 2022-07-30 DIAGNOSIS — S83242A Other tear of medial meniscus, current injury, left knee, initial encounter: Secondary | ICD-10-CM | POA: Diagnosis not present

## 2022-07-30 DIAGNOSIS — M2392 Unspecified internal derangement of left knee: Secondary | ICD-10-CM

## 2022-07-30 DIAGNOSIS — S83242D Other tear of medial meniscus, current injury, left knee, subsequent encounter: Secondary | ICD-10-CM | POA: Diagnosis not present

## 2022-07-30 DIAGNOSIS — X58XXXA Exposure to other specified factors, initial encounter: Secondary | ICD-10-CM | POA: Diagnosis not present

## 2022-07-30 DIAGNOSIS — Z01818 Encounter for other preprocedural examination: Secondary | ICD-10-CM

## 2022-07-30 HISTORY — PX: KNEE ARTHROSCOPY WITH MENISCAL REPAIR: SHX5653

## 2022-07-30 SURGERY — ARTHROSCOPY, KNEE, WITH MENISCUS REPAIR
Anesthesia: General | Site: Knee | Laterality: Left

## 2022-07-30 MED ORDER — HYDROMORPHONE HCL 1 MG/ML IJ SOLN
0.2500 mg | INTRAMUSCULAR | Status: DC | PRN
  Administered 2022-07-30 (×3): 0.5 mg via INTRAVENOUS

## 2022-07-30 MED ORDER — OXYCODONE HCL 5 MG PO TABS
5.0000 mg | ORAL_TABLET | Freq: Once | ORAL | Status: AC | PRN
  Administered 2022-07-30: 5 mg via ORAL

## 2022-07-30 MED ORDER — DEXAMETHASONE SODIUM PHOSPHATE 10 MG/ML IJ SOLN
INTRAMUSCULAR | Status: AC
  Filled 2022-07-30: qty 1

## 2022-07-30 MED ORDER — MIDAZOLAM HCL 5 MG/5ML IJ SOLN
INTRAMUSCULAR | Status: DC | PRN
  Administered 2022-07-30: 2 mg via INTRAVENOUS

## 2022-07-30 MED ORDER — AMISULPRIDE (ANTIEMETIC) 5 MG/2ML IV SOLN: 10.0000 mg | Freq: Once | INTRAVENOUS | Status: DC | PRN

## 2022-07-30 MED ORDER — PROPOFOL 10 MG/ML IV BOLUS
INTRAVENOUS | Status: DC | PRN
  Administered 2022-07-30: 140 mg via INTRAVENOUS
  Administered 2022-07-30: 30 mg via INTRAVENOUS

## 2022-07-30 MED ORDER — ONDANSETRON HCL 4 MG/2ML IJ SOLN
INTRAMUSCULAR | Status: AC
  Filled 2022-07-30: qty 2

## 2022-07-30 MED ORDER — CEFAZOLIN SODIUM-DEXTROSE 2-4 GM/100ML-% IV SOLN
INTRAVENOUS | Status: AC
  Filled 2022-07-30: qty 100

## 2022-07-30 MED ORDER — GABAPENTIN 300 MG PO CAPS
300.0000 mg | ORAL_CAPSULE | Freq: Once | ORAL | Status: AC
  Administered 2022-07-30: 300 mg via ORAL

## 2022-07-30 MED ORDER — HYDROMORPHONE HCL 1 MG/ML IJ SOLN
INTRAMUSCULAR | Status: AC
  Filled 2022-07-30: qty 0.5

## 2022-07-30 MED ORDER — ACETAMINOPHEN 500 MG PO TABS: 1000.0000 mg | ORAL_TABLET | Freq: Once | ORAL | Status: DC

## 2022-07-30 MED ORDER — EPHEDRINE SULFATE (PRESSORS) 50 MG/ML IJ SOLN
INTRAMUSCULAR | Status: DC | PRN
  Administered 2022-07-30 (×2): 5 mg via INTRAVENOUS

## 2022-07-30 MED ORDER — LACTATED RINGERS IV SOLN: INTRAVENOUS | Status: DC

## 2022-07-30 MED ORDER — FENTANYL CITRATE (PF) 100 MCG/2ML IJ SOLN
INTRAMUSCULAR | Status: DC | PRN
  Administered 2022-07-30 (×4): 25 ug via INTRAVENOUS

## 2022-07-30 MED ORDER — TRANEXAMIC ACID-NACL 1000-0.7 MG/100ML-% IV SOLN
INTRAVENOUS | Status: AC
  Filled 2022-07-30: qty 100

## 2022-07-30 MED ORDER — ACETAMINOPHEN 500 MG PO TABS
ORAL_TABLET | ORAL | Status: AC
  Filled 2022-07-30: qty 2

## 2022-07-30 MED ORDER — ONDANSETRON HCL 4 MG/2ML IJ SOLN: 4.0000 mg | Freq: Once | INTRAMUSCULAR | Status: DC | PRN

## 2022-07-30 MED ORDER — PROPOFOL 10 MG/ML IV BOLUS
INTRAVENOUS | Status: AC
  Filled 2022-07-30: qty 20

## 2022-07-30 MED ORDER — TRANEXAMIC ACID-NACL 1000-0.7 MG/100ML-% IV SOLN
1000.0000 mg | INTRAVENOUS | Status: AC
  Administered 2022-07-30: 1000 mg via INTRAVENOUS

## 2022-07-30 MED ORDER — ONDANSETRON HCL 4 MG/2ML IJ SOLN
INTRAMUSCULAR | Status: DC | PRN
  Administered 2022-07-30: 4 mg via INTRAVENOUS

## 2022-07-30 MED ORDER — OXYCODONE HCL 5 MG PO TABS
ORAL_TABLET | ORAL | Status: AC
  Filled 2022-07-30: qty 1

## 2022-07-30 MED ORDER — DEXAMETHASONE SODIUM PHOSPHATE 10 MG/ML IJ SOLN
INTRAMUSCULAR | Status: DC | PRN
  Administered 2022-07-30: 8 mg via INTRAVENOUS

## 2022-07-30 MED ORDER — MIDAZOLAM HCL 2 MG/2ML IJ SOLN
INTRAMUSCULAR | Status: AC
  Filled 2022-07-30: qty 2

## 2022-07-30 MED ORDER — LIDOCAINE 2% (20 MG/ML) 5 ML SYRINGE
INTRAMUSCULAR | Status: AC
  Filled 2022-07-30: qty 5

## 2022-07-30 MED ORDER — SODIUM CHLORIDE 0.9 % IR SOLN
Status: DC | PRN
  Administered 2022-07-30 (×2): 3001 mL

## 2022-07-30 MED ORDER — BUPIVACAINE HCL (PF) 0.25 % IJ SOLN
INTRAMUSCULAR | Status: DC | PRN
  Administered 2022-07-30: 10 mL

## 2022-07-30 MED ORDER — CEFAZOLIN SODIUM-DEXTROSE 2-4 GM/100ML-% IV SOLN
2.0000 g | INTRAVENOUS | Status: AC
  Administered 2022-07-30: 2 g via INTRAVENOUS

## 2022-07-30 MED ORDER — IBUPROFEN 800 MG PO TABS: 800.0000 mg | ORAL_TABLET | Freq: Three times a day (TID) | ORAL | 0 refills | Status: AC

## 2022-07-30 MED ORDER — GABAPENTIN 300 MG PO CAPS
ORAL_CAPSULE | ORAL | Status: AC
  Filled 2022-07-30: qty 1

## 2022-07-30 MED ORDER — ACETAMINOPHEN 500 MG PO TABS: 500.0000 mg | ORAL_TABLET | Freq: Three times a day (TID) | ORAL | 0 refills | Status: AC

## 2022-07-30 MED ORDER — SODIUM CHLORIDE 0.9 % IR SOLN
Status: DC | PRN
  Administered 2022-07-30: 3000 mL

## 2022-07-30 MED ORDER — OXYCODONE HCL 5 MG/5ML PO SOLN: 5.0000 mg | Freq: Once | ORAL | Status: AC | PRN

## 2022-07-30 MED ORDER — ASPIRIN 325 MG PO TBEC: 325.0000 mg | DELAYED_RELEASE_TABLET | Freq: Every day | ORAL | 0 refills | Status: DC

## 2022-07-30 MED ORDER — FENTANYL CITRATE (PF) 100 MCG/2ML IJ SOLN
INTRAMUSCULAR | Status: AC
  Filled 2022-07-30: qty 2

## 2022-07-30 MED ORDER — OXYCODONE HCL 5 MG PO TABS: 5.0000 mg | ORAL_TABLET | ORAL | 0 refills | Status: DC | PRN

## 2022-07-30 MED ORDER — EPHEDRINE 5 MG/ML INJ
INTRAVENOUS | Status: AC
  Filled 2022-07-30: qty 5

## 2022-07-30 MED ORDER — LIDOCAINE HCL (CARDIAC) PF 100 MG/5ML IV SOSY
PREFILLED_SYRINGE | INTRAVENOUS | Status: DC | PRN
  Administered 2022-07-30: 60 mg via INTRAVENOUS

## 2022-07-30 SURGICAL SUPPLY — 61 items
APL PRP STRL LF DISP 70% ISPRP (MISCELLANEOUS) ×1
BANDAGE ESMARK 6X9 LF (GAUZE/BANDAGES/DRESSINGS) IMPLANT
BLADE EXCALIBUR 4.0X13 (MISCELLANEOUS) IMPLANT
BLADE SURG 15 STRL LF DISP TIS (BLADE) IMPLANT
BLADE SURG 15 STRL SS (BLADE)
BNDG CMPR 5X62 HK CLSR LF (GAUZE/BANDAGES/DRESSINGS) ×1
BNDG CMPR 6"X 5 YARDS HK CLSR (GAUZE/BANDAGES/DRESSINGS) ×1
BNDG CMPR 9X6 STRL LF SNTH (GAUZE/BANDAGES/DRESSINGS)
BNDG ELASTIC 4X5.8 VLCR STR LF (GAUZE/BANDAGES/DRESSINGS) ×2 IMPLANT
BNDG ELASTIC 6INX 5YD STR LF (GAUZE/BANDAGES/DRESSINGS) ×2 IMPLANT
BNDG ESMARK 6X9 LF (GAUZE/BANDAGES/DRESSINGS)
CHLORAPREP W/TINT 26 (MISCELLANEOUS) ×2 IMPLANT
COOLER ICEMAN CLASSIC (MISCELLANEOUS) ×2 IMPLANT
CUFF TOURN SGL QUICK 34 (TOURNIQUET CUFF)
CUFF TRNQT CYL 34X4.125X (TOURNIQUET CUFF) IMPLANT
DISSECTOR  3.8MM X 13CM (MISCELLANEOUS) ×1
DISSECTOR 3.8MM X 13CM (MISCELLANEOUS) ×2 IMPLANT
DRAPE ARTHROSCOPY W/POUCH 90 (DRAPES) ×2 IMPLANT
DRAPE IMP U-DRAPE 54X76 (DRAPES) ×2 IMPLANT
DRAPE INCISE IOBAN 66X45 STRL (DRAPES) IMPLANT
DRAPE U-SHAPE 47X51 STRL (DRAPES) ×2 IMPLANT
DW OUTFLOW CASSETTE/TUBE SET (MISCELLANEOUS) ×2 IMPLANT
ELECT REM PT RETURN 9FT ADLT (ELECTROSURGICAL)
ELECTRODE REM PT RTRN 9FT ADLT (ELECTROSURGICAL) IMPLANT
EXCALIBUR 3.8MM X 13CM (MISCELLANEOUS) IMPLANT
GAUZE 4X4 16PLY ~~LOC~~+RFID DBL (SPONGE) IMPLANT
GAUZE PAD ABD 8X10 STRL (GAUZE/BANDAGES/DRESSINGS) ×2 IMPLANT
GAUZE SPONGE 4X4 12PLY STRL (GAUZE/BANDAGES/DRESSINGS) ×2 IMPLANT
GAUZE XEROFORM 1X8 LF (GAUZE/BANDAGES/DRESSINGS) ×2 IMPLANT
GLOVE BIO SURGEON STRL SZ 6 (GLOVE) ×2 IMPLANT
GLOVE BIO SURGEON STRL SZ7.5 (GLOVE) ×2 IMPLANT
GLOVE BIOGEL PI IND STRL 6.5 (GLOVE) ×2 IMPLANT
GLOVE BIOGEL PI IND STRL 8 (GLOVE) ×2 IMPLANT
GOWN STRL REUS W/ TWL LRG LVL3 (GOWN DISPOSABLE) ×2 IMPLANT
GOWN STRL REUS W/ TWL XL LVL3 (GOWN DISPOSABLE) ×2 IMPLANT
GOWN STRL REUS W/TWL LRG LVL3 (GOWN DISPOSABLE) ×1
GOWN STRL REUS W/TWL XL LVL3 (GOWN DISPOSABLE) ×1
KIT ROOT REPAIR MEINISCAL PEEK (Anchor) IMPLANT
LOOP 2 FIBERLINK CLOSED (SUTURE) IMPLANT
MANIFOLD NEPTUNE II (INSTRUMENTS) ×2 IMPLANT
MEINISCAL ROOT REPAIR KIT PEEK (Anchor) ×1 IMPLANT
NDL HYPO 18GX1.5 BLUNT FILL (NEEDLE) ×2 IMPLANT
NDL SAFETY ECLIP 18X1.5 (MISCELLANEOUS) ×2 IMPLANT
NDL SUT 2-0 SCORPION KNEE (NEEDLE) IMPLANT
NEEDLE HYPO 18GX1.5 BLUNT FILL (NEEDLE) ×1 IMPLANT
NEEDLE SUT 2-0 SCORPION KNEE (NEEDLE) IMPLANT
PACK ARTHROSCOPY DSU (CUSTOM PROCEDURE TRAY) ×2 IMPLANT
PACK BASIN DAY SURGERY FS (CUSTOM PROCEDURE TRAY) ×2 IMPLANT
PAD COLD SHLDR WRAP-ON (PAD) ×2 IMPLANT
PADDING CAST COTTON 6X4 STRL (CAST SUPPLIES) ×2 IMPLANT
PENCIL SMOKE EVACUATOR (MISCELLANEOUS) IMPLANT
PORT APPOLLO RF 90DEGREE MULTI (SURGICAL WAND) ×2 IMPLANT
SLEEVE SCD COMPRESS KNEE MED (STOCKING) ×2 IMPLANT
SUT ETHILON 3 0 PS 1 (SUTURE) ×2 IMPLANT
SUT VIC AB 2-0 SH 27 (SUTURE)
SUT VIC AB 2-0 SH 27XBRD (SUTURE) IMPLANT
SUT VIC AB 3-0 FS2 27 (SUTURE) IMPLANT
SYR 5ML LL (SYRINGE) ×2 IMPLANT
TOWEL GREEN STERILE FF (TOWEL DISPOSABLE) ×4 IMPLANT
TRAY DSU PREP LF (CUSTOM PROCEDURE TRAY) ×2 IMPLANT
TUBING ARTHROSCOPY IRRIG 16FT (MISCELLANEOUS) ×2 IMPLANT

## 2022-07-30 NOTE — Discharge Instructions (Signed)
     Discharge Instructions    Attending Surgeon: Vanetta Mulders, MD Office Phone Number: [phone]   Diagnosis and Procedures:    Surgeries Performed: Left knee meniscal repair  Discharge Plan:    Diet: Resume usual diet. Begin with light or bland foods.  Drink plenty of fluids.  Activity:  Touchdown weight bearing with brace locked in extension for 2 weeks. You are advised to go home directly from the hospital or surgical center. Restrict your activities.  GENERAL INSTRUCTIONS: 1.  Please apply ice to your wound to help with swelling and inflammation. This will improve your comfort and your overall recovery following surgery.     2. Please call Dr. Eddie Dibbles office at [phone] with questions Monday-Friday during business hours. If no one answers, please leave a message and someone should get back to the patient within 24 hours. For emergencies please call 911 or proceed to the emergency room.   3. Patient to notify surgical team if experiences any of the following: Bowel/Bladder dysfunction, uncontrolled pain, nerve/muscle weakness, incision with increased drainage or redness, nausea/vomiting and Fever greater than 101.0 F.  Be alert for signs of infection including redness, streaking, odor, fever or chills. Be alert for excessive pain or bleeding and notify your surgeon immediately.  WOUND INSTRUCTIONS:   Leave your dressing, cast, or splint in place until your post operative visit.  Keep it clean and dry.  Always keep the incision clean and dry until the staples/sutures are removed. If there is no drainage from the incision you should keep it open to air. If there is drainage from the incision you must keep it covered at all times until the drainage stops  Do not soak in a bath tub, hot tub, pool, lake or other body of water until 21 days after your surgery and your incision is completely dry and healed.  If you have removable sutures (or staples) they must be removed  10-14 days (unless otherwise instructed) from the day of your surgery.     1)  Elevate the extremity as much as possible.  2)  Keep the dressing clean and dry.  3)  Please call us if the dressing becomes wet or dirty.  4)  If you are experiencing worsening pain or worsening swelling, please call.     MEDICATIONS: Resume all previous home medications at the previous prescribed dose and frequency unless otherwise noted Start taking the  pain medications on an as-needed basis as prescribed  Please taper down pain medication over the next week following surgery.  Ideally you should not require a refill of any narcotic pain medication.  Take pain medication with food to minimize nausea. In addition to the prescribed pain medication, you may take over-the-counter pain relievers such as Tylenol.  Do NOT take additional tylenol if your pain medication already has tylenol in it.  Aspirin 325mg  daily for four weeks.      FOLLOWUP INSTRUCTIONS: 1. Follow up at the Physical Therapy Clinic 3-4 days following surgery. This appointment should be scheduled unless other arrangements have been made.The Physical Therapy scheduling number is [phone] if an appointment has not already been arranged.  2. Contact Dr. Eddie Dibbles office during office hours at [phone] or the practice after hours line at [phone] for non-emergencies. For medical emergencies call 911.   Discharge Location: Home

## 2022-07-30 NOTE — Anesthesia Postprocedure Evaluation (Signed)
Anesthesia Post Note  Patient: [name] [name]  Procedure(s) Performed: LEFT KNEE ARTHROSCOPY WITH MEDIAL MENISCAL ROOT REPAIR (Left: Knee)     Patient location during evaluation: PACU Anesthesia Type: General Level of consciousness: awake and alert and oriented Pain management: pain level controlled Vital Signs Assessment: post-procedure vital signs reviewed and stable Respiratory status: spontaneous breathing, nonlabored ventilation and respiratory function stable Cardiovascular status: blood pressure returned to baseline and stable Postop Assessment: no apparent nausea or vomiting Anesthetic complications: no   No notable events documented.  Last Vitals:  Vitals:   07/30/22 1455 07/30/22 1513  BP: (!) 144/70 (!) 145/67  Pulse: 82 85  Resp: 17 16  Temp:  (!) 36.3 C  SpO2: 92% 93%    Last Pain:  Vitals:   07/30/22 1513  TempSrc: Temporal  PainSc: 5                  Blessing Ozga A.

## 2022-07-30 NOTE — H&P (Addendum)
Chief Complaint: Left knee pain        History of Present Illness:      06/27/2022: [name] [name] presents today for MRI follow-up of her left knee.  The knee continues to be painful and swollen and feel as though it is unstable.   [name] [name] is a 67 y.o. female presents today with ongoing left knee pain since July of this year.  She not have any specific known injury although she did feel the pain increasingly when she was getting into a car.  She feels like it will occasionally grab or get stuck.  She has a history of a Baker's cyst which was previously diagnosed by an orthopedic surgeon in [place].  She was given a shot of steroid as well as hyaluronic acid none of which provided significant relief.  She feels like the knee is giving out at this time.  She is here today for further assessment.  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       Surgical History:   none   PMH/PSH/Family History/Social History/Meds/Allergies:         Past Medical History:  Diagnosis Date   Arthritis     Bunion      left foot   Depression      stable on medication   Diverticulitis 2015   Diverticulitis of colon without hemorrhage 09/28/2013   Diverticulosis     Gallstones     GERD (gastroesophageal reflux disease)     Hammer toes of both feet     Hyperlipidemia      no meds needed   IBS (irritable bowel syndrome)     Meniscal injury      Tear   Onychomycosis of toenail     Osteopenia     Osteoporosis     PUD (peptic ulcer disease) 1980's   Renal cyst      4.5-6 cm stable single  seen by uro in past         Past Surgical History:  Procedure Laterality Date   CHOLECYSTECTOMY   1987   COLONOSCOPY       IRRIGATION AND DEBRIDEMENT SEBACEOUS CYST Right 3/ 2014    chest wall   UPPER GASTROINTESTINAL ENDOSCOPY       WISDOM TOOTH EXTRACTION   age 41 & 60    lower teeth done first and upper wisdom teeth last    Social History          Socioeconomic History   Marital status: Divorced      Spouse name: Not on file   Number of children: 0   Years of education: Not on file   Highest education level: Not on file  Occupational History   Occupation: TEACHER      Employer: [employer]  Tobacco Use   Smoking status: Never   Smokeless tobacco: Never  Vaping Use   Vaping Use: Never used  Substance and Sexual Activity   Alcohol use: No   Drug use: No   Sexual activity: Not Currently      Birth control/protection: Post-menopausal  Other Topics Concern   Not on file  Social History Narrative    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    Non smoker     Hh of 1 ... 2 cats     Single    G0P0    Mom is now in assisted living and she  has moved into her old residence    Social Determinants of [place] Strain: Low Risk  (04/25/2022)    Overall Financial Resource Strain (CARDIA)     Difficulty of Paying Living Expenses: Not hard at all  Food Insecurity: No Food Insecurity (04/25/2022)    Hunger Vital Sign     Worried About Running Out of Food in the Last Year: Never true     [place] in the Last Year: Never true  Transportation Needs: No Transportation Needs (04/25/2022)    PRAPARE - Armed forces logistics/support/administrative officer (Medical): No     Lack of Transportation (Non-Medical): No  Physical Activity: Insufficiently Active (04/25/2022)    Exercise Vital Sign     Days of Exercise per Week: 3 days     Minutes of Exercise per Session: 30 min  Stress: Stress Concern Present (04/25/2022)    [place]     Feeling of Stress : To some extent  Social Connections: Moderately Integrated (04/25/2022)    Social Connection and Isolation Panel [NHANES]     Frequency of Communication with Friends and Family: More than three times a week     Frequency of Social Gatherings with Friends and Family: More than  three times a week     Attends Religious Services: More than 4 times per year     Active Member of [employer] or Organizations: Yes     Attends Music therapist: More than 4 times per year     Marital Status: Divorced         Family History  Problem Relation Age of Onset   Rheum arthritis Father     Pneumonia Father          deceased   Colon cancer Father 27   Heart attack Father     Breast cancer Sister 5   Colon polyps Sister     Breast cancer Paternal Grandmother 56   Osteoarthritis Mother     COPD Mother     Pneumonia Mother     Osteoporosis Mother     Cancer Maternal Grandmother          ? GI   Heart failure Maternal Grandfather     Heart failure Paternal Grandfather     Esophageal cancer Neg Hx     Rectal cancer Neg Hx     Stomach cancer Neg Hx           Allergies  Allergen Reactions   Fosamax [Alendronate Sodium] Other (See Comments)      Intense pain   Tylenol [Acetaminophen] Other (See Comments)      "Irritates my esophagus"          Current Outpatient Medications  Medication Sig Dispense Refill   betamethasone valerate ointment (VALISONE) 0.1 % Apply a pea sized amount topically 1-2 x a week prn 30 g 0   Calcium Carb-Cholecalciferol (CALCIUM 600+D3 PO) Take 1 tablet by mouth daily.       Cholecalciferol (VITAMIN D PO) Take 2,000 mg by mouth every other day.        denosumab (PROLIA) 60 MG/ML SOSY injection Inject 60 mg into the skin every 6 (six) months.       famotidine (PEPCID) 40 MG tablet Take 1 tablet (40 mg total) by mouth 2 (two) times daily. 60 tablet 1   FLUoxetine (PROZAC) 10 MG tablet  Take 5 mg by mouth daily.       FLUoxetine (PROZAC) 40 MG capsule Take 40 mg by mouth daily.       fluticasone (FLONASE) 50 MCG/ACT nasal spray Place 1 spray into both nostrils daily. 16 g 0   Misc. Devices MISC Apply 1 Dose topically daily. Salicylic acid for toe nails       Multiple Vitamins-Minerals (CENTRUM ULTRA WOMENS PO) Take 1 tablet by mouth every  other day.       nystatin-triamcinolone ointment (MYCOLOG) Apply 1 application topically 2 (two) times daily. 30 g 1   omeprazole (PRILOSEC) 20 MG capsule Take 1 capsule (20 mg total) by mouth daily. Please schedule a yearly follow up for continued medication management 90 capsule 0   Probiotic Product (PROBIOTIC PO) Take by mouth daily.                 Current Facility-Administered Medications  Medication Dose Route Frequency Provider Last Rate Last Admin   0.9 %  sodium chloride infusion  500 mL Intravenous Once Nelida Meuse III, MD        Imaging Results (Last 48 hours)  No results found.     Review of Systems:   A ROS was performed including pertinent positives and negatives as documented in the HPI.   Physical Exam :   Constitutional: NAD and appears stated age Neurological: Alert and oriented Psych: Appropriate affect and cooperative Last menstrual period 11/18/2004.    Comprehensive Musculoskeletal Exam:     Left knee with tenderness palpation about the medial joint space with positive McMurray.  Trace effusion, negative laxity with varus or valgus stress negative Lachman range of motion from -3 to 130 degrees   Imaging:   Xray (left knee 4 views): Very mild joint space narrowing medially otherwise well-preserved knee   Right left knee: There is a tear of the posterior medial meniscal root with some degenerative findings predominantly about the medial tibiofemoral joint   I personally reviewed and interpreted the radiographs.     Assessment:   67 y.o. female with right knee medial meniscal root tear.  I did describe that this is in the setting of moderate degenerative changes about the medial joint.  At this time she is persistently feeling like the knee is unstable which I did describe is likely result of her meniscal insufficiency as the meniscus is a secondary stabilizer despite intact cruciate ligaments.  Overall I did discuss that her situation is somewhat tricky  as she does have moderate degenerative findings about the tibiofemoral joint.  Overall I do believe that she would benefit from the meniscal root repair as she has previously had injections of steroid as well as gel.  This is only transient in terms of giving her relief.  I did discuss that despite having a meniscal repair she may have some prolonged arthritic type pain although overall I would believe that this would improve her somewhat.  She will send [place] a MyChart message should she want to further discuss meniscal root Plan :     -Plan for right knee medial meniscal root repair         I personally saw and evaluated the patient, and participated in the management and treatment plan.   Vanetta Mulders, MD Attending Physician, Orthopedic Surgery   This document was dictated using Dragon voice recognition software. A reasonable attempt at proof reading has been made to minimize errors.

## 2022-07-30 NOTE — Anesthesia Preprocedure Evaluation (Addendum)
Anesthesia Evaluation  Patient identified by MRN, date of birth, ID band Patient awake    Reviewed: Allergy & Precautions, NPO status , Patient's Chart, lab work & pertinent test results  Airway Mallampati: II  TM Distance: >3 FB Neck ROM: Full    Dental  (+) Teeth Intact, Caps, Dental Advisory Given   Pulmonary shortness of breath and with exertion   Pulmonary exam normal breath sounds clear to auscultation       Cardiovascular negative cardio ROS Normal cardiovascular exam Rhythm:Regular Rate:Normal     Neuro/Psych  PSYCHIATRIC DISORDERS  Depression     Neuromuscular disease    GI/Hepatic Neg liver ROS, PUD,GERD  Controlled and Medicated,,IBS Diverticulosis   Endo/Other  Hyperlipidemia  Renal/GU Renal diseaseHx/o renal calculi  negative genitourinary   Musculoskeletal  (+) Arthritis , Osteoarthritis,  Osteopenia TMM left knee   Abdominal  (+) + obese  Peds negative pediatric ROS (+)  Hematology   Anesthesia Other Findings   Reproductive/Obstetrics                              Anesthesia Physical Anesthesia Plan  ASA: 2  Anesthesia Plan: General   Post-op Pain Management: Minimal or no pain anticipated, Regional block* and Dilaudid IV   Induction: Intravenous  PONV Risk Score and Plan: 4 or greater and Treatment may vary due to age or medical condition, Ondansetron and Dexamethasone  Airway Management Planned: LMA  Additional Equipment: None  Intra-op Plan:   Post-operative Plan: Extubation in OR  Informed Consent: I have reviewed the patients History and Physical, chart, labs and discussed the procedure including the risks, benefits and alternatives for the proposed anesthesia with the patient or authorized representative who has indicated his/her understanding and acceptance.     Dental advisory given  Plan Discussed with: CRNA and Anesthesiologist  Anesthesia Plan  Comments:         Anesthesia Quick Evaluation

## 2022-07-30 NOTE — Op Note (Signed)
Date of Surgery: 07/30/2022  INDICATIONS: Ms. Braggs is a 67 y.o.-year-old female with a left knee medial meniscal tear at the root.  The risk and benefits of the procedure were discussed in detail and documented in the pre-operative evaluation.   PREOPERATIVE DIAGNOSIS: 1.  Left medial meniscal root tear  POSTOPERATIVE DIAGNOSIS: Same.  PROCEDURE: 1.  Left medial meniscal repair  SURGEON: Yevonne Pax MD  ASSISTANT: Raynelle Fanning, ATC  ANESTHESIA:  general  IV FLUIDS AND URINE: See anesthesia record.  ANTIBIOTICS: Ancef  ESTIMATED BLOOD LOSS: 5 mL.  IMPLANTS:  Implant Name Type Inv. Item Serial No. Manufacturer Lot No. LRB No. Used Action  IMPL SYS MENISCAL ROOT REPAIR - [id number] Orthopedic Implant IMPL SYS MENISCAL ROOT REPAIR  [place] [id number] Left 1 Implanted    DRAINS: None  CULTURES: None  COMPLICATIONS: none  DESCRIPTION OF PROCEDURE:  Examination under anesthesia: A careful examination under anesthesia was performed.  Knee ROM motion was: 0-135 Lachman: Normal Pivot Shift: Normal Posterior drawer: normal.   Varus stability in full extension: normal.   Varus stability in 30 degrees of flexion: normal.  Valgus stability in full extension: normal.   Valgus stability in 30 degrees of flexion: normal.  Posterolateral drawer: normal   Intra-operative findings: A thorough arthroscopic examination of the knee was performed.  The findings are: 1. Suprapatellar pouch: Normal 2. Undersurface of median ridge: Normal 3. Medial patellar facet: Normal 4. Lateral patellar facet: Grade 4 chondral lesion 5. Trochlea: Central grade 2 trochlear wear 6. Lateral gutter/popliteus tendon: Normal 7. Hoffa's fat pad: Normal 8. Medial gutter/plica: Normal 9. ACL: Normal 10. PCL: Normal 11. Medial meniscus: Complete tear of the meniscal root medially 12. Medial compartment cartilage: Grade 2 changes involving the medial femoral condyle and tibial plateau 13. Lateral  meniscus: Normal 14. Lateral compartment cartilage: Normal  I identified the patient in the pre-operative holding area.  I marked the operative knee with my initials. I reviewed the risks and benefits of the proposed surgical intervention and the patient wished to proceed.  Anesthesia performed a peripheral nerve block.  Patient was subsequently taken back to the operating room.  The patient was transferred to the operative suite and placed in the supine position with all bony prominences padded.     SCDs were placed on the non-operative lower extremity. Appropriate antibiotics was administered within 1 hour before incision. The operative lower extremity was then prepped and draped in standard fashion. A time out was performed confirming the correct extremity, correct patient and correct procedure.    A standard anterolateral portal was made with an 11 blade.  The ideal position for the anteromedial portal was established using a spinal needle.  This AM portal was then created under direct visualization with an 11 blade.  A full diagnostic arthroscopy was then performed, as described above, including probing of the chondral and meniscal surfaces.     The meniscus root repair was then performed.  A knee scorpion from [employer] was then used to place two 0 non-absorbable sutures through the torn posterior horn meniscus 1.5 cm medial to the posterior root. The 2 free ends of the suture were passed through the loop, creating a locking stitch/luggage tag.   A tibial tunnel was created at the media meniscal root attachment using an ACL guide. Once the guide was positioned intra-articularly at the center of the meniscal root, the [employer] retrocutter was introduced up through the footprint of the root.  The sleeve was malleted  into place and a fiber stick was then placed.  The sutures were retrieved through the medial portal.  These were used to pass the sutures in the root.  Care was taken to ensure that no suture  bridge had formed.  The sutures of the meniscal root were then inserted into the tibia using a 4.75 mm swivel lock.  This was done by first drilling into the tibial bone and subsequently tapping the bone.  The sutures were tensioned and the anchor was placed.  Arthroscopy confirmed anatomic reduction of the root of the meniscus with good tension.  That concluded the case.  Skin was closed with 2-0 Vicryl and 3-0 nylon. Xeroform gauze, gauze, Tegaderm, Iceman and brace were applied.  Instrument, sponge, and needle counts were correct prior to wound closure and at the conclusion of the case.  The patient was taken to the PACU without complication     POSTOPERATIVE PLAN: She will be nonweightbearing in a brace locked in extension for a total of 2 weeks.  At this point I will plan to advance her weightbearing.  I will see her back in 2 weeks for reassessment and suture removal.  She will begin physical therapy this week  Yevonne Pax, MD 1:56 PM

## 2022-07-30 NOTE — Anesthesia Procedure Notes (Signed)
Procedure Name: LMA Insertion Date/Time: 07/30/2022 12:42 PM  Performed by: Lavonia Dana, CRNAPre-anesthesia Checklist: Patient identified, Emergency Drugs available, Suction available and Patient being monitored Patient Re-evaluated:Patient Re-evaluated prior to induction Oxygen Delivery Method: Circle system utilized Preoxygenation: Pre-oxygenation with 100% oxygen Induction Type: IV induction Ventilation: Mask ventilation without difficulty LMA: LMA inserted LMA Size: 4.0 Number of attempts: 1 Airway Equipment and Method: Bite block Placement Confirmation: positive ETCO2 Tube secured with: Tape Dental Injury: Teeth and Oropharynx as per pre-operative assessment

## 2022-07-30 NOTE — Brief Op Note (Signed)
   Brief Op Note  Date of Surgery: 07/30/2022  Preoperative Diagnosis: LEFT MEDIAL MENISCUS TEAR  Postoperative Diagnosis: same  Procedure: Procedure(s): LEFT KNEE ARTHROSCOPY WITH MEDIAL MENISCAL ROOT REPAIR  Implants: Implant Name Type Inv. Item Serial No. Manufacturer Lot No. LRB No. Used Action  IMPL SYS MENISCAL ROOT REPAIR - [id number] Orthopedic Implant IMPL SYS MENISCAL ROOT REPAIR  [place] [id number] Left 1 Implanted    Surgeons: Surgeon(s): Vanetta Mulders, MD  Anesthesia: General    Estimated Blood Loss: See anesthesia record  Complications: None  Condition to PACU: Stable  Yevonne Pax, MD 07/30/2022 1:56 PM

## 2022-07-30 NOTE — Transfer of Care (Signed)
Immediate Anesthesia Transfer of Care Note  Patient: [name] [name]  Procedure(s) Performed: LEFT KNEE ARTHROSCOPY WITH MEDIAL MENISCAL ROOT REPAIR (Left: Knee)  Patient Location: PACU  Anesthesia Type:General  Level of Consciousness: drowsy  Airway & Oxygen Therapy: Patient Spontanous Breathing and Patient connected to face mask oxygen  Post-op Assessment: Report given to RN and Post -op Vital signs reviewed and stable  Post vital signs: Reviewed and stable  Last Vitals:  Vitals Value Taken Time  BP 142/83 07/30/22 1406  Temp    Pulse 93 07/30/22 1408  Resp 21 07/30/22 1408  SpO2 96 % 07/30/22 1408  Vitals shown include unvalidated device data.  Last Pain:  Vitals:   07/30/22 1053  TempSrc: Oral  PainSc: 0-No pain      Patients Stated Pain Goal: 4 [id number]  Complications: No notable events documented.

## 2022-08-01 ENCOUNTER — Encounter: Payer: Self-pay | Admitting: Gastroenterology

## 2022-08-02 ENCOUNTER — Encounter (HOSPITAL_BASED_OUTPATIENT_CLINIC_OR_DEPARTMENT_OTHER): Payer: Self-pay | Admitting: Orthopaedic Surgery

## 2022-08-02 ENCOUNTER — Other Ambulatory Visit: Payer: Self-pay

## 2022-08-02 ENCOUNTER — Ambulatory Visit (HOSPITAL_BASED_OUTPATIENT_CLINIC_OR_DEPARTMENT_OTHER): Payer: Medicare PPO | Attending: Orthopaedic Surgery | Admitting: Physical Therapy

## 2022-08-02 DIAGNOSIS — Z4789 Encounter for other orthopedic aftercare: Secondary | ICD-10-CM | POA: Insufficient documentation

## 2022-08-02 DIAGNOSIS — M25662 Stiffness of left knee, not elsewhere classified: Secondary | ICD-10-CM | POA: Diagnosis not present

## 2022-08-02 DIAGNOSIS — Z9889 Other specified postprocedural states: Secondary | ICD-10-CM | POA: Insufficient documentation

## 2022-08-02 DIAGNOSIS — M25562 Pain in left knee: Secondary | ICD-10-CM | POA: Diagnosis not present

## 2022-08-02 DIAGNOSIS — R262 Difficulty in walking, not elsewhere classified: Secondary | ICD-10-CM | POA: Insufficient documentation

## 2022-08-02 DIAGNOSIS — M6281 Muscle weakness (generalized): Secondary | ICD-10-CM | POA: Diagnosis not present

## 2022-08-02 NOTE — Therapy (Addendum)
OUTPATIENT PHYSICAL THERAPY LOWER EXTREMITY EVALUATION   Patient Name: [name] [name] MRN: [id number] DOB:1955-09-24, 67 y.o., female Today's Date: 08/03/2022  END OF SESSION:  PT End of Session - 08/02/22 1622     Visit Number 1    Number of Visits 28    Date for PT Re-Evaluation 10/25/22    Authorization Type [employer] MCR    PT Start Time 1450    PT Stop Time 1540    PT Time Calculation (min) 50 min    Activity Tolerance Patient tolerated treatment well    Behavior During Therapy WFL for tasks assessed/performed             Past Medical History:  Diagnosis Date   Arthritis    Bunion    left foot   Depression    stable on medication   Diverticulitis 2015   Diverticulitis of colon without hemorrhage 09/28/2013   Diverticulosis    Gallstones    GERD (gastroesophageal reflux disease)    Hammer toes of both feet    Hyperlipidemia    no meds needed   IBS (irritable bowel syndrome)    Meniscal injury    Tear   Onychomycosis of toenail    Osteopenia    Osteoporosis    PUD (peptic ulcer disease) 1980's   Renal cyst    4.5-6 cm stable single  seen by uro in past   Past Surgical History:  Procedure Laterality Date   CHOLECYSTECTOMY  1987   COLONOSCOPY     IRRIGATION AND DEBRIDEMENT SEBACEOUS CYST Right 3/ 2014   chest wall   KNEE ARTHROSCOPY WITH MENISCAL REPAIR Left 07/30/2022   Procedure: LEFT KNEE ARTHROSCOPY WITH MEDIAL MENISCAL ROOT REPAIR;  Surgeon: Vanetta Mulders, MD;  Location: [place];  Service: Orthopedics;  Laterality: Left;   UPPER GASTROINTESTINAL ENDOSCOPY     WISDOM TOOTH EXTRACTION  age 67 & 63   lower teeth done first and upper wisdom teeth last   Patient Active Problem List   Diagnosis Date Noted   Acute medial meniscus tear of left knee 07/30/2022   Elevated alkaline phosphatase measurement 09/26/2014   Renal cyst, left 12/31/2013   Osteopenia 12/31/2013   Vitamin D deficiency 12/31/2013   Postmenopausal atrophic  vaginitis 12/31/2013   Family history of colon cancer 10/02/2013   Wrist pain, right 05/13/2013   Hx of fall 05/13/2013   Hypercholesteremia 05/13/2013   History of kidney stones    Musculoskeletal leg pain 02/02/2013   Gait abnormality 02/02/2013   Chest discomfort 05/28/2011   Left wrist injury 12/01/2010   Renal cyst 12/01/2010   Cough 09/27/2010   Neck pain 08/29/2010   Tingling sensation 08/29/2010   Allergic rhinitis, seasonal 08/29/2010   LEG PAIN, RIGHT 07/01/2009   VARICOSE VEINS LOWER EXTREMITIES W/OTH COMPS 12/06/2008   BACK PAIN, THORACIC REGION 12/06/2008   BACK PAIN 11/10/2008   BRACHIAL NEURITIS OR RADICULITIS NOS 10/29/2008   Irritable bowel syndrome 04/23/2007   HYPERLIPIDEMIA NEC/NOS 01/10/2007   DEPRESSION 01/08/2007   GERD 01/08/2007   Diverticulosis of colon (without mention of hemorrhage) 07/28/2002     REFERRING PROVIDER: Vanetta Mulders, MD   REFERRING DIAG: M23.92 (ICD-10-CM) - Acute internal derangement of left knee     S/p L knee medial meniscal root repair  THERAPY DIAG:  Left knee pain, unspecified chronicity  Stiffness of left knee, not elsewhere classified  Muscle weakness (generalized)  Difficulty in walking, not elsewhere classified  Rationale for Evaluation and Treatment: Rehabilitation  ONSET DATE: DOS 07/30/2022  SUBJECTIVE:   SUBJECTIVE STATEMENT: Pain began in July without any specific MOI.  Pt states the way she was getting out of her car may attributed to it.  Pt received a steroid shot and hyaluronic acid which didn't provide significant relief.  Pt went to see Dr. Sammuel Hines and he ordered a MRI which showed a large radial tear at the root of the posterior horn of the medial meniscus.      Pt underwent left knee medial meniscal root repair on 07/30/22.  Op note indicated the post op plan for pt to be nonweightbearing in a brace locked in extension for a total of 2 weeks and plan to advance her weightbearing at that point.  Pt to  return to MD in 2 weeks for reassessment and suture removal and to begin physical therapy this week.  PT order indicates medial meniscus root repair left knee.  Pt uses ice at home.   Pt is limited with all of her normal functional mobility skills including ambulation and transfers.  Pt is limited with standing.  Pt can have pain with bed mobility depending how she move.   Pt unable to drive.  Pt is unable to perform volunteer work at [employer].   PERTINENT HISTORY: -Status post left knee medial meniscal root repair 07/30/2022.  [place] for a total of 2 weeks with brace locked in extension. -MRI findings of L knee moderate medial compartment deg changes -Pt has a Hx of Baker's cyst.   -Osteoporosis, OA, and lumbar pain  PAIN:  Are you having pain? Yes NPRS:  2/10 current, 3/10 worst, 1/10 best Location:  L knee  PRECAUTIONS: Other: per surgical protocol and Wb'ing restrictions  WEIGHT BEARING RESTRICTIONS: Yes NWB x 2 weeks with brace locked in extension  FALLS:  Has patient fallen in last 6 months? No  LIVING ENVIRONMENT: Lives with: lives alone Lives in: 1 story home Stairs: ramp to enter home.  Pt has 1 step in home with a rail.  Has following equipment at home:  Gordon.  Sister has a cane.  OCCUPATION: Pt is retired  PLOF: Independent.  Pt was able to perform her normal functional mobility skills and daily activities independently without difficulty.  Pt ambulated without an AD.  Pt able to perform volunteer work at [employer] which involved a lot of standing.    PATIENT GOALS: return to PLOF   OBJECTIVE:   DIAGNOSTIC FINDINGS: Pt is post op.  MRI before surgery also showed mod medial compartment deg changes with mild patellofemoral and lateral compartment deg chondrosis and a tiny Baker's cyst.    PATIENT SURVEYS:  FOTO Will give next visit  COGNITION: Overall cognitive status: Within functional limits for tasks assessed       OBSERVATION:  Pt L knee in KI locked in  extension.  She has an ACE bandage, abd pad and guaze over portals.  PT removed the old dressings.  Pt's portals are intact with stitches.  She has some expected bloody drainage on bandages though excessive drainage from the portals.  She has no signs of infection.  PT applied new gauze and tegaderm over portals and reapplied ACE bandage.  Pt adjusted brace to a better fit and donned brace on pt's R LE.       LOWER EXTREMITY ROM:  AROM Right eval Left eval  Hip flexion    Hip extension    Hip abduction    Hip adduction  Hip internal rotation    Hip external rotation    Knee flexion 118   Knee extension 0 WFL  Ankle dorsiflexion    Ankle plantarflexion    Ankle inversion    Ankle eversion     (Blank rows = not tested)  LOWER EXTREMITY MMT:  Strength not tested due to healing constraints and surgical protocol    GAIT: Comments: Pt presents to Rx with FWW applying some weight thru L LE with gait.  PT educated pt in [place] restrictions of [place].  PT demonstrated hop to gait pattern with FWW and instructed pt in correct form and foot placement.  PT instructed pt in slowing down and taking her time taking small hops.  PT demonstrated hop to gait with FWW in the clinic.   TODAY'S TREATMENT:                                                                                                                               Gait training with [place] restrictions.  See above. Pt performed quad sets with 5 sec hold x 10 reps and ankle pumps x 20 reps.  Pt received a HEP handout and was educated in correct form and appropriate frequency.    PATIENT EDUCATION:  Education details: Educated pt on dressings.  Proper brace fit.  NWB'ing restrictions, gait, and using a standard walker or FWW.  Post op and protocol restrictions and MD orders.  Dx, relevant anatomy, HEP, and POC.  PT answered pt's questions.   Person educated: Patient and sister Education method: Explanation, Demonstration, Tactile  cues, Verbal cues, and Handouts Education comprehension: verbalized understanding, returned demonstration, verbal cues required, tactile cues required, and needs further education  HOME EXERCISE PROGRAM: Access Code: [email or web address] URL: [email or web address] Date: 08/02/2022 Prepared by: Ronny Flurry  Exercises - Supine Quadricep Sets  - 2 x daily - 7 x weekly - 2 sets - 10 reps - 5 seconds hold - ANKLE PUMPS   20-30 every hour  ASSESSMENT:  CLINICAL IMPRESSION: Patient is a 67 y.o. female 3 days status post left medial meniscal root repair presenting to the clinic wit expected post op findings of L knee pain, limited ROM in L knee, muscle weakness in L LE, and difficulty in walking.  Pt is limited with all of her normal functional mobility skills including ambulation and transfers.  Pt is limited with standing.  She has [place] precautions and is using a FWW.  Pt is unable to perform her  volunteer work.  Pt should benefit from skilled PT services per protocol to address impairments and improve overall function.   OBJECTIVE IMPAIRMENTS: Abnormal gait, decreased activity tolerance, decreased endurance, decreased knowledge of use of DME, decreased mobility, difficulty walking, decreased ROM, decreased strength, hypomobility, increased edema, impaired flexibility, and pain.   ACTIVITY LIMITATIONS: bending, standing, squatting, stairs, transfers, bed mobility, dressing, and locomotion level  PARTICIPATION LIMITATIONS: meal prep, cleaning, laundry, driving, shopping, community activity, and volunteer  work  PERSONAL FACTORS: 3+ comorbidities: moderate deg changes, osteoporosis, and lumbar pain  are also affecting patient's functional outcome.   REHAB POTENTIAL: Good  CLINICAL DECISION MAKING: Stable/uncomplicated  EVALUATION COMPLEXITY: Low   GOALS:  SHORT TERM GOALS: Target date: 08/30/2022  Pt will be independent and compliant with HEP for improved pain, ROM, strength, and  function.  Baseline: Goal status: INITIAL  2.  Pt will progress with Nec[place] per MD orders without adverse effects for improved mobility.  Baseline:  Goal status: INITIAL  3.  Pt will demo L knee flexion PROM to be 90 deg for improved stiffness and mobility.  Baseline:  Goal status: INITIAL  4.  Pt will demo a good quad set and perform a supine SLR independently without significant extensor lag for improved strength.  Baseline:  Goal status: INITIAL  5.  Pt will be able to perform a 6 inch step up with good form and control.  Baseline:  Goal status: INITIAL Target date:  10/11/2022  6.  Pt will ambulate with a normalized heel to toe gait pattern without AD.  Baseline:  Goal status: INITIAL Target date:  10/25/2022  7.  Pt will demo L knee AROM to 0 - 115 deg for improved mobility and stiffness.  Baseline:  Goal status: INITIAL Target date:  10/25/2022   LONG TERM GOALS: Target date: 11/22/2022   Pt will ambulate extended community distance without difficulty and significant pain.  Baseline:  Goal status: INITIAL  2.  Pt will be able to perform stairs with a reciprocal gait with the rail.   Baseline:  Goal status: INITIAL  3.  Pt will be able to perform her ADLs and IADLs without significant pain and difficulty.  Baseline:  Goal status: INITIAL  4.  Pt will be able to perform her volunteer work without adverse effects.  Baseline:  Goal status: INITIAL  5.  Pt will demo 5/5 strength in L hip flex and abd and at least 4+/5 strength in L knee flex and extension for improved performance of and tolerance with functional mobility.  Baseline:  Goal status: INITIAL     PLAN:  PT FREQUENCY:  1x/wk x 4 weeks and 2x/wk afterwards  PT DURATION: other: 16 weeks  PLANNED INTERVENTIONS: Therapeutic exercises, Therapeutic activity, Neuromuscular re-education, Balance training, Gait training, Patient/Family education, Self Care, Joint mobilization, Stair training, Aquatic  Therapy, Dry Needling, Electrical stimulation, Cryotherapy, Moist heat, scar mobilization, Taping, Ultrasound, Manual therapy, and Re-evaluation  PLAN FOR NEXT SESSION:  Review and perform HEP.  Assess L knee ROM.  Attempt SLR.  Give FOTO.  Cont per Dr. Eddie Dibbles meniscus repair protocol.   Op note indicated the post op plan for pt to be nonweightbearing in a brace locked in extension for a total of 2 weeks and plan to advance her weightbearing at that point.  Referring diagnosis?  M23.92 (ICD-10-CM) - Acute internal derangement of left knee   Treatment diagnosis? (if different than referring diagnosis)   Left knee pain, unspecified chronicity  Stiffness of left knee, not elsewhere classified  Muscle weakness (generalized)  Difficulty in walking, not elsewhere classified What was this (referring dx) caused by? []  Surgery []  Fall []  Ongoing issue []  Arthritis []  Other: ____________  Laterality: []  Rt [x]  Lt []  Both  Check all possible CPT codes:  *CHOOSE 10 OR LESS*    [x]  97110 (Therapeutic Exercise)  []  92507 (SLP Treatment)  [x]  [id number] (Neuro Re-ed)   []  92526 (Swallowing Treatment)   [x]   97116 (Personnel officer)   []  [id number] (Cognitive Training, 1st 15 minutes) [x]  97140 (Manual Therapy)   []  97130 (Cognitive Training, each add'l 15 minutes)  [x]  97164 (Re-evaluation)                              []  Other, List CPT Code ____________  [x]  97530 (Therapeutic Activities)     [x]  97535 (Self Care)   []  All codes above (97110 - 97535)  []  97012 (Mechanical Traction)  [x]  97014 (E-stim Unattended)  [x]  97032 (E-stim manual)  []  57846 (Ionto)  [x]  97035 (Ultrasound) [x]  97750 (Physical Performance Training) [x]  [id number] (Aquatic Therapy) [x]  96295 (Vasopneumatic Device) []  [id number] (Paraffin) []  97034 (Contrast Bath) []  97597 (Wound Care 1st 20 sq cm) []  97598 (Wound Care each add'l 20 sq cm) []  97760 (Orthotic Fabrication, Fitting, Training Initial) []  [id number] (Prosthetic Management  and Training Initial) []  [id number] (Orthotic or Prosthetic Training/ Modification Subsequent)   Selinda Michaels III PT, DPT 08/03/22 4:07 PM

## 2022-08-03 ENCOUNTER — Encounter (HOSPITAL_BASED_OUTPATIENT_CLINIC_OR_DEPARTMENT_OTHER): Payer: Self-pay | Admitting: Orthopaedic Surgery

## 2022-08-06 ENCOUNTER — Encounter (HOSPITAL_BASED_OUTPATIENT_CLINIC_OR_DEPARTMENT_OTHER): Payer: Self-pay | Admitting: Orthopaedic Surgery

## 2022-08-08 NOTE — Therapy (Signed)
OUTPATIENT PHYSICAL THERAPY LOWER EXTREMITY EVALUATION   Patient Name: [name] [name] MRN: [id number] DOB:Oct 02, 1955, 67 y.o., female Today's Date: 08/09/2022  END OF SESSION:  PT End of Session - 08/09/22 1509     Visit Number 2    Number of Visits 28    Date for PT Re-Evaluation 10/25/22    Authorization Type [employer] MCR    PT Start Time 1503    PT Stop Time 1550    PT Time Calculation (min) 47 min    Activity Tolerance Patient tolerated treatment well    Behavior During Therapy WFL for tasks assessed/performed              Past Medical History:  Diagnosis Date   Arthritis    Bunion    left foot   Depression    stable on medication   Diverticulitis 2015   Diverticulitis of colon without hemorrhage 09/28/2013   Diverticulosis    Gallstones    GERD (gastroesophageal reflux disease)    Hammer toes of both feet    Hyperlipidemia    no meds needed   IBS (irritable bowel syndrome)    Meniscal injury    Tear   Onychomycosis of toenail    Osteopenia    Osteoporosis    PUD (peptic ulcer disease) 1980's   Renal cyst    4.5-6 cm stable single  seen by uro in past   Past Surgical History:  Procedure Laterality Date   [place] CYST Right 3/ 2014   chest wall   KNEE ARTHROSCOPY WITH MENISCAL REPAIR Left 07/30/2022   Procedure: LEFT KNEE ARTHROSCOPY WITH MEDIAL MENISCAL ROOT REPAIR;  Surgeon: Vanetta Mulders, MD;  Location: [facility];  Service: Orthopedics;  Laterality: Left;   UPPER GASTROINTESTINAL ENDOSCOPY     WISDOM TOOTH EXTRACTION  age 52 & 27   lower teeth done first and upper wisdom teeth last   Patient Active Problem List   Diagnosis Date Noted   Acute medial meniscus tear of left knee 07/30/2022   Elevated alkaline phosphatase measurement 09/26/2014   Renal cyst, left 12/31/2013   Osteopenia 12/31/2013   Vitamin D deficiency 12/31/2013   Postmenopausal atrophic  vaginitis 12/31/2013   Family history of colon cancer 10/02/2013   Wrist pain, right 05/13/2013   Hx of fall 05/13/2013   Hypercholesteremia 05/13/2013   History of kidney stones    Musculoskeletal leg pain 02/02/2013   Gait abnormality 02/02/2013   Chest discomfort 05/28/2011   Left wrist injury 12/01/2010   Renal cyst 12/01/2010   Cough 09/27/2010   Neck pain 08/29/2010   Tingling sensation 08/29/2010   Allergic rhinitis, seasonal 08/29/2010   LEG PAIN, RIGHT 07/01/2009   VARICOSE VEINS LOWER EXTREMITIES W/OTH COMPS 12/06/2008   BACK PAIN, THORACIC REGION 12/06/2008   BACK PAIN 11/10/2008   BRACHIAL NEURITIS OR RADICULITIS NOS 10/29/2008   Irritable bowel syndrome 04/23/2007   HYPERLIPIDEMIA NEC/NOS 01/10/2007   DEPRESSION 01/08/2007   GERD 01/08/2007   Diverticulosis of colon (without mention of hemorrhage) 07/28/2002     REFERRING PROVIDER: Vanetta Mulders, MD   REFERRING DIAG: M23.92 (ICD-10-CM) - Acute internal derangement of left knee     S/p L knee medial meniscal root repair  THERAPY DIAG:  Left knee pain, unspecified chronicity  Stiffness of left knee, not elsewhere classified  Muscle weakness (generalized)  Difficulty in walking, not elsewhere classified  Rationale for Evaluation and Treatment:  Rehabilitation  ONSET DATE: DOS 07/30/2022  SUBJECTIVE:   SUBJECTIVE STATEMENT: Pt is 1 week and 3 days s/p left knee medial meniscal root repair.  Op note indicated the post op plan for pt to be nonweightbearing in a brace locked in extension for a total of 2 weeks and plan to advance her weightbearing at that point.  Pt to return to MD in 2 weeks for reassessment and suture removal and to begin physical therapy this week.  PT order indicates medial meniscus root repair left knee.    Pt is limited with all of her normal functional mobility skills including ambulation and transfers.  Pt is limited with standing.  Pt unable to drive.  Pt is unable to perform  volunteer work at [employer].   "I haven't had much pain at all."  Pt states she has had some soreness in the evening.  Pt denies any adverse effects after prior Rx.  Pt reports compliance with HEP.    PERTINENT HISTORY: -Status post left knee medial meniscal root repair 07/30/2022.  [place] for a total of 2 weeks with brace locked in extension. -MRI findings of L knee moderate medial compartment deg changes -Pt has a Hx of Baker's cyst.   -Osteoporosis, OA, and lumbar pain  PAIN:  Are you having pain? Yes NPRS:  1/10 "almost 0" current, 3/10 worst, 1/10 best Location:  L knee  PRECAUTIONS: Other: per surgical protocol and Wb'ing restrictions  WEIGHT BEARING RESTRICTIONS: Yes NWB x 2 weeks with brace locked in extension  FALLS:  Has patient fallen in last 6 months? No  LIVING ENVIRONMENT: Lives with: lives alone Lives in: 1 story home Stairs: ramp to enter home.  Pt has 1 step in home with a rail.  Has following equipment at home:  [place].  Sister has a cane.  OCCUPATION: Pt is retired  PLOF: Independent.  Pt was able to perform her normal functional mobility skills and daily activities independently without difficulty.  Pt ambulated without an AD.  Pt able to perform volunteer work at [employer] which involved a lot of standing.    PATIENT GOALS: return to PLOF   OBJECTIVE:   DIAGNOSTIC FINDINGS: Pt is post op.  MRI before surgery also showed mod medial compartment deg changes with mild patellofemoral and lateral compartment deg chondrosis and a tiny Baker's cyst.    TODAY'S TREATMENT:    PATIENT SURVEYS:  FOTO 57 with a goal of 73 at visit #12     OBSERVATION:  L knee in KI locked in extension.  She has an ACE bandage over dressings.  PT removed tegaderm and gauze and applied new gauze and tegaderm over the portals/incision.  Portals are intact with stitches.  Portals are clean and dry with no drainage.  One portal/incision still had xeroform gauze over it.  She has no  signs of infection.         LOWER EXTREMITY ROM:  AROM Right eval Left eval Left 3/21  Hip flexion     Hip extension     Hip abduction     Hip adduction     Hip internal rotation     Hip external rotation     Knee flexion 118  AAROM:  41 deg   Knee extension 0 WFL AROM/PROM:  2/0  Ankle dorsiflexion     Ankle plantarflexion     Ankle inversion     Ankle eversion      (Blank rows = not tested)  LOWER EXTREMITY MMT:   Pt has fair Quad set Pt able to perform supine SLR well independently without brace without pain.  Pt performed: Quad sets with 5 sec hold AP's x 20 reps Supine SLR 2x10 reps  Longsitting knee flexion AAROM with hands under knee x 10 reps Supine knee flexion AAROM with strap x 10 reps  PT reviewed HEP and updated HEP.  Pt received a HEP handout and was educated in correct form and appropriate frequency.  Pt instructed in appropriate ROM including to not perform flexion AAROM into a painful or tight range and should not go past 90 deg per protocol.                                                                                                                                PATIENT EDUCATION:  Education details: Educated pt on dressings.  Proper brace fit.  NWB'ing restrictions and gait.  Post op and protocol restrictions and MD orders.  relevant anatomy, HEP, and POC.  PT answered pt's questions.  Updated HEP and gave pt a HEP handout.  Instructed pt in appropriate ROM p per protocol.   Person educated: Patient and sister Education method: Explanation, Demonstration, Tactile cues, Verbal cues, and Handouts Education comprehension: verbalized understanding, returned demonstration, verbal cues required, tactile cues required, and needs further education  HOME EXERCISE PROGRAM: Access Code: [email or web address] URL: [email or web address] Date: 08/02/2022 Prepared by: Ronny Flurry  Exercises - Supine Quadricep Sets  - 2 x daily - 7 x weekly - 2 sets - 10  reps - 5 seconds hold - ANKLE PUMPS   20-30 every hour  Updated HEP:  Supine Active Straight Leg Raise  - 1 x daily - 7 x weekly - 2 sets - 10 reps - Sitting Heel Slide with hands under thigh  - 2 x daily - 7 x weekly - 2 sets - 10 reps  ASSESSMENT:  CLINICAL IMPRESSION: Patient presents to Rx in a W/C and did bring her FWW.  Pt used her FWW to perform transfers and maintained [place] precautions.  PT assessed ROM.  She has expected limited flexion ROM.  Pt has full knee extension PROM and slight limitation in knee extension AROM.  Pt is able to perform supine SLR well independently without brace.  PT reviewed HEP and updated HEP.  Pt demonstrates good understanding of HEP.  PT changed dressings today and pt has no signs of infection.  Pt responded well to Rx having no c/o's and no increased pain after Rx.     Pt should benefit from continued skilled PT services per protocol to address goals and impairments and to improve overall function.   OBJECTIVE IMPAIRMENTS: Abnormal gait, decreased activity tolerance, decreased endurance, decreased knowledge of use of DME, decreased mobility, difficulty walking, decreased ROM, decreased strength, hypomobility, increased edema, impaired flexibility, and pain.   ACTIVITY LIMITATIONS: bending, standing, squatting, stairs, transfers, bed mobility, dressing, and locomotion level  PARTICIPATION LIMITATIONS: meal prep, cleaning, laundry, driving, shopping, community activity, and volunteer work  PERSONAL FACTORS: 3+ comorbidities: moderate deg changes, osteoporosis, and lumbar pain  are also affecting patient's functional outcome.   REHAB POTENTIAL: Good  CLINICAL DECISION MAKING: Stable/uncomplicated  EVALUATION COMPLEXITY: Low   GOALS:  SHORT TERM GOALS: Target date: 08/30/2022  Pt will be independent and compliant with HEP for improved pain, ROM, strength, and function.  Baseline: Goal status: INITIAL  2.  Pt will progress with [place] per MD  orders without adverse effects for improved mobility.  Baseline:  Goal status: INITIAL  3.  Pt will demo L knee flexion PROM to be 90 deg for improved stiffness and mobility.  Baseline:  Goal status: INITIAL  4.  Pt will demo a good quad set and perform a supine SLR independently without significant extensor lag for improved strength.  Baseline:  Goal status: INITIAL  5.  Pt will be able to perform a 6 inch step up with good form and control.  Baseline:  Goal status: INITIAL Target date:  10/11/2022  6.  Pt will ambulate with a normalized heel to toe gait pattern without AD.  Baseline:  Goal status: INITIAL Target date:  10/25/2022  7.  Pt will demo L knee AROM to 0 - 115 deg for improved mobility and stiffness.  Baseline:  Goal status: INITIAL Target date:  10/25/2022   LONG TERM GOALS: Target date: 11/22/2022   Pt will ambulate extended community distance without difficulty and significant pain.  Baseline:  Goal status: INITIAL  2.  Pt will be able to perform stairs with a reciprocal gait with the rail.   Baseline:  Goal status: INITIAL  3.  Pt will be able to perform her ADLs and IADLs without significant pain and difficulty.  Baseline:  Goal status: INITIAL  4.  Pt will be able to perform her volunteer work without adverse effects.  Baseline:  Goal status: INITIAL  5.  Pt will demo 5/5 strength in L hip flex and abd and at least 4+/5 strength in L knee flex and extension for improved performance of and tolerance with functional mobility.  Baseline:  Goal status: INITIAL     PLAN:  PT FREQUENCY:  1x/wk x 4 weeks and 2x/wk afterwards  PT DURATION: other: 16 weeks  PLANNED INTERVENTIONS: Therapeutic exercises, Therapeutic activity, Neuromuscular re-education, Balance training, Gait training, Patient/Family education, Self Care, Joint mobilization, Stair training, Aquatic Therapy, Dry Needling, Electrical stimulation, Cryotherapy, Moist heat, scar mobilization,  Taping, Ultrasound, Manual therapy, and Re-evaluation  PLAN FOR NEXT SESSION:  Review and perform HEP.  Cont per Dr. Eddie Dibbles meniscus repair protocol.  Pt sees MD tomorrow. Op note indicated the post op plan for pt to be nonweightbearing in a brace locked in extension for a total of 2 weeks and plan to advance her weightbearing at that point.   Selinda Michaels III PT, DPT 08/09/22 4:36 PM

## 2022-08-09 ENCOUNTER — Encounter (HOSPITAL_BASED_OUTPATIENT_CLINIC_OR_DEPARTMENT_OTHER): Payer: Self-pay | Admitting: Physical Therapy

## 2022-08-09 ENCOUNTER — Ambulatory Visit (HOSPITAL_BASED_OUTPATIENT_CLINIC_OR_DEPARTMENT_OTHER): Payer: Medicare PPO | Admitting: Physical Therapy

## 2022-08-09 DIAGNOSIS — M25562 Pain in left knee: Secondary | ICD-10-CM

## 2022-08-09 DIAGNOSIS — M6281 Muscle weakness (generalized): Secondary | ICD-10-CM | POA: Diagnosis not present

## 2022-08-09 DIAGNOSIS — R262 Difficulty in walking, not elsewhere classified: Secondary | ICD-10-CM

## 2022-08-09 DIAGNOSIS — M25662 Stiffness of left knee, not elsewhere classified: Secondary | ICD-10-CM | POA: Diagnosis not present

## 2022-08-09 DIAGNOSIS — Z4789 Encounter for other orthopedic aftercare: Secondary | ICD-10-CM | POA: Diagnosis not present

## 2022-08-09 DIAGNOSIS — Z9889 Other specified postprocedural states: Secondary | ICD-10-CM | POA: Diagnosis not present

## 2022-08-10 ENCOUNTER — Ambulatory Visit (INDEPENDENT_AMBULATORY_CARE_PROVIDER_SITE_OTHER): Payer: Medicare PPO | Admitting: Orthopaedic Surgery

## 2022-08-10 DIAGNOSIS — M23322 Other meniscus derangements, posterior horn of medial meniscus, left knee: Secondary | ICD-10-CM

## 2022-08-10 NOTE — Progress Notes (Signed)
Post Operative Evaluation    Procedure/Date of Surgery: Left knee medial meniscal root repair 3/11  Interval History:   Presents today 2 weeks status post left knee medial meniscal root repair overall doing very well.  She is been compliant with nonweightbearing status.  She has been locked in her hinged brace.   PMH/PSH/Family History/Social History/Meds/Allergies:    Past Medical History:  Diagnosis Date   Arthritis    Bunion    left foot   Depression    stable on medication   Diverticulitis 2015   Diverticulitis of colon without hemorrhage 09/28/2013   Diverticulosis    Gallstones    GERD (gastroesophageal reflux disease)    Hammer toes of both feet    Hyperlipidemia    no meds needed   IBS (irritable bowel syndrome)    Meniscal injury    Tear   Onychomycosis of toenail    Osteopenia    Osteoporosis    PUD (peptic ulcer disease) 1980's   Renal cyst    4.5-6 cm stable single  seen by uro in past   Past Surgical History:  Procedure Laterality Date   CHOLECYSTECTOMY  1987   COLONOSCOPY     IRRIGATION AND DEBRIDEMENT SEBACEOUS CYST Right 3/ 2014   chest wall   KNEE ARTHROSCOPY WITH MENISCAL REPAIR Left 07/30/2022   Procedure: LEFT KNEE ARTHROSCOPY WITH MEDIAL MENISCAL ROOT REPAIR;  Surgeon: Vanetta Mulders, MD;  Location: [facility];  Service: Orthopedics;  Laterality: Left;   UPPER GASTROINTESTINAL ENDOSCOPY     WISDOM TOOTH EXTRACTION  age 89 & 85   lower teeth done first and upper wisdom teeth last   Social History   Socioeconomic History   Marital status: Divorced    Spouse name: Not on file   Number of children: 0   Years of education: Not on file   Highest education level: Not on file  Occupational History   Occupation: TEACHER    Employer: [employer]  Tobacco Use   Smoking status: Never   Smokeless tobacco: Never  Vaping Use   Vaping Use: Never used  Substance and Sexual Activity    Alcohol use: No   Drug use: No   Sexual activity: Not on file  Other Topics Concern   Not on file  Social History Narrative   Teacher media specialist graduate degree Oncologist.   Non smoker    Hh of 1 ... 2 cats    Single   G0P0   Mom is now in assisted living and she has moved into her old residence   Social Determinants of Health   Financial Resource Strain: Low Risk  (04/25/2022)   Overall Financial Resource Strain (CARDIA)    Difficulty of Paying Living Expenses: Not hard at all  Food Insecurity: No Food Insecurity (04/25/2022)   Hunger Vital Sign    Worried About Running Out of Food in the Last Year: Never true    [place] in the Last Year: Never true  Transportation Needs: No Transportation Needs (04/25/2022)   PRAPARE - Hydrologist (Medical): No    Lack of Transportation (Non-Medical): No  Physical Activity: Insufficiently Active (04/25/2022)   Exercise Vital Sign    Days of Exercise per Week: 3 days  Minutes of Exercise per Session: 30 min  Stress: Stress Concern Present (04/25/2022)   [place]    Feeling of Stress : To some extent  Social Connections: Moderately Integrated (04/25/2022)   Social Connection and Isolation Panel [NHANES]    Frequency of Communication with Friends and Family: More than three times a week    Frequency of Social Gatherings with Friends and Family: More than three times a week    Attends Religious Services: More than 4 times per year    Active Member of [employer] or Organizations: Yes    Attends Music therapist: More than 4 times per year    Marital Status: Divorced   Family History  Problem Relation Age of Onset   Osteoarthritis Mother    COPD Mother    Pneumonia Mother    Osteoporosis Mother    Rheum arthritis Father    Pneumonia Father        deceased   Colon cancer Father 81   Heart attack Father    Breast  cancer Sister 44   Colon polyps Sister    Cancer Maternal Grandmother        ? GI   Heart failure Maternal Grandfather    Breast cancer Paternal Grandmother 83   Heart failure Paternal Grandfather    Esophageal cancer Neg Hx    Rectal cancer Neg Hx    Stomach cancer Neg Hx    Allergies  Allergen Reactions   Fosamax [Alendronate Sodium] Other (See Comments)    Intense pain   Tylenol [Acetaminophen] Other (See Comments)    "Irritates my esophagus"   Current Outpatient Medications  Medication Sig Dispense Refill   aspirin EC 325 MG tablet Take 1 tablet (325 mg total) by mouth daily. 30 tablet 0   Calcium Carb-Cholecalciferol (CALCIUM 600+D3 PO) Take 1 tablet by mouth daily.     cetirizine (ZYRTEC ALLERGY) 10 MG tablet Take 1 tablet (10 mg total) by mouth at bedtime. 90 tablet 1   denosumab (PROLIA) 60 MG/ML SOSY injection Inject 60 mg into the skin every 6 (six) months.     famotidine (PEPCID) 40 MG tablet Take 1 tablet (40 mg total) by mouth 2 (two) times daily. 60 tablet 1   FLUoxetine (PROZAC) 10 MG tablet Take 5 mg by mouth daily.     FLUoxetine (PROZAC) 40 MG capsule Take 40 mg by mouth daily.     fluticasone (FLONASE) 50 MCG/ACT nasal spray Place 1 spray into both nostrils daily. 47.4 mL 1   ipratropium (ATROVENT) 0.06 % nasal spray Place 2 sprays into both nostrils 3 (three) times daily. As needed for nasal congestion, runny nose 15 mL 1   Multiple Vitamins-Minerals (CENTRUM ULTRA WOMENS PO) Take 1 tablet by mouth every other day.     oxyCODONE (ROXICODONE) 5 MG immediate release tablet Take 1 tablet (5 mg total) by mouth every 4 (four) hours as needed for severe pain or breakthrough pain. 10 tablet 0   Probiotic Product (PROBIOTIC PO) Take by mouth daily.     No current facility-administered medications for this visit.   No results found.  Review of Systems:   A ROS was performed including pertinent positives and negatives as documented in the HPI.   Musculoskeletal  Exam:    Left knee range of motion is from 0 to 45 degrees.  Incisions are well-appearing.  No erythema or drainage.  Distal neurosensory exam is intact.  Trace swelling  about the knee  Imaging:      I personally reviewed and interpreted the radiographs.   Assessment:   2 weeks status post left knee medial meniscal root repair overall doing extremely well.  Today's visit I will plan to see her back in 4 weeks for reassessment.  At this time she will advance her weightbearing.  She will follow meniscal repair protocol  Plan :    -Return to clinic in 4 weeks for reassessment      I personally saw and evaluated the patient, and participated in the management and treatment plan.  Vanetta Mulders, MD Attending Physician, Orthopedic Surgery  This document was dictated using Dragon voice recognition software. A reasonable attempt at proof reading has been made to minimize errors.

## 2022-08-13 ENCOUNTER — Other Ambulatory Visit: Payer: Self-pay | Admitting: Family

## 2022-08-13 ENCOUNTER — Encounter: Payer: Self-pay | Admitting: Internal Medicine

## 2022-08-13 MED ORDER — MOXIFLOXACIN HCL 400 MG PO TABS: 400.0000 mg | ORAL_TABLET | Freq: Every day | ORAL | 0 refills | Status: AC

## 2022-08-14 ENCOUNTER — Ambulatory Visit: Payer: Medicare PPO | Admitting: Obstetrics and Gynecology

## 2022-08-14 ENCOUNTER — Encounter (HOSPITAL_BASED_OUTPATIENT_CLINIC_OR_DEPARTMENT_OTHER): Payer: Self-pay | Admitting: Orthopaedic Surgery

## 2022-08-16 ENCOUNTER — Ambulatory Visit (HOSPITAL_BASED_OUTPATIENT_CLINIC_OR_DEPARTMENT_OTHER): Payer: Medicare PPO | Admitting: Physical Therapy

## 2022-08-16 DIAGNOSIS — M25662 Stiffness of left knee, not elsewhere classified: Secondary | ICD-10-CM | POA: Diagnosis not present

## 2022-08-16 DIAGNOSIS — R262 Difficulty in walking, not elsewhere classified: Secondary | ICD-10-CM | POA: Diagnosis not present

## 2022-08-16 DIAGNOSIS — Z4789 Encounter for other orthopedic aftercare: Secondary | ICD-10-CM | POA: Diagnosis not present

## 2022-08-16 DIAGNOSIS — M6281 Muscle weakness (generalized): Secondary | ICD-10-CM

## 2022-08-16 DIAGNOSIS — M25562 Pain in left knee: Secondary | ICD-10-CM

## 2022-08-16 DIAGNOSIS — Z9889 Other specified postprocedural states: Secondary | ICD-10-CM | POA: Diagnosis not present

## 2022-08-16 NOTE — Therapy (Signed)
OUTPATIENT PHYSICAL THERAPY TREATMENT NOTE   Patient Name: [name] [name] MRN: [id number] DOB:[birth year], 67 y.o., female Today's Date: 08/17/2022  END OF SESSION:  PT End of Session - 08/17/22 2126     Visit Number 3    Number of Visits 28    Date for PT Re-Evaluation 10/25/22    Authorization Type [employer] MCR    PT Start Time 1450    PT Stop Time 1526    PT Time Calculation (min) 36 min    Activity Tolerance Patient tolerated treatment well    Behavior During Therapy WFL for tasks assessed/performed               Past Medical History:  Diagnosis Date   Arthritis    Bunion    left foot   Depression    stable on medication   Diverticulitis 2015   Diverticulitis of colon without hemorrhage 09/28/2013   Diverticulosis    Gallstones    GERD (gastroesophageal reflux disease)    Hammer toes of both feet    Hyperlipidemia    no meds needed   IBS (irritable bowel syndrome)    Meniscal injury    Tear   Onychomycosis of toenail    Osteopenia    Osteoporosis    PUD (peptic ulcer disease) 1980's   Renal cyst    4.5-6 cm stable single  seen by uro in past   Past Surgical History:  Procedure Laterality Date   [place] CYST Right 3/ 2014   chest wall   KNEE ARTHROSCOPY WITH MENISCAL REPAIR Left 07/30/2022   Procedure: LEFT KNEE ARTHROSCOPY WITH MEDIAL MENISCAL ROOT REPAIR;  Surgeon: Vanetta Mulders, MD;  Location: [facility];  Service: Orthopedics;  Laterality: Left;   UPPER GASTROINTESTINAL ENDOSCOPY     WISDOM TOOTH EXTRACTION  age 80 & 58   lower teeth done first and upper wisdom teeth last   Patient Active Problem List   Diagnosis Date Noted   Acute medial meniscus tear of left knee 07/30/2022   Elevated alkaline phosphatase measurement 09/26/2014   Renal cyst, left 12/31/2013   Osteopenia 12/31/2013   Vitamin D deficiency 12/31/2013   Postmenopausal atrophic vaginitis  12/31/2013   Family history of colon cancer 10/02/2013   Wrist pain, right 05/13/2013   Hx of fall 05/13/2013   Hypercholesteremia 05/13/2013   History of kidney stones    Musculoskeletal leg pain 02/02/2013   Gait abnormality 02/02/2013   Chest discomfort 05/28/2011   Left wrist injury 12/01/2010   Renal cyst 12/01/2010   Cough 09/27/2010   Neck pain 08/29/2010   Tingling sensation 08/29/2010   Allergic rhinitis, seasonal 08/29/2010   LEG PAIN, RIGHT 07/01/2009   VARICOSE VEINS LOWER EXTREMITIES W/OTH COMPS 12/06/2008   BACK PAIN, THORACIC REGION 12/06/2008   BACK PAIN 11/10/2008   BRACHIAL NEURITIS OR RADICULITIS NOS 10/29/2008   Irritable bowel syndrome 04/23/2007   HYPERLIPIDEMIA NEC/NOS 01/10/2007   DEPRESSION 01/08/2007   GERD 01/08/2007   Diverticulosis of colon (without mention of hemorrhage) 07/28/2002     REFERRING PROVIDER: Vanetta Mulders, MD   REFERRING DIAG: M23.92 (ICD-10-CM) - Acute internal derangement of left knee     S/p L knee medial meniscal root repair  THERAPY DIAG:  Left knee pain, unspecified chronicity  Stiffness of left knee, not elsewhere classified  Muscle weakness (generalized)  Difficulty in walking, not elsewhere classified  Rationale for Evaluation and Treatment:  Rehabilitation  ONSET DATE: DOS 07/30/2022  SUBJECTIVE:   SUBJECTIVE STATEMENT: Pt is 2 weeks and 3 days s/p left knee medial meniscal root repair.  Pt saw MD last Friday and he was pleased with progress.  Pt states MD informed her she could start to lightly increase Wb'ing.  MD informed pt she does not need to sleep in brace.  Pt is still sleeping in brace because she gets up in the middle of the night to use the restroom.  Pt still needs to use the brace.  Pt has been putting more weight on L LE with gait without increased pain. Pt reports compliance with HEP and states her knee flexion AAROM is the hardest to do.    Pt is limited with all of her normal functional mobility  skills including ambulation and transfers.  Pt is limited with standing.  Pt is unable to perform volunteer work at [employer].    PERTINENT HISTORY: -Status post left knee medial meniscal root repair 07/30/2022.  [place] for a total of 2 weeks with brace locked in extension. -MRI findings of L knee moderate medial compartment deg changes -Pt has a Hx of Baker's cyst.   -Osteoporosis, OA, and lumbar pain  PAIN:  Are you having pain? Yes NPRS:  1/10 "almost 0" current, 3/10 worst, 1/10 best Location:  L knee  PRECAUTIONS: Other: per surgical protocol and Wb'ing restrictions  WEIGHT BEARING RESTRICTIONS: Yes NWB x 2 weeks with brace locked in extension  FALLS:  Has patient fallen in last 6 months? No  LIVING ENVIRONMENT: Lives with: lives alone Lives in: 1 story home Stairs: ramp to enter home.  Pt has 1 step in home with a rail.  Has following equipment at home:  [place].  Sister has a cane.  OCCUPATION: Pt is retired  PLOF: Independent.  Pt was able to perform her normal functional mobility skills and daily activities independently without difficulty.  Pt ambulated without an AD.  Pt able to perform volunteer work at [employer] which involved a lot of standing.    PATIENT GOALS: return to PLOF   OBJECTIVE:   DIAGNOSTIC FINDINGS: Pt is post op.  MRI before surgery also showed mod medial compartment deg changes with mild patellofemoral and lateral compartment deg chondrosis and a tiny Baker's cyst.    TODAY'S TREATMENT:    PATIENT SURVEYS:  FOTO 57 with a goal of 73 at visit #12     OBSERVATION:  L knee in KI locked in extension.  Incision and portals intact without stitches.  Portals are clean and dry with no drainage. Steri strips over incisions and portals.         LOWER EXTREMITY ROM:  AROM Right eval Left eval Left 3/21 Left 3/28  Hip flexion      Hip extension      Hip abduction      Hip adduction      Hip internal rotation      Hip external rotation       Knee flexion 118  AAROM:  41 deg  AAROM:  62 deg   Knee extension 0 WFL AROM/PROM:  2/0   Ankle dorsiflexion      Ankle plantarflexion      Ankle inversion      Ankle eversion       (Blank rows = not tested)  LOWER EXTREMITY MMT:   Pt performed: Quad sets with 5 sec hold x 15 reps Supine SLR 3x10 reps  Longsitting  knee flexion AAROM with hands under knee x 10 reps Supine knee flexion AAROM with strap x 10 reps PF with GTB 3x10  Pt received L knee flexion and extension PROM per pt/tissue tolerance within protocol range seated at EOT.    PT reviewed HEP and updated HEP.  Pt received a HEP handout and was educated in correct form and appropriate frequency.  Pt instructed in appropriate ROM including to not perform flexion AAROM into a painful or tight range.  Gait:  Pt ambulated with increased Wb'ing thru Ue's on walker and light Wb'ing thru L LE.  PT educated pt in correct sequencing of gait with walker and appropriate Wb'ing per MD instructions.                                                                                                PATIENT EDUCATION:  Education details: Educated pt on steri strips.  Proper brace fit.  gait.  Post op and protocol restrictions and MD orders.  relevant anatomy, HEP, and POC.  PT answered pt's questions.  Updated HEP and gave pt a HEP handout.  Instructed pt in appropriate ROM per protocol.   Person educated: Patient and sister Education method: Explanation, Demonstration, Tactile cues, Verbal cues, and Handouts Education comprehension: verbalized understanding, returned demonstration, verbal cues required, tactile cues required, and needs further education  HOME EXERCISE PROGRAM: Access Code: [email or web address] URL: [email or web address] Date: 08/02/2022 Prepared by: Ronny Flurry  Exercises - Supine Quadricep Sets  - 2 x daily - 7 x weekly - 2 sets - 10 reps - 5 seconds hold - ANKLE PUMPS   20-30 every hour  Supine Active Straight Leg  Raise  - 1 x daily - 7 x weekly - 2 sets - 10 reps - Sitting Heel Slide with hands under thigh  - 2 x daily - 7 x weekly - 2 sets - 10 reps  Updated HEP: - Supine Heel Slide with Strap  - 2 x daily - 7 x weekly - 2 sets - 10 reps - Long Sitting Ankle Plantar Flexion with Resistance  - 1 x daily - 7 x weekly - 2-3 sets - 10 reps  ASSESSMENT:  CLINICAL IMPRESSION: Pt is ambulating with FWW with KI locked in extension.  MD note indicated to advance [place] and Pt states MD instructed her to lightly increase Wb'ing in L LE.  Pt ambulated with light Wb'ing thru L LE well with FWW without pain.  Pt is compliant with HEP.  She required cuing for correct form with quad sets including tactile cuing.  Pt demonstrates improved performance of quad sets with cuing.  Pt performs supine SLR well independently without brace.  She demonstrates improved L knee flexion ROM.  Pt required cuing to relax L LE and decrease guarding with knee PROM.  PT updated HEP and gave pt a HEP handout.  Pt demonstrates good understanding of HEP and protocol restrictions.  Pt responded well to Rx having no pain after Rx.  Pt should benefit from continued skilled PT services per protocol to address goals and impairments and to improve overall function.  OBJECTIVE IMPAIRMENTS: Abnormal gait, decreased activity tolerance, decreased endurance, decreased knowledge of use of DME, decreased mobility, difficulty walking, decreased ROM, decreased strength, hypomobility, increased edema, impaired flexibility, and pain.   ACTIVITY LIMITATIONS: bending, standing, squatting, stairs, transfers, bed mobility, dressing, and locomotion level  PARTICIPATION LIMITATIONS: meal prep, cleaning, laundry, driving, shopping, community activity, and volunteer work  PERSONAL FACTORS: 3+ comorbidities: moderate deg changes, osteoporosis, and lumbar pain  are also affecting patient's functional outcome.   REHAB POTENTIAL: Good  CLINICAL DECISION MAKING:  Stable/uncomplicated  EVALUATION COMPLEXITY: Low   GOALS:  SHORT TERM GOALS: Target date: 08/30/2022  Pt will be independent and compliant with HEP for improved pain, ROM, strength, and function.  Baseline: Goal status: INITIAL  2.  Pt will progress with [place] per MD orders without adverse effects for improved mobility.  Baseline:  Goal status: INITIAL  3.  Pt will demo L knee flexion PROM to be 90 deg for improved stiffness and mobility.  Baseline:  Goal status: INITIAL  4.  Pt will demo a good quad set and perform a supine SLR independently without significant extensor lag for improved strength.  Baseline:  Goal status: INITIAL  5.  Pt will be able to perform a 6 inch step up with good form and control.  Baseline:  Goal status: INITIAL Target date:  10/11/2022  6.  Pt will ambulate with a normalized heel to toe gait pattern without AD.  Baseline:  Goal status: INITIAL Target date:  10/25/2022  7.  Pt will demo L knee AROM to 0 - 115 deg for improved mobility and stiffness.  Baseline:  Goal status: INITIAL Target date:  10/25/2022   LONG TERM GOALS: Target date: 11/22/2022   Pt will ambulate extended community distance without difficulty and significant pain.  Baseline:  Goal status: INITIAL  2.  Pt will be able to perform stairs with a reciprocal gait with the rail.   Baseline:  Goal status: INITIAL  3.  Pt will be able to perform her ADLs and IADLs without significant pain and difficulty.  Baseline:  Goal status: INITIAL  4.  Pt will be able to perform her volunteer work without adverse effects.  Baseline:  Goal status: INITIAL  5.  Pt will demo 5/5 strength in L hip flex and abd and at least 4+/5 strength in L knee flex and extension for improved performance of and tolerance with functional mobility.  Baseline:  Goal status: INITIAL     PLAN:  PT FREQUENCY:  1x/wk x 4 weeks and 2x/wk afterwards  PT DURATION: other: 16 weeks  PLANNED INTERVENTIONS:  Therapeutic exercises, Therapeutic activity, Neuromuscular re-education, Balance training, Gait training, Patient/Family education, Self Care, Joint mobilization, Stair training, Aquatic Therapy, Dry Needling, Electrical stimulation, Cryotherapy, Moist heat, scar mobilization, Taping, Ultrasound, Manual therapy, and Re-evaluation  PLAN FOR NEXT SESSION:  Review and perform HEP.  Cont per Dr. Eddie Dibbles meniscus repair protocol.  MD note indicated to advance her weightbearing.   RTMD:  4/19   Selinda Michaels III PT, DPT 08/17/22 9:46 PM

## 2022-08-17 ENCOUNTER — Encounter (HOSPITAL_BASED_OUTPATIENT_CLINIC_OR_DEPARTMENT_OTHER): Payer: Self-pay | Admitting: Physical Therapy

## 2022-08-17 ENCOUNTER — Encounter: Payer: Self-pay | Admitting: Obstetrics and Gynecology

## 2022-08-17 DIAGNOSIS — N9089 Other specified noninflammatory disorders of vulva and perineum: Secondary | ICD-10-CM

## 2022-08-21 ENCOUNTER — Other Ambulatory Visit (HOSPITAL_BASED_OUTPATIENT_CLINIC_OR_DEPARTMENT_OTHER): Payer: Self-pay | Admitting: Orthopaedic Surgery

## 2022-08-21 MED ORDER — BETAMETHASONE VALERATE 0.1 % EX OINT: TOPICAL_OINTMENT | CUTANEOUS | 0 refills | Status: DC

## 2022-08-21 NOTE — Telephone Encounter (Signed)
Rx was sent to correct pharmacy by Dr. Talbert Nan. Message sent to patient letting her know it has been sent.

## 2022-08-21 NOTE — Telephone Encounter (Signed)
[name] [name], I think I sent her script to the wrong [facility]. Can you please call and fix it and let the patient know. Thank you.

## 2022-08-23 ENCOUNTER — Ambulatory Visit (HOSPITAL_BASED_OUTPATIENT_CLINIC_OR_DEPARTMENT_OTHER): Payer: Medicare PPO | Attending: Orthopaedic Surgery | Admitting: Physical Therapy

## 2022-08-23 ENCOUNTER — Encounter (HOSPITAL_BASED_OUTPATIENT_CLINIC_OR_DEPARTMENT_OTHER): Payer: Self-pay | Admitting: Physical Therapy

## 2022-08-23 DIAGNOSIS — M25662 Stiffness of left knee, not elsewhere classified: Secondary | ICD-10-CM | POA: Diagnosis not present

## 2022-08-23 DIAGNOSIS — R262 Difficulty in walking, not elsewhere classified: Secondary | ICD-10-CM | POA: Insufficient documentation

## 2022-08-23 DIAGNOSIS — M25562 Pain in left knee: Secondary | ICD-10-CM | POA: Insufficient documentation

## 2022-08-23 DIAGNOSIS — M6281 Muscle weakness (generalized): Secondary | ICD-10-CM | POA: Insufficient documentation

## 2022-08-23 NOTE — Therapy (Signed)
OUTPATIENT PHYSICAL THERAPY TREATMENT NOTE   Patient Name: [name] [name] MRN: [id number] DOB:1955-11-29, 67 y.o., female Today's Date: 08/23/2022  END OF SESSION:  PT End of Session - 08/23/22 1459     Visit Number 4    Number of Visits 28    Date for PT Re-Evaluation 10/25/22    Authorization Type [employer] MCR    PT Start Time [id number]    PT Stop Time [id number]    PT Time Calculation (min) 42 min    Activity Tolerance Patient tolerated treatment well    Behavior During Therapy WFL for tasks assessed/performed               Past Medical History:  Diagnosis Date   Arthritis    Bunion    left foot   Depression    stable on medication   Diverticulitis 2015   Diverticulitis of colon without hemorrhage 09/28/2013   Diverticulosis    Gallstones    GERD (gastroesophageal reflux disease)    Hammer toes of both feet    Hyperlipidemia    no meds needed   IBS (irritable bowel syndrome)    Meniscal injury    Tear   Onychomycosis of toenail    Osteopenia    Osteoporosis    PUD (peptic ulcer disease) 1980's   Renal cyst    4.5-6 cm stable single  seen by uro in past   Past Surgical History:  Procedure Laterality Date   [place] CYST Right 3/ 2014   chest wall   KNEE ARTHROSCOPY WITH MENISCAL REPAIR Left 07/30/2022   Procedure: LEFT KNEE ARTHROSCOPY WITH MEDIAL MENISCAL ROOT REPAIR;  Surgeon: Vanetta Mulders, MD;  Location: [facility];  Service: Orthopedics;  Laterality: Left;   UPPER GASTROINTESTINAL ENDOSCOPY     WISDOM TOOTH EXTRACTION  age 71 & 70   lower teeth done first and upper wisdom teeth last   Patient Active Problem List   Diagnosis Date Noted   Acute medial meniscus tear of left knee 07/30/2022   Elevated alkaline phosphatase measurement 09/26/2014   Renal cyst, left 12/31/2013   Osteopenia 12/31/2013   Vitamin D deficiency 12/31/2013   Postmenopausal atrophic vaginitis  12/31/2013   Family history of colon cancer 10/02/2013   Wrist pain, right 05/13/2013   Hx of fall 05/13/2013   Hypercholesteremia 05/13/2013   History of kidney stones    Musculoskeletal leg pain 02/02/2013   Gait abnormality 02/02/2013   Chest discomfort 05/28/2011   Left wrist injury 12/01/2010   Renal cyst 12/01/2010   Cough 09/27/2010   Neck pain 08/29/2010   Tingling sensation 08/29/2010   Allergic rhinitis, seasonal 08/29/2010   LEG PAIN, RIGHT 07/01/2009   VARICOSE VEINS LOWER EXTREMITIES W/OTH COMPS 12/06/2008   BACK PAIN, THORACIC REGION 12/06/2008   BACK PAIN 11/10/2008   BRACHIAL NEURITIS OR RADICULITIS NOS 10/29/2008   Irritable bowel syndrome 04/23/2007   HYPERLIPIDEMIA NEC/NOS 01/10/2007   DEPRESSION 01/08/2007   GERD 01/08/2007   Diverticulosis of colon (without mention of hemorrhage) 07/28/2002     REFERRING PROVIDER: Vanetta Mulders, MD   REFERRING DIAG: M23.92 (ICD-10-CM) - Acute internal derangement of left knee     S/p L knee medial meniscal root repair  THERAPY DIAG:  Left knee pain, unspecified chronicity  Stiffness of left knee, not elsewhere classified  Muscle weakness (generalized)  Difficulty in walking, not elsewhere classified  Rationale for Evaluation and Treatment:  Rehabilitation  ONSET DATE: DOS 07/30/2022  SUBJECTIVE:   SUBJECTIVE STATEMENT: Pt is 3 weeks and 3 days s/p left knee medial meniscal root repair.  Pt denies any adverse effects after prior Rx and she didn't use any ice after prior Rx.   Pt states she has a 4/10 pain at night sometimes depending how she moves in the bed.  Pt is increasing her Wb'ing without increased pain.  Pt states she has been performing her home exercises, but only been performing knee flexion ROM once per day.  She has not been performing knee flexion AAROM with the towel/strap, but her has been performing with her hands.    PERTINENT HISTORY: -Status post left knee medial meniscal root repair  07/30/2022.  [place] for a total of 2 weeks with brace locked in extension. -MRI findings of L knee moderate medial compartment deg changes -Pt has a Hx of Baker's cyst.   -Osteoporosis, OA, and lumbar pain  PAIN:  Are you having pain? Yes NPRS:  1/10 current, 4/10 worst, 0/10 best Location:  L knee  PRECAUTIONS: Other: per surgical protocol and Wb'ing restrictions  WEIGHT BEARING RESTRICTIONS: Yes NWB x 2 weeks with brace locked in extension  FALLS:  Has patient fallen in last 6 months? No  LIVING ENVIRONMENT: Lives with: lives alone Lives in: 1 story home Stairs: ramp to enter home.  Pt has 1 step in home with a rail.  Has following equipment at home:  [place].  Sister has a cane.  OCCUPATION: Pt is retired  PLOF: Independent.  Pt was able to perform her normal functional mobility skills and daily activities independently without difficulty.  Pt ambulated without an AD.  Pt able to perform volunteer work at [employer] which involved a lot of standing.    PATIENT GOALS: return to PLOF   OBJECTIVE:   DIAGNOSTIC FINDINGS: Pt is post op.  MRI before surgery also showed mod medial compartment deg changes with mild patellofemoral and lateral compartment deg chondrosis and a tiny Baker's cyst.    TODAY'S TREATMENT:    LOWER EXTREMITY ROM:  AROM Right eval Left eval Left 3/21 Left 3/28 Left 4/4  Hip flexion       Hip extension       Hip abduction       Hip adduction       Hip internal rotation       Hip external rotation       Knee flexion 118  AAROM:  41 deg  AAROM:  62 deg  53 to 67 deg AROM / 74 deg PROM  Knee extension 0 WFL AROM/PROM:  2/0  0  Ankle dorsiflexion       Ankle plantarflexion       Ankle inversion       Ankle eversion        (Blank rows = not tested)     -Pt performed: Quad sets with 5 sec hold x 12 reps Supine SLR 3x10 reps     -Pt performs supine SLR without extensor lag. Longsitting knee flexion AAROM with hands under knee x 10 reps Supine  knee flexion AAROM with strap 2 x 10 reps S/L hip abd 2x10 S/L hip add 2x10  -Pt received L knee flexion and extension PROM per pt/tissue tolerance within protocol range seated at EOT.    -PT reviewed HEP and updated HEP.  Pt received a HEP handout and was educated in correct form and appropriate frequency.  Pt instructed in  appropriate ROM. PT instructed to increase frequency of supine heel slides with strap.  -See below for pt education                                                                                             PATIENT EDUCATION:  Education details:  PT updated HEP and gave pt a HEP handout.  Educated pt in correct frequency and appropriate ROM.  Educated pt on steri strips.  Post op and protocol restrictions and MD orders.  relevant anatomy and POC.  PT answered pt's questions.   Person educated: Patient and sister Education method: Explanation, Demonstration, Tactile cues, Verbal cues, and Handouts Education comprehension: verbalized understanding, returned demonstration, verbal cues required, tactile cues required, and needs further education  HOME EXERCISE PROGRAM: Access Code: [email or web address] URL: [email or web address] Date: 08/02/2022 Prepared by: Ronny Flurry  Exercises - Supine Quadricep Sets  - 2 x daily - 7 x weekly - 2 sets - 10 reps - 5 seconds hold - ANKLE PUMPS   20-30 every hour  Supine Active Straight Leg Raise  - 1 x daily - 7 x weekly - 2 sets - 10 reps - Sitting Heel Slide with hands under thigh  - 2 x daily - 7 x weekly - 2 sets - 10 reps - Supine Heel Slide with Strap  - 2 x daily - 7 x weekly - 2 sets - 10 reps - Long Sitting Ankle Plantar Flexion with Resistance  - 1 x daily - 7 x weekly - 2-3 sets - 10 reps  Updated HEP: - Sidelying Hip Abduction  - 1 x daily - 7 x weekly - 2 sets - 10 reps - Sidelying Hip Adduction  - 1 x daily - 7 x weekly - 2 sets - 10 reps  ASSESSMENT:  CLINICAL IMPRESSION: Pt is progressing well with  strength, gait, and protocol.  Pt is increasing Wb'ing with gait without adverse effects.  Pt demonstrates improved quad activation/strength as evidenced by performing supine SLR without extensor lag.  Pt demonstrates improved extension AROM to 0 deg.  Pt has tightness and limitations in knee flexion ROM, but did demonstrate improved flexion PROM.  Pt has only been performing knee flexion ROM 1x/day.  PT instructed pt in performing 2-3x/day to improve ROM and explained to pt the importance of performing more than once per day.  Pt's flexion AAROM began at 53 deg today and improved to 67 deg after PROM and AAROM.  Progressed exercises per protocol and pt performed exercises well without pain.  PT updated HEP today and pt demonstrates good understanding.  She also demonstrates good understanding of increasing frequency of knee flexion AAROM home exercise.  Pt responded well to Rx having no pain after Rx.  Pt should benefit from continued skilled PT services per protocol to address goals and impairments and to improve overall function.    OBJECTIVE IMPAIRMENTS: Abnormal gait, decreased activity tolerance, decreased endurance, decreased knowledge of use of DME, decreased mobility, difficulty walking, decreased ROM, decreased strength, hypomobility, increased edema, impaired flexibility, and pain.   ACTIVITY LIMITATIONS: bending, standing, squatting, stairs, transfers, bed mobility, dressing,  and locomotion level  PARTICIPATION LIMITATIONS: meal prep, cleaning, laundry, driving, shopping, community activity, and volunteer work  PERSONAL FACTORS: 3+ comorbidities: moderate deg changes, osteoporosis, and lumbar pain  are also affecting patient's functional outcome.   REHAB POTENTIAL: Good  CLINICAL DECISION MAKING: Stable/uncomplicated  EVALUATION COMPLEXITY: Low   GOALS:  SHORT TERM GOALS: Target date: 08/30/2022  Pt will be independent and compliant with HEP for improved pain, ROM, strength, and  function.  Baseline: Goal status: INITIAL  2.  Pt will progress with [place] per MD orders without adverse effects for improved mobility.  Baseline:  Goal status: INITIAL  3.  Pt will demo L knee flexion PROM to be 90 deg for improved stiffness and mobility.  Baseline:  Goal status: INITIAL  4.  Pt will demo a good quad set and perform a supine SLR independently without significant extensor lag for improved strength.  Baseline:  Goal status: INITIAL  5.  Pt will be able to perform a 6 inch step up with good form and control.  Baseline:  Goal status: INITIAL Target date:  10/11/2022  6.  Pt will ambulate with a normalized heel to toe gait pattern without AD.  Baseline:  Goal status: INITIAL Target date:  10/25/2022  7.  Pt will demo L knee AROM to 0 - 115 deg for improved mobility and stiffness.  Baseline:  Goal status: INITIAL Target date:  10/25/2022   LONG TERM GOALS: Target date: 11/22/2022   Pt will ambulate extended community distance without difficulty and significant pain.  Baseline:  Goal status: INITIAL  2.  Pt will be able to perform stairs with a reciprocal gait with the rail.   Baseline:  Goal status: INITIAL  3.  Pt will be able to perform her ADLs and IADLs without significant pain and difficulty.  Baseline:  Goal status: INITIAL  4.  Pt will be able to perform her volunteer work without adverse effects.  Baseline:  Goal status: INITIAL  5.  Pt will demo 5/5 strength in L hip flex and abd and at least 4+/5 strength in L knee flex and extension for improved performance of and tolerance with functional mobility.  Baseline:  Goal status: INITIAL     PLAN:  PT FREQUENCY:  1x/wk x 4 weeks and 2x/wk afterwards  PT DURATION: other: 16 weeks  PLANNED INTERVENTIONS: Therapeutic exercises, Therapeutic activity, Neuromuscular re-education, Balance training, Gait training, Patient/Family education, Self Care, Joint mobilization, Stair training, Aquatic  Therapy, Dry Needling, Electrical stimulation, Cryotherapy, Moist heat, scar mobilization, Taping, Ultrasound, Manual therapy, and Re-evaluation  PLAN FOR NEXT SESSION:  Review and perform HEP.  Cont per Dr. Eddie Dibbles meniscus repair protocol.  MD note indicated to advance her weightbearing.   RTMD:  4/19.  Will send MD a message concerning unlocking her brace.    Selinda Michaels III PT, DPT 08/23/22 4:58 PM

## 2022-08-26 NOTE — Therapy (Signed)
OUTPATIENT PHYSICAL THERAPY TREATMENT NOTE   Patient Name: JAHLISA ROSSITTO MRN: [id number] DOB:05/26/1955, 67 y.o., female Today's Date: 08/27/2022  END OF SESSION:  PT End of Session - 08/27/22 1459     Visit Number 5    Number of Visits 28    Date for PT Re-Evaluation 10/25/22    Authorization Type [employer] MCR    PT Start Time 1455    PT Stop Time 1537    PT Time Calculation (min) 42 min    Activity Tolerance Patient tolerated treatment well    Behavior During Therapy WFL for tasks assessed/performed                Past Medical History:  Diagnosis Date   Arthritis    Bunion    left foot   Depression    stable on medication   Diverticulitis 2015   Diverticulitis of colon without hemorrhage 09/28/2013   Diverticulosis    Gallstones    GERD (gastroesophageal reflux disease)    Hammer toes of both feet    Hyperlipidemia    no meds needed   IBS (irritable bowel syndrome)    Meniscal injury    Tear   Onychomycosis of toenail    Osteopenia    Osteoporosis    PUD (peptic ulcer disease) 1980's   Renal cyst    4.5-6 cm stable single  seen by uro in past   Past Surgical History:  Procedure Laterality Date   CHOLECYSTECTOMY  1987   COLONOSCOPY     IRRIGATION AND DEBRIDEMENT SEBACEOUS CYST Right 3/ 2014   chest wall   KNEE ARTHROSCOPY WITH MENISCAL REPAIR Left 07/30/2022   Procedure: LEFT KNEE ARTHROSCOPY WITH MEDIAL MENISCAL ROOT REPAIR;  Surgeon: Huel Cote, MD;  Location: [facility];  Service: Orthopedics;  Laterality: Left;   UPPER GASTROINTESTINAL ENDOSCOPY     WISDOM TOOTH EXTRACTION  age 67 & 2   lower teeth done first and upper wisdom teeth last   Patient Active Problem List   Diagnosis Date Noted   Acute medial meniscus tear of left knee 07/30/2022   Elevated alkaline phosphatase measurement 09/26/2014   Renal cyst, left 12/31/2013   Osteopenia 12/31/2013   Vitamin D deficiency 12/31/2013   Postmenopausal atrophic vaginitis  12/31/2013   Family history of colon cancer 10/02/2013   Wrist pain, right 05/13/2013   Hx of fall 05/13/2013   Hypercholesteremia 05/13/2013   History of kidney stones    Musculoskeletal leg pain 02/02/2013   Gait abnormality 02/02/2013   Chest discomfort 05/28/2011   Left wrist injury 12/01/2010   Renal cyst 12/01/2010   Cough 09/27/2010   Neck pain 08/29/2010   Tingling sensation 08/29/2010   Allergic rhinitis, seasonal 08/29/2010   LEG PAIN, RIGHT 07/01/2009   VARICOSE VEINS LOWER EXTREMITIES W/OTH COMPS 12/06/2008   BACK PAIN, THORACIC REGION 12/06/2008   BACK PAIN 11/10/2008   BRACHIAL NEURITIS OR RADICULITIS NOS 10/29/2008   Irritable bowel syndrome 04/23/2007   HYPERLIPIDEMIA NEC/NOS 01/10/2007   DEPRESSION 01/08/2007   GERD 01/08/2007   Diverticulosis of colon (without mention of hemorrhage) 07/28/2002     REFERRING PROVIDER: Huel Cote, MD   REFERRING DIAG: M23.92 (ICD-10-CM) - Acute internal derangement of left knee     S/p L knee medial meniscal root repair  THERAPY DIAG:  Left knee pain, unspecified chronicity  Stiffness of left knee, not elsewhere classified  Muscle weakness (generalized)  Difficulty in walking, not elsewhere classified  Rationale for Evaluation and  Treatment: Rehabilitation  ONSET DATE: DOS 07/30/2022  SUBJECTIVE:   SUBJECTIVE STATEMENT: Pt is 4 weeks s/p left knee medial meniscal root repair.  Pt denies any adverse effects after prior Rx.  Pt used ice after prior Rx.  Pt reports compliance with HEP and she has no adverse effects with HEP.  Pt has increased the frequency of supine heel slides with strap.  Pt is increasing her Wb'ing without increased pain.    PERTINENT HISTORY: -Status post left knee medial meniscal root repair 07/30/2022.  NWB'ing for a total of 2 weeks with brace locked in extension. -MRI findings of L knee moderate medial compartment deg changes -Pt has a Hx of Baker's cyst.   -Osteoporosis, OA, and lumbar  pain  PAIN:  Are you having pain? Yes NPRS:  0/10 current, 4/10 worst, 0/10 best Location:  L knee  PRECAUTIONS: Other: per surgical protocol and Wb'ing restrictions  WEIGHT BEARING RESTRICTIONS: Yes Advance WB'ing  FALLS:  Has patient fallen in last 6 months? No  LIVING ENVIRONMENT: Lives with: lives alone Lives in: 1 story home Stairs: ramp to enter home.  Pt has 1 step in home with a rail.  Has following equipment at home:  FWW.  Sister has a cane.  OCCUPATION: Pt is retired  PLOF: Independent.  Pt was able to perform her normal functional mobility skills and daily activities independently without difficulty.  Pt ambulated without an AD.  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    PATIENT GOALS: return to PLOF   OBJECTIVE:   DIAGNOSTIC FINDINGS: Pt is post op.  MRI before surgery also showed mod medial compartment deg changes with mild patellofemoral and lateral compartment deg chondrosis and a tiny Baker's cyst.    TODAY'S TREATMENT:    LOWER EXTREMITY ROM:  AROM Right eval Left eval Left 3/21 Left 3/28 Left 4/4 Left 4/8  Hip flexion        Hip extension        Hip abduction        Hip adduction        Hip internal rotation        Hip external rotation        Knee flexion 118  AAROM:  41 deg  AAROM:  62 deg  53 to 67 deg AROM / 74 deg PROM 77 deg AAROM ; 82 deg PROM  Knee extension 0 WFL AROM/PROM:  2/0  0 0  Ankle dorsiflexion        Ankle plantarflexion        Ankle inversion        Ankle eversion         (Blank rows = not tested)     -Pt performed: Quad sets with 5 sec hold x 10 reps Supine SLR 3x10 reps     -Pt performs supine SLR without extensor lag. Supine knee flexion AAROM with strap 2 x 10 reps S/L hip abd 2x10 S/L hip add 2x10 [place] hip extension 2x10 -Pt received L knee flexion and extension PROM per pt/tissue tolerance within protocol range seated at EOT and in supine.     Gait  Training: PT received message from Dr. Steward Drone that it was ok to unlock brace.  PT opened brace to 0-30 deg.  Pt ambulated in the clinic with FWW with brace at 0-30 deg without pain and without any c/o's.  PT demonstrated and instructed pt in proper gait.  -See below  for pt education                                                                                             PATIENT EDUCATION:  Education details: HEP, Exercise form, relevant anatomy, POC, gait, brace, post op and protocol restrictions, and MD orders.    Person educated: Patient and sister Education method: Explanation, Demonstration, Tactile cues, Verbal cues, and Handouts Education comprehension: verbalized understanding, returned demonstration, verbal cues required, tactile cues required, and needs further education  HOME EXERCISE PROGRAM: Access Code: [email or web address] URL: [email or web address] Date: 08/02/2022 Prepared by: Aaron Edelman  Exercises - Supine Quadricep Sets  - 2 x daily - 7 x weekly - 2 sets - 10 reps - 5 seconds hold - ANKLE PUMPS   20-30 every hour  Supine Active Straight Leg Raise  - 1 x daily - 7 x weekly - 2 sets - 10 reps - Sitting Heel Slide with hands under thigh  - 2 x daily - 7 x weekly - 2 sets - 10 reps - Supine Heel Slide with Strap  - 2 x daily - 7 x weekly - 2 sets - 10 reps - Long Sitting Ankle Plantar Flexion with Resistance  - 1 x daily - 7 x weekly - 2-3 sets - 10 reps - Sidelying Hip Abduction  - 1 x daily - 7 x weekly - 2 sets - 10 reps - Sidelying Hip Adduction  - 1 x daily - 7 x weekly - 2 sets - 10 reps  ASSESSMENT:  CLINICAL IMPRESSION: Pt continues to have full knee extension ROM.  She has increased her frequency of knee ROM exercise at home and presents to Rx with improved knee flexion ROM.  She has limitations with knee flexion ROM though demonstrates improved flexion AAROM and PROM today.       Pt continues to perform supine SLR without extensor lag.  She performed  exercises per protocol well with cuing for correct form. She is increasing Wb'ing with gait without adverse effects and demonstrates smoother gait without c/o's.  PT received a message from MD stating PT can unlock brace.  PT unlocked brace to 0-30 deg and pt ambulated well with FWW without pain and without c/o's.  Pt understood she should not have increased pain with brace unlocked 0-30 deg.  Pt responded well to Rx stating her knee feels good and and having no pain after Rx.  Pt should benefit from continued skilled PT services per protocol to address goals and impairments and to improve overall function.    OBJECTIVE IMPAIRMENTS: Abnormal gait, decreased activity tolerance, decreased endurance, decreased knowledge of use of DME, decreased mobility, difficulty walking, decreased ROM, decreased strength, hypomobility, increased edema, impaired flexibility, and pain.   ACTIVITY LIMITATIONS: bending, standing, squatting, stairs, transfers, bed mobility, dressing, and locomotion level  PARTICIPATION LIMITATIONS: meal prep, cleaning, laundry, driving, shopping, community activity, and volunteer work  PERSONAL FACTORS: 3+ comorbidities: moderate deg changes, osteoporosis, and lumbar pain  are also affecting patient's functional outcome.   REHAB POTENTIAL: Good  CLINICAL DECISION MAKING: Stable/uncomplicated  EVALUATION COMPLEXITY: Low   GOALS:  SHORT  TERM GOALS: Target date: 08/30/2022  Pt will be independent and compliant with HEP for improved pain, ROM, strength, and function.  Baseline: Goal status: INITIAL  2.  Pt will progress with Wb'ing per MD orders without adverse effects for improved mobility.  Baseline:  Goal status: INITIAL  3.  Pt will demo L knee flexion PROM to be 90 deg for improved stiffness and mobility.  Baseline:  Goal status: INITIAL  4.  Pt will demo a good quad set and perform a supine SLR independently without significant extensor lag for improved strength.   Baseline:  Goal status: INITIAL  5.  Pt will be able to perform a 6 inch step up with good form and control.  Baseline:  Goal status: INITIAL Target date:  10/11/2022  6.  Pt will ambulate with a normalized heel to toe gait pattern without AD.  Baseline:  Goal status: INITIAL Target date:  10/25/2022  7.  Pt will demo L knee AROM to 0 - 115 deg for improved mobility and stiffness.  Baseline:  Goal status: INITIAL Target date:  10/25/2022   LONG TERM GOALS: Target date: 11/22/2022   Pt will ambulate extended community distance without difficulty and significant pain.  Baseline:  Goal status: INITIAL  2.  Pt will be able to perform stairs with a reciprocal gait with the rail.   Baseline:  Goal status: INITIAL  3.  Pt will be able to perform her ADLs and IADLs without significant pain and difficulty.  Baseline:  Goal status: INITIAL  4.  Pt will be able to perform her volunteer work without adverse effects.  Baseline:  Goal status: INITIAL  5.  Pt will demo 5/5 strength in L hip flex and abd and at least 4+/5 strength in L knee flex and extension for improved performance of and tolerance with functional mobility.  Baseline:  Goal status: INITIAL     PLAN:  PT FREQUENCY:  1x/wk x 4 weeks and 2x/wk afterwards  PT DURATION: other: 16 weeks  PLANNED INTERVENTIONS: Therapeutic exercises, Therapeutic activity, Neuromuscular re-education, Balance training, Gait training, Patient/Family education, Self Care, Joint mobilization, Stair training, Aquatic Therapy, Dry Needling, Electrical stimulation, Cryotherapy, Moist heat, scar mobilization, Taping, Ultrasound, Manual therapy, and Re-evaluation  PLAN FOR NEXT SESSION:  Review and perform HEP.  Cont per Dr. Serena CroissantBokshan's meniscus repair protocol.  MD note indicated to advance her weightbearing.   RTMD:  4/19.      Audie Clearoby Carlean Crowl III PT, DPT 08/27/22 8:57 PM

## 2022-08-27 ENCOUNTER — Ambulatory Visit (HOSPITAL_BASED_OUTPATIENT_CLINIC_OR_DEPARTMENT_OTHER): Payer: Medicare PPO | Admitting: Physical Therapy

## 2022-08-27 ENCOUNTER — Encounter (HOSPITAL_BASED_OUTPATIENT_CLINIC_OR_DEPARTMENT_OTHER): Payer: Self-pay | Admitting: Physical Therapy

## 2022-08-27 DIAGNOSIS — M6281 Muscle weakness (generalized): Secondary | ICD-10-CM

## 2022-08-27 DIAGNOSIS — R262 Difficulty in walking, not elsewhere classified: Secondary | ICD-10-CM

## 2022-08-27 DIAGNOSIS — M25562 Pain in left knee: Secondary | ICD-10-CM | POA: Diagnosis not present

## 2022-08-27 DIAGNOSIS — M25662 Stiffness of left knee, not elsewhere classified: Secondary | ICD-10-CM

## 2022-08-30 ENCOUNTER — Ambulatory Visit (HOSPITAL_BASED_OUTPATIENT_CLINIC_OR_DEPARTMENT_OTHER): Payer: Medicare PPO | Admitting: Physical Therapy

## 2022-08-30 DIAGNOSIS — M25662 Stiffness of left knee, not elsewhere classified: Secondary | ICD-10-CM

## 2022-08-30 DIAGNOSIS — M25562 Pain in left knee: Secondary | ICD-10-CM | POA: Diagnosis not present

## 2022-08-30 DIAGNOSIS — M6281 Muscle weakness (generalized): Secondary | ICD-10-CM | POA: Diagnosis not present

## 2022-08-30 DIAGNOSIS — R262 Difficulty in walking, not elsewhere classified: Secondary | ICD-10-CM

## 2022-08-30 NOTE — Therapy (Signed)
OUTPATIENT PHYSICAL THERAPY TREATMENT NOTE   Patient Name: [name] [name] MRN: [id number] DOB:[birth year], 67 y.o., female Today's Date: 08/31/2022  END OF SESSION:  PT End of Session - 08/30/22 1546     Visit Number 6    Number of Visits 28    Date for PT Re-Evaluation 10/25/22    Authorization Type [employer] MCR    PT Start Time 1452    PT Stop Time 1534    PT Time Calculation (min) 42 min    Activity Tolerance Patient tolerated treatment well    Behavior During Therapy WFL for tasks assessed/performed                 Past Medical History:  Diagnosis Date   Arthritis    Bunion    left foot   Depression    stable on medication   Diverticulitis 2015   Diverticulitis of colon without hemorrhage 09/28/2013   Diverticulosis    Gallstones    GERD (gastroesophageal reflux disease)    Hammer toes of both feet    Hyperlipidemia    no meds needed   IBS (irritable bowel syndrome)    Meniscal injury    Tear   Onychomycosis of toenail    Osteopenia    Osteoporosis    PUD (peptic ulcer disease) 1980's   Renal cyst    4.5-6 cm stable single  seen by uro in past   Past Surgical History:  Procedure Laterality Date   CHOLECYSTECTOMY  1987   COLONOSCOPY     IRRIGATION AND DEBRIDEMENT SEBACEOUS CYST Right 3/ 2014   chest wall   KNEE ARTHROSCOPY WITH MENISCAL REPAIR Left 07/30/2022   Procedure: LEFT KNEE ARTHROSCOPY WITH MEDIAL MENISCAL ROOT REPAIR;  Surgeon: Huel Cote, MD;  Location: [facility];  Service: Orthopedics;  Laterality: Left;   UPPER GASTROINTESTINAL ENDOSCOPY     WISDOM TOOTH EXTRACTION  age 51 & 38   lower teeth done first and upper wisdom teeth last   Patient Active Problem List   Diagnosis Date Noted   Acute medial meniscus tear of left knee 07/30/2022   Elevated alkaline phosphatase measurement 09/26/2014   Renal cyst, left 12/31/2013   Osteopenia 12/31/2013   Vitamin D deficiency 12/31/2013   Postmenopausal atrophic vaginitis  12/31/2013   Family history of colon cancer 10/02/2013   Wrist pain, right 05/13/2013   Hx of fall 05/13/2013   Hypercholesteremia 05/13/2013   History of kidney stones    Musculoskeletal leg pain 02/02/2013   Gait abnormality 02/02/2013   Chest discomfort 05/28/2011   Left wrist injury 12/01/2010   Renal cyst 12/01/2010   Cough 09/27/2010   Neck pain 08/29/2010   Tingling sensation 08/29/2010   Allergic rhinitis, seasonal 08/29/2010   LEG PAIN, RIGHT 07/01/2009   VARICOSE VEINS LOWER EXTREMITIES W/OTH COMPS 12/06/2008   BACK PAIN, THORACIC REGION 12/06/2008   BACK PAIN 11/10/2008   BRACHIAL NEURITIS OR RADICULITIS NOS 10/29/2008   Irritable bowel syndrome 04/23/2007   HYPERLIPIDEMIA NEC/NOS 01/10/2007   DEPRESSION 01/08/2007   GERD 01/08/2007   Diverticulosis of colon (without mention of hemorrhage) 07/28/2002     REFERRING PROVIDER: Huel Cote, MD   REFERRING DIAG: M23.92 (ICD-10-CM) - Acute internal derangement of left knee     S/p L knee medial meniscal root repair  THERAPY DIAG:  Left knee pain, unspecified chronicity  Stiffness of left knee, not elsewhere classified  Muscle weakness (generalized)  Difficulty in walking, not elsewhere classified  Rationale for Evaluation  and Treatment: Rehabilitation  ONSET DATE: DOS 07/30/2022  SUBJECTIVE:   SUBJECTIVE STATEMENT: Pt is 4 weeks and 3 days s/p left knee medial meniscal root repair.  Pt denies any adverse effects after prior Rx.  Pt didn't use ice after prior Rx.  Pt reports compliance with HEP and she has no adverse effects with HEP.  Pt is performing the supine heel slides with strap at least 2 times per day.  Pt ambulating with knee brace 0-30 deg without adverse effects.  Pt is increasing her Wb'ing without increased pain.    Pt states she increased her walking at the [facility] on Tuesday night and had minimally increased pain on Wednesday.  Pt reports no pain at rest, but a 1/10 pain with bending  knee.     PERTINENT HISTORY: -Status post left knee medial meniscal root repair 07/30/2022.  -MRI findings of L knee moderate medial compartment deg changes -Pt has a Hx of Baker's cyst.   -Osteoporosis, OA, and lumbar pain  PAIN:  Are you having pain? Yes NPRS:  0/10 current, 4/10 worst, 0/10 best Location:  L knee  PRECAUTIONS: Other: per surgical protocol and Wb'ing restrictions  WEIGHT BEARING RESTRICTIONS: Yes Advance WB'ing  FALLS:  Has patient fallen in last 6 months? No  LIVING ENVIRONMENT: Lives with: lives alone Lives in: 1 story home Stairs: ramp to enter home.  Pt has 1 step in home with a rail.  Has following equipment at home:  FWW.  Sister has a cane.  OCCUPATION: Pt is retired  PLOF: Independent.  Pt was able to perform her normal functional mobility skills and daily activities independently without difficulty.  Pt ambulated without an AD.  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    PATIENT GOALS: return to PLOF   OBJECTIVE:   DIAGNOSTIC FINDINGS: Pt is post op.  MRI before surgery also showed mod medial compartment deg changes with mild patellofemoral and lateral compartment deg chondrosis and a tiny Baker's cyst.    TODAY'S TREATMENT:    LOWER EXTREMITY ROM:  AROM Right eval Left eval Left 3/21 Left 3/28 Left 4/4 Left 4/8 Left 4/11  Hip flexion         Hip extension         Hip abduction         Hip adduction         Hip internal rotation         Hip external rotation         Knee flexion 118  AAROM:  41 deg  AAROM:  62 deg  53 to 67 deg AROM / 74 deg PROM 77 deg AAROM ; 82 deg PROM 72 - 81 deg AAROM ; 87PROM  Knee extension 0 WFL AROM/PROM:  2/0  0 0   Ankle dorsiflexion         Ankle plantarflexion         Ankle inversion         Ankle eversion          (Blank rows = not tested)     -Pt performed: Standing heel raises 3x10 with brace  Supine SLR 3x10 reps Supine knee flexion AAROM with  strap 2 x 10 reps S/L hip abd 2x15 S/L hip add 2x15 Prone hip extension 2x10 -Pt received L knee flexion and extension PROM per pt/tissue tolerance within protocol range seated at EOT and in supine.    PT  updated HEP and gave pt a HEP handout.  Educated pt in correct form and appropriate frequency.  PT instructed pt to perform standing heel raises in brace.    Gait: PT received message from Dr. Steward Drone that it was ok to unlock brace.  PT opened brace to 0-50 deg.  Pt ambulated in the clinic with FWW with brace at 0-50 deg without pain and without any c/o's.    -See below for pt education                                                                                             PATIENT EDUCATION:  Education details: HEP, Exercise form, relevant anatomy, POC, gait, brace, post op and protocol restrictions, and MD orders.  How to adjust ROM limits on brace.  Person educated: Patient and sister Education method: Explanation, Demonstration, Tactile cues, Verbal cues, and Handouts Education comprehension: verbalized understanding, returned demonstration, verbal cues required, tactile cues required, and needs further education  HOME EXERCISE PROGRAM: Access Code: [email or web address] URL: [email or web address] Date: 08/02/2022 Prepared by: Aaron Edelman  Exercises - Supine Quadricep Sets  - 2 x daily - 7 x weekly - 2 sets - 10 reps - 5 seconds hold - ANKLE PUMPS   20-30 every hour  Supine Active Straight Leg Raise  - 1 x daily - 7 x weekly - 2 sets - 10 reps - Sitting Heel Slide with hands under thigh  - 2 x daily - 7 x weekly - 2 sets - 10 reps - Supine Heel Slide with Strap  - 2 x daily - 7 x weekly - 2 sets - 10 reps - Long Sitting Ankle Plantar Flexion with Resistance  - 1 x daily - 7 x weekly - 2-3 sets - 10 reps - Sidelying Hip Abduction  - 1 x daily - 7 x weekly - 2 sets - 10 reps - Sidelying Hip Adduction  - 1 x daily - 7 x weekly - 2 sets - 10 reps  Updated HEP: - Prone Hip  Extension  - 1 x daily - 7 x weekly - 2 sets - 10 reps - Heel Raises with Counter Support  - 1 x daily - 7 x weekly - 2 sets - 10 reps  ASSESSMENT:  CLINICAL IMPRESSION: Pt has been ambulating with brace at 0-30 deg with FWW with good stability and without c/o's.  Pt presented to Rx with her brace having increased ROM.  Pt denies moving the ROM limits and she is not sure what happened.  PT educated pt with how to move the ROM limits including how to return to appropriate ROM if the limits get moved again.  Pt and sister demonstrated good understanding.  PT adjusted brace to appropriate ROM.  PT opened brace to 0-50 deg and pt ambulated well in the clinic.  Pt demonstrates good stability and had no pain ambulating with FWW with brace at 0-50 deg.  Pt does have tightness and limitations with knee flexion ROM which improved with knee flexion PROM.  L knee flexion AAROM improved from 72 deg to 81 deg after knee PROM.  Pt  performed exercises well and had no pain with standing heel raises in brace.  Pt required minimal cues to decrease extensor lag with supine SLR and she demonstrated improved form with cuing and concentration.  PT updated HEP and gave pt a HEP handout.  Pt demonstrates good understanding of HEP.  She responded well to Rx having no pain after Rx.  Pt should benefit from continued skilled PT services per protocol to address goals and impairments and to improve overall function.     OBJECTIVE IMPAIRMENTS: Abnormal gait, decreased activity tolerance, decreased endurance, decreased knowledge of use of DME, decreased mobility, difficulty walking, decreased ROM, decreased strength, hypomobility, increased edema, impaired flexibility, and pain.   ACTIVITY LIMITATIONS: bending, standing, squatting, stairs, transfers, bed mobility, dressing, and locomotion level  PARTICIPATION LIMITATIONS: meal prep, cleaning, laundry, driving, shopping, community activity, and volunteer work  PERSONAL FACTORS: 3+  comorbidities: moderate deg changes, osteoporosis, and lumbar pain  are also affecting patient's functional outcome.   REHAB POTENTIAL: Good  CLINICAL DECISION MAKING: Stable/uncomplicated  EVALUATION COMPLEXITY: Low   GOALS:  SHORT TERM GOALS: Target date: 08/30/2022  Pt will be independent and compliant with HEP for improved pain, ROM, strength, and function.  Baseline: Goal status: GOAL MET  2.  Pt will progress with Wb'ing per MD orders without adverse effects for improved mobility.  Baseline:  Goal status: GOAL MET  3.  Pt will demo L knee flexion PROM to be 90 deg for improved stiffness and mobility.  Baseline:  Goal status: INITIAL  4.  Pt will demo a good quad set and perform a supine SLR independently without significant extensor lag for improved strength.  Baseline:  Goal status: INITIAL  5.  Pt will be able to perform a 6 inch step up with good form and control.  Baseline:  Goal status: INITIAL Target date:  10/11/2022  6.  Pt will ambulate with a normalized heel to toe gait pattern without AD.  Baseline:  Goal status: INITIAL Target date:  10/25/2022  7.  Pt will demo L knee AROM to 0 - 115 deg for improved mobility and stiffness.  Baseline:  Goal status: INITIAL Target date:  10/25/2022   LONG TERM GOALS: Target date: 11/22/2022   Pt will ambulate extended community distance without difficulty and significant pain.  Baseline:  Goal status: INITIAL  2.  Pt will be able to perform stairs with a reciprocal gait with the rail.   Baseline:  Goal status: INITIAL  3.  Pt will be able to perform her ADLs and IADLs without significant pain and difficulty.  Baseline:  Goal status: INITIAL  4.  Pt will be able to perform her volunteer work without adverse effects.  Baseline:  Goal status: INITIAL  5.  Pt will demo 5/5 strength in L hip flex and abd and at least 4+/5 strength in L knee flex and extension for improved performance of and tolerance with  functional mobility.  Baseline:  Goal status: INITIAL     PLAN:  PT FREQUENCY:  1x/wk x 4 weeks and 2x/wk afterwards  PT DURATION: other: 16 weeks  PLANNED INTERVENTIONS: Therapeutic exercises, Therapeutic activity, Neuromuscular re-education, Balance training, Gait training, Patient/Family education, Self Care, Joint mobilization, Stair training, Aquatic Therapy, Dry Needling, Electrical stimulation, Cryotherapy, Moist heat, scar mobilization, Taping, Ultrasound, Manual therapy, and Re-evaluation  PLAN FOR NEXT SESSION:  Cont per Dr. Serena Croissant meniscus repair protocol.  MD note indicated to advance her weightbearing.   RTMD:  4/19.  Audie Clearoby Freda Jaquith III PT, DPT 08/31/22 1:38 PM

## 2022-08-31 ENCOUNTER — Encounter (HOSPITAL_BASED_OUTPATIENT_CLINIC_OR_DEPARTMENT_OTHER): Payer: Self-pay | Admitting: Physical Therapy

## 2022-09-02 NOTE — Therapy (Signed)
OUTPATIENT PHYSICAL THERAPY TREATMENT NOTE   Patient Name: [name] [name] MRN: [id number] DOB:[birth year], 67 y.o., female Today's Date: 09/04/2022  END OF SESSION:  PT End of Session - 09/03/22 1504     Visit Number 7    Number of Visits 28    Date for PT Re-Evaluation 10/25/22    Authorization Type [employer] M[employer]    PT Start Time 1455    PT Stop Time 1538    PT Time Calculation (min) 43 min    Activity Tolerance Patient tolerated treatment well    Behavior During Therapy WFL for tasks assessed/performed                  Past Medical History:  Diagnosis Date   Arthritis    Bunion    left foot   Depression    stable on medication   Diverticulitis 2015   Diverticulitis of colon without hemorrhage 09/28/2013   Diverticulosis    Gallstones    GERD (gastroesophageal reflux disease)    Hammer toes of both feet    Hyperlipidemia    no meds needed   IBS (irritable bowel syndrome)    Meniscal injury    Tear   Onychomycosis of toenail    Osteopenia    Osteoporosis    PUD (peptic ulcer disease) 1980's   Renal cyst    4.5-6 cm stable single  seen by uro in past   Past Surgical History:  Procedure Laterality Date   CHOLECYSTECTOMY  1987   COLONOSCOPY     IRRIGATION AND DEBRIDEMENT SEBACEOUS CYST Right 3/ 2014   chest wall   KNEE ARTHROSCOPY WITH MENISCAL REPAIR Left 07/30/2022   Procedure: LEFT KNEE ARTHROSCOPY WITH MEDIAL MENISCAL ROOT REPAIR;  Surgeon: Huel Cote, MD;  Location: [facility];  Service: Orthopedics;  Laterality: Left;   UPPER GASTROINTESTINAL ENDOSCOPY     WISDOM TOOTH EXTRACTION  age 34 & 49   lower teeth done first and upper wisdom teeth last   Patient Active Problem List   Diagnosis Date Noted   Acute medial meniscus tear of left knee 07/30/2022   Elevated alkaline phosphatase measurement 09/26/2014   Renal cyst, left 12/31/2013   Osteopenia 12/31/2013   Vitamin D deficiency 12/31/2013   Postmenopausal atrophic  vaginitis 12/31/2013   Family history of colon cancer 10/02/2013   Wrist pain, right 05/13/2013   Hx of fall 05/13/2013   Hypercholesteremia 05/13/2013   History of kidney stones    Musculoskeletal leg pain 02/02/2013   Gait abnormality 02/02/2013   Chest discomfort 05/28/2011   Left wrist injury 12/01/2010   Renal cyst 12/01/2010   Cough 09/27/2010   Neck pain 08/29/2010   Tingling sensation 08/29/2010   Allergic rhinitis, seasonal 08/29/2010   LEG PAIN, RIGHT 07/01/2009   VARICOSE VEINS LOWER EXTREMITIES W/OTH COMPS 12/06/2008   BACK PAIN, THORACIC REGION 12/06/2008   BACK PAIN 11/10/2008   BRACHIAL NEURITIS OR RADICULITIS NOS 10/29/2008   Irritable bowel syndrome 04/23/2007   HYPERLIPIDEMIA NEC/NOS 01/10/2007   DEPRESSION 01/08/2007   GERD 01/08/2007   Diverticulosis of colon (without mention of hemorrhage) 07/28/2002     REFERRING PROVIDER: Huel Cote, MD   REFERRING DIAG: M23.92 (ICD-10-CM) - Acute internal derangement of left knee     S/p L knee medial meniscal root repair  THERAPY DIAG:  Left knee pain, unspecified chronicity  Stiffness of left knee, not elsewhere classified  Muscle weakness (generalized)  Difficulty in walking, not elsewhere classified  Rationale for  Evaluation and Treatment: Rehabilitation  ONSET DATE: DOS 07/30/2022   SUBJECTIVE:   SUBJECTIVE STATEMENT: Pt is 5 weeks s/p left knee medial meniscal root repair.  Pt denies any adverse effects after prior Rx, just a little sore.  Pt didn't use ice after prior Rx.  Pt reports compliance with HEP and she has no adverse effects with HEP.  Pt states her knee bending is "slow going" initially though improves with reps.  Pt ambulating with knee brace 0-50 deg without adverse effects.  Pt reports she woke up about 3:30 AM with some pain and her pain went away with changing positions.  Pt states her brace moved limits moved again and she had to change it back to 50 deg.   Pt is increasing her  Wb'ing without increased pain.      PERTINENT HISTORY: -Status post left knee medial meniscal root repair 07/30/2022.  -MRI findings of L knee moderate medial compartment deg changes -Pt has a Hx of Baker's cyst.   -Osteoporosis, OA, and lumbar pain  PAIN:  Are you having pain? Yes NPRS:  0/10 current, 4/10 worst, 0/10 best Location:  L knee  PRECAUTIONS: Other: per surgical protocol and Wb'ing restrictions  WEIGHT BEARING RESTRICTIONS: Yes Advance WB'ing  FALLS:  Has patient fallen in last 6 months? No  LIVING ENVIRONMENT: Lives with: lives alone Lives in: 1 story home Stairs: ramp to enter home.  Pt has 1 step in home with a rail.  Has following equipment at home:  FWW.  Sister has a cane.  OCCUPATION: Pt is retired  PLOF: Independent.  Pt was able to perform her normal functional mobility skills and daily activities independently without difficulty.  Pt ambulated without an AD.  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    PATIENT GOALS: return to [facility]  Next MD visit: 09/07/2022   OBJECTIVE:   DIAGNOSTIC FINDINGS: Pt is post op.  MRI before surgery also showed mod medial compartment deg changes with mild patellofemoral and lateral compartment deg chondrosis and a tiny Baker's cyst.    TODAY'S TREATMENT:    LOWER EXTREMITY ROM:  ROM Right eval Left eval Left 3/21 Left 3/28 Left 4/4 Left 4/8 Left 4/11 Left 4/15  Hip flexion          Hip extension          Hip abduction          Hip adduction          Hip internal rotation          Hip external rotation          Knee flexion 118  AAROM:  41 deg  AAROM:  62 deg  53 to 67 deg AROM / 74 deg PROM 77 deg AAROM ; 82 deg PROM 72 - 81 deg AAROM ; 87PROM A/AA/PROM: 73/86/89  Knee extension 0 WFL AROM/PROM:  2/0  0 0 0 AROM/PROM:1/0  Ankle dorsiflexion          Ankle plantarflexion          Ankle inversion          Ankle eversion           (Blank rows = not  tested)     -Pt performed: Standing heel raises 3x10 with brace  Supine knee flexion AAROM with strap  x 10 reps Supine heel slides 2x10 reps Supine SLR x10 reps with 0#, 2x10 with 1# S/L  hip abd 2x10 with 1# S/L hip add 2x15 Prone hip extension 2x10 -Pt received L knee flexion and extension PROM per pt/tissue tolerance within protocol range in supine and flexion PROM seated at EOT.    PT updated HEP with supine heel slides AROM.  PT educated pt in appropriate ROM and instructed pt to not perform into a painful ROM.  Pt received a HEP handout.  Gait: PT educated pt concerning brace ROM and instructed pt in how to change the ROM limits.  PT opened brace to 70 deg today.  Pt ambulated in the clinic with FWW with brace at 0-70 deg with cuing and instruction for heel to toe gait without pain and without any c/o's.    -See below for pt education                                                                                             PATIENT EDUCATION:  Education details: HEP, Exercise form, relevant anatomy, POC, gait, brace, post op and protocol restrictions, and MD orders.  How to adjust ROM limits on brace.  Person educated: Patient and sister Education method: Explanation, Demonstration, Tactile cues, Verbal cues, and Handouts Education comprehension: verbalized understanding, returned demonstration, verbal cues required, tactile cues required, and needs further education  HOME EXERCISE PROGRAM: Access Code: [email or web address] URL: [email or web address] Date: 08/02/2022 Prepared by: Aaron Edelman  Exercises - Supine Quadricep Sets  - 2 x daily - 7 x weekly - 2 sets - 10 reps - 5 seconds hold - ANKLE PUMPS   20-30 every hour  Supine Active Straight Leg Raise  - 1 x daily - 7 x weekly - 2 sets - 10 reps - Sitting Heel Slide with hands under thigh  - 2 x daily - 7 x weekly - 2 sets - 10 reps - Supine Heel Slide with Strap  - 2 x daily - 7 x weekly - 2 sets - 10 reps - Long  Sitting Ankle Plantar Flexion with Resistance  - 1 x daily - 7 x weekly - 2-3 sets - 10 reps - Sidelying Hip Abduction  - 1 x daily - 7 x weekly - 2 sets - 10 reps - Sidelying Hip Adduction  - 1 x daily - 7 x weekly - 2 sets - 10 reps  Updated HEP: - Supine heel slides 2x/day, 1-2 sets of 10 reps  ASSESSMENT:  CLINICAL IMPRESSION: Pt has been ambulating with brace at 0-50 deg with FWW with good stability and without c/o's.  Pt states her brace ROM limits continue to change on her at random time, but she has adjusted it back to 50 deg.  PT educated pt on how to change the brace limits and pt demonstrates good understanding.   PT opened brace to 0-70 deg and PT provided cuing for heel to toe gait pattern.  Pt ambulated well in the clinic without pain and without c/o's.  She had good stability ambulating with brace at 0-70 deg.  Pt continues to have tightness and limitations with knee flexion ROM though is making steady progress as evidenced by goniometric measurement.  Pt was  lacking 1 deg of extension AROM though able to achieve 0 deg easily passively.  Pt performed exercises per protocol well with cuing for correct form.  She demonstrates good form with supine SLR.  PT updated HEP and gave pt a HEP handout.  Pt demonstrates good understanding of HEP.  She responded well to Rx having no pain after Rx.  Pt should benefit from continued skilled PT services per protocol to address goals and impairments and to improve overall function.     OBJECTIVE IMPAIRMENTS: Abnormal gait, decreased activity tolerance, decreased endurance, decreased knowledge of use of DME, decreased mobility, difficulty walking, decreased ROM, decreased strength, hypomobility, increased edema, impaired flexibility, and pain.   ACTIVITY LIMITATIONS: bending, standing, squatting, stairs, transfers, bed mobility, dressing, and locomotion level  PARTICIPATION LIMITATIONS: meal prep, cleaning, laundry, driving, shopping, community  activity, and volunteer work  PERSONAL FACTORS: 3+ comorbidities: moderate deg changes, osteoporosis, and lumbar pain  are also affecting patient's functional outcome.   REHAB POTENTIAL: Good  CLINICAL DECISION MAKING: Stable/uncomplicated  EVALUATION COMPLEXITY: Low   GOALS:  SHORT TERM GOALS: Target date: 08/30/2022  Pt will be independent and compliant with HEP for improved pain, ROM, strength, and function.  Baseline: Goal status: GOAL MET  2.  Pt will progress with Wb'ing per MD orders without adverse effects for improved mobility.  Baseline:  Goal status: GOAL MET  3.  Pt will demo L knee flexion PROM to be 90 deg for improved stiffness and mobility.  Baseline:  Goal status: INITIAL  4.  Pt will demo a good quad set and perform a supine SLR independently without significant extensor lag for improved strength.  Baseline:  Goal status: INITIAL  5.  Pt will be able to perform a 6 inch step up with good form and control.  Baseline:  Goal status: INITIAL Target date:  10/11/2022  6.  Pt will ambulate with a normalized heel to toe gait pattern without AD.  Baseline:  Goal status: INITIAL Target date:  10/25/2022  7.  Pt will demo L knee AROM to 0 - 115 deg for improved mobility and stiffness.  Baseline:  Goal status: INITIAL Target date:  10/25/2022   LONG TERM GOALS: Target date: 11/22/2022   Pt will ambulate extended community distance without difficulty and significant pain.  Baseline:  Goal status: INITIAL  2.  Pt will be able to perform stairs with a reciprocal gait with the rail.   Baseline:  Goal status: INITIAL  3.  Pt will be able to perform her ADLs and IADLs without significant pain and difficulty.  Baseline:  Goal status: INITIAL  4.  Pt will be able to perform her volunteer work without adverse effects.  Baseline:  Goal status: INITIAL  5.  Pt will demo 5/5 strength in L hip flex and abd and at least 4+/5 strength in L knee flex and extension for  improved performance of and tolerance with functional mobility.  Baseline:  Goal status: INITIAL     PLAN:  PT FREQUENCY:  1x/wk x 4 weeks and 2x/wk afterwards  PT DURATION: other: 16 weeks  PLANNED INTERVENTIONS: Therapeutic exercises, Therapeutic activity, Neuromuscular re-education, Balance training, Gait training, Patient/Family education, Self Care, Joint mobilization, Stair training, Aquatic Therapy, Dry Needling, Electrical stimulation, Cryotherapy, Moist heat, scar mobilization, Taping, Ultrasound, Manual therapy, and Re-evaluation  PLAN FOR NEXT SESSION:  Cont per Dr. Serena Croissant meniscus repair protocol.  Work on improving flexion ROM and monitor extension ROM.  MD note indicated to advance  her weightbearing.  Will send message concerning Wb'ing status and AD.  RTMD:  4/19.      Audie Clear III PT, DPT 09/04/22 12:59 PM

## 2022-09-03 ENCOUNTER — Ambulatory Visit (HOSPITAL_BASED_OUTPATIENT_CLINIC_OR_DEPARTMENT_OTHER): Payer: Medicare PPO | Admitting: Physical Therapy

## 2022-09-03 DIAGNOSIS — M25562 Pain in left knee: Secondary | ICD-10-CM | POA: Diagnosis not present

## 2022-09-03 DIAGNOSIS — M25662 Stiffness of left knee, not elsewhere classified: Secondary | ICD-10-CM

## 2022-09-03 DIAGNOSIS — M6281 Muscle weakness (generalized): Secondary | ICD-10-CM

## 2022-09-03 DIAGNOSIS — R262 Difficulty in walking, not elsewhere classified: Secondary | ICD-10-CM | POA: Diagnosis not present

## 2022-09-04 ENCOUNTER — Encounter (HOSPITAL_BASED_OUTPATIENT_CLINIC_OR_DEPARTMENT_OTHER): Payer: Self-pay | Admitting: Physical Therapy

## 2022-09-06 ENCOUNTER — Encounter (HOSPITAL_BASED_OUTPATIENT_CLINIC_OR_DEPARTMENT_OTHER): Payer: Self-pay | Admitting: Physical Therapy

## 2022-09-06 ENCOUNTER — Ambulatory Visit (HOSPITAL_BASED_OUTPATIENT_CLINIC_OR_DEPARTMENT_OTHER): Payer: Medicare PPO | Admitting: Physical Therapy

## 2022-09-06 DIAGNOSIS — M6281 Muscle weakness (generalized): Secondary | ICD-10-CM | POA: Diagnosis not present

## 2022-09-06 DIAGNOSIS — M25562 Pain in left knee: Secondary | ICD-10-CM | POA: Diagnosis not present

## 2022-09-06 DIAGNOSIS — R262 Difficulty in walking, not elsewhere classified: Secondary | ICD-10-CM | POA: Diagnosis not present

## 2022-09-06 DIAGNOSIS — M25662 Stiffness of left knee, not elsewhere classified: Secondary | ICD-10-CM

## 2022-09-06 NOTE — Therapy (Signed)
OUTPATIENT PHYSICAL THERAPY TREATMENT NOTE   Patient Name: [name] [name] MRN: [id number] DOB:1956-01-26, 67 y.o., female Today's Date: 09/07/2022  END OF SESSION:  PT End of Session - 09/06/22 1459     Visit Number 8    Number of Visits 28    Date for PT Re-Evaluation 10/25/22    Authorization Type [employer] MCR    PT Start Time 1450    PT Stop Time 1534    PT Time Calculation (min) 44 min    Activity Tolerance Patient tolerated treatment well    Behavior During Therapy WFL for tasks assessed/performed                  Past Medical History:  Diagnosis Date   Arthritis    Bunion    left foot   Depression    stable on medication   Diverticulitis 2015   Diverticulitis of colon without hemorrhage 09/28/2013   Diverticulosis    Gallstones    GERD (gastroesophageal reflux disease)    Hammer toes of both feet    Hyperlipidemia    no meds needed   IBS (irritable bowel syndrome)    Meniscal injury    Tear   Onychomycosis of toenail    Osteopenia    Osteoporosis    PUD (peptic ulcer disease) 1980's   Renal cyst    4.5-6 cm stable single  seen by uro in past   Past Surgical History:  Procedure Laterality Date   CHOLECYSTECTOMY  1987   COLONOSCOPY     IRRIGATION AND DEBRIDEMENT SEBACEOUS CYST Right 3/ 2014   chest wall   KNEE ARTHROSCOPY WITH MENISCAL REPAIR Left 07/30/2022   Procedure: LEFT KNEE ARTHROSCOPY WITH MEDIAL MENISCAL ROOT REPAIR;  Surgeon: Huel Cote, MD;  Location: [facility];  Service: Orthopedics;  Laterality: Left;   UPPER GASTROINTESTINAL ENDOSCOPY     WISDOM TOOTH EXTRACTION  age 69 & 78   lower teeth done first and upper wisdom teeth last   Patient Active Problem List   Diagnosis Date Noted   Acute medial meniscus tear of left knee 07/30/2022   Elevated alkaline phosphatase measurement 09/26/2014   Renal cyst, left 12/31/2013   Osteopenia 12/31/2013   Vitamin D deficiency 12/31/2013   Postmenopausal atrophic  vaginitis 12/31/2013   Family history of colon cancer 10/02/2013   Wrist pain, right 05/13/2013   Hx of fall 05/13/2013   Hypercholesteremia 05/13/2013   History of kidney stones    Musculoskeletal leg pain 02/02/2013   Gait abnormality 02/02/2013   Chest discomfort 05/28/2011   Left wrist injury 12/01/2010   Renal cyst 12/01/2010   Cough 09/27/2010   Neck pain 08/29/2010   Tingling sensation 08/29/2010   Allergic rhinitis, seasonal 08/29/2010   LEG PAIN, RIGHT 07/01/2009   VARICOSE VEINS LOWER EXTREMITIES W/OTH COMPS 12/06/2008   BACK PAIN, THORACIC REGION 12/06/2008   BACK PAIN 11/10/2008   BRACHIAL NEURITIS OR RADICULITIS NOS 10/29/2008   Irritable bowel syndrome 04/23/2007   HYPERLIPIDEMIA NEC/NOS 01/10/2007   DEPRESSION 01/08/2007   GERD 01/08/2007   Diverticulosis of colon (without mention of hemorrhage) 07/28/2002     REFERRING PROVIDER: Huel Cote, MD   REFERRING DIAG: M23.92 (ICD-10-CM) - Acute internal derangement of left knee     S/p L knee medial meniscal root repair  THERAPY DIAG:  Left knee pain, unspecified chronicity  Stiffness of left knee, not elsewhere classified  Muscle weakness (generalized)  Difficulty in walking, not elsewhere classified  Rationale for  Evaluation and Treatment: Rehabilitation  ONSET DATE: DOS 07/30/2022   SUBJECTIVE:   SUBJECTIVE STATEMENT: Pt is 5 weeks and 3 days s/p left knee medial meniscal root repair.  Pt denies any adverse effects after prior Rx.  Pt didn't use ice after prior Rx.  Pt reports compliance with HEP and she has no adverse effects with HEP.  Pt is sleeping without brace.  PT sent a message to MD concerning Wb'ing and AD.  MD sent a message back to PT stating that it would be great for pt to use a cane.  Pt ambulating with knee brace 0-70 deg without adverse effects.     PERTINENT HISTORY: -Status post left knee medial meniscal root repair 07/30/2022.  -MRI findings of L knee moderate medial  compartment deg changes -Pt has a Hx of Baker's cyst.   -Osteoporosis, OA, and lumbar pain  PAIN:  Are you having pain? Yes NPRS:  0/10 current, 4/10 worst, 0/10 best Location:  L knee  PRECAUTIONS: Other: per surgical protocol and Wb'ing restrictions  WEIGHT BEARING RESTRICTIONS: Yes Advance WB'ing  FALLS:  Has patient fallen in last 6 months? No  LIVING ENVIRONMENT: Lives with: lives alone Lives in: 1 story home Stairs: ramp to enter home.  Pt has 1 step in home with a rail.  Has following equipment at home:  FWW.  Sister has a cane.  OCCUPATION: Pt is retired  PLOF: Independent.  Pt was able to perform her normal functional mobility skills and daily activities independently without difficulty.  Pt ambulated without an AD.  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    PATIENT GOALS: return to [facility]  Next MD visit: 09/07/2022   OBJECTIVE:   DIAGNOSTIC FINDINGS: Pt is post op.  MRI before surgery also showed mod medial compartment deg changes with mild patellofemoral and lateral compartment deg chondrosis and a tiny Baker's cyst.    TODAY'S TREATMENT:    LOWER EXTREMITY ROM:  ROM Right eval Left eval Left 3/21 Left 3/28 Left 4/4 Left 4/8 Left 4/11 Left 4/15 Left 4/18  Hip flexion           Hip extension           Hip abduction           Hip adduction           Hip internal rotation           Hip external rotation           Knee flexion 118  AAROM:  41 deg  AAROM:  62 deg  53 to 67 deg AROM / 74 deg PROM 77 deg AAROM ; 82 deg PROM 72 - 81 deg AAROM ; 87PROM A/AA/PROM: 73/86/89 AAROM/AROM:  93 / 87  Knee extension 0 WFL AROM/PROM:  2/0  0 0 0 AROM/PROM:1/0 0  Ankle dorsiflexion           Ankle plantarflexion           Ankle inversion           Ankle eversion            (Blank rows = not tested)     -Pt performed: Supine knee flexion AAROM with strap  x 10 reps Supine heel slides approx 15 reps Supine SLR x10  reps 2x10 with 1# S/L hip abd 2x10 with 1# S/L hip add 2x15 Longsitting gastroc stretch 3x20-30 sec  -Pt received L  knee flexion and extension PROM per pt/tissue tolerance within protocol range in supine and flexion PROM seated at EOT.    Gait: PT educated pt concerning brace ROM and instructed pt in how to change the ROM limits.  Pt ambulated with brace at 0-70 deg and 0-80 deg with cane with cuing for toe off and knee flexion and to stand up straight.       -See below for pt education                                                                                             PATIENT EDUCATION:  Education details: HEP, Exercise form, relevant anatomy, POC, gait, brace, post op and protocol restrictions, and MD orders.  How to adjust ROM limits on brace.  Person educated: Patient and sister Education method: Explanation, Demonstration, Tactile cues, Verbal cues, and Handouts Education comprehension: verbalized understanding, returned demonstration, verbal cues required, tactile cues required, and needs further education  HOME EXERCISE PROGRAM: Access Code: [email or web address] URL: [email or web address] Date: 08/02/2022 Prepared by: Aaron Edelman  Exercises - Supine Quadricep Sets  - 2 x daily - 7 x weekly - 2 sets - 10 reps - 5 seconds hold - ANKLE PUMPS   20-30 every hour  Supine Active Straight Leg Raise  - 1 x daily - 7 x weekly - 2 sets - 10 reps - Sitting Heel Slide with hands under thigh  - 2 x daily - 7 x weekly - 2 sets - 10 reps - Supine Heel Slide with Strap  - 2 x daily - 7 x weekly - 2 sets - 10 reps - Long Sitting Ankle Plantar Flexion with Resistance  - 1 x daily - 7 x weekly - 2-3 sets - 10 reps - Sidelying Hip Abduction  - 1 x daily - 7 x weekly - 2 sets - 10 reps - Sidelying Hip Adduction  - 1 x daily - 7 x weekly - 2 sets - 10 reps - Supine heel slides 2x/day, 1-2 sets of 10 reps  ASSESSMENT:  CLINICAL IMPRESSION: Pt has been ambulating with brace at 0-70  deg with FWW with good stability and without c/o's.  PT sent a message to MD concerning usage of AD at this time in protocol and MD returned message indicated for pt using a cane.  PT opened brace to 0-80 deg and had pt ambulate with SPC.  PT educated pt with how to use cane properly and instructed pt in proper gait sequencing.  Pt ambulated well with SPC and brace at 0-80 deg with good stability without increased pain.  She had no LOB with gait.  Pt is making steady progress with knee flexion ROM.  She has tightness in knee flexion ROM which improves with increased reps of PROM and AA/AROM.  Pt has full knee extension ROM.  Pt performed exercises per protocol well with cuing for correct form.  She demonstrates good form with supine SLR.  She responded well to Rx having no pain after Rx.  Pt should benefit from continued skilled PT services per protocol to address goals and impairments and to improve  overall function.     OBJECTIVE IMPAIRMENTS: Abnormal gait, decreased activity tolerance, decreased endurance, decreased knowledge of use of DME, decreased mobility, difficulty walking, decreased ROM, decreased strength, hypomobility, increased edema, impaired flexibility, and pain.   ACTIVITY LIMITATIONS: bending, standing, squatting, stairs, transfers, bed mobility, dressing, and locomotion level  PARTICIPATION LIMITATIONS: meal prep, cleaning, laundry, driving, shopping, community activity, and volunteer work  PERSONAL FACTORS: 3+ comorbidities: moderate deg changes, osteoporosis, and lumbar pain  are also affecting patient's functional outcome.   REHAB POTENTIAL: Good  CLINICAL DECISION MAKING: Stable/uncomplicated  EVALUATION COMPLEXITY: Low   GOALS:  SHORT TERM GOALS: Target date: 08/30/2022  Pt will be independent and compliant with HEP for improved pain, ROM, strength, and function.  Baseline: Goal status: GOAL MET  2.  Pt will progress with Wb'ing per MD orders without adverse effects  for improved mobility.  Baseline:  Goal status: GOAL MET  3.  Pt will demo L knee flexion PROM to be 90 deg for improved stiffness and mobility.  Baseline:  Goal status: INITIAL  4.  Pt will demo a good quad set and perform a supine SLR independently without significant extensor lag for improved strength.  Baseline:  Goal status: INITIAL  5.  Pt will be able to perform a 6 inch step up with good form and control.  Baseline:  Goal status: INITIAL Target date:  10/11/2022  6.  Pt will ambulate with a normalized heel to toe gait pattern without AD.  Baseline:  Goal status: INITIAL Target date:  10/25/2022  7.  Pt will demo L knee AROM to 0 - 115 deg for improved mobility and stiffness.  Baseline:  Goal status: INITIAL Target date:  10/25/2022   LONG TERM GOALS: Target date: 11/22/2022   Pt will ambulate extended community distance without difficulty and significant pain.  Baseline:  Goal status: INITIAL  2.  Pt will be able to perform stairs with a reciprocal gait with the rail.   Baseline:  Goal status: INITIAL  3.  Pt will be able to perform her ADLs and IADLs without significant pain and difficulty.  Baseline:  Goal status: INITIAL  4.  Pt will be able to perform her volunteer work without adverse effects.  Baseline:  Goal status: INITIAL  5.  Pt will demo 5/5 strength in L hip flex and abd and at least 4+/5 strength in L knee flex and extension for improved performance of and tolerance with functional mobility.  Baseline:  Goal status: INITIAL     PLAN:  PT FREQUENCY:  1x/wk x 4 weeks and 2x/wk afterwards  PT DURATION: other: 16 weeks  PLANNED INTERVENTIONS: Therapeutic exercises, Therapeutic activity, Neuromuscular re-education, Balance training, Gait training, Patient/Family education, Self Care, Joint mobilization, Stair training, Aquatic Therapy, Dry Needling, Electrical stimulation, Cryotherapy, Moist heat, scar mobilization, Taping, Ultrasound, Manual  therapy, and Re-evaluation  PLAN FOR NEXT SESSION:  Cont per Dr. Serena Croissant meniscus repair protocol.  Work on improving flexion ROM and monitor extension ROM.  MD note indicated to advance her weightbearing.  RTMD:  4/19.      Audie Clear III PT, DPT 09/07/22 9:00 PM

## 2022-09-07 ENCOUNTER — Ambulatory Visit (INDEPENDENT_AMBULATORY_CARE_PROVIDER_SITE_OTHER): Payer: Medicare PPO | Admitting: Orthopaedic Surgery

## 2022-09-07 DIAGNOSIS — M23322 Other meniscus derangements, posterior horn of medial meniscus, left knee: Secondary | ICD-10-CM

## 2022-09-07 NOTE — Progress Notes (Signed)
Post Operative Evaluation    Procedure/Date of Surgery: Left knee medial meniscal root repair 3/11  Interval History:   Presents today 6 status post left knee medial meniscal repair overall doing very well.  She is walking with her brace.  She is now walking with just a cane in the left hand.  Overall she is doing quite well.  She has no pain in the knee with ambulation   PMH/PSH/Family History/Social History/Meds/Allergies:    Past Medical History:  Diagnosis Date   Arthritis    Bunion    left foot   Depression    stable on medication   Diverticulitis 2015   Diverticulitis of colon without hemorrhage 09/28/2013   Diverticulosis    Gallstones    GERD (gastroesophageal reflux disease)    Hammer toes of both feet    Hyperlipidemia    no meds needed   IBS (irritable bowel syndrome)    Meniscal injury    Tear   Onychomycosis of toenail    Osteopenia    Osteoporosis    PUD (peptic ulcer disease) 1980's   Renal cyst    4.5-6 cm stable single  seen by uro in past   Past Surgical History:  Procedure Laterality Date   CHOLECYSTECTOMY  1987   COLONOSCOPY     IRRIGATION AND DEBRIDEMENT SEBACEOUS CYST Right 3/ 2014   chest wall   KNEE ARTHROSCOPY WITH MENISCAL REPAIR Left 07/30/2022   Procedure: LEFT KNEE ARTHROSCOPY WITH MEDIAL MENISCAL ROOT REPAIR;  Surgeon: Huel Cote, MD;  Location: [facility];  Service: Orthopedics;  Laterality: Left;   UPPER GASTROINTESTINAL ENDOSCOPY     WISDOM TOOTH EXTRACTION  age 85 & 33   lower teeth done first and upper wisdom teeth last   Social History   Socioeconomic History   Marital status: Divorced    Spouse name: Not on file   Number of children: 0   Years of education: Not on file   Highest education level: Not on file  Occupational History   Occupation: TEACHER    Employer: [employer]  Tobacco Use   Smoking status: Never   Smokeless tobacco: Never  Vaping Use    Vaping Use: Never used  Substance and Sexual Activity   Alcohol use: No   Drug use: No   Sexual activity: Not on file  Other Topics Concern   Not on file  Social History Narrative   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   Non smoker    Hh of 1 ... 2 cats    Single   [id number]   Mom is now in assisted living and she has moved into her old residence   Social Determinants of Health   Financial Resource Strain: Low Risk  (04/25/2022)   Overall Financial Resource Strain (CARDIA)    Difficulty of Paying Living Expenses: Not hard at all  Food Insecurity: No Food Insecurity (04/25/2022)   Hunger Vital Sign    Worried About Running Out of Food in the Last Year: Never true    Ran Out of Food in the Last Year: Never true  Transportation Needs: No Transportation Needs (04/25/2022)   PRAPARE - Administrator, Civil Service (Medical): No    Lack of Transportation (Non-Medical): No  Physical Activity: Insufficiently Active (  04/25/2022)   Exercise Vital Sign    Days of Exercise per Week: 3 days    Minutes of Exercise per Session: 30 min  Stress: Stress Concern Present (04/25/2022)   [employer] of Occupational Health - Occupational Stress Questionnaire    Feeling of Stress : To some extent  Social Connections: Moderately Integrated (04/25/2022)   Social Connection and Isolation Panel [NHANES]    Frequency of Communication with Friends and Family: More than three times a week    Frequency of Social Gatherings with Friends and Family: More than three times a week    Attends Religious Services: More than 4 times per year    Active Member of [employer] or Organizations: Yes    Attends Engineer, structural: More than 4 times per year    Marital Status: Divorced   Family History  Problem Relation Age of Onset   Osteoarthritis Mother    COPD Mother    Pneumonia Mother    Osteoporosis Mother    Rheum arthritis Father    Pneumonia Father        deceased    Colon cancer Father 38   Heart attack Father    Breast cancer Sister 56   Colon polyps Sister    Cancer Maternal Grandmother        ? GI   Heart failure Maternal Grandfather    Breast cancer Paternal Grandmother 96   Heart failure Paternal Grandfather    Esophageal cancer Neg Hx    Rectal cancer Neg Hx    Stomach cancer Neg Hx    Allergies  Allergen Reactions   Fosamax [Alendronate Sodium] Other (See Comments)    Intense pain   Tylenol [Acetaminophen] Other (See Comments)    "Irritates my esophagus"   Current Outpatient Medications  Medication Sig Dispense Refill   aspirin EC 325 MG tablet Take 1 tablet (325 mg total) by mouth daily. 30 tablet 0   betamethasone valerate ointment (VALISONE) 0.1 % Apply a pea sized amoutn topically 1-2 x a week as needed 30 g 0   Calcium Carb-Cholecalciferol (CALCIUM 600+D3 PO) Take 1 tablet by mouth daily.     cetirizine (ZYRTEC ALLERGY) 10 MG tablet Take 1 tablet (10 mg total) by mouth at bedtime. 90 tablet 1   denosumab (PROLIA) 60 MG/ML SOSY injection Inject 60 mg into the skin every 6 (six) months.     famotidine (PEPCID) 40 MG tablet Take 1 tablet (40 mg total) by mouth 2 (two) times daily. 60 tablet 1   FLUoxetine (PROZAC) 10 MG tablet Take 5 mg by mouth daily.     FLUoxetine (PROZAC) 40 MG capsule Take 40 mg by mouth daily.     fluticasone (FLONASE) 50 MCG/ACT nasal spray Place 1 spray into both nostrils daily. 47.4 mL 1   ipratropium (ATROVENT) 0.06 % nasal spray Place 2 sprays into both nostrils 3 (three) times daily. As needed for nasal congestion, runny nose 15 mL 1   Multiple Vitamins-Minerals (CENTRUM ULTRA WOMENS PO) Take 1 tablet by mouth every other day.     oxyCODONE (ROXICODONE) 5 MG immediate release tablet Take 1 tablet (5 mg total) by mouth every 4 (four) hours as needed for severe pain or breakthrough pain. 10 tablet 0   Probiotic Product (PROBIOTIC PO) Take by mouth daily.     No current facility-administered medications for  this visit.   No results found.  Review of Systems:   A ROS was performed including pertinent positives and  negatives as documented in the HPI.   Musculoskeletal Exam:    Left knee range of motion is from 0 to 90 degrees.  Incisions are well-appearing.  No erythema or drainage.  Distal neurosensory exam is intact.  Trace swelling about the knee  Imaging:      I personally reviewed and interpreted the radiographs.   Assessment:   6 weeks status post left knee medial meniscal repair overall doing extremely well.  This time she will continue to bear weight as tolerated.  I would like her to discontinue her brace with ambulation.  I would like her to work on range of motion and to this effect I do believe she would benefit from aquatic therapy.  I will plan to see her back in 6 weeks for reassessment  Plan :    -Return to clinic in 6 weeks for reassessment      I personally saw and evaluated the patient, and participated in the management and treatment plan.  Huel Cote, MD Attending Physician, Orthopedic Surgery  This document was dictated using Dragon voice recognition software. A reasonable attempt at proof reading has been made to minimize errors.

## 2022-09-11 ENCOUNTER — Encounter (HOSPITAL_BASED_OUTPATIENT_CLINIC_OR_DEPARTMENT_OTHER): Payer: Self-pay | Admitting: Physical Therapy

## 2022-09-11 ENCOUNTER — Ambulatory Visit (HOSPITAL_BASED_OUTPATIENT_CLINIC_OR_DEPARTMENT_OTHER): Payer: Medicare PPO | Admitting: Physical Therapy

## 2022-09-11 DIAGNOSIS — M25562 Pain in left knee: Secondary | ICD-10-CM

## 2022-09-11 DIAGNOSIS — M6281 Muscle weakness (generalized): Secondary | ICD-10-CM | POA: Diagnosis not present

## 2022-09-11 DIAGNOSIS — R262 Difficulty in walking, not elsewhere classified: Secondary | ICD-10-CM | POA: Diagnosis not present

## 2022-09-11 DIAGNOSIS — M25662 Stiffness of left knee, not elsewhere classified: Secondary | ICD-10-CM | POA: Diagnosis not present

## 2022-09-11 NOTE — Therapy (Signed)
OUTPATIENT PHYSICAL THERAPY TREATMENT NOTE   Patient Name: [name] [name] MRN: [id number] DOB:05-24-1955, 67 y.o., female Today's Date: 09/12/2022  END OF SESSION:  PT End of Session - 09/11/22 1416     Visit Number 9    Number of Visits 28    Date for PT Re-Evaluation 10/25/22    Authorization Type [employer] MCR    PT Start Time 1407    PT Stop Time 1448    PT Time Calculation (min) 41 min    Activity Tolerance Patient tolerated treatment well    Behavior During Therapy WFL for tasks assessed/performed                  Past Medical History:  Diagnosis Date   Arthritis    Bunion    left foot   Depression    stable on medication   Diverticulitis 2015   Diverticulitis of colon without hemorrhage 09/28/2013   Diverticulosis    Gallstones    GERD (gastroesophageal reflux disease)    Hammer toes of both feet    Hyperlipidemia    no meds needed   IBS (irritable bowel syndrome)    Meniscal injury    Tear   Onychomycosis of toenail    Osteopenia    Osteoporosis    PUD (peptic ulcer disease) 1980's   Renal cyst    4.5-6 cm stable single  seen by uro in past   Past Surgical History:  Procedure Laterality Date   CHOLECYSTECTOMY  1987   COLONOSCOPY     IRRIGATION AND DEBRIDEMENT SEBACEOUS CYST Right 3/ 2014   chest wall   KNEE ARTHROSCOPY WITH MENISCAL REPAIR Left 07/30/2022   Procedure: LEFT KNEE ARTHROSCOPY WITH MEDIAL MENISCAL ROOT REPAIR;  Surgeon: Huel Cote, MD;  Location: [facility];  Service: Orthopedics;  Laterality: Left;   UPPER GASTROINTESTINAL ENDOSCOPY     WISDOM TOOTH EXTRACTION  age 89 & 81   lower teeth done first and upper wisdom teeth last   Patient Active Problem List   Diagnosis Date Noted   Acute medial meniscus tear of left knee 07/30/2022   Elevated alkaline phosphatase measurement 09/26/2014   Renal cyst, left 12/31/2013   Osteopenia 12/31/2013   Vitamin D deficiency 12/31/2013   Postmenopausal atrophic  vaginitis 12/31/2013   Family history of colon cancer 10/02/2013   Wrist pain, right 05/13/2013   Hx of fall 05/13/2013   Hypercholesteremia 05/13/2013   History of kidney stones    Musculoskeletal leg pain 02/02/2013   Gait abnormality 02/02/2013   Chest discomfort 05/28/2011   Left wrist injury 12/01/2010   Renal cyst 12/01/2010   Cough 09/27/2010   Neck pain 08/29/2010   Tingling sensation 08/29/2010   Allergic rhinitis, seasonal 08/29/2010   LEG PAIN, RIGHT 07/01/2009   VARICOSE VEINS LOWER EXTREMITIES W/OTH COMPS 12/06/2008   BACK PAIN, THORACIC REGION 12/06/2008   BACK PAIN 11/10/2008   BRACHIAL NEURITIS OR RADICULITIS NOS 10/29/2008   Irritable bowel syndrome 04/23/2007   HYPERLIPIDEMIA NEC/NOS 01/10/2007   DEPRESSION 01/08/2007   GERD 01/08/2007   Diverticulosis of colon (without mention of hemorrhage) 07/28/2002     REFERRING PROVIDER: Huel Cote, MD   REFERRING DIAG: M23.92 (ICD-10-CM) - Acute internal derangement of left knee     S/p L knee medial meniscal root repair  THERAPY DIAG:  Left knee pain, unspecified chronicity  Stiffness of left knee, not elsewhere classified  Muscle weakness (generalized)  Difficulty in walking, not elsewhere classified  Rationale for  Evaluation and Treatment: Rehabilitation  ONSET DATE: DOS 07/30/2022   SUBJECTIVE:   SUBJECTIVE STATEMENT: Pt is 6 weeks and 1 day s/p left knee medial meniscal root repair.  Pt denies any adverse effects after prior Rx, just a little soreness.  Pt saw MD on Friday and he removed her brace.  He spoke to her about aquatic therapy due to stiffness.  Pt has been ambulating without brace with cane.  She did have soreness initially though not now.  Pt's sister reports sometimes she forgets to use her cane in her home.  Pt reports compliance with HEP and she has no adverse effects with HEP.  Pt reports having some soreness in R knee which she thinks is from compensation though hasn't been sore this  week.      PERTINENT HISTORY: -Status post left knee medial meniscal root repair 07/30/2022.  -MRI findings of L knee moderate medial compartment deg changes -Pt has a Hx of Baker's cyst.   -Osteoporosis, OA, and lumbar pain  PAIN:  Are you having pain? Yes NPRS:  0/10 current, 4/10 worst, 0/10 best Location:  L knee  PRECAUTIONS: Other: per surgical protocol and Wb'ing restrictions  WEIGHT BEARING RESTRICTIONS: Yes Advance WB'ing  FALLS:  Has patient fallen in last 6 months? No  LIVING ENVIRONMENT: Lives with: lives alone Lives in: 1 story home Stairs: ramp to enter home.  Pt has 1 step in home with a rail.  Has following equipment at home:  FWW.  Sister has a cane.  OCCUPATION: Pt is retired  PLOF: Independent.  Pt was able to perform her normal functional mobility skills and daily activities independently without difficulty.  Pt ambulated without an AD.  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    PATIENT GOALS: return to [facility]  Next MD visit: 10/12/2022   OBJECTIVE:   DIAGNOSTIC FINDINGS: Pt is post op.  MRI before surgery also showed mod medial compartment deg changes with mild patellofemoral and lateral compartment deg chondrosis and a tiny Baker's cyst.    TODAY'S TREATMENT:    LOWER EXTREMITY ROM:  L knee flexion AROM:  96 deg    -Pt performed: Heel raises 3x10 TKE with YTB 2x10 Supine knee flexion AAROM with strap  2 x 10 reps Supine heel slides x 10 reps Supine SLR x10 reps x12 with 1# and with 2# x10 S/L hip abd 2x12 with 2# Longsitting gastroc stretch 3x30 sec  -Pt received L knee flexion and extension PROM per pt/tissue tolerance within protocol range in supine.    Gait: Pt ambulated with cane and without brace.  She has decreased TKE and heel strike and improved toe off.  Still has decreased flexion.  PT provided instruction and cuing for toe off and knee flexion and heel strike and TKE.       -See below  for pt education                                                                                             PATIENT EDUCATION:  Education details: HEP, Exercise form,  relevant anatomy, POC, gait, brace, post op and protocol restrictions, and MD orders.  Person educated: Patient and sister Education method: Explanation, Demonstration, Tactile cues, Verbal cues, and Handouts Education comprehension: verbalized understanding, returned demonstration, verbal cues required, tactile cues required, and needs further education  HOME EXERCISE PROGRAM: Access Code: [email or web address] URL: [email or web address] Date: 08/02/2022 Prepared by: Aaron Edelman  Exercises - Supine Quadricep Sets  - 2 x daily - 7 x weekly - 2 sets - 10 reps - 5 seconds hold - ANKLE PUMPS   20-30 every hour  Supine Active Straight Leg Raise  - 1 x daily - 7 x weekly - 2 sets - 10 reps - Sitting Heel Slide with hands under thigh  - 2 x daily - 7 x weekly - 2 sets - 10 reps - Supine Heel Slide with Strap  - 2 x daily - 7 x weekly - 2 sets - 10 reps - Long Sitting Ankle Plantar Flexion with Resistance  - 1 x daily - 7 x weekly - 2-3 sets - 10 reps - Sidelying Hip Abduction  - 1 x daily - 7 x weekly - 2 sets - 10 reps - Sidelying Hip Adduction  - 1 x daily - 7 x weekly - 2 sets - 10 reps - Supine heel slides 2x/day, 1-2 sets of 10 reps  ASSESSMENT:  CLINICAL IMPRESSION: Pt continues to improve with Wb'ing.  She has been ambulating with SPC and MD recently removed her brace.  She is ambulating with good stability without increased pain.  Pt demonstrated decreased heel strike and TKE with gait today.  PT worked on gait and Pt demonstrates improved gait with instruction and cuing.  Pt continues to make steady progress with knee flexion ROM and demonstrates improved flexion AROM as evidenced by goniometric measurements.  She has tightness in knee flexion ROM which improves with increased reps of PROM and AA/AROM.  Pt has full  knee extension ROM.  Pt performed exercises per protocol well with cuing for correct form.  She demonstrates good form with supine SLR.  She responded well to Rx having no pain after Rx.  Pt should benefit from continued skilled PT services per protocol to address goals and impairments and to improve overall function.     OBJECTIVE IMPAIRMENTS: Abnormal gait, decreased activity tolerance, decreased endurance, decreased knowledge of use of DME, decreased mobility, difficulty walking, decreased ROM, decreased strength, hypomobility, increased edema, impaired flexibility, and pain.   ACTIVITY LIMITATIONS: bending, standing, squatting, stairs, transfers, bed mobility, dressing, and locomotion level  PARTICIPATION LIMITATIONS: meal prep, cleaning, laundry, driving, shopping, community activity, and volunteer work  PERSONAL FACTORS: 3+ comorbidities: moderate deg changes, osteoporosis, and lumbar pain  are also affecting patient's functional outcome.   REHAB POTENTIAL: Good  CLINICAL DECISION MAKING: Stable/uncomplicated  EVALUATION COMPLEXITY: Low   GOALS:  SHORT TERM GOALS: Target date: 08/30/2022  Pt will be independent and compliant with HEP for improved pain, ROM, strength, and function.  Baseline: Goal status: GOAL MET  2.  Pt will progress with Wb'ing per MD orders without adverse effects for improved mobility.  Baseline:  Goal status: GOAL MET  3.  Pt will demo L knee flexion PROM to be 90 deg for improved stiffness and mobility.  Baseline:  Goal status: INITIAL  4.  Pt will demo a good quad set and perform a supine SLR independently without significant extensor lag for improved strength.  Baseline:  Goal status: INITIAL  5.  Pt will be able to  perform a 6 inch step up with good form and control.  Baseline:  Goal status: INITIAL Target date:  10/11/2022  6.  Pt will ambulate with a normalized heel to toe gait pattern without AD.  Baseline:  Goal status: INITIAL Target  date:  10/25/2022  7.  Pt will demo L knee AROM to 0 - 115 deg for improved mobility and stiffness.  Baseline:  Goal status: INITIAL Target date:  10/25/2022   LONG TERM GOALS: Target date: 11/22/2022   Pt will ambulate extended community distance without difficulty and significant pain.  Baseline:  Goal status: INITIAL  2.  Pt will be able to perform stairs with a reciprocal gait with the rail.   Baseline:  Goal status: INITIAL  3.  Pt will be able to perform her ADLs and IADLs without significant pain and difficulty.  Baseline:  Goal status: INITIAL  4.  Pt will be able to perform her volunteer work without adverse effects.  Baseline:  Goal status: INITIAL  5.  Pt will demo 5/5 strength in L hip flex and abd and at least 4+/5 strength in L knee flex and extension for improved performance of and tolerance with functional mobility.  Baseline:  Goal status: INITIAL     PLAN:  PT FREQUENCY:  1x/wk x 4 weeks and 2x/wk afterwards  PT DURATION: other: 16 weeks  PLANNED INTERVENTIONS: Therapeutic exercises, Therapeutic activity, Neuromuscular re-education, Balance training, Gait training, Patient/Family education, Self Care, Joint mobilization, Stair training, Aquatic Therapy, Dry Needling, Electrical stimulation, Cryotherapy, Moist heat, scar mobilization, Taping, Ultrasound, Manual therapy, and Re-evaluation  PLAN FOR NEXT SESSION:  Cont per Dr. Serena Croissant meniscus repair protocol.  Work on improving flexion ROM and monitor extension ROM.  ATC sent message to PT from MD concerning pt starting aquatic therapy.  PN next visit.   Audie Clear III PT, DPT 09/12/22 9:23 PM

## 2022-09-13 ENCOUNTER — Ambulatory Visit (HOSPITAL_BASED_OUTPATIENT_CLINIC_OR_DEPARTMENT_OTHER): Payer: Medicare PPO | Admitting: Physical Therapy

## 2022-09-13 ENCOUNTER — Encounter (HOSPITAL_BASED_OUTPATIENT_CLINIC_OR_DEPARTMENT_OTHER): Payer: Self-pay | Admitting: Physical Therapy

## 2022-09-13 DIAGNOSIS — M6281 Muscle weakness (generalized): Secondary | ICD-10-CM | POA: Diagnosis not present

## 2022-09-13 DIAGNOSIS — M25662 Stiffness of left knee, not elsewhere classified: Secondary | ICD-10-CM

## 2022-09-13 DIAGNOSIS — R262 Difficulty in walking, not elsewhere classified: Secondary | ICD-10-CM

## 2022-09-13 DIAGNOSIS — M25562 Pain in left knee: Secondary | ICD-10-CM

## 2022-09-13 NOTE — Therapy (Signed)
OUTPATIENT PHYSICAL THERAPY TREATMENT NOTE   Patient Name: [name] [name] MRN: [id number] DOB:Apr 23, 1956, 67 y.o., female Today's Date: 09/13/2022  END OF SESSION:  PT End of Session - 09/13/22 1501     Visit Number 10    Number of Visits 28    Date for PT Re-Evaluation 10/25/22    Authorization Type [employer] MCR    PT Start Time 1502    PT Stop Time 1542    PT Time Calculation (min) 40 min    Activity Tolerance Patient tolerated treatment well    Behavior During Therapy WFL for tasks assessed/performed                  Past Medical History:  Diagnosis Date   Arthritis    Bunion    left foot   Depression    stable on medication   Diverticulitis 2015   Diverticulitis of colon without hemorrhage 09/28/2013   Diverticulosis    Gallstones    GERD (gastroesophageal reflux disease)    Hammer toes of both feet    Hyperlipidemia    no meds needed   IBS (irritable bowel syndrome)    Meniscal injury    Tear   Onychomycosis of toenail    Osteopenia    Osteoporosis    PUD (peptic ulcer disease) 1980's   Renal cyst    4.5-6 cm stable single  seen by uro in past   Past Surgical History:  Procedure Laterality Date   CHOLECYSTECTOMY  1987   COLONOSCOPY     IRRIGATION AND DEBRIDEMENT SEBACEOUS CYST Right 3/ 2014   chest wall   KNEE ARTHROSCOPY WITH MENISCAL REPAIR Left 07/30/2022   Procedure: LEFT KNEE ARTHROSCOPY WITH MEDIAL MENISCAL ROOT REPAIR;  Surgeon: Huel Cote, MD;  Location: [facility];  Service: Orthopedics;  Laterality: Left;   UPPER GASTROINTESTINAL ENDOSCOPY     WISDOM TOOTH EXTRACTION  age 53 & 79   lower teeth done first and upper wisdom teeth last   Patient Active Problem List   Diagnosis Date Noted   Acute medial meniscus tear of left knee 07/30/2022   Elevated alkaline phosphatase measurement 09/26/2014   Renal cyst, left 12/31/2013   Osteopenia 12/31/2013   Vitamin D deficiency 12/31/2013   Postmenopausal atrophic  vaginitis 12/31/2013   Family history of colon cancer 10/02/2013   Wrist pain, right 05/13/2013   Hx of fall 05/13/2013   Hypercholesteremia 05/13/2013   History of kidney stones    Musculoskeletal leg pain 02/02/2013   Gait abnormality 02/02/2013   Chest discomfort 05/28/2011   Left wrist injury 12/01/2010   Renal cyst 12/01/2010   Cough 09/27/2010   Neck pain 08/29/2010   Tingling sensation 08/29/2010   Allergic rhinitis, seasonal 08/29/2010   LEG PAIN, RIGHT 07/01/2009   VARICOSE VEINS LOWER EXTREMITIES W/OTH COMPS 12/06/2008   BACK PAIN, THORACIC REGION 12/06/2008   BACK PAIN 11/10/2008   BRACHIAL NEURITIS OR RADICULITIS NOS 10/29/2008   Irritable bowel syndrome 04/23/2007   HYPERLIPIDEMIA NEC/NOS 01/10/2007   DEPRESSION 01/08/2007   GERD 01/08/2007   Diverticulosis of colon (without mention of hemorrhage) 07/28/2002     REFERRING PROVIDER: Huel Cote, MD   REFERRING DIAG: M23.92 (ICD-10-CM) - Acute internal derangement of left knee     S/p L knee medial meniscal root repair  THERAPY DIAG:  Left knee pain, unspecified chronicity  Stiffness of left knee, not elsewhere classified  Muscle weakness (generalized)  Difficulty in walking, not elsewhere classified  Rationale for  Evaluation and Treatment: Rehabilitation  ONSET DATE: DOS 07/30/2022  Progress Note Reporting Period 08/02/22 to 09/13/2022   See note below for Objective Data and Assessment of Progress/Goals.     SUBJECTIVE:   SUBJECTIVE STATEMENT: Getting a little sore now that I am not wearing the brace. Have a hard time bending my knees to sit. I can get into the front of the car now by bending it in.    PERTINENT HISTORY: -Status post left knee medial meniscal root repair 07/30/2022.  -MRI findings of L knee moderate medial compartment deg changes -Pt has a Hx of Baker's cyst.   -Osteoporosis, OA, and lumbar pain  PAIN:  Are you having pain? Yes NPRS:  0/10 current Location:  L  knee  PRECAUTIONS: Other: per surgical protocol and Wb'ing restrictions  WEIGHT BEARING RESTRICTIONS: Yes Advance WB'ing  FALLS:  Has patient fallen in last 6 months? No  LIVING ENVIRONMENT: Lives with: lives alone Lives in: 1 story home Stairs: ramp to enter home.  Pt has 1 step in home with a rail.  Has following equipment at home:  FWW.  Sister has a cane.  OCCUPATION: Pt is retired  PLOF: Independent.  Pt was able to perform her normal functional mobility skills and daily activities independently without difficulty.  Pt ambulated without an AD.  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    PATIENT GOALS: return to [facility]  Next MD visit: 10/12/2022   OBJECTIVE:   DIAGNOSTIC FINDINGS: Pt is post op.  MRI before surgery also showed mod medial compartment deg changes with mild patellofemoral and lateral compartment deg chondrosis and a tiny Baker's cyst.    TODAY'S TREATMENT:    Treatment                            4/25:  Nu step 4 min L3 UE & LE Supine quad set to SLR Sidelying hip circles Supine SLR neutral & ER sets of 5 not touching table Gait training- glut activation at heel strike for hip ext, knee flexion in swing through, light pressure through [facility] Self rolling  Manual patellar mobs Prone knee flexion Prone hip ext with knee slightly flexed                                                      PATIENT EDUCATION:  Education details: HEP, Exercise form, relevant anatomy, POC, gait, brace, post op and protocol restrictions, and MD orders.  Person educated: Patient and sister Education method: Explanation, Demonstration, Tactile cues, Verbal cues, and Handouts Education comprehension: verbalized understanding, returned demonstration, verbal cues required, tactile cues required, and needs further education  HOME EXERCISE PROGRAM: Access Code: [email or web address] URL: [email or web address]   ASSESSMENT:  CLINICAL  IMPRESSION: Pt is making excellent progress at this point. Decreased pressure on SPC with gait training. Will continue to benefit from skilled PT to progress through post op protocol.     OBJECTIVE IMPAIRMENTS: Abnormal gait, decreased activity tolerance, decreased endurance, decreased knowledge of use of DME, decreased mobility, difficulty walking, decreased ROM, decreased strength, hypomobility, increased edema, impaired flexibility, and pain.   ACTIVITY LIMITATIONS: bending, standing, squatting, stairs, transfers, bed mobility, dressing, and locomotion level  PARTICIPATION LIMITATIONS: meal prep, cleaning, laundry, driving, shopping, community  activity, and volunteer work  PERSONAL FACTORS: 3+ comorbidities: moderate deg changes, osteoporosis, and lumbar pain  are also affecting patient's functional outcome.   REHAB POTENTIAL: Good  CLINICAL DECISION MAKING: Stable/uncomplicated  EVALUATION COMPLEXITY: Low   GOALS:  SHORT TERM GOALS: Target date: 08/30/2022  Pt will be independent and compliant with HEP for improved pain, ROM, strength, and function.  Baseline: Goal status: GOAL MET  2.  Pt will progress with Wb'ing per MD orders without adverse effects for improved mobility.  Baseline:  Goal status: GOAL MET  3.  Pt will demo L knee flexion PROM to be 90 deg for improved stiffness and mobility.  Baseline:  Goal status: achieved   4.  Pt will demo a good quad set and perform a supine SLR independently without significant extensor lag for improved strength.  Baseline:  Goal status:achieved  5.  Pt will be able to perform a 6 inch step up with good form and control.  Baseline:  Goal status: INITIAL Target date:  10/11/2022  6.  Pt will ambulate with a normalized heel to toe gait pattern without AD.  Baseline:  Goal status: INITIAL Target date:  10/25/2022  7.  Pt will demo L knee AROM to 0 - 115 deg for improved mobility and stiffness.  Baseline:  Goal status:  INITIAL Target date:  10/25/2022   LONG TERM GOALS: Target date: 11/22/2022   Pt will ambulate extended community distance without difficulty and significant pain.  Baseline:  Goal status: INITIAL  2.  Pt will be able to perform stairs with a reciprocal gait with the rail.   Baseline:  Goal status: INITIAL  3.  Pt will be able to perform her ADLs and IADLs without significant pain and difficulty.  Baseline:  Goal status: INITIAL  4.  Pt will be able to perform her volunteer work without adverse effects.  Baseline:  Goal status: INITIAL  5.  Pt will demo 5/5 strength in L hip flex and abd and at least 4+/5 strength in L knee flex and extension for improved performance of and tolerance with functional mobility.  Baseline:  Goal status: INITIAL     PLAN:  PT FREQUENCY:  1x/wk x 4 weeks and 2x/wk afterwards  PT DURATION: other: 16 weeks  PLANNED INTERVENTIONS: Therapeutic exercises, Therapeutic activity, Neuromuscular re-education, Balance training, Gait training, Patient/Family education, Self Care, Joint mobilization, Stair training, Aquatic Therapy, Dry Needling, Electrical stimulation, Cryotherapy, Moist heat, scar mobilization, Taping, Ultrasound, Manual therapy, and Re-evaluation  PLAN FOR NEXT SESSION:  Cont per Dr. Serena Croissant meniscus repair protocol.  Work on improving flexion ROM and monitor extension ROM.  ATC sent message to PT from MD concerning pt starting aquatic therapy.  PN next visit.   Audie Clear III PT, DPT 09/13/22 3:43 PM

## 2022-09-18 ENCOUNTER — Ambulatory Visit (HOSPITAL_BASED_OUTPATIENT_CLINIC_OR_DEPARTMENT_OTHER): Payer: Medicare PPO

## 2022-09-18 ENCOUNTER — Encounter (HOSPITAL_BASED_OUTPATIENT_CLINIC_OR_DEPARTMENT_OTHER): Payer: Self-pay

## 2022-09-18 DIAGNOSIS — M6281 Muscle weakness (generalized): Secondary | ICD-10-CM | POA: Diagnosis not present

## 2022-09-18 DIAGNOSIS — M25562 Pain in left knee: Secondary | ICD-10-CM | POA: Diagnosis not present

## 2022-09-18 DIAGNOSIS — M25662 Stiffness of left knee, not elsewhere classified: Secondary | ICD-10-CM

## 2022-09-18 DIAGNOSIS — R262 Difficulty in walking, not elsewhere classified: Secondary | ICD-10-CM | POA: Diagnosis not present

## 2022-09-18 NOTE — Therapy (Signed)
OUTPATIENT PHYSICAL THERAPY TREATMENT NOTE   Patient Name: [name] [name] MRN: [id number] DOB:21-Mar-1956, 67 y.o., female Today's Date: 09/18/2022  END OF SESSION:  PT End of Session - 09/18/22 1438     Visit Number 11    Number of Visits 28    Date for PT Re-Evaluation 10/25/22    Authorization Type [employer] MCR    PT Start Time 1435    PT Stop Time 1515    PT Time Calculation (min) 40 min    Activity Tolerance Patient tolerated treatment well    Behavior During Therapy WFL for tasks assessed/performed                   Past Medical History:  Diagnosis Date   Arthritis    Bunion    left foot   Depression    stable on medication   Diverticulitis 2015   Diverticulitis of colon without hemorrhage 09/28/2013   Diverticulosis    Gallstones    GERD (gastroesophageal reflux disease)    Hammer toes of both feet    Hyperlipidemia    no meds needed   IBS (irritable bowel syndrome)    Meniscal injury    Tear   Onychomycosis of toenail    Osteopenia    Osteoporosis    PUD (peptic ulcer disease) 1980's   Renal cyst    4.5-6 cm stable single  seen by uro in past   Past Surgical History:  Procedure Laterality Date   CHOLECYSTECTOMY  1987   COLONOSCOPY     IRRIGATION AND DEBRIDEMENT SEBACEOUS CYST Right 3/ 2014   chest wall   KNEE ARTHROSCOPY WITH MENISCAL REPAIR Left 07/30/2022   Procedure: LEFT KNEE ARTHROSCOPY WITH MEDIAL MENISCAL ROOT REPAIR;  Surgeon: Huel Cote, MD;  Location: [facility];  Service: Orthopedics;  Laterality: Left;   UPPER GASTROINTESTINAL ENDOSCOPY     WISDOM TOOTH EXTRACTION  age 76 & 66   lower teeth done first and upper wisdom teeth last   Patient Active Problem List   Diagnosis Date Noted   Acute medial meniscus tear of left knee 07/30/2022   Elevated alkaline phosphatase measurement 09/26/2014   Renal cyst, left 12/31/2013   Osteopenia 12/31/2013   Vitamin D deficiency 12/31/2013   Postmenopausal atrophic  vaginitis 12/31/2013   Family history of colon cancer 10/02/2013   Wrist pain, right 05/13/2013   Hx of fall 05/13/2013   Hypercholesteremia 05/13/2013   History of kidney stones    Musculoskeletal leg pain 02/02/2013   Gait abnormality 02/02/2013   Chest discomfort 05/28/2011   Left wrist injury 12/01/2010   Renal cyst 12/01/2010   Cough 09/27/2010   Neck pain 08/29/2010   Tingling sensation 08/29/2010   Allergic rhinitis, seasonal 08/29/2010   LEG PAIN, RIGHT 07/01/2009   VARICOSE VEINS LOWER EXTREMITIES W/OTH COMPS 12/06/2008   BACK PAIN, THORACIC REGION 12/06/2008   BACK PAIN 11/10/2008   BRACHIAL NEURITIS OR RADICULITIS NOS 10/29/2008   Irritable bowel syndrome 04/23/2007   HYPERLIPIDEMIA NEC/NOS 01/10/2007   DEPRESSION 01/08/2007   GERD 01/08/2007   Diverticulosis of colon (without mention of hemorrhage) 07/28/2002     REFERRING PROVIDER: Huel Cote, MD   REFERRING DIAG: M23.92 (ICD-10-CM) - Acute internal derangement of left knee     S/p L knee medial meniscal root repair  THERAPY DIAG:  Left knee pain, unspecified chronicity  Muscle weakness (generalized)  Difficulty in walking, not elsewhere classified  Stiffness of left knee, not elsewhere classified  Rationale  for Evaluation and Treatment: Rehabilitation  ONSET DATE: DOS 07/30/2022   SUBJECTIVE:   SUBJECTIVE STATEMENT: Getting a little sore now that I am not wearing the brace. Have a hard time bending my knees to sit. I can get into the front of the car now by bending it in.    PERTINENT HISTORY: -Status post left knee medial meniscal root repair 07/30/2022.  -MRI findings of L knee moderate medial compartment deg changes -Pt has a Hx of Baker's cyst.   -Osteoporosis, OA, and lumbar pain  PAIN:  Are you having pain? Yes NPRS:  0/10 current Location:  L knee  PRECAUTIONS: Other: per surgical protocol and Wb'ing restrictions  WEIGHT BEARING RESTRICTIONS: Yes Advance WB'ing  FALLS:  Has  patient fallen in last 6 months? No  LIVING ENVIRONMENT: Lives with: lives alone Lives in: 1 story home Stairs: ramp to enter home.  Pt has 1 step in home with a rail.  Has following equipment at home:  FWW.  Sister has a cane.  OCCUPATION: Pt is retired  PLOF: Independent.  Pt was able to perform her normal functional mobility skills and daily activities independently without difficulty.  Pt ambulated without an AD.  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    PATIENT GOALS: return to [facility]  Next MD visit: 10/12/2022   OBJECTIVE:   DIAGNOSTIC FINDINGS: Pt is post op.  MRI before surgery also showed mod medial compartment deg changes with mild patellofemoral and lateral compartment deg chondrosis and a tiny Baker's cyst.    TODAY'S TREATMENT:   Treatment                            4/30:  Nu step 4 min L3 LEs Supine quad set to SLR Sidelying hip circles Supine SLR neutral & ER sets of 5 not touching table Gait training- glut activation at heel strike for hip ext, knee flexion in swing through, light pressure through [facility] Self rolling  Manual patellar mobs Prone knee flexion Prone hip ext with knee slightly flexed x20ea   Treatment                            4/25:  Nu step 4 min L3 UE & LE L knee PROM IASTM using roller to quads Supine quad set to SLR 2x10 Quad set x20 Sidelying hip circles x20ea Prone knee flexion 2x10 Prone hip ext 2x10 Standing H 2x10 Standing marches 2x10 Standing HSC 2x10                                                      PATIENT EDUCATION:  Education details: HEP, Exercise form, relevant anatomy, POC, gait, brace, post op and protocol restrictions, and MD orders.  Person educated: Patient and sister Education method: Explanation, Demonstration, Tactile cues, Verbal cues, and Handouts Education comprehension: verbalized understanding, returned demonstration, verbal cues required, tactile cues required,  and needs further education  HOME EXERCISE PROGRAM: Access Code: [email or web address] URL: [email or web address]   ASSESSMENT:  CLINICAL IMPRESSION: Pt 7 weeks s/p. Remains limited in knee flexion, but able to increase this slightly after IASTM using roller to quads. No complaints with standing exs aside from some calf tightness. Added  standing calf stretch to address this.     OBJECTIVE IMPAIRMENTS: Abnormal gait, decreased activity tolerance, decreased endurance, decreased knowledge of use of DME, decreased mobility, difficulty walking, decreased ROM, decreased strength, hypomobility, increased edema, impaired flexibility, and pain.   ACTIVITY LIMITATIONS: bending, standing, squatting, stairs, transfers, bed mobility, dressing, and locomotion level  PARTICIPATION LIMITATIONS: meal prep, cleaning, laundry, driving, shopping, community activity, and volunteer work  PERSONAL FACTORS: 3+ comorbidities: moderate deg changes, osteoporosis, and lumbar pain  are also affecting patient's functional outcome.   REHAB POTENTIAL: Good  CLINICAL DECISION MAKING: Stable/uncomplicated  EVALUATION COMPLEXITY: Low   GOALS:  SHORT TERM GOALS: Target date: 08/30/2022  Pt will be independent and compliant with HEP for improved pain, ROM, strength, and function.  Baseline: Goal status: GOAL MET  2.  Pt will progress with Wb'ing per MD orders without adverse effects for improved mobility.  Baseline:  Goal status: GOAL MET  3.  Pt will demo L knee flexion PROM to be 90 deg for improved stiffness and mobility.  Baseline:  Goal status: achieved   4.  Pt will demo a good quad set and perform a supine SLR independently without significant extensor lag for improved strength.  Baseline:  Goal status:achieved  5.  Pt will be able to perform a 6 inch step up with good form and control.  Baseline:  Goal status: INITIAL Target date:  10/11/2022  6.  Pt will ambulate with a normalized heel to toe  gait pattern without AD.  Baseline:  Goal status: INITIAL Target date:  10/25/2022  7.  Pt will demo L knee AROM to 0 - 115 deg for improved mobility and stiffness.  Baseline:  Goal status: INITIAL Target date:  10/25/2022   LONG TERM GOALS: Target date: 11/22/2022   Pt will ambulate extended community distance without difficulty and significant pain.  Baseline:  Goal status: INITIAL  2.  Pt will be able to perform stairs with a reciprocal gait with the rail.   Baseline:  Goal status: INITIAL  3.  Pt will be able to perform her ADLs and IADLs without significant pain and difficulty.  Baseline:  Goal status: INITIAL  4.  Pt will be able to perform her volunteer work without adverse effects.  Baseline:  Goal status: INITIAL  5.  Pt will demo 5/5 strength in L hip flex and abd and at least 4+/5 strength in L knee flex and extension for improved performance of and tolerance with functional mobility.  Baseline:  Goal status: INITIAL     PLAN:  PT FREQUENCY:  1x/wk x 4 weeks and 2x/wk afterwards  PT DURATION: other: 16 weeks  PLANNED INTERVENTIONS: Therapeutic exercises, Therapeutic activity, Neuromuscular re-education, Balance training, Gait training, Patient/Family education, Self Care, Joint mobilization, Stair training, Aquatic Therapy, Dry Needling, Electrical stimulation, Cryotherapy, Moist heat, scar mobilization, Taping, Ultrasound, Manual therapy, and Re-evaluation  PLAN FOR NEXT SESSION:  Cont per Dr. Serena Croissant meniscus repair protocol.  Work on improving flexion ROM and monitor extension ROM.  ATC sent message to PT from MD concerning pt starting aquatic therapy.  PN next visit.  Riki Altes, PTA  09/18/22 3:51 PM

## 2022-09-19 DIAGNOSIS — F3341 Major depressive disorder, recurrent, in partial remission: Secondary | ICD-10-CM | POA: Diagnosis not present

## 2022-09-20 ENCOUNTER — Encounter (HOSPITAL_BASED_OUTPATIENT_CLINIC_OR_DEPARTMENT_OTHER): Payer: Self-pay | Admitting: Physical Therapy

## 2022-09-20 ENCOUNTER — Ambulatory Visit (HOSPITAL_BASED_OUTPATIENT_CLINIC_OR_DEPARTMENT_OTHER): Payer: Medicare PPO | Attending: Orthopaedic Surgery | Admitting: Physical Therapy

## 2022-09-20 DIAGNOSIS — M25562 Pain in left knee: Secondary | ICD-10-CM

## 2022-09-20 DIAGNOSIS — R262 Difficulty in walking, not elsewhere classified: Secondary | ICD-10-CM | POA: Diagnosis not present

## 2022-09-20 DIAGNOSIS — M25662 Stiffness of left knee, not elsewhere classified: Secondary | ICD-10-CM | POA: Diagnosis not present

## 2022-09-20 DIAGNOSIS — M6281 Muscle weakness (generalized): Secondary | ICD-10-CM | POA: Diagnosis not present

## 2022-09-20 NOTE — Therapy (Signed)
OUTPATIENT PHYSICAL THERAPY TREATMENT NOTE   Patient Name: [name] [name] MRN: [id number] DOB:1955/10/31, 67 y.o., female Today's Date: 09/20/2022  END OF SESSION:  PT End of Session - 09/20/22 1431     Visit Number 12    Number of Visits 28    Date for PT Re-Evaluation 10/25/22    Authorization Type [employer] MCR    PT Start Time 1430    PT Stop Time 1513    PT Time Calculation (min) 43 min    Activity Tolerance Patient tolerated treatment well    Behavior During Therapy WFL for tasks assessed/performed                   Past Medical History:  Diagnosis Date   Arthritis    Bunion    left foot   Depression    stable on medication   Diverticulitis 2015   Diverticulitis of colon without hemorrhage 09/28/2013   Diverticulosis    Gallstones    GERD (gastroesophageal reflux disease)    Hammer toes of both feet    Hyperlipidemia    no meds needed   IBS (irritable bowel syndrome)    Meniscal injury    Tear   Onychomycosis of toenail    Osteopenia    Osteoporosis    PUD (peptic ulcer disease) 1980's   Renal cyst    4.5-6 cm stable single  seen by uro in past   Past Surgical History:  Procedure Laterality Date   CHOLECYSTECTOMY  1987   COLONOSCOPY     IRRIGATION AND DEBRIDEMENT SEBACEOUS CYST Right 3/ 2014   chest wall   KNEE ARTHROSCOPY WITH MENISCAL REPAIR Left 07/30/2022   Procedure: LEFT KNEE ARTHROSCOPY WITH MEDIAL MENISCAL ROOT REPAIR;  Surgeon: Huel Cote, MD;  Location: [facility];  Service: Orthopedics;  Laterality: Left;   UPPER GASTROINTESTINAL ENDOSCOPY     WISDOM TOOTH EXTRACTION  age 14 & 12   lower teeth done first and upper wisdom teeth last   Patient Active Problem List   Diagnosis Date Noted   Acute medial meniscus tear of left knee 07/30/2022   Elevated alkaline phosphatase measurement 09/26/2014   Renal cyst, left 12/31/2013   Osteopenia 12/31/2013   Vitamin D deficiency 12/31/2013   Postmenopausal atrophic  vaginitis 12/31/2013   Family history of colon cancer 10/02/2013   Wrist pain, right 05/13/2013   Hx of fall 05/13/2013   Hypercholesteremia 05/13/2013   History of kidney stones    Musculoskeletal leg pain 02/02/2013   Gait abnormality 02/02/2013   Chest discomfort 05/28/2011   Left wrist injury 12/01/2010   Renal cyst 12/01/2010   Cough 09/27/2010   Neck pain 08/29/2010   Tingling sensation 08/29/2010   Allergic rhinitis, seasonal 08/29/2010   LEG PAIN, RIGHT 07/01/2009   VARICOSE VEINS LOWER EXTREMITIES W/OTH COMPS 12/06/2008   BACK PAIN, THORACIC REGION 12/06/2008   BACK PAIN 11/10/2008   BRACHIAL NEURITIS OR RADICULITIS NOS 10/29/2008   Irritable bowel syndrome 04/23/2007   HYPERLIPIDEMIA NEC/NOS 01/10/2007   DEPRESSION 01/08/2007   GERD 01/08/2007   Diverticulosis of colon (without mention of hemorrhage) 07/28/2002     REFERRING PROVIDER: Huel Cote, MD   REFERRING DIAG: M23.92 (ICD-10-CM) - Acute internal derangement of left knee     S/p L knee medial meniscal root repair  THERAPY DIAG:  Left knee pain, unspecified chronicity  Muscle weakness (generalized)  Difficulty in walking, not elsewhere classified  Stiffness of left knee, not elsewhere classified  Rationale  for Evaluation and Treatment: Rehabilitation  ONSET DATE: DOS 07/30/2022   SUBJECTIVE:   SUBJECTIVE STATEMENT: Denies pain, doing well. Biggest complaint is periodic stiffness.    PERTINENT HISTORY: -Status post left knee medial meniscal root repair 07/30/2022.  -MRI findings of L knee moderate medial compartment deg changes -Pt has a Hx of Baker's cyst.   -Osteoporosis, OA, and lumbar pain  PAIN:  Are you having pain? Yes NPRS:  0/10 current Location:  L knee  PRECAUTIONS: Other: per surgical protocol and Wb'ing restrictions  WEIGHT BEARING RESTRICTIONS: Yes Advance WB'ing  FALLS:  Has patient fallen in last 6 months? No  LIVING ENVIRONMENT: Lives with: lives alone Lives  in: 1 story home Stairs: ramp to enter home.  Pt has 1 step in home with a rail.  Has following equipment at home:  FWW.  Sister has a cane.  OCCUPATION: Pt is retired  PLOF: Independent.  Pt was able to perform her normal functional mobility skills and daily activities independently without difficulty.  Pt ambulated without an AD.  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    PATIENT GOALS: return to [facility]  Next MD visit: 10/12/2022   OBJECTIVE:   DIAGNOSTIC FINDINGS: Pt is post op.  MRI before surgery also showed mod medial compartment deg changes with mild patellofemoral and lateral compartment deg chondrosis and a tiny Baker's cyst.    TODAY'S TREATMENT:   Treatment                            5/2:  Manual: rolling to thigh, patellar mobs SLR 5x5 without touching table Manual: prone HS rolling, gastroc rolling, Lt peroneal STM Prone HS curl- quick flex & slow extend Standing gastroc & soleus stretch Heel raises in turnout Gait training- holding SPC rather than leaning which reduced Lt trendelenburg SLS balance Seated HSS  Treatment                            4/30:  Nu step 4 min L3 LEs Supine quad set to SLR Sidelying hip circles Supine SLR neutral & ER sets of 5 not touching table Gait training- glut activation at heel strike for hip ext, knee flexion in swing through, light pressure through [facility] Self rolling  Manual patellar mobs Prone knee flexion Prone hip ext with knee slightly flexed x20ea   Treatment                            4/25:  Nu step 4 min L3 UE & LE L knee PROM IASTM using roller to quads Supine quad set to SLR 2x10 Quad set x20 Sidelying hip circles x20ea Prone knee flexion 2x10 Prone hip ext 2x10 Standing H 2x10 Standing marches 2x10 Standing HSC 2x10                                                      PATIENT EDUCATION:  Education details: HEP, Exercise form, relevant anatomy, POC, gait, brace, post  op and protocol restrictions, and MD orders.  Person educated: Patient and sister Education method: Explanation, Demonstration, Tactile cues, Verbal cues, and Handouts Education comprehension: verbalized understanding, returned demonstration,  verbal cues required, tactile cues required, and needs further education  HOME EXERCISE PROGRAM: Access Code: [email or web address] URL: [email or web address]   ASSESSMENT:  CLINICAL IMPRESSION: Excellent progression in gait. Carried [facility] rather than placing it on the ground to reduce trendelenburg. Pt denied increase in pain. Began introducing single leg balance asn advised this may make her knee a little more sore.     OBJECTIVE IMPAIRMENTS: Abnormal gait, decreased activity tolerance, decreased endurance, decreased knowledge of use of DME, decreased mobility, difficulty walking, decreased ROM, decreased strength, hypomobility, increased edema, impaired flexibility, and pain.   ACTIVITY LIMITATIONS: bending, standing, squatting, stairs, transfers, bed mobility, dressing, and locomotion level  PARTICIPATION LIMITATIONS: meal prep, cleaning, laundry, driving, shopping, community activity, and volunteer work  PERSONAL FACTORS: 3+ comorbidities: moderate deg changes, osteoporosis, and lumbar pain  are also affecting patient's functional outcome.   REHAB POTENTIAL: Good  CLINICAL DECISION MAKING: Stable/uncomplicated  EVALUATION COMPLEXITY: Low   GOALS:  SHORT TERM GOALS: Target date: 08/30/2022  Pt will be independent and compliant with HEP for improved pain, ROM, strength, and function.  Baseline: Goal status: GOAL MET  2.  Pt will progress with Wb'ing per MD orders without adverse effects for improved mobility.  Baseline:  Goal status: GOAL MET  3.  Pt will demo L knee flexion PROM to be 90 deg for improved stiffness and mobility.  Baseline:  Goal status: achieved   4.  Pt will demo a good quad set and perform a supine SLR  independently without significant extensor lag for improved strength.  Baseline:  Goal status:achieved  5.  Pt will be able to perform a 6 inch step up with good form and control.  Baseline:  Goal status: INITIAL Target date:  10/11/2022  6.  Pt will ambulate with a normalized heel to toe gait pattern without AD.  Baseline:  Goal status: INITIAL Target date:  10/25/2022  7.  Pt will demo L knee AROM to 0 - 115 deg for improved mobility and stiffness.  Baseline:  Goal status: INITIAL Target date:  10/25/2022   LONG TERM GOALS: Target date: 11/22/2022   Pt will ambulate extended community distance without difficulty and significant pain.  Baseline:  Goal status: INITIAL  2.  Pt will be able to perform stairs with a reciprocal gait with the rail.   Baseline:  Goal status: INITIAL  3.  Pt will be able to perform her ADLs and IADLs without significant pain and difficulty.  Baseline:  Goal status: INITIAL  4.  Pt will be able to perform her volunteer work without adverse effects.  Baseline:  Goal status: INITIAL  5.  Pt will demo 5/5 strength in L hip flex and abd and at least 4+/5 strength in L knee flex and extension for improved performance of and tolerance with functional mobility.  Baseline:  Goal status: INITIAL     PLAN:  PT FREQUENCY:  1x/wk x 4 weeks and 2x/wk afterwards  PT DURATION: other: 16 weeks  PLANNED INTERVENTIONS: Therapeutic exercises, Therapeutic activity, Neuromuscular re-education, Balance training, Gait training, Patient/Family education, Self Care, Joint mobilization, Stair training, Aquatic Therapy, Dry Needling, Electrical stimulation, Cryotherapy, Moist heat, scar mobilization, Taping, Ultrasound, Manual therapy, and Re-evaluation  PLAN FOR NEXT SESSION:  Cont per Dr. Serena Croissant meniscus repair protocol.  Work on improving flexion ROM and monitor extension ROM.  ATC sent message to PT from MD concerning pt starting aquatic therapy.  PN next  visit.  Yohan Samons C. Veida Spira PT, DPT 09/20/22 3:16 PM

## 2022-09-25 ENCOUNTER — Ambulatory Visit (HOSPITAL_BASED_OUTPATIENT_CLINIC_OR_DEPARTMENT_OTHER): Payer: Medicare PPO | Admitting: Physical Therapy

## 2022-09-25 DIAGNOSIS — M6281 Muscle weakness (generalized): Secondary | ICD-10-CM | POA: Diagnosis not present

## 2022-09-25 DIAGNOSIS — M810 Age-related osteoporosis without current pathological fracture: Secondary | ICD-10-CM | POA: Diagnosis not present

## 2022-09-25 DIAGNOSIS — E2839 Other primary ovarian failure: Secondary | ICD-10-CM | POA: Diagnosis not present

## 2022-09-25 DIAGNOSIS — M25562 Pain in left knee: Secondary | ICD-10-CM | POA: Diagnosis not present

## 2022-09-25 DIAGNOSIS — Z780 Asymptomatic menopausal state: Secondary | ICD-10-CM | POA: Diagnosis not present

## 2022-09-25 DIAGNOSIS — M25662 Stiffness of left knee, not elsewhere classified: Secondary | ICD-10-CM | POA: Diagnosis not present

## 2022-09-25 DIAGNOSIS — R262 Difficulty in walking, not elsewhere classified: Secondary | ICD-10-CM

## 2022-09-25 DIAGNOSIS — Z13820 Encounter for screening for osteoporosis: Secondary | ICD-10-CM | POA: Diagnosis not present

## 2022-09-25 NOTE — Therapy (Signed)
OUTPATIENT PHYSICAL THERAPY TREATMENT NOTE   Patient Name: [name] [name] MRN: [id number] DOB:[birth year], 67 y.o., female Today's Date: 09/26/2022  END OF SESSION:  PT End of Session - 09/25/22 1630     Visit Number 13    Number of Visits 28    Date for PT Re-Evaluation 10/25/22    Authorization Type [employer] MCR    PT Start Time 1627    PT Stop Time 1712    PT Time Calculation (min) 45 min    Activity Tolerance Patient tolerated treatment well    Behavior During Therapy WFL for tasks assessed/performed                   Past Medical History:  Diagnosis Date   Arthritis    Bunion    left foot   Depression    stable on medication   Diverticulitis 2015   Diverticulitis of colon without hemorrhage 09/28/2013   Diverticulosis    Gallstones    GERD (gastroesophageal reflux disease)    Hammer toes of both feet    Hyperlipidemia    no meds needed   IBS (irritable bowel syndrome)    Meniscal injury    Tear   Onychomycosis of toenail    Osteopenia    Osteoporosis    PUD (peptic ulcer disease) 1980's   Renal cyst    4.5-6 cm stable single  seen by uro in past   Past Surgical History:  Procedure Laterality Date   CHOLECYSTECTOMY  1987   COLONOSCOPY     IRRIGATION AND DEBRIDEMENT SEBACEOUS CYST Right 3/ 2014   chest wall   KNEE ARTHROSCOPY WITH MENISCAL REPAIR Left 07/30/2022   Procedure: LEFT KNEE ARTHROSCOPY WITH MEDIAL MENISCAL ROOT REPAIR;  Surgeon: Huel Cote, MD;  Location: [facility];  Service: Orthopedics;  Laterality: Left;   UPPER GASTROINTESTINAL ENDOSCOPY     WISDOM TOOTH EXTRACTION  age 70 & 82   lower teeth done first and upper wisdom teeth last   Patient Active Problem List   Diagnosis Date Noted   Acute medial meniscus tear of left knee 07/30/2022   Elevated alkaline phosphatase measurement 09/26/2014   Renal cyst, left 12/31/2013   Osteopenia 12/31/2013   Vitamin D deficiency 12/31/2013   Postmenopausal atrophic  vaginitis 12/31/2013   Family history of colon cancer 10/02/2013   Wrist pain, right 05/13/2013   Hx of fall 05/13/2013   Hypercholesteremia 05/13/2013   History of kidney stones    Musculoskeletal leg pain 02/02/2013   Gait abnormality 02/02/2013   Chest discomfort 05/28/2011   Left wrist injury 12/01/2010   Renal cyst 12/01/2010   Cough 09/27/2010   Neck pain 08/29/2010   Tingling sensation 08/29/2010   Allergic rhinitis, seasonal 08/29/2010   LEG PAIN, RIGHT 07/01/2009   VARICOSE VEINS LOWER EXTREMITIES W/OTH COMPS 12/06/2008   BACK PAIN, THORACIC REGION 12/06/2008   BACK PAIN 11/10/2008   BRACHIAL NEURITIS OR RADICULITIS NOS 10/29/2008   Irritable bowel syndrome 04/23/2007   HYPERLIPIDEMIA NEC/NOS 01/10/2007   DEPRESSION 01/08/2007   GERD 01/08/2007   Diverticulosis of colon (without mention of hemorrhage) 07/28/2002     REFERRING PROVIDER: Huel Cote, MD   REFERRING DIAG: M23.92 (ICD-10-CM) - Acute internal derangement of left knee     S/p L knee medial meniscal root repair  THERAPY DIAG:  Left knee pain, unspecified chronicity  Muscle weakness (generalized)  Difficulty in walking, not elsewhere classified  Stiffness of left knee, not elsewhere classified  Rationale  for Evaluation and Treatment: Rehabilitation  ONSET DATE: DOS 07/30/2022   SUBJECTIVE:   SUBJECTIVE STATEMENT: Pt is 8 weeks and 1 days s/p L knee medial meniscal repair.  Pt denies any adverse effects after prior Rx.  Pt states she is not using the cane as much as she was.  She doesn't always use her cane at home.  Pt is still having L knee stiffness.    PERTINENT HISTORY: -Status post left knee medial meniscal root repair 07/30/2022.  -MRI findings of L knee moderate medial compartment deg changes -Pt has a Hx of Baker's cyst.   -Osteoporosis, OA, and lumbar pain  PAIN:  Are you having pain? Yes NPRS:  0/10 current Location:  L knee  PRECAUTIONS: Other: per surgical protocol and  Wb'ing restrictions  WEIGHT BEARING RESTRICTIONS: Yes Advance WB'ing  FALLS:  Has patient fallen in last 6 months? No  LIVING ENVIRONMENT: Lives with: lives alone Lives in: 1 story home Stairs: ramp to enter home.  Pt has 1 step in home with a rail.  Has following equipment at home:  FWW.  Sister has a cane.  OCCUPATION: Pt is retired  PLOF: Independent.  Pt was able to perform her normal functional mobility skills and daily activities independently without difficulty.  Pt ambulated without an AD.  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    PATIENT GOALS: return to [facility]  Next MD visit: 10/12/2022   OBJECTIVE:   DIAGNOSTIC FINDINGS: Pt is post op.  MRI before surgery also showed mod medial compartment deg changes with mild patellofemoral and lateral compartment deg chondrosis and a tiny Baker's cyst.    TODAY'S TREATMENT:    Therapeutic Exercise: Bike rocking back and forth x Standing heel raises  Supine SLR with 2# 2x10 S/L hip abd circles x10 each Prone knee flexion AROM 3x10 TKE with RTB x10 reps and GTB 2x10 reps Mini squats (0-40 deg) 2x10 with UE support Standing hip abduction L LE only 2x10 Pt received L knee flexion and extension PROM in supine per pt and tissue tolerance  L knee flexion AROM:  101 deg after PROM  Manual:  rolling quad with roller, patellar mobs  L knee flexion AROM:  104 deg after MT                                                       PATIENT EDUCATION:  Education details: HEP, protocol progression, exercise form, relevant anatomy, POC, and gait.  Person educated: Patient and sister Education method: Explanation, Demonstration, Tactile cues, Verbal cues Education comprehension: verbalized understanding, returned demonstration, verbal cues required, tactile cues required, and needs further education  HOME EXERCISE PROGRAM: Access Code: [email or web address] URL:  [email or web address]   ASSESSMENT:  CLINICAL IMPRESSION: Pt is improving with gait and reports decreased usage of the cane.  Pt demonstrates improved L knee flexion AROM.  PT progressed exercises per protocol and pt demonstrated good tolerance without c/o's.  She performed exercises per protocol well with cuing and instruction in correct form and positioning.  She responded well to Rx stating she felt more loose and had no pain after Rx.  Pt should continue to benefit from cont skilled PT services per protocol to address ongoing goals and to improve ROM, gait, strength, and overall function.  OBJECTIVE IMPAIRMENTS: Abnormal gait, decreased activity tolerance, decreased endurance, decreased knowledge of use of DME, decreased mobility, difficulty walking, decreased ROM, decreased strength, hypomobility, increased edema, impaired flexibility, and pain.   ACTIVITY LIMITATIONS: bending, standing, squatting, stairs, transfers, bed mobility, dressing, and locomotion level  PARTICIPATION LIMITATIONS: meal prep, cleaning, laundry, driving, shopping, community activity, and volunteer work  PERSONAL FACTORS: 3+ comorbidities: moderate deg changes, osteoporosis, and lumbar pain  are also affecting patient's functional outcome.   REHAB POTENTIAL: Good  CLINICAL DECISION MAKING: Stable/uncomplicated  EVALUATION COMPLEXITY: Low   GOALS:  SHORT TERM GOALS: Target date: 08/30/2022  Pt will be independent and compliant with HEP for improved pain, ROM, strength, and function.  Baseline: Goal status: GOAL MET  2.  Pt will progress with Wb'ing per MD orders without adverse effects for improved mobility.  Baseline:  Goal status: GOAL MET  3.  Pt will demo L knee flexion PROM to be 90 deg for improved stiffness and mobility.  Baseline:  Goal status: achieved   4.  Pt will demo a good quad set and perform a supine SLR independently without significant extensor lag for improved strength.   Baseline:  Goal status:achieved  5.  Pt will be able to perform a 6 inch step up with good form and control.  Baseline:  Goal status: INITIAL Target date:  10/11/2022  6.  Pt will ambulate with a normalized heel to toe gait pattern without AD.  Baseline:  Goal status: INITIAL Target date:  10/25/2022  7.  Pt will demo L knee AROM to 0 - 115 deg for improved mobility and stiffness.  Baseline:  Goal status: INITIAL Target date:  10/25/2022   LONG TERM GOALS: Target date: 11/22/2022   Pt will ambulate extended community distance without difficulty and significant pain.  Baseline:  Goal status: INITIAL  2.  Pt will be able to perform stairs with a reciprocal gait with the rail.   Baseline:  Goal status: INITIAL  3.  Pt will be able to perform her ADLs and IADLs without significant pain and difficulty.  Baseline:  Goal status: INITIAL  4.  Pt will be able to perform her volunteer work without adverse effects.  Baseline:  Goal status: INITIAL  5.  Pt will demo 5/5 strength in L hip flex and abd and at least 4+/5 strength in L knee flex and extension for improved performance of and tolerance with functional mobility.  Baseline:  Goal status: INITIAL     PLAN:  PT FREQUENCY:  1x/wk x 4 weeks and 2x/wk afterwards  PT DURATION: other: 16 weeks  PLANNED INTERVENTIONS: Therapeutic exercises, Therapeutic activity, Neuromuscular re-education, Balance training, Gait training, Patient/Family education, Self Care, Joint mobilization, Stair training, Aquatic Therapy, Dry Needling, Electrical stimulation, Cryotherapy, Moist heat, scar mobilization, Taping, Ultrasound, Manual therapy, and Re-evaluation  PLAN FOR NEXT SESSION:  Cont per Dr. Serena Croissant meniscus repair protocol.  Work on improving flexion ROM.  Cont with strengthening per protocol.  Perform aquatic therapy soon.  ATC sent message to PT from MD concerning pt starting aquatic therapy.   Audie Clear III PT, DPT 09/26/22  10:30 PM

## 2022-09-26 ENCOUNTER — Encounter (HOSPITAL_BASED_OUTPATIENT_CLINIC_OR_DEPARTMENT_OTHER): Payer: Self-pay | Admitting: Physical Therapy

## 2022-09-27 ENCOUNTER — Encounter (HOSPITAL_BASED_OUTPATIENT_CLINIC_OR_DEPARTMENT_OTHER): Payer: Medicare PPO

## 2022-09-27 DIAGNOSIS — H04123 Dry eye syndrome of bilateral lacrimal glands: Secondary | ICD-10-CM | POA: Diagnosis not present

## 2022-09-27 DIAGNOSIS — H35373 Puckering of macula, bilateral: Secondary | ICD-10-CM | POA: Diagnosis not present

## 2022-09-27 DIAGNOSIS — H2513 Age-related nuclear cataract, bilateral: Secondary | ICD-10-CM | POA: Diagnosis not present

## 2022-09-27 DIAGNOSIS — H40053 Ocular hypertension, bilateral: Secondary | ICD-10-CM | POA: Diagnosis not present

## 2022-09-28 ENCOUNTER — Encounter (HOSPITAL_BASED_OUTPATIENT_CLINIC_OR_DEPARTMENT_OTHER): Payer: Self-pay | Admitting: Orthopaedic Surgery

## 2022-09-28 ENCOUNTER — Ambulatory Visit (HOSPITAL_BASED_OUTPATIENT_CLINIC_OR_DEPARTMENT_OTHER): Payer: Medicare PPO

## 2022-09-28 ENCOUNTER — Encounter (HOSPITAL_BASED_OUTPATIENT_CLINIC_OR_DEPARTMENT_OTHER): Payer: Self-pay

## 2022-09-28 DIAGNOSIS — M6281 Muscle weakness (generalized): Secondary | ICD-10-CM

## 2022-09-28 DIAGNOSIS — R262 Difficulty in walking, not elsewhere classified: Secondary | ICD-10-CM | POA: Diagnosis not present

## 2022-09-28 DIAGNOSIS — M25562 Pain in left knee: Secondary | ICD-10-CM | POA: Diagnosis not present

## 2022-09-28 DIAGNOSIS — M25662 Stiffness of left knee, not elsewhere classified: Secondary | ICD-10-CM | POA: Diagnosis not present

## 2022-09-28 NOTE — Therapy (Signed)
OUTPATIENT PHYSICAL THERAPY TREATMENT NOTE   Patient Name: ALEISA SAULINO MRN: [id number] DOB:08/29/1955, 67 y.o., female Today's Date: 09/28/2022  END OF SESSION:  PT End of Session - 09/28/22 1346     Visit Number 14    Number of Visits 28    Date for PT Re-Evaluation 10/25/22    Authorization Type [employer] MCR    PT Start Time 1347    PT Stop Time 1430    PT Time Calculation (min) 43 min    Activity Tolerance Patient tolerated treatment well    Behavior During Therapy WFL for tasks assessed/performed                    Past Medical History:  Diagnosis Date   Arthritis    Bunion    left foot   Depression    stable on medication   Diverticulitis 2015   Diverticulitis of colon without hemorrhage 09/28/2013   Diverticulosis    Gallstones    GERD (gastroesophageal reflux disease)    Hammer toes of both feet    Hyperlipidemia    no meds needed   IBS (irritable bowel syndrome)    Meniscal injury    Tear   Onychomycosis of toenail    Osteopenia    Osteoporosis    PUD (peptic ulcer disease) 1980's   Renal cyst    4.5-6 cm stable single  seen by uro in past   Past Surgical History:  Procedure Laterality Date   CHOLECYSTECTOMY  1987   COLONOSCOPY     IRRIGATION AND DEBRIDEMENT SEBACEOUS CYST Right 3/ 2014   chest wall   KNEE ARTHROSCOPY WITH MENISCAL REPAIR Left 07/30/2022   Procedure: LEFT KNEE ARTHROSCOPY WITH MEDIAL MENISCAL ROOT REPAIR;  Surgeon: Huel Cote, MD;  Location: [facility];  Service: Orthopedics;  Laterality: Left;   UPPER GASTROINTESTINAL ENDOSCOPY     WISDOM TOOTH EXTRACTION  age 70 & 37   lower teeth done first and upper wisdom teeth last   Patient Active Problem List   Diagnosis Date Noted   Acute medial meniscus tear of left knee 07/30/2022   Elevated alkaline phosphatase measurement 09/26/2014   Renal cyst, left 12/31/2013   Osteopenia 12/31/2013   Vitamin D deficiency 12/31/2013   Postmenopausal atrophic  vaginitis 12/31/2013   Family history of colon cancer 10/02/2013   Wrist pain, right 05/13/2013   Hx of fall 05/13/2013   Hypercholesteremia 05/13/2013   History of kidney stones    Musculoskeletal leg pain 02/02/2013   Gait abnormality 02/02/2013   Chest discomfort 05/28/2011   Left wrist injury 12/01/2010   Renal cyst 12/01/2010   Cough 09/27/2010   Neck pain 08/29/2010   Tingling sensation 08/29/2010   Allergic rhinitis, seasonal 08/29/2010   LEG PAIN, RIGHT 07/01/2009   VARICOSE VEINS LOWER EXTREMITIES W/OTH COMPS 12/06/2008   BACK PAIN, THORACIC REGION 12/06/2008   BACK PAIN 11/10/2008   BRACHIAL NEURITIS OR RADICULITIS NOS 10/29/2008   Irritable bowel syndrome 04/23/2007   HYPERLIPIDEMIA NEC/NOS 01/10/2007   DEPRESSION 01/08/2007   GERD 01/08/2007   Diverticulosis of colon (without mention of hemorrhage) 07/28/2002     REFERRING PROVIDER: Huel Cote, MD   REFERRING DIAG: M23.92 (ICD-10-CM) - Acute internal derangement of left knee     S/p L knee medial meniscal root repair  THERAPY DIAG:  Left knee pain, unspecified chronicity  Muscle weakness (generalized)  Difficulty in walking, not elsewhere classified  Stiffness of left knee, not elsewhere classified  Rationale for Evaluation and Treatment: Rehabilitation  ONSET DATE: DOS 07/30/2022   SUBJECTIVE:   SUBJECTIVE STATEMENT: Pt is 8 weeks and 4 days s/p L knee medial meniscal repair.  Pt reports more stiffness than pain.   PERTINENT HISTORY: -Status post left knee medial meniscal root repair 07/30/2022.  -MRI findings of L knee moderate medial compartment deg changes -Pt has a Hx of Baker's cyst.   -Osteoporosis, OA, and lumbar pain  PAIN:  Are you having pain? Yes NPRS:  0/10 current Location:  L knee  PRECAUTIONS: Other: per surgical protocol and Wb'ing restrictions  WEIGHT BEARING RESTRICTIONS: Yes Advance WB'ing  FALLS:  Has patient fallen in last 6 months? No  LIVING  ENVIRONMENT: Lives with: lives alone Lives in: 1 story home Stairs: ramp to enter home.  Pt has 1 step in home with a rail.  Has following equipment at home:  FWW.  Sister has a cane.  OCCUPATION: Pt is retired  PLOF: Independent.  Pt was able to perform her normal functional mobility skills and daily activities independently without difficulty.  Pt ambulated without an AD.  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    PATIENT GOALS: return to [facility]  Next MD visit: 10/12/2022   OBJECTIVE:   DIAGNOSTIC FINDINGS: Pt is post op.  MRI before surgery also showed mod medial compartment deg changes with mild patellofemoral and lateral compartment deg chondrosis and a tiny Baker's cyst.    TODAY'S TREATMENT:    Therapeutic Exercise: Bike rocking back and forth x Standing heel raises  Supine SLR with 2# 2x10 Prone knee flexion AROM with PROM over pressure at end range 5sec hold x10 TKE with GTB x10 reps Prone quad set 5" x10 LAQ 4# 5" hold 2x10 Mini squats 2x10 with UE support (cues for equal WB) Standing hip abduction 2x10bil Step ups 4" 2x10 fwd   Walking marches without UE support at rail x2 laps Pt received L knee flexion and extension PROM in supine and prone with overpressure per pt and tissue tolerance   Manual:  STM distal quad, patella mobs                                                       PATIENT EDUCATION:  Education details: HEP, protocol progression, exercise form, relevant anatomy, POC, and gait.  Person educated: Patient and sister Education method: Explanation, Demonstration, Tactile cues, Verbal cues Education comprehension: verbalized understanding, returned demonstration, verbal cues required, tactile cues required, and needs further education  HOME EXERCISE PROGRAM: Access Code: [email or web address] URL: [email or web address]   ASSESSMENT:  CLINICAL IMPRESSION: Pt continues to use cane in public, but  feels comfortable without it around the house. Remains tight into end range knee flexion, but was able to complete a few full revolutions on recumbent bike today. With prone stretching, she felt quad stretch and noted tightness here. Instructed pt how to stretch this herself at home  She was able to gently progress with closed chain strengthening, adding in step ups at 4" step without difficulty noted. She denied pain with any activity. Will continue to progress with ROM and strength.   OBJECTIVE IMPAIRMENTS: Abnormal gait, decreased activity tolerance, decreased endurance, decreased knowledge of use of DME, decreased mobility, difficulty walking, decreased ROM, decreased strength, hypomobility, increased  edema, impaired flexibility, and pain.   ACTIVITY LIMITATIONS: bending, standing, squatting, stairs, transfers, bed mobility, dressing, and locomotion level  PARTICIPATION LIMITATIONS: meal prep, cleaning, laundry, driving, shopping, community activity, and volunteer work  PERSONAL FACTORS: 3+ comorbidities: moderate deg changes, osteoporosis, and lumbar pain  are also affecting patient's functional outcome.   REHAB POTENTIAL: Good  CLINICAL DECISION MAKING: Stable/uncomplicated  EVALUATION COMPLEXITY: Low   GOALS:  SHORT TERM GOALS: Target date: 08/30/2022  Pt will be independent and compliant with HEP for improved pain, ROM, strength, and function.  Baseline: Goal status: GOAL MET  2.  Pt will progress with Wb'ing per MD orders without adverse effects for improved mobility.  Baseline:  Goal status: GOAL MET  3.  Pt will demo L knee flexion PROM to be 90 deg for improved stiffness and mobility.  Baseline:  Goal status: achieved   4.  Pt will demo a good quad set and perform a supine SLR independently without significant extensor lag for improved strength.  Baseline:  Goal status:achieved  5.  Pt will be able to perform a 6 inch step up with good form and control.  Baseline:   Goal status: INITIAL Target date:  10/11/2022  6.  Pt will ambulate with a normalized heel to toe gait pattern without AD.  Baseline:  Goal status: INITIAL Target date:  10/25/2022  7.  Pt will demo L knee AROM to 0 - 115 deg for improved mobility and stiffness.  Baseline:  Goal status: INITIAL Target date:  10/25/2022   LONG TERM GOALS: Target date: 11/22/2022   Pt will ambulate extended community distance without difficulty and significant pain.  Baseline:  Goal status: INITIAL  2.  Pt will be able to perform stairs with a reciprocal gait with the rail.   Baseline:  Goal status: INITIAL  3.  Pt will be able to perform her ADLs and IADLs without significant pain and difficulty.  Baseline:  Goal status: INITIAL  4.  Pt will be able to perform her volunteer work without adverse effects.  Baseline:  Goal status: INITIAL  5.  Pt will demo 5/5 strength in L hip flex and abd and at least 4+/5 strength in L knee flex and extension for improved performance of and tolerance with functional mobility.  Baseline:  Goal status: INITIAL     PLAN:  PT FREQUENCY:  1x/wk x 4 weeks and 2x/wk afterwards  PT DURATION: other: 16 weeks  PLANNED INTERVENTIONS: Therapeutic exercises, Therapeutic activity, Neuromuscular re-education, Balance training, Gait training, Patient/Family education, Self Care, Joint mobilization, Stair training, Aquatic Therapy, Dry Needling, Electrical stimulation, Cryotherapy, Moist heat, scar mobilization, Taping, Ultrasound, Manual therapy, and Re-evaluation  PLAN FOR NEXT SESSION:  Cont per Dr. Serena Croissant meniscus repair protocol.  Work on improving flexion ROM.  Cont with strengthening per protocol.  Perform aquatic therapy soon.  ATC sent message to PT from MD concerning pt starting aquatic therapy.   Riki Altes, PTA  09/28/22 2:37 PM

## 2022-10-02 ENCOUNTER — Ambulatory Visit (HOSPITAL_BASED_OUTPATIENT_CLINIC_OR_DEPARTMENT_OTHER): Payer: Medicare PPO | Admitting: Physical Therapy

## 2022-10-02 DIAGNOSIS — M25662 Stiffness of left knee, not elsewhere classified: Secondary | ICD-10-CM

## 2022-10-02 DIAGNOSIS — M25562 Pain in left knee: Secondary | ICD-10-CM | POA: Diagnosis not present

## 2022-10-02 DIAGNOSIS — M6281 Muscle weakness (generalized): Secondary | ICD-10-CM

## 2022-10-02 DIAGNOSIS — R262 Difficulty in walking, not elsewhere classified: Secondary | ICD-10-CM | POA: Diagnosis not present

## 2022-10-02 NOTE — Therapy (Signed)
OUTPATIENT PHYSICAL THERAPY TREATMENT NOTE   Patient Name: [name] [name] MRN: [id number] DOB:1956/02/18, 67 y.o., female Today's Date: 10/03/2022  END OF SESSION:  PT End of Session - 10/02/22 1548     Visit Number 15    Number of Visits 28    Date for PT Re-Evaluation 10/25/22    Authorization Type [employer] MCR    PT Start Time 1457    PT Stop Time 1537    PT Time Calculation (min) 40 min    Activity Tolerance Patient tolerated treatment well    Behavior During Therapy WFL for tasks assessed/performed                    Past Medical History:  Diagnosis Date   Arthritis    Bunion    left foot   Depression    stable on medication   Diverticulitis 2015   Diverticulitis of colon without hemorrhage 09/28/2013   Diverticulosis    Gallstones    GERD (gastroesophageal reflux disease)    Hammer toes of both feet    Hyperlipidemia    no meds needed   IBS (irritable bowel syndrome)    Meniscal injury    Tear   Onychomycosis of toenail    Osteopenia    Osteoporosis    PUD (peptic ulcer disease) 1980's   Renal cyst    4.5-6 cm stable single  seen by uro in past   Past Surgical History:  Procedure Laterality Date   CHOLECYSTECTOMY  1987   COLONOSCOPY     IRRIGATION AND DEBRIDEMENT SEBACEOUS CYST Right 3/ 2014   chest wall   KNEE ARTHROSCOPY WITH MENISCAL REPAIR Left 07/30/2022   Procedure: LEFT KNEE ARTHROSCOPY WITH MEDIAL MENISCAL ROOT REPAIR;  Surgeon: Huel Cote, MD;  Location: [facility];  Service: Orthopedics;  Laterality: Left;   UPPER GASTROINTESTINAL ENDOSCOPY     WISDOM TOOTH EXTRACTION  age 88 & 63   lower teeth done first and upper wisdom teeth last   Patient Active Problem List   Diagnosis Date Noted   Acute medial meniscus tear of left knee 07/30/2022   Elevated alkaline phosphatase measurement 09/26/2014   Renal cyst, left 12/31/2013   Osteopenia 12/31/2013   Vitamin D deficiency 12/31/2013   Postmenopausal atrophic  vaginitis 12/31/2013   Family history of colon cancer 10/02/2013   Wrist pain, right 05/13/2013   Hx of fall 05/13/2013   Hypercholesteremia 05/13/2013   History of kidney stones    Musculoskeletal leg pain 02/02/2013   Gait abnormality 02/02/2013   Chest discomfort 05/28/2011   Left wrist injury 12/01/2010   Renal cyst 12/01/2010   Cough 09/27/2010   Neck pain 08/29/2010   Tingling sensation 08/29/2010   Allergic rhinitis, seasonal 08/29/2010   LEG PAIN, RIGHT 07/01/2009   VARICOSE VEINS LOWER EXTREMITIES W/OTH COMPS 12/06/2008   BACK PAIN, THORACIC REGION 12/06/2008   BACK PAIN 11/10/2008   BRACHIAL NEURITIS OR RADICULITIS NOS 10/29/2008   Irritable bowel syndrome 04/23/2007   HYPERLIPIDEMIA NEC/NOS 01/10/2007   DEPRESSION 01/08/2007   GERD 01/08/2007   Diverticulosis of colon (without mention of hemorrhage) 07/28/2002     REFERRING PROVIDER: Huel Cote, MD   REFERRING DIAG: M23.92 (ICD-10-CM) - Acute internal derangement of left knee     S/p L knee medial meniscal root repair  THERAPY DIAG:  Left knee pain, unspecified chronicity  Muscle weakness (generalized)  Difficulty in walking, not elsewhere classified  Stiffness of left knee, not elsewhere classified  Rationale for Evaluation and Treatment: Rehabilitation  ONSET DATE: DOS 07/30/2022   SUBJECTIVE:   SUBJECTIVE STATEMENT: Pt is 9 weeks and 1 day s/p L knee medial meniscal repair.  Pt messaged MD about driving and he released her to drive.  Pt states she felt fine driving though has some difficulty getting in/out of car.  Pt reports having some soreness after prior Rx though no increased pain.    PERTINENT HISTORY: -Status post left knee medial meniscal root repair 07/30/2022.  -MRI findings of L knee moderate medial compartment deg changes -Pt has a Hx of Baker's cyst.   -Osteoporosis, OA, and lumbar pain  PAIN:  Are you having pain? Yes NPRS:  2/10 current Location:  L medial  knee  PRECAUTIONS: Other: per surgical protocol and Wb'ing restrictions  WEIGHT BEARING RESTRICTIONS: Yes Advance WB'ing  FALLS:  Has patient fallen in last 6 months? No  LIVING ENVIRONMENT: Lives with: lives alone Lives in: 1 story home Stairs: ramp to enter home.  Pt has 1 step in home with a rail.  Has following equipment at home:  FWW.  Sister has a cane.  OCCUPATION: Pt is retired  PLOF: Independent.  Pt was able to perform her normal functional mobility skills and daily activities independently without difficulty.  Pt ambulated without an AD.  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    PATIENT GOALS: return to [facility]  Next MD visit: 10/12/2022   OBJECTIVE:   DIAGNOSTIC FINDINGS: Pt is post op.  MRI before surgery also showed mod medial compartment deg changes with mild patellofemoral and lateral compartment deg chondrosis and a tiny Baker's cyst.    TODAY'S TREATMENT:    Therapeutic Exercise: Bike rocking back and forth and bwd revolutions x Standing heel raises  Supine SLR with 2# 2x10 Prone knee flexion AROM 3 x10 TKE with GTB 2x10 reps Mini squats 2x10 with UE support (cues for equal WB) Standing hip abduction L LE:  x10 and x15 with YTB ; R LE x 10 Step ups 4" 2x10-12 fwd    Pt received patella mobs f/b L knee flexion and extension PROM in supine                                                       PATIENT EDUCATION:  Education details: HEP, protocol progression, exercise form, relevant anatomy, POC, and gait.  Person educated: Patient and sister Education method: Explanation, Demonstration, Tactile cues, Verbal cues Education comprehension: verbalized understanding, returned demonstration, verbal cues required, tactile cues required, and needs further education  HOME EXERCISE PROGRAM: Access Code: [email or web address] URL: [email or web address]   ASSESSMENT:  CLINICAL IMPRESSION: Pt is progressing  appropriately with protocol as evidenced by performance of exercises.  She is performing closed chain exercises without c/o's.  She performed exercises well with cuing and instruction in correct form.  Pt continues to have tightness and limitations with knee flexion ROM though is slowly making progress.  Pt responded well to Rx having no increased pain after Rx.  Pt should continue to benefit from cont skilled PT services per protocol to improve ROM, strength, and function and to address ongoing goals.   OBJECTIVE IMPAIRMENTS: Abnormal gait, decreased activity tolerance, decreased endurance, decreased knowledge of use of DME, decreased mobility, difficulty walking, decreased  ROM, decreased strength, hypomobility, increased edema, impaired flexibility, and pain.   ACTIVITY LIMITATIONS: bending, standing, squatting, stairs, transfers, bed mobility, dressing, and locomotion level  PARTICIPATION LIMITATIONS: meal prep, cleaning, laundry, driving, shopping, community activity, and volunteer work  PERSONAL FACTORS: 3+ comorbidities: moderate deg changes, osteoporosis, and lumbar pain  are also affecting patient's functional outcome.   REHAB POTENTIAL: Good  CLINICAL DECISION MAKING: Stable/uncomplicated  EVALUATION COMPLEXITY: Low   GOALS:  SHORT TERM GOALS: Target date: 08/30/2022  Pt will be independent and compliant with HEP for improved pain, ROM, strength, and function.  Baseline: Goal status: GOAL MET  2.  Pt will progress with Wb'ing per MD orders without adverse effects for improved mobility.  Baseline:  Goal status: GOAL MET  3.  Pt will demo L knee flexion PROM to be 90 deg for improved stiffness and mobility.  Baseline:  Goal status: achieved   4.  Pt will demo a good quad set and perform a supine SLR independently without significant extensor lag for improved strength.  Baseline:  Goal status:achieved  5.  Pt will be able to perform a 6 inch step up with good form and  control.  Baseline:  Goal status: INITIAL Target date:  10/11/2022  6.  Pt will ambulate with a normalized heel to toe gait pattern without AD.  Baseline:  Goal status: INITIAL Target date:  10/25/2022  7.  Pt will demo L knee AROM to 0 - 115 deg for improved mobility and stiffness.  Baseline:  Goal status: INITIAL Target date:  10/25/2022   LONG TERM GOALS: Target date: 11/22/2022   Pt will ambulate extended community distance without difficulty and significant pain.  Baseline:  Goal status: INITIAL  2.  Pt will be able to perform stairs with a reciprocal gait with the rail.   Baseline:  Goal status: INITIAL  3.  Pt will be able to perform her ADLs and IADLs without significant pain and difficulty.  Baseline:  Goal status: INITIAL  4.  Pt will be able to perform her volunteer work without adverse effects.  Baseline:  Goal status: INITIAL  5.  Pt will demo 5/5 strength in L hip flex and abd and at least 4+/5 strength in L knee flex and extension for improved performance of and tolerance with functional mobility.  Baseline:  Goal status: INITIAL     PLAN:  PT FREQUENCY:  1x/wk x 4 weeks and 2x/wk afterwards  PT DURATION: other: 16 weeks  PLANNED INTERVENTIONS: Therapeutic exercises, Therapeutic activity, Neuromuscular re-education, Balance training, Gait training, Patient/Family education, Self Care, Joint mobilization, Stair training, Aquatic Therapy, Dry Needling, Electrical stimulation, Cryotherapy, Moist heat, scar mobilization, Taping, Ultrasound, Manual therapy, and Re-evaluation  PLAN FOR NEXT SESSION:  Cont per Dr. Serena Croissant meniscus repair protocol.  Work on improving flexion ROM.  Cont with strengthening per protocol.  Perform aquatic therapy soon.  ATC sent message to PT from MD concerning pt starting aquatic therapy.  Aquatic therapy on the visit after next.   Audie Clear III PT, DPT 10/03/22 10:36 PM

## 2022-10-03 ENCOUNTER — Encounter (HOSPITAL_BASED_OUTPATIENT_CLINIC_OR_DEPARTMENT_OTHER): Payer: Self-pay | Admitting: Physical Therapy

## 2022-10-04 ENCOUNTER — Encounter (HOSPITAL_BASED_OUTPATIENT_CLINIC_OR_DEPARTMENT_OTHER): Payer: Self-pay

## 2022-10-04 ENCOUNTER — Ambulatory Visit (HOSPITAL_BASED_OUTPATIENT_CLINIC_OR_DEPARTMENT_OTHER): Payer: Medicare PPO

## 2022-10-04 DIAGNOSIS — M25662 Stiffness of left knee, not elsewhere classified: Secondary | ICD-10-CM

## 2022-10-04 DIAGNOSIS — R262 Difficulty in walking, not elsewhere classified: Secondary | ICD-10-CM | POA: Diagnosis not present

## 2022-10-04 DIAGNOSIS — M6281 Muscle weakness (generalized): Secondary | ICD-10-CM

## 2022-10-04 DIAGNOSIS — M25562 Pain in left knee: Secondary | ICD-10-CM | POA: Diagnosis not present

## 2022-10-04 NOTE — Therapy (Signed)
OUTPATIENT PHYSICAL THERAPY TREATMENT NOTE   Patient Name: [name] [name] MRN: [id number] DOB:January 03, 1956, 67 y.o., female Today's Date: 10/04/2022  END OF SESSION:  PT End of Session - 10/04/22 1436     Visit Number 16    Number of Visits 28    Date for PT Re-Evaluation 10/25/22    Authorization Type [employer] MCR    PT Start Time 1436    PT Stop Time 1515    PT Time Calculation (min) 39 min    Activity Tolerance Patient tolerated treatment well    Behavior During Therapy WFL for tasks assessed/performed                    Past Medical History:  Diagnosis Date   Arthritis    Bunion    left foot   Depression    stable on medication   Diverticulitis 2015   Diverticulitis of colon without hemorrhage 09/28/2013   Diverticulosis    Gallstones    GERD (gastroesophageal reflux disease)    Hammer toes of both feet    Hyperlipidemia    no meds needed   IBS (irritable bowel syndrome)    Meniscal injury    Tear   Onychomycosis of toenail    Osteopenia    Osteoporosis    PUD (peptic ulcer disease) 1980's   Renal cyst    4.5-6 cm stable single  seen by uro in past   Past Surgical History:  Procedure Laterality Date   CHOLECYSTECTOMY  1987   COLONOSCOPY     IRRIGATION AND DEBRIDEMENT SEBACEOUS CYST Right 3/ 2014   chest wall   KNEE ARTHROSCOPY WITH MENISCAL REPAIR Left 07/30/2022   Procedure: LEFT KNEE ARTHROSCOPY WITH MEDIAL MENISCAL ROOT REPAIR;  Surgeon: Huel Cote, MD;  Location: [facility];  Service: Orthopedics;  Laterality: Left;   UPPER GASTROINTESTINAL ENDOSCOPY     WISDOM TOOTH EXTRACTION  age 25 & 69   lower teeth done first and upper wisdom teeth last   Patient Active Problem List   Diagnosis Date Noted   Acute medial meniscus tear of left knee 07/30/2022   Elevated alkaline phosphatase measurement 09/26/2014   Renal cyst, left 12/31/2013   Osteopenia 12/31/2013   Vitamin D deficiency 12/31/2013   Postmenopausal atrophic  vaginitis 12/31/2013   Family history of colon cancer 10/02/2013   Wrist pain, right 05/13/2013   Hx of fall 05/13/2013   Hypercholesteremia 05/13/2013   History of kidney stones    Musculoskeletal leg pain 02/02/2013   Gait abnormality 02/02/2013   Chest discomfort 05/28/2011   Left wrist injury 12/01/2010   Renal cyst 12/01/2010   Cough 09/27/2010   Neck pain 08/29/2010   Tingling sensation 08/29/2010   Allergic rhinitis, seasonal 08/29/2010   LEG PAIN, RIGHT 07/01/2009   VARICOSE VEINS LOWER EXTREMITIES W/OTH COMPS 12/06/2008   BACK PAIN, THORACIC REGION 12/06/2008   BACK PAIN 11/10/2008   BRACHIAL NEURITIS OR RADICULITIS NOS 10/29/2008   Irritable bowel syndrome 04/23/2007   HYPERLIPIDEMIA NEC/NOS 01/10/2007   DEPRESSION 01/08/2007   GERD 01/08/2007   Diverticulosis of colon (without mention of hemorrhage) 07/28/2002     REFERRING PROVIDER: Huel Cote, MD   REFERRING DIAG: M23.92 (ICD-10-CM) - Acute internal derangement of left knee     S/p L knee medial meniscal root repair  THERAPY DIAG:  Left knee pain, unspecified chronicity  Muscle weakness (generalized)  Difficulty in walking, not elsewhere classified  Stiffness of left knee, not elsewhere classified  Rationale for Evaluation and Treatment: Rehabilitation  ONSET DATE: DOS 07/30/2022   SUBJECTIVE:   SUBJECTIVE STATEMENT: Pt is 9 weeks and 3 days s/p L knee medial meniscal repair.  No pain at entry. Arrives with [facility].    PERTINENT HISTORY: -Status post left knee medial meniscal root repair 07/30/2022.  -MRI findings of L knee moderate medial compartment deg changes -Pt has a Hx of Baker's cyst.   -Osteoporosis, OA, and lumbar pain  PAIN:  Are you having pain? Yes NPRS:  2/10 current Location:  L medial knee  PRECAUTIONS: Other: per surgical protocol and Wb'ing restrictions  WEIGHT BEARING RESTRICTIONS: Yes Advance WB'ing  FALLS:  Has patient fallen in last 6 months? No  LIVING  ENVIRONMENT: Lives with: lives alone Lives in: 1 story home Stairs: ramp to enter home.  Pt has 1 step in home with a rail.  Has following equipment at home:  FWW.  Sister has a cane.  OCCUPATION: Pt is retired  PLOF: Independent.  Pt was able to perform her normal functional mobility skills and daily activities independently without difficulty.  Pt ambulated without an AD.  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    PATIENT GOALS: return to [facility]  Next MD visit: 10/12/2022   OBJECTIVE:   DIAGNOSTIC FINDINGS: Pt is post op.  MRI before surgery also showed mod medial compartment deg changes with mild patellofemoral and lateral compartment deg chondrosis and a tiny Baker's cyst.    TODAY'S TREATMENT:    Therapeutic Exercise: Bike full revolutions x (seat 9) Standing heel raises 2x10 Supine SLR with 2# 2x10 Mini squats 2x10 with UE support (cues for equal WB and for form) Standing hip abduction YTB 2x10ea Step ups 6" fwd x13 Modified SL bridge  (L more posterior) x15 Stair training- 1/2 flight: reciprocal up, step to down. Single rail use. Gait training without SPC 71ft x2    Pt received patella mobs f/b L knee flexion and extension PROM in supine                                                       PATIENT EDUCATION:  Education details: HEP, protocol progression, exercise form, relevant anatomy, POC, and gait.  Person educated: Patient and sister Education method: Explanation, Demonstration, Tactile cues, Verbal cues Education comprehension: verbalized understanding, returned demonstration, verbal cues required, tactile cues required, and needs further education  HOME EXERCISE PROGRAM: Access Code: [email or web address] URL: [email or web address]   ASSESSMENT:  CLINICAL IMPRESSION: She remains limited in end range knee flexion and benefits from PROM with gentle overpressure at end range. Pt able to complete full  revolutions on recumbent bike  for entire duration today following PROM (reverse direction). She was instructed in proper technique with heel slides at home. Pt does require verbal cues for form with standing exercises and also required tactile cues for proper mini squat performance. With supine SLR, she requires occasional cues for increased quad engagement ,but overall can self correct this. Demonstrated safe gait pattern without AD with minimal deviation. Increased height with step ups to 6" with overall good tolerance. Worked on stair training with pt able to ascend stairs with reciprocal pattern and step to descent. No complaints of pain with this.   OBJECTIVE IMPAIRMENTS: Abnormal gait, decreased activity tolerance,  decreased endurance, decreased knowledge of use of DME, decreased mobility, difficulty walking, decreased ROM, decreased strength, hypomobility, increased edema, impaired flexibility, and pain.   ACTIVITY LIMITATIONS: bending, standing, squatting, stairs, transfers, bed mobility, dressing, and locomotion level  PARTICIPATION LIMITATIONS: meal prep, cleaning, laundry, driving, shopping, community activity, and volunteer work  PERSONAL FACTORS: 3+ comorbidities: moderate deg changes, osteoporosis, and lumbar pain  are also affecting patient's functional outcome.   REHAB POTENTIAL: Good  CLINICAL DECISION MAKING: Stable/uncomplicated  EVALUATION COMPLEXITY: Low   GOALS:  SHORT TERM GOALS: Target date: 08/30/2022  Pt will be independent and compliant with HEP for improved pain, ROM, strength, and function.  Baseline: Goal status: GOAL MET  2.  Pt will progress with Wb'ing per MD orders without adverse effects for improved mobility.  Baseline:  Goal status: GOAL MET  3.  Pt will demo L knee flexion PROM to be 90 deg for improved stiffness and mobility.  Baseline:  Goal status: achieved   4.  Pt will demo a good quad set and perform a supine SLR independently without  significant extensor lag for improved strength.  Baseline:  Goal status:achieved  5.  Pt will be able to perform a 6 inch step up with good form and control.  Baseline:  Goal status: INITIAL Target date:  10/11/2022  6.  Pt will ambulate with a normalized heel to toe gait pattern without AD.  Baseline:  Goal status: INITIAL Target date:  10/25/2022  7.  Pt will demo L knee AROM to 0 - 115 deg for improved mobility and stiffness.  Baseline:  Goal status: INITIAL Target date:  10/25/2022   LONG TERM GOALS: Target date: 11/22/2022   Pt will ambulate extended community distance without difficulty and significant pain.  Baseline:  Goal status: INITIAL  2.  Pt will be able to perform stairs with a reciprocal gait with the rail.   Baseline:  Goal status: INITIAL  3.  Pt will be able to perform her ADLs and IADLs without significant pain and difficulty.  Baseline:  Goal status: INITIAL  4.  Pt will be able to perform her volunteer work without adverse effects.  Baseline:  Goal status: INITIAL  5.  Pt will demo 5/5 strength in L hip flex and abd and at least 4+/5 strength in L knee flex and extension for improved performance of and tolerance with functional mobility.  Baseline:  Goal status: INITIAL     PLAN:  PT FREQUENCY:  1x/wk x 4 weeks and 2x/wk afterwards  PT DURATION: other: 16 weeks  PLANNED INTERVENTIONS: Therapeutic exercises, Therapeutic activity, Neuromuscular re-education, Balance training, Gait training, Patient/Family education, Self Care, Joint mobilization, Stair training, Aquatic Therapy, Dry Needling, Electrical stimulation, Cryotherapy, Moist heat, scar mobilization, Taping, Ultrasound, Manual therapy, and Re-evaluation  PLAN FOR NEXT SESSION:  Cont per Dr. Serena Croissant meniscus repair protocol.  Work on improving flexion ROM.  Cont with strengthening per protocol.  Perform aquatic therapy soon.  ATC sent message to PT from MD concerning pt starting aquatic  therapy.  Aquatic therapy on the visit after next.   Riki Altes, PTA  10/04/22 3:59 PM

## 2022-10-05 ENCOUNTER — Encounter: Payer: Self-pay | Admitting: Obstetrics and Gynecology

## 2022-10-05 ENCOUNTER — Ambulatory Visit (INDEPENDENT_AMBULATORY_CARE_PROVIDER_SITE_OTHER): Payer: Medicare PPO | Admitting: Obstetrics and Gynecology

## 2022-10-05 ENCOUNTER — Other Ambulatory Visit (HOSPITAL_COMMUNITY)
Admission: RE | Admit: 2022-10-05 | Discharge: 2022-10-05 | Disposition: A | Discharge status: Home / Self Care | Payer: Medicare PPO | Source: Ambulatory Visit | Attending: Obstetrics and Gynecology | Admitting: Obstetrics and Gynecology

## 2022-10-05 VITALS — BP 128/86 | HR 74 | Resp 20 | Ht 65.35 in | Wt 186.2 lb

## 2022-10-05 DIAGNOSIS — M810 Age-related osteoporosis without current pathological fracture: Secondary | ICD-10-CM

## 2022-10-05 DIAGNOSIS — N762 Acute vulvitis: Secondary | ICD-10-CM

## 2022-10-05 DIAGNOSIS — B3731 Acute candidiasis of vulva and vagina: Secondary | ICD-10-CM | POA: Diagnosis not present

## 2022-10-05 DIAGNOSIS — Z01419 Encounter for gynecological examination (general) (routine) without abnormal findings: Secondary | ICD-10-CM | POA: Diagnosis not present

## 2022-10-05 DIAGNOSIS — Z1151 Encounter for screening for human papillomavirus (HPV): Secondary | ICD-10-CM | POA: Insufficient documentation

## 2022-10-05 DIAGNOSIS — Z124 Encounter for screening for malignant neoplasm of cervix: Secondary | ICD-10-CM | POA: Diagnosis not present

## 2022-10-05 LAB — WET PREP FOR TRICH, YEAST, CLUE

## 2022-10-05 MED ORDER — FLUCONAZOLE 150 MG PO TABS: 150.0000 mg | ORAL_TABLET | Freq: Once | ORAL | 0 refills | Status: AC

## 2022-10-05 MED ORDER — CLOBETASOL PROPIONATE 0.05 % EX OINT: 1.0000 | TOPICAL_OINTMENT | Freq: Two times a day (BID) | CUTANEOUS | 1 refills | Status: DC

## 2022-10-05 NOTE — Patient Instructions (Signed)

## 2022-10-05 NOTE — Progress Notes (Signed)
67 y.o. G0P0000 Divorced White or Caucasian Not Hispanic or Latino female here for annual exam.  No vaginal bleeding. No bowel or bladder c/o.  She reports nocturia 1-2 x a night.  She has intermittent issues with vulvar itching, uses steroid ointment. Lately it has been worse. She has been using the steroid ointment 3 x a week. Doesn't find relief with Vaseline.   No incontinence.     Patient's last menstrual period was 11/18/2004.          Sexually active: No.  The current method of family planning is post menopausal status.    Exercising: Yes.     Stretching, walking Smoker:  no  Health Maintenance: Pap:  2019 normal, neg hpv History of abnormal Pap:  no MMG:  06/13/22, Birads 1, neg BMD:   03/23/2021 osteoporosis; had a dexa 2 weeks ago awaiting results from [facility]. On Prolia, last in 1/24. Started it in 7/23.  Colonoscopy: 2019, repeat 5 years. Due in 11/24, she will schedule it.  TDaP:  05/13/2013 Gardasil: None   reports that she has never smoked. She has never used smokeless tobacco. She reports that she does not drink alcohol and does not use drugs. She is retired.   Past Medical History:  Diagnosis Date   Arthritis    Bunion    left foot   Depression    stable on medication   Diverticulitis 2015   Diverticulitis of colon without hemorrhage 09/28/2013   Diverticulosis    Gallstones    GERD (gastroesophageal reflux disease)    Hammer toes of both feet    Hyperlipidemia    no meds needed   IBS (irritable bowel syndrome)    Meniscal injury    Tear   Onychomycosis of toenail    Osteopenia    Osteoporosis    PUD (peptic ulcer disease) 1980's   Renal cyst    4.5-6 cm stable single  seen by uro in past    Past Surgical History:  Procedure Laterality Date   CHOLECYSTECTOMY  1987   COLONOSCOPY     IRRIGATION AND DEBRIDEMENT SEBACEOUS CYST Right 3/ 2014   chest wall   KNEE ARTHROSCOPY WITH MENISCAL REPAIR Left 07/30/2022   Procedure:  LEFT KNEE ARTHROSCOPY WITH MEDIAL MENISCAL ROOT REPAIR;  Surgeon: Huel Cote, MD;  Location: [facility];  Service: Orthopedics;  Laterality: Left;   UPPER GASTROINTESTINAL ENDOSCOPY     WISDOM TOOTH EXTRACTION  age 14 & 67   lower teeth done first and upper wisdom teeth last    Current Outpatient Medications  Medication Sig Dispense Refill   betamethasone valerate ointment (VALISONE) 0.1 % Apply a pea sized amoutn topically 1-2 x a week as needed 30 g 0   Calcium Carb-Cholecalciferol (CALCIUM 600+D3 PO) Take 1 tablet by mouth daily.     CEQUA 0.09 % SOLN Apply 1 drop to eye 2 (two) times daily.     denosumab (PROLIA) 60 MG/ML SOSY injection Inject 60 mg into the skin every 6 (six) months.     FLUoxetine (PROZAC) 10 MG tablet Take 5 mg by mouth daily.     FLUoxetine (PROZAC) 40 MG capsule Take 40 mg by mouth daily.     Multiple Vitamins-Minerals (CENTRUM ULTRA WOMENS PO) Take 1 tablet by mouth every other day.     omeprazole (PRILOSEC) 20 MG capsule Take by mouth.     Probiotic Product (PROBIOTIC PO) Take by mouth daily.  No current facility-administered medications for this visit.    Family History  Problem Relation Age of Onset   Osteoarthritis Mother    COPD Mother    Pneumonia Mother    Osteoporosis Mother    Rheum arthritis Father    Pneumonia Father        deceased   Colon cancer Father 59   Heart attack Father    Breast cancer Sister 76   Colon polyps Sister    Cancer Maternal Grandmother        ? GI   Heart failure Maternal Grandfather    Breast cancer Paternal Grandmother 29   Heart failure Paternal Grandfather    Esophageal cancer Neg Hx    Rectal cancer Neg Hx    Stomach cancer Neg Hx     Review of Systems  All other systems reviewed and are negative.   Exam:   BP 128/86 (BP Location: Right Arm, Patient Position: Sitting)   Pulse 74   Resp 20   Ht 5' 5.35" (1.66 m) Comment: with shoes  Wt 186 lb 3.2 oz (84.5 kg)   LMP 11/18/2004    SpO2 98%   BMI 30.65 kg/m   Weight change: @WEIGHTCHANGE @ Height:   Height: 5' 5.35" (166 cm) (with shoes)  Ht Readings from Last 3 Encounters:  10/05/22 5' 5.35" (1.66 m)  07/30/22 5\' 5"  (1.651 m)  07/15/22 5\' 5"  (1.651 m)    General appearance: alert, cooperative and appears stated age Head: Normocephalic, without obvious abnormality, atraumatic Neck: no adenopathy, supple, symmetrical, trachea midline and thyroid normal to inspection and palpation Breasts: normal appearance, no masses or tenderness Abdomen: soft, non-tender; non distended,  no masses,  no organomegaly Extremities: extremities normal, atraumatic, no cyanosis or edema Skin: Skin color, texture, turgor normal. No rashes or lesions Lymph nodes: Cervical, supraclavicular, and axillary nodes normal. No abnormal inguinal nodes palpated Neurologic: Grossly normal   Pelvic: External genitalia:  no lesions, mild diffuse erythema              Urethra:  normal appearing urethra with no masses, tenderness or lesions              Bartholins and Skenes: normal                 Vagina: normal appearing vagina with normal color and discharge, no lesions              Cervix: no lesions               Bimanual Exam:  Uterus:   no masses or tenderness              Adnexa: no mass, fullness, tenderness               Rectovaginal: Confirms               Anus:  normal sphincter tone, no lesions  Carmelina Dane, RN chaperoned for the exam.  1. Encounter for breast and pelvic examination Discussed breast self exam Discussed calcium and vit D intake Labs with primary Colonoscopy later this year Mammogram UTD  2. Age-related osteoporosis without current pathological fracture On prolia, DEXA results pending with her primary  3. Screening for cervical cancer - Cytology - PAP  4. Acute vulvitis Discussed vulvar skin care - WET PREP FOR TRICH, YEAST, CLUE - clobetasol ointment (TEMOVATE) 0.05 %; Apply 1 Application topically 2 (two)  times daily. Apply as directed twice daily for up to  2 weeks, then change to 1-2 x a week.  Dispense: 60 g; Refill: 1  5. Yeast vaginitis - fluconazole (DIFLUCAN) 150 MG tablet; Take 1 tablet (150 mg total) by mouth once for 1 dose. Take one tablet.  Repeat in 72 hours if symptoms are not completely resolved.  Dispense: 2 tablet; Refill: 0

## 2022-10-07 NOTE — Therapy (Signed)
OUTPATIENT PHYSICAL THERAPY TREATMENT NOTE   Patient Name: [name] [name] MRN: [id number] DOB:10-26-1955, 67 y.o., female Today's Date: 10/09/2022  END OF SESSION:  PT End of Session - 10/08/22 1522     Visit Number 17    Number of Visits 28    Date for PT Re-Evaluation 10/25/22    Authorization Type [employer] MCR    PT Start Time 1458    PT Stop Time 1538    PT Time Calculation (min) 40 min    Activity Tolerance Patient tolerated treatment well    Behavior During Therapy WFL for tasks assessed/performed                     Past Medical History:  Diagnosis Date   Arthritis    Bunion    left foot   Depression    stable on medication   Diverticulitis 2015   Diverticulitis of colon without hemorrhage 09/28/2013   Diverticulosis    Gallstones    GERD (gastroesophageal reflux disease)    Hammer toes of both feet    Hyperlipidemia    no meds needed   IBS (irritable bowel syndrome)    Meniscal injury    Tear   Onychomycosis of toenail    Osteopenia    Osteoporosis    PUD (peptic ulcer disease) 1980's   Renal cyst    4.5-6 cm stable single  seen by uro in past   Past Surgical History:  Procedure Laterality Date   CHOLECYSTECTOMY  1987   COLONOSCOPY     IRRIGATION AND DEBRIDEMENT SEBACEOUS CYST Right 3/ 2014   chest wall   KNEE ARTHROSCOPY WITH MENISCAL REPAIR Left 07/30/2022   Procedure: LEFT KNEE ARTHROSCOPY WITH MEDIAL MENISCAL ROOT REPAIR;  Surgeon: Huel Cote, MD;  Location: [facility];  Service: Orthopedics;  Laterality: Left;   UPPER GASTROINTESTINAL ENDOSCOPY     WISDOM TOOTH EXTRACTION  age 9 & 78   lower teeth done first and upper wisdom teeth last   Patient Active Problem List   Diagnosis Date Noted   Acute medial meniscus tear of left knee 07/30/2022   Elevated alkaline phosphatase measurement 09/26/2014   Renal cyst, left 12/31/2013   Osteopenia 12/31/2013   Vitamin D deficiency 12/31/2013   Postmenopausal atrophic  vaginitis 12/31/2013   Family history of colon cancer 10/02/2013   Wrist pain, right 05/13/2013   Hx of fall 05/13/2013   Hypercholesteremia 05/13/2013   History of kidney stones    Musculoskeletal leg pain 02/02/2013   Gait abnormality 02/02/2013   Chest discomfort 05/28/2011   Renal cyst 12/01/2010   Cough 09/27/2010   Neck pain 08/29/2010   Allergic rhinitis, seasonal 08/29/2010   VARICOSE VEINS LOWER EXTREMITIES W/OTH COMPS 12/06/2008   BRACHIAL NEURITIS OR RADICULITIS NOS 10/29/2008   HYPERLIPIDEMIA NEC/NOS 01/10/2007   DEPRESSION 01/08/2007   GERD 01/08/2007   Diverticulosis of colon (without mention of hemorrhage) 07/28/2002     REFERRING PROVIDER: Huel Cote, MD   REFERRING DIAG: M23.92 (ICD-10-CM) - Acute internal derangement of left knee     S/p L knee medial meniscal root repair  THERAPY DIAG:  Left knee pain, unspecified chronicity  Muscle weakness (generalized)  Difficulty in walking, not elsewhere classified  Stiffness of left knee, not elsewhere classified  Rationale for Evaluation and Treatment: Rehabilitation  ONSET DATE: DOS 07/30/2022   SUBJECTIVE:   SUBJECTIVE STATEMENT: Pt is 10 weeks s/p L knee medial meniscal repair.  Pt drove to the clinic  today.  Pt is not using her cane at home as much.  Pt states she forgets to grab her cane and leaves it places.  Pt had some soreness though no adverse effects after prior Rx.  She used ice which took care of her soreness.     PERTINENT HISTORY: -Status post left knee medial meniscal root repair 07/30/2022.  -MRI findings of L knee moderate medial compartment deg changes -Pt has a Hx of Baker's cyst.   -Osteoporosis, OA, and lumbar pain  PAIN:  Are you having pain? Yes NPRS:  0/10 current Location:  L medial knee  PRECAUTIONS: Other: per surgical protocol and Wb'ing restrictions  WEIGHT BEARING RESTRICTIONS: Yes Advance WB'ing  FALLS:  Has patient fallen in last 6 months? No  LIVING  ENVIRONMENT: Lives with: lives alone Lives in: 1 story home Stairs: ramp to enter home.  Pt has 1 step in home with a rail.  Has following equipment at home:  FWW.  Sister has a cane.  OCCUPATION: Pt is retired  PLOF: Independent.  Pt was able to perform her normal functional mobility skills and daily activities independently without difficulty.  Pt ambulated without an AD.  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    PATIENT GOALS: return to [facility]  Next MD visit: 10/12/2022   OBJECTIVE:   DIAGNOSTIC FINDINGS: Pt is post op.  MRI before surgery also showed mod medial compartment deg changes with mild patellofemoral and lateral compartment deg chondrosis and a tiny Baker's cyst.    TODAY'S TREATMENT:    Pt seen for aquatic therapy today.  Treatment took place in water 3.5-4 ft in depth at the [facility] pool. Temp of water was 91.  Pt entered/exited the pool via stairs independently with bilat rail.   Reviewed response to prior Rx, HEP compliance, pain level, and current function. Introduction to water.  Educated pt concerning properties, benefits, and purpose of aquatic therapy.     -Pt ambulated 3 laps, sidestepped 2 laps, and ambulated bwd x 2 laps with verbal and visual instruction for correct form.   -Pt performed:     -marching x 20 reps without UE support     -Squats with UE assist on pool edge 3x10     -Standing HS curl 2x10 L LE      -Standing Hip flex 2x10 reps bilat        -Step ups on aquatic step 2x10 with UE support     -seated bicycles 2x20-30 reps  Pt requires buoyancy for support and to offload joints with strengthening exercises. Viscosity of the water is needed for resistance of strengthening; water current perturbations provides challenge to standing balance unsupported, requiring increased core activation.                                                      PATIENT EDUCATION:  Education details: HEP,  protocol progression, exercise form, relevant anatomy, POC, and gait.  Person educated: Patient and sister Education method: Explanation, Demonstration, Tactile cues, Verbal cues Education comprehension: verbalized understanding, returned demonstration, verbal cues required, tactile cues required, and needs further education  HOME EXERCISE PROGRAM: Access Code: [email or web address] URL: [email or web address]   ASSESSMENT:  CLINICAL IMPRESSION: Pt presents for aquatic therapy today.  PT educated pt concerning the properties,  benefits, and purpose of aquatic therapy.  PT worked on normalized gait with ambulating in the pool.  Pt performed aquatic exercises well with cuing and instruction in correct form.  She had good tolerance with aquatic exercises.  Pt responded well to Rx reporting no pain after Rx.  She should continue to benefit from cont skilled PT services per protocol to improve ROM, strength, gait, and function and to address ongoing goals.  OBJECTIVE IMPAIRMENTS: Abnormal gait, decreased activity tolerance, decreased endurance, decreased knowledge of use of DME, decreased mobility, difficulty walking, decreased ROM, decreased strength, hypomobility, increased edema, impaired flexibility, and pain.   ACTIVITY LIMITATIONS: bending, standing, squatting, stairs, transfers, bed mobility, dressing, and locomotion level  PARTICIPATION LIMITATIONS: meal prep, cleaning, laundry, driving, shopping, community activity, and volunteer work  PERSONAL FACTORS: 3+ comorbidities: moderate deg changes, osteoporosis, and lumbar pain  are also affecting patient's functional outcome.   REHAB POTENTIAL: Good  CLINICAL DECISION MAKING: Stable/uncomplicated  EVALUATION COMPLEXITY: Low   GOALS:  SHORT TERM GOALS: Target date: 08/30/2022  Pt will be independent and compliant with HEP for improved pain, ROM, strength, and function.  Baseline: Goal status: GOAL MET  2.  Pt will progress with  Wb'ing per MD orders without adverse effects for improved mobility.  Baseline:  Goal status: GOAL MET  3.  Pt will demo L knee flexion PROM to be 90 deg for improved stiffness and mobility.  Baseline:  Goal status: achieved   4.  Pt will demo a good quad set and perform a supine SLR independently without significant extensor lag for improved strength.  Baseline:  Goal status:achieved  5.  Pt will be able to perform a 6 inch step up with good form and control.  Baseline:  Goal status: INITIAL Target date:  10/11/2022  6.  Pt will ambulate with a normalized heel to toe gait pattern without AD.  Baseline:  Goal status: INITIAL Target date:  10/25/2022  7.  Pt will demo L knee AROM to 0 - 115 deg for improved mobility and stiffness.  Baseline:  Goal status: INITIAL Target date:  10/25/2022   LONG TERM GOALS: Target date: 11/22/2022   Pt will ambulate extended community distance without difficulty and significant pain.  Baseline:  Goal status: INITIAL  2.  Pt will be able to perform stairs with a reciprocal gait with the rail.   Baseline:  Goal status: INITIAL  3.  Pt will be able to perform her ADLs and IADLs without significant pain and difficulty.  Baseline:  Goal status: INITIAL  4.  Pt will be able to perform her volunteer work without adverse effects.  Baseline:  Goal status: INITIAL  5.  Pt will demo 5/5 strength in L hip flex and abd and at least 4+/5 strength in L knee flex and extension for improved performance of and tolerance with functional mobility.  Baseline:  Goal status: INITIAL     PLAN:  PT FREQUENCY:  1x/wk x 4 weeks and 2x/wk afterwards  PT DURATION: other: 16 weeks  PLANNED INTERVENTIONS: Therapeutic exercises, Therapeutic activity, Neuromuscular re-education, Balance training, Gait training, Patient/Family education, Self Care, Joint mobilization, Stair training, Aquatic Therapy, Dry Needling, Electrical stimulation, Cryotherapy, Moist heat, scar  mobilization, Taping, Ultrasound, Manual therapy, and Re-evaluation  PLAN FOR NEXT SESSION:  Cont per Dr. Serena Croissant meniscus repair protocol.  Work on improving flexion ROM.  Cont with strengthening per protocol.  Assess response to aquatic therapy.  ATC sent message to PT from MD concerning pt  starting aquatic therapy.     Audie Clear III PT, DPT 10/09/22 12:50 PM

## 2022-10-08 ENCOUNTER — Ambulatory Visit (HOSPITAL_BASED_OUTPATIENT_CLINIC_OR_DEPARTMENT_OTHER): Payer: Medicare PPO | Admitting: Physical Therapy

## 2022-10-08 DIAGNOSIS — M25662 Stiffness of left knee, not elsewhere classified: Secondary | ICD-10-CM | POA: Diagnosis not present

## 2022-10-08 DIAGNOSIS — M25562 Pain in left knee: Secondary | ICD-10-CM | POA: Diagnosis not present

## 2022-10-08 DIAGNOSIS — R262 Difficulty in walking, not elsewhere classified: Secondary | ICD-10-CM | POA: Diagnosis not present

## 2022-10-08 DIAGNOSIS — M6281 Muscle weakness (generalized): Secondary | ICD-10-CM

## 2022-10-09 ENCOUNTER — Encounter (HOSPITAL_BASED_OUTPATIENT_CLINIC_OR_DEPARTMENT_OTHER): Payer: Self-pay | Admitting: Physical Therapy

## 2022-10-09 LAB — CYTOLOGY - PAP
Comment: NEGATIVE
Diagnosis: NEGATIVE
High risk HPV: NEGATIVE

## 2022-10-11 ENCOUNTER — Encounter (HOSPITAL_BASED_OUTPATIENT_CLINIC_OR_DEPARTMENT_OTHER): Payer: Self-pay

## 2022-10-11 ENCOUNTER — Ambulatory Visit (HOSPITAL_BASED_OUTPATIENT_CLINIC_OR_DEPARTMENT_OTHER): Payer: Medicare PPO

## 2022-10-11 DIAGNOSIS — R262 Difficulty in walking, not elsewhere classified: Secondary | ICD-10-CM

## 2022-10-11 DIAGNOSIS — M25662 Stiffness of left knee, not elsewhere classified: Secondary | ICD-10-CM | POA: Diagnosis not present

## 2022-10-11 DIAGNOSIS — M25562 Pain in left knee: Secondary | ICD-10-CM | POA: Diagnosis not present

## 2022-10-11 DIAGNOSIS — M6281 Muscle weakness (generalized): Secondary | ICD-10-CM

## 2022-10-11 NOTE — Therapy (Signed)
OUTPATIENT PHYSICAL THERAPY TREATMENT NOTE   Patient Name: [name] [name] MRN: [id number] DOB:12-03-1955, 67 y.o., female Today's Date: 10/11/2022  END OF SESSION:  PT End of Session - 10/11/22 1308     Visit Number 18    Number of Visits 28    Date for PT Re-Evaluation 10/25/22    Authorization Type [employer] MCR    PT Start Time 1304    PT Stop Time 1346    PT Time Calculation (min) 42 min    Activity Tolerance Patient tolerated treatment well    Behavior During Therapy WFL for tasks assessed/performed                      Past Medical History:  Diagnosis Date   Arthritis    Bunion    left foot   Depression    stable on medication   Diverticulitis 2015   Diverticulitis of colon without hemorrhage 09/28/2013   Diverticulosis    Gallstones    GERD (gastroesophageal reflux disease)    Hammer toes of both feet    Hyperlipidemia    no meds needed   IBS (irritable bowel syndrome)    Meniscal injury    Tear   Onychomycosis of toenail    Osteopenia    Osteoporosis    PUD (peptic ulcer disease) 1980's   Renal cyst    4.5-6 cm stable single  seen by uro in past   Past Surgical History:  Procedure Laterality Date   CHOLECYSTECTOMY  1987   COLONOSCOPY     IRRIGATION AND DEBRIDEMENT SEBACEOUS CYST Right 3/ 2014   chest wall   KNEE ARTHROSCOPY WITH MENISCAL REPAIR Left 07/30/2022   Procedure: LEFT KNEE ARTHROSCOPY WITH MEDIAL MENISCAL ROOT REPAIR;  Surgeon: Huel Cote, MD;  Location: [facility];  Service: Orthopedics;  Laterality: Left;   UPPER GASTROINTESTINAL ENDOSCOPY     WISDOM TOOTH EXTRACTION  age 27 & 40   lower teeth done first and upper wisdom teeth last   Patient Active Problem List   Diagnosis Date Noted   Acute medial meniscus tear of left knee 07/30/2022   Elevated alkaline phosphatase measurement 09/26/2014   Renal cyst, left 12/31/2013   Osteopenia 12/31/2013   Vitamin D deficiency 12/31/2013   Postmenopausal atrophic  vaginitis 12/31/2013   Family history of colon cancer 10/02/2013   Wrist pain, right 05/13/2013   Hx of fall 05/13/2013   Hypercholesteremia 05/13/2013   History of kidney stones    Musculoskeletal leg pain 02/02/2013   Gait abnormality 02/02/2013   Chest discomfort 05/28/2011   Renal cyst 12/01/2010   Cough 09/27/2010   Neck pain 08/29/2010   Allergic rhinitis, seasonal 08/29/2010   VARICOSE VEINS LOWER EXTREMITIES W/OTH COMPS 12/06/2008   BRACHIAL NEURITIS OR RADICULITIS NOS 10/29/2008   HYPERLIPIDEMIA NEC/NOS 01/10/2007   DEPRESSION 01/08/2007   GERD 01/08/2007   Diverticulosis of colon (without mention of hemorrhage) 07/28/2002     REFERRING PROVIDER: Huel Cote, MD   REFERRING DIAG: M23.92 (ICD-10-CM) - Acute internal derangement of left knee     S/p L knee medial meniscal root repair  THERAPY DIAG:  Left knee pain, unspecified chronicity  Muscle weakness (generalized)  Difficulty in walking, not elsewhere classified  Stiffness of left knee, not elsewhere classified  Rationale for Evaluation and Treatment: Rehabilitation  ONSET DATE: DOS 07/30/2022   SUBJECTIVE:   SUBJECTIVE STATEMENT: Pt reports she continues to do well without her cane.    PERTINENT HISTORY: -  Status post left knee medial meniscal root repair 07/30/2022.  -MRI findings of L knee moderate medial compartment deg changes -Pt has a Hx of Baker's cyst.   -Osteoporosis, OA, and lumbar pain  PAIN:  Are you having pain? Yes NPRS:  0/10 current Location:  L medial knee  PRECAUTIONS: Other: per surgical protocol and Wb'ing restrictions  WEIGHT BEARING RESTRICTIONS: Yes Advance WB'ing  FALLS:  Has patient fallen in last 6 months? No  LIVING ENVIRONMENT: Lives with: lives alone Lives in: 1 story home Stairs: ramp to enter home.  Pt has 1 step in home with a rail.  Has following equipment at home:  FWW.  Sister has a cane.  OCCUPATION: Pt is retired  PLOF: Independent.  Pt was  able to perform her normal functional mobility skills and daily activities independently without difficulty.  Pt ambulated without an AD.  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    PATIENT GOALS: return to [facility]  Next MD visit: 10/12/2022   OBJECTIVE:   DIAGNOSTIC FINDINGS: Pt is post op.  MRI before surgery also showed mod medial compartment deg changes with mild patellofemoral and lateral compartment deg chondrosis and a tiny Baker's cyst.    TODAY'S TREATMENT:   Therapeutic Exercise: Bike full revolutions x (seat 7) L3 Standing heel raises 2x15 Supine SLR with 2# 2x10 Mini squats 2x10 with UE support (cues for form) Eccentric fwd reach from floor level 2x10 Tandem balance 20sec x2 ea Marching on airex pad 2x10 Hurdles 4 low reciprocal and lateral x4 ea Step ups 6" fwd 2x10 Modified SL bridge  (L more posterior) x15 LAQ 4# 2x10 HSC RTB 2x10  Gait training without SPC 344ft       Pt received patella mobs f/b L knee flexion and extension PROM in supine  PATIENT EDUCATION:  Education details: HEP, protocol progression, exercise form, relevant anatomy, POC, and gait.  Person educated: Patient and sister Education method: Explanation, Demonstration, Tactile cues, Verbal cues Education comprehension: verbalized understanding, returned demonstration, verbal cues required, tactile cues required, and needs further education  HOME EXERCISE PROGRAM: Access Code: [email or web address] URL: [email or web address]   ASSESSMENT:  CLINICAL IMPRESSION: Pt demonstrates safe ambulation without SPC and experiences no unsteadiness/LOB. She is most challenged by eccentric step downs. Has good tolerance for step ups and squats. Some cuing required for proper squat technique. Remains tight into end range knee flexion and encouraged pt to continue stretching this at home, which she states she is doing. Will discuss additional aquatic therapy with  evaluating PT.   OBJECTIVE IMPAIRMENTS: Abnormal gait, decreased activity tolerance, decreased endurance, decreased knowledge of use of DME, decreased mobility, difficulty walking, decreased ROM, decreased strength, hypomobility, increased edema, impaired flexibility, and pain.   ACTIVITY LIMITATIONS: bending, standing, squatting, stairs, transfers, bed mobility, dressing, and locomotion level  PARTICIPATION LIMITATIONS: meal prep, cleaning, laundry, driving, shopping, community activity, and volunteer work  PERSONAL FACTORS: 3+ comorbidities: moderate deg changes, osteoporosis, and lumbar pain  are also affecting patient's functional outcome.   REHAB POTENTIAL: Good  CLINICAL DECISION MAKING: Stable/uncomplicated  EVALUATION COMPLEXITY: Low   GOALS:  SHORT TERM GOALS: Target date: 08/30/2022  Pt will be independent and compliant with HEP for improved pain, ROM, strength, and function.  Baseline: Goal status: GOAL MET  2.  Pt will progress with Wb'ing per MD orders without adverse effects for improved mobility.  Baseline:  Goal status: GOAL MET  3.  Pt will demo L  knee flexion PROM to be 90 deg for improved stiffness and mobility.  Baseline:  Goal status: achieved   4.  Pt will demo a good quad set and perform a supine SLR independently without significant extensor lag for improved strength.  Baseline:  Goal status:achieved  5.  Pt will be able to perform a 6 inch step up with good form and control.  Baseline:  Goal status: INITIAL Target date:  10/11/2022  6.  Pt will ambulate with a normalized heel to toe gait pattern without AD.  Baseline:  Goal status: INITIAL Target date:  10/25/2022  7.  Pt will demo L knee AROM to 0 - 115 deg for improved mobility and stiffness.  Baseline:  Goal status: INITIAL Target date:  10/25/2022   LONG TERM GOALS: Target date: 11/22/2022   Pt will ambulate extended community distance without difficulty and significant pain.  Baseline:   Goal status: INITIAL  2.  Pt will be able to perform stairs with a reciprocal gait with the rail.   Baseline:  Goal status: INITIAL  3.  Pt will be able to perform her ADLs and IADLs without significant pain and difficulty.  Baseline:  Goal status: INITIAL  4.  Pt will be able to perform her volunteer work without adverse effects.  Baseline:  Goal status: INITIAL  5.  Pt will demo 5/5 strength in L hip flex and abd and at least 4+/5 strength in L knee flex and extension for improved performance of and tolerance with functional mobility.  Baseline:  Goal status: INITIAL     PLAN:  PT FREQUENCY:  1x/wk x 4 weeks and 2x/wk afterwards  PT DURATION: other: 16 weeks  PLANNED INTERVENTIONS: Therapeutic exercises, Therapeutic activity, Neuromuscular re-education, Balance training, Gait training, Patient/Family education, Self Care, Joint mobilization, Stair training, Aquatic Therapy, Dry Needling, Electrical stimulation, Cryotherapy, Moist heat, scar mobilization, Taping, Ultrasound, Manual therapy, and Re-evaluation  PLAN FOR NEXT SESSION:  Cont per Dr. Serena Croissant meniscus repair protocol.  Work on improving flexion ROM.  Cont with strengthening per protocol.  Assess response to aquatic therapy.  ATC sent message to PT from MD concerning pt starting aquatic therapy.     Riki Altes, PTA  10/11/22 2:44 PM

## 2022-10-12 ENCOUNTER — Ambulatory Visit (INDEPENDENT_AMBULATORY_CARE_PROVIDER_SITE_OTHER): Payer: Medicare PPO | Admitting: Orthopaedic Surgery

## 2022-10-12 DIAGNOSIS — M23322 Other meniscus derangements, posterior horn of medial meniscus, left knee: Secondary | ICD-10-CM

## 2022-10-12 NOTE — Progress Notes (Signed)
Post Operative Evaluation    Procedure/Date of Surgery: Left knee medial meniscal root repair 3/11  Interval History:   Presents today overall doing extremely well. She has no pain with ambulation.    PMH/PSH/Family History/Social History/Meds/Allergies:    Past Medical History:  Diagnosis Date   Arthritis    Bunion    left foot   Depression    stable on medication   Diverticulitis 2015   Diverticulitis of colon without hemorrhage 09/28/2013   Diverticulosis    Gallstones    GERD (gastroesophageal reflux disease)    Hammer toes of both feet    Hyperlipidemia    no meds needed   IBS (irritable bowel syndrome)    Meniscal injury    Tear   Onychomycosis of toenail    Osteopenia    Osteoporosis    PUD (peptic ulcer disease) 1980's   Renal cyst    4.5-6 cm stable single  seen by uro in past   Past Surgical History:  Procedure Laterality Date   CHOLECYSTECTOMY  1987   COLONOSCOPY     IRRIGATION AND DEBRIDEMENT SEBACEOUS CYST Right 3/ 2014   chest wall   KNEE ARTHROSCOPY WITH MENISCAL REPAIR Left 07/30/2022   Procedure: LEFT KNEE ARTHROSCOPY WITH MEDIAL MENISCAL ROOT REPAIR;  Surgeon: Huel Cote, MD;  Location: [facility];  Service: Orthopedics;  Laterality: Left;   UPPER GASTROINTESTINAL ENDOSCOPY     WISDOM TOOTH EXTRACTION  age 71 & 60   lower teeth done first and upper wisdom teeth last   Social History   Socioeconomic History   Marital status: Divorced    Spouse name: Not on file   Number of children: 0   Years of education: Not on file   Highest education level: Not on file  Occupational History   Occupation: TEACHER    Employer: [employer]  Tobacco Use   Smoking status: Never   Smokeless tobacco: Never  Vaping Use   Vaping Use: Never used  Substance and Sexual Activity   Alcohol use: No   Drug use: No   Sexual activity: Not on file  Other Topics Concern   Not on file  Social History  Narrative   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   Non smoker    Hh of 1 ... 2 cats    Single   G0P0   Mom is now in assisted living and she has moved into her old residence   Social Determinants of Health   Financial Resource Strain: Low Risk  (04/25/2022)   Overall Financial Resource Strain (CARDIA)    Difficulty of Paying Living Expenses: Not hard at all  Food Insecurity: No Food Insecurity (04/25/2022)   Hunger Vital Sign    Worried About Running Out of Food in the Last Year: Never true    Ran Out of Food in the Last Year: Never true  Transportation Needs: No Transportation Needs (04/25/2022)   PRAPARE - Administrator, Civil Service (Medical): No    Lack of Transportation (Non-Medical): No  Physical Activity: Insufficiently Active (04/25/2022)   Exercise Vital Sign    Days of Exercise per Week: 3 days    Minutes of Exercise per Session: 30 min  Stress: Stress Concern Present (04/25/2022)   [employer] of Occupational  Health - Occupational Stress Questionnaire    Feeling of Stress : To some extent  Social Connections: Moderately Integrated (04/25/2022)   Social Connection and Isolation Panel [NHANES]    Frequency of Communication with Friends and Family: More than three times a week    Frequency of Social Gatherings with Friends and Family: More than three times a week    Attends Religious Services: More than 4 times per year    Active Member of [employer] or Organizations: Yes    Attends Engineer, structural: More than 4 times per year    Marital Status: Divorced   Family History  Problem Relation Age of Onset   Osteoarthritis Mother    COPD Mother    Pneumonia Mother    Osteoporosis Mother    Rheum arthritis Father    Pneumonia Father        deceased   Colon cancer Father 37   Heart attack Father    Breast cancer Sister 38   Colon polyps Sister    Cancer Maternal Grandmother        ? GI   Heart failure Maternal  Grandfather    Breast cancer Paternal Grandmother 28   Heart failure Paternal Grandfather    Esophageal cancer Neg Hx    Rectal cancer Neg Hx    Stomach cancer Neg Hx    Allergies  Allergen Reactions   Fosamax [Alendronate Sodium] Other (See Comments)    Intense pain   Tylenol [Acetaminophen] Other (See Comments)    "Irritates my esophagus"   Current Outpatient Medications  Medication Sig Dispense Refill   aspirin EC 325 MG tablet Take 1 tablet (325 mg total) by mouth daily. 30 tablet 0   betamethasone valerate ointment (VALISONE) 0.1 % Apply a pea sized amoutn topically 1-2 x a week as needed 30 g 0   Calcium Carb-Cholecalciferol (CALCIUM 600+D3 PO) Take 1 tablet by mouth daily.     cetirizine (ZYRTEC ALLERGY) 10 MG tablet Take 1 tablet (10 mg total) by mouth at bedtime. 90 tablet 1   denosumab (PROLIA) 60 MG/ML SOSY injection Inject 60 mg into the skin every 6 (six) months.     famotidine (PEPCID) 40 MG tablet Take 1 tablet (40 mg total) by mouth 2 (two) times daily. 60 tablet 1   FLUoxetine (PROZAC) 10 MG tablet Take 5 mg by mouth daily.     FLUoxetine (PROZAC) 40 MG capsule Take 40 mg by mouth daily.     fluticasone (FLONASE) 50 MCG/ACT nasal spray Place 1 spray into both nostrils daily. 47.4 mL 1   ipratropium (ATROVENT) 0.06 % nasal spray Place 2 sprays into both nostrils 3 (three) times daily. As needed for nasal congestion, runny nose 15 mL 1   Multiple Vitamins-Minerals (CENTRUM ULTRA WOMENS PO) Take 1 tablet by mouth every other day.     oxyCODONE (ROXICODONE) 5 MG immediate release tablet Take 1 tablet (5 mg total) by mouth every 4 (four) hours as needed for severe pain or breakthrough pain. 10 tablet 0   Probiotic Product (PROBIOTIC PO) Take by mouth daily.     No current facility-administered medications for this visit.   No results found.  Review of Systems:   A ROS was performed including pertinent positives and negatives as documented in the  HPI.   Musculoskeletal Exam:    Left knee range of motion is from 0 to 135 degrees. No joint line tenderness. Incisions are well-appearing.  No erythema or drainage.  Distal  neurosensory exam is intact.  Trace swelling about the knee  Imaging:      I personally reviewed and interpreted the radiographs.   Assessment:   12 weeks status post left knee medial meniscal repair overall doing extremely well. At this time she is walking without limp and her motion has normalized. She is overall doing extremely well. At this time I will plan to see her back as needed.  Plan :    -Return to clinic as needed      I personally saw and evaluated the patient, and participated in the management and treatment plan.  Huel Cote, MD Attending Physician, Orthopedic Surgery  This document was dictated using Dragon voice recognition software. A reasonable attempt at proof reading has been made to minimize errors.

## 2022-10-16 ENCOUNTER — Ambulatory Visit (HOSPITAL_BASED_OUTPATIENT_CLINIC_OR_DEPARTMENT_OTHER): Payer: Medicare PPO

## 2022-10-16 ENCOUNTER — Encounter (HOSPITAL_BASED_OUTPATIENT_CLINIC_OR_DEPARTMENT_OTHER): Payer: Self-pay

## 2022-10-16 DIAGNOSIS — R262 Difficulty in walking, not elsewhere classified: Secondary | ICD-10-CM

## 2022-10-16 DIAGNOSIS — M6281 Muscle weakness (generalized): Secondary | ICD-10-CM | POA: Diagnosis not present

## 2022-10-16 DIAGNOSIS — M25562 Pain in left knee: Secondary | ICD-10-CM

## 2022-10-16 DIAGNOSIS — M25662 Stiffness of left knee, not elsewhere classified: Secondary | ICD-10-CM

## 2022-10-16 NOTE — Therapy (Signed)
OUTPATIENT PHYSICAL THERAPY TREATMENT NOTE   Patient Name: MILLEY MICHNO MRN: [id number] DOB:1956-04-15, 67 y.o., female Today's Date: 10/16/2022  END OF SESSION:  PT End of Session - 10/16/22 1615     Visit Number 19    Number of Visits 28    Date for PT Re-Evaluation 10/25/22    Authorization Type [employer] MCR    PT Start Time 1515    PT Stop Time 1557    PT Time Calculation (min) 42 min    Activity Tolerance Patient tolerated treatment well    Behavior During Therapy WFL for tasks assessed/performed                       Past Medical History:  Diagnosis Date   Arthritis    Bunion    left foot   Depression    stable on medication   Diverticulitis 2015   Diverticulitis of colon without hemorrhage 09/28/2013   Diverticulosis    Gallstones    GERD (gastroesophageal reflux disease)    Hammer toes of both feet    Hyperlipidemia    no meds needed   IBS (irritable bowel syndrome)    Meniscal injury    Tear   Onychomycosis of toenail    Osteopenia    Osteoporosis    PUD (peptic ulcer disease) 1980's   Renal cyst    4.5-6 cm stable single  seen by uro in past   Past Surgical History:  Procedure Laterality Date   CHOLECYSTECTOMY  1987   COLONOSCOPY     IRRIGATION AND DEBRIDEMENT SEBACEOUS CYST Right 3/ 2014   chest wall   KNEE ARTHROSCOPY WITH MENISCAL REPAIR Left 07/30/2022   Procedure: LEFT KNEE ARTHROSCOPY WITH MEDIAL MENISCAL ROOT REPAIR;  Surgeon: Huel Cote, MD;  [facility]  [facility];  Service: Orthopedics;  Laterality: Left;   UPPER GASTROINTESTINAL ENDOSCOPY     WISDOM TOOTH EXTRACTION  age 62 & 64   lower teeth done first and upper wisdom teeth last   Patient Active Problem List   Diagnosis Date Noted   Acute medial meniscus tear of left knee 07/30/2022   Elevated alkaline phosphatase measurement 09/26/2014   Renal cyst, left 12/31/2013   Osteopenia 12/31/2013   Vitamin D deficiency 12/31/2013   Postmenopausal  atrophic vaginitis 12/31/2013   Family history of colon cancer 10/02/2013   Wrist pain, right 05/13/2013   Hx of fall 05/13/2013   Hypercholesteremia 05/13/2013   History of kidney stones    Musculoskeletal leg pain 02/02/2013   Gait abnormality 02/02/2013   Chest discomfort 05/28/2011   Renal cyst 12/01/2010   Cough 09/27/2010   Neck pain 08/29/2010   Allergic rhinitis, seasonal 08/29/2010   VARICOSE VEINS LOWER EXTREMITIES W/OTH COMPS 12/06/2008   BRACHIAL NEURITIS OR RADICULITIS NOS 10/29/2008   HYPERLIPIDEMIA NEC/NOS 01/10/2007   DEPRESSION 01/08/2007   GERD 01/08/2007   Diverticulosis of colon (without mention of hemorrhage) 07/28/2002     REFERRING PROVIDER: Huel Cote, MD   REFERRING DIAG: M23.92 (ICD-10-CM) - Acute internal derangement of left knee     S/p L knee medial meniscal root repair  THERAPY DIAG:  Left knee pain, unspecified chronicity  Muscle weakness (generalized)  Difficulty in walking, not elsewhere classified  Stiffness of left knee, not elsewhere classified  Rationale for Evaluation and Treatment: Rehabilitation  ONSET DATE: DOS 07/30/2022   SUBJECTIVE:   SUBJECTIVE STATEMENT: Pt reports she continues to do well without her cane.    PERTINENT  HISTORY: -Status post left knee medial meniscal root repair 07/30/2022.  -MRI findings of L knee moderate medial compartment deg changes -Pt has a Hx of Baker's cyst.   -Osteoporosis, OA, and lumbar pain  PAIN:  Are you having pain? Yes NPRS:  0/10 current Location:  L medial knee  PRECAUTIONS: Other: per surgical protocol and Wb'ing restrictions  WEIGHT BEARING RESTRICTIONS: Yes Advance WB'ing  FALLS:  Has patient fallen in last 6 months? No  LIVING ENVIRONMENT: Lives with: lives alone Lives in: 1 story home Stairs: ramp to enter home.  Pt has 1 step in home with a rail.  Has following equipment at home:  FWW.  Sister has a cane.  OCCUPATION: Pt is retired  PLOF: Independent.   Pt was able to perform her normal functional mobility skills and daily activities independently without difficulty.  Pt ambulated without an AD.  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    PATIENT GOALS: return to [facility]  Next MD visit: 10/12/2022   OBJECTIVE:   DIAGNOSTIC FINDINGS: Pt is post op.  MRI before surgery also showed mod medial compartment deg changes with mild patellofemoral and lateral compartment deg chondrosis and a tiny Baker's cyst.    TODAY'S TREATMENT:   Therapeutic Exercise: Bike full revolutions x Standing heel raises 2x15 Supine SLR with 2# 2x10 Eccentric fwd reach from floor level 2x10, from 2" step x15  Step ups bottom stair fwd 2x10 Modified SL bridge  (L more posterior) 2x10 LAQ 5# 2x10 HSC GTB 2x10  Gait training without SPC 29ft   PROM L knee supine  PATIENT EDUCATION:  Education details: HEP, protocol progression, exercise form, relevant anatomy, POC, and gait.  Person educated: Patient and sister Education method: Explanation, Demonstration, Tactile cues, Verbal cues Education comprehension: verbalized understanding, returned demonstration, verbal cues required, tactile cues required, and needs further education  HOME EXERCISE PROGRAM: Access Code: [email or web address] URL: [email or web address]   ASSESSMENT:  CLINICAL IMPRESSION: Pt remains tight into end range knee flexion. She is challenged by step ups without use of hands, but is able to complete this. Pt does require cues for posture with step ups. Pt able to trial eccentric step downs from 2" Step today with overall good tolerance. Pt will benefit from continued eccentric strengthening and focus on stability with concentric and dynamic tasks. Instructed pt to continue working on knee flexion at home.   OBJECTIVE IMPAIRMENTS: Abnormal gait, decreased activity tolerance, decreased endurance, decreased knowledge of use of DME, decreased  mobility, difficulty walking, decreased ROM, decreased strength, hypomobility, increased edema, impaired flexibility, and pain.   ACTIVITY LIMITATIONS: bending, standing, squatting, stairs, transfers, bed mobility, dressing, and locomotion level  PARTICIPATION LIMITATIONS: meal prep, cleaning, laundry, driving, shopping, community activity, and volunteer work  PERSONAL FACTORS: 3+ comorbidities: moderate deg changes, osteoporosis, and lumbar pain  are also affecting patient's functional outcome.   REHAB POTENTIAL: Good  CLINICAL DECISION MAKING: Stable/uncomplicated  EVALUATION COMPLEXITY: Low   GOALS:  SHORT TERM GOALS: Target date: 08/30/2022  Pt will be independent and compliant with HEP for improved pain, ROM, strength, and function.  Baseline: Goal status: GOAL MET  2.  Pt will progress with Wb'ing per MD orders without adverse effects for improved mobility.  Baseline:  Goal status: GOAL MET  3.  Pt will demo L knee flexion PROM to be 90 deg for improved stiffness and mobility.  Baseline:  Goal status: achieved   4.  Pt will demo  a good quad set and perform a supine SLR independently without significant extensor lag for improved strength.  Baseline:  Goal status:achieved  5.  Pt will be able to perform a 6 inch step up with good form and control.  Baseline:  Goal status: INITIAL Target date:  10/11/2022  6.  Pt will ambulate with a normalized heel to toe gait pattern without AD.  Baseline:  Goal status: INITIAL Target date:  10/25/2022  7.  Pt will demo L knee AROM to 0 - 115 deg for improved mobility and stiffness.  Baseline:  Goal status: INITIAL Target date:  10/25/2022   LONG TERM GOALS: Target date: 11/22/2022   Pt will ambulate extended community distance without difficulty and significant pain.  Baseline:  Goal status: INITIAL  2.  Pt will be able to perform stairs with a reciprocal gait with the rail.   Baseline:  Goal status: INITIAL  3.  Pt will be  able to perform her ADLs and IADLs without significant pain and difficulty.  Baseline:  Goal status: INITIAL  4.  Pt will be able to perform her volunteer work without adverse effects.  Baseline:  Goal status: INITIAL  5.  Pt will demo 5/5 strength in L hip flex and abd and at least 4+/5 strength in L knee flex and extension for improved performance of and tolerance with functional mobility.  Baseline:  Goal status: INITIAL     PLAN:  PT FREQUENCY:  1x/wk x 4 weeks and 2x/wk afterwards  PT DURATION: other: 16 weeks  PLANNED INTERVENTIONS: Therapeutic exercises, Therapeutic activity, Neuromuscular re-education, Balance training, Gait training, Patient/Family education, Self Care, Joint mobilization, Stair training, Aquatic Therapy, Dry Needling, Electrical stimulation, Cryotherapy, Moist heat, scar mobilization, Taping, Ultrasound, Manual therapy, and Re-evaluation  PLAN FOR NEXT SESSION:  Cont per Dr. Serena Croissant meniscus repair protocol.  Work on improving flexion ROM.  Cont with strengthening per protocol.  Assess response to aquatic therapy.  ATC sent message to PT from MD concerning pt starting aquatic therapy.     Riki Altes, PTA  10/16/22 4:55 PM

## 2022-10-17 DIAGNOSIS — M810 Age-related osteoporosis without current pathological fracture: Secondary | ICD-10-CM | POA: Diagnosis not present

## 2022-10-18 ENCOUNTER — Encounter (HOSPITAL_BASED_OUTPATIENT_CLINIC_OR_DEPARTMENT_OTHER): Payer: Self-pay

## 2022-10-18 ENCOUNTER — Ambulatory Visit (HOSPITAL_BASED_OUTPATIENT_CLINIC_OR_DEPARTMENT_OTHER): Payer: Medicare PPO

## 2022-10-18 DIAGNOSIS — M6281 Muscle weakness (generalized): Secondary | ICD-10-CM

## 2022-10-18 DIAGNOSIS — R262 Difficulty in walking, not elsewhere classified: Secondary | ICD-10-CM | POA: Diagnosis not present

## 2022-10-18 DIAGNOSIS — M25662 Stiffness of left knee, not elsewhere classified: Secondary | ICD-10-CM

## 2022-10-18 DIAGNOSIS — M25562 Pain in left knee: Secondary | ICD-10-CM | POA: Diagnosis not present

## 2022-10-18 LAB — LAB REPORT - SCANNED: EGFR: 75

## 2022-10-18 NOTE — Therapy (Signed)
OUTPATIENT PHYSICAL THERAPY TREATMENT NOTE   Patient Name: [name] [name] MRN: [id number] DOB:[birth year], 67 y.o., female Today's Date: 10/18/2022  END OF SESSION:  PT End of Session - 10/18/22 1549     Visit Number 20    Number of Visits 28    Date for PT Re-Evaluation 10/25/22    Authorization Type [employer] MCR    PT Start Time 1432    PT Stop Time 1515    PT Time Calculation (min) 43 min    Activity Tolerance Patient tolerated treatment well    Behavior During Therapy WFL for tasks assessed/performed                        Past Medical History:  Diagnosis Date   Arthritis    Bunion    left foot   Depression    stable on medication   Diverticulitis 2015   Diverticulitis of colon without hemorrhage 09/28/2013   Diverticulosis    Gallstones    GERD (gastroesophageal reflux disease)    Hammer toes of both feet    Hyperlipidemia    no meds needed   IBS (irritable bowel syndrome)    Meniscal injury    Tear   Onychomycosis of toenail    Osteopenia    Osteoporosis    PUD (peptic ulcer disease) 1980's   Renal cyst    4.5-6 cm stable single  seen by uro in past   Past Surgical History:  Procedure Laterality Date   CHOLECYSTECTOMY  1987   COLONOSCOPY     IRRIGATION AND DEBRIDEMENT SEBACEOUS CYST Right 3/ 2014   chest wall   KNEE ARTHROSCOPY WITH MENISCAL REPAIR Left 07/30/2022   Procedure: LEFT KNEE ARTHROSCOPY WITH MEDIAL MENISCAL ROOT REPAIR;  Surgeon: Huel Cote, MD;  Location: [facility];  Service: Orthopedics;  Laterality: Left;   UPPER GASTROINTESTINAL ENDOSCOPY     WISDOM TOOTH EXTRACTION  age 62 & 86   lower teeth done first and upper wisdom teeth last   Patient Active Problem List   Diagnosis Date Noted   Acute medial meniscus tear of left knee 07/30/2022   Elevated alkaline phosphatase measurement 09/26/2014   Renal cyst, left 12/31/2013   Osteopenia 12/31/2013   Vitamin D deficiency 12/31/2013   Postmenopausal  atrophic vaginitis 12/31/2013   Family history of colon cancer 10/02/2013   Wrist pain, right 05/13/2013   Hx of fall 05/13/2013   Hypercholesteremia 05/13/2013   History of kidney stones    Musculoskeletal leg pain 02/02/2013   Gait abnormality 02/02/2013   Chest discomfort 05/28/2011   Renal cyst 12/01/2010   Cough 09/27/2010   Neck pain 08/29/2010   Allergic rhinitis, seasonal 08/29/2010   VARICOSE VEINS LOWER EXTREMITIES W/OTH COMPS 12/06/2008   BRACHIAL NEURITIS OR RADICULITIS NOS 10/29/2008   HYPERLIPIDEMIA NEC/NOS 01/10/2007   DEPRESSION 01/08/2007   GERD 01/08/2007   Diverticulosis of colon (without mention of hemorrhage) 07/28/2002     REFERRING PROVIDER: Huel Cote, MD   REFERRING DIAG: M23.92 (ICD-10-CM) - Acute internal derangement of left knee     S/p L knee medial meniscal root repair  THERAPY DIAG:  Left knee pain, unspecified chronicity  Muscle weakness (generalized)  Difficulty in walking, not elsewhere classified  Stiffness of left knee, not elsewhere classified  Rationale for Evaluation and Treatment: Rehabilitation  ONSET DATE: DOS 07/30/2022   SUBJECTIVE:   SUBJECTIVE STATEMENT: Pt reports some pain after volunteering at [employer] last night, but feels  better today.   PERTINENT HISTORY: -Status post left knee medial meniscal root repair 07/30/2022.  -MRI findings of L knee moderate medial compartment deg changes -Pt has a Hx of Baker's cyst.   -Osteoporosis, OA, and lumbar pain  PAIN:  Are you having pain? Yes NPRS:  0/10 current Location:  L medial knee  PRECAUTIONS: Other: per surgical protocol and Wb'ing restrictions  WEIGHT BEARING RESTRICTIONS: Yes Advance WB'ing  FALLS:  Has patient fallen in last 6 months? No  LIVING ENVIRONMENT: Lives with: lives alone Lives in: 1 story home Stairs: ramp to enter home.  Pt has 1 step in home with a rail.  Has following equipment at home:  FWW.  Sister has a cane.  OCCUPATION: Pt  is retired  PLOF: Independent.  Pt was able to perform her normal functional mobility skills and daily activities independently without difficulty.  Pt ambulated without an AD.  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    PATIENT GOALS: return to [facility]  Next MD visit: 10/12/2022   OBJECTIVE:   DIAGNOSTIC FINDINGS: Pt is post op.  MRI before surgery also showed mod medial compartment deg changes with mild patellofemoral and lateral compartment deg chondrosis and a tiny Baker's cyst.    TODAY'S TREATMENT:   Therapeutic Exercise: Bike full revolutions x L3 Standing heel raises 2x15 Supine SLR with 2# 2x10 Eccentric fwd reach from floor level 2x10, from 2" step x15 (try from 4" step next visit) Step ups 6" 2x15 Sit to stands 2x10 (cues for equal WB and slow descent) Modified SL bridge  (L more posterior) 2x10 LAQ 5# 2x10  SLDL to 8" with 4 cones stacked on top- x10 (cues for technique).   Gait training without SPC 367ft PROM L knee supine  PATIENT EDUCATION:  Education details: HEP, protocol progression, exercise form, relevant anatomy, POC, and gait.  Person educated: Patient and sister Education method: Explanation, Demonstration, Tactile cues, Verbal cues Education comprehension: verbalized understanding, returned demonstration, verbal cues required, tactile cues required, and needs further education  HOME EXERCISE PROGRAM: Access Code: [email or web address] URL: [email or web address]   ASSESSMENT:  CLINICAL IMPRESSION: Pt demonstrates excellent tolerance for strengthening progressions. She does require cues for equal weightbearing with sit to stands as well as for proper foot placement. She is most challenged by eccentric step downs, but able to complete from 2" step without significant discomfort. Will try 4" step next visit to progress towards reciprocal stair climbing. Trialed SLDL to elevated surface with pt demonstrating  some unsteadiness.  OBJECTIVE IMPAIRMENTS: Abnormal gait, decreased activity tolerance, decreased endurance, decreased knowledge of use of DME, decreased mobility, difficulty walking, decreased ROM, decreased strength, hypomobility, increased edema, impaired flexibility, and pain.   ACTIVITY LIMITATIONS: bending, standing, squatting, stairs, transfers, bed mobility, dressing, and locomotion level  PARTICIPATION LIMITATIONS: meal prep, cleaning, laundry, driving, shopping, community activity, and volunteer work  PERSONAL FACTORS: 3+ comorbidities: moderate deg changes, osteoporosis, and lumbar pain  are also affecting patient's functional outcome.   REHAB POTENTIAL: Good  CLINICAL DECISION MAKING: Stable/uncomplicated  EVALUATION COMPLEXITY: Low   GOALS:  SHORT TERM GOALS: Target date: 08/30/2022  Pt will be independent and compliant with HEP for improved pain, ROM, strength, and function.  Baseline: Goal status: GOAL MET  2.  Pt will progress with Wb'ing per MD orders without adverse effects for improved mobility.  Baseline:  Goal status: GOAL MET  3.  Pt will demo L knee flexion PROM to be  90 deg for improved stiffness and mobility.  Baseline:  Goal status: achieved   4.  Pt will demo a good quad set and perform a supine SLR independently without significant extensor lag for improved strength.  Baseline:  Goal status:achieved  5.  Pt will be able to perform a 6 inch step up with good form and control.  Baseline:  Goal status: INITIAL Target date:  10/11/2022  6.  Pt will ambulate with a normalized heel to toe gait pattern without AD.  Baseline:  Goal status: INITIAL Target date:  10/25/2022  7.  Pt will demo L knee AROM to 0 - 115 deg for improved mobility and stiffness.  Baseline:  Goal status: INITIAL Target date:  10/25/2022   LONG TERM GOALS: Target date: 11/22/2022   Pt will ambulate extended community distance without difficulty and significant pain.  Baseline:   Goal status: INITIAL  2.  Pt will be able to perform stairs with a reciprocal gait with the rail.   Baseline:  Goal status: INITIAL  3.  Pt will be able to perform her ADLs and IADLs without significant pain and difficulty.  Baseline:  Goal status: INITIAL  4.  Pt will be able to perform her volunteer work without adverse effects.  Baseline:  Goal status: INITIAL  5.  Pt will demo 5/5 strength in L hip flex and abd and at least 4+/5 strength in L knee flex and extension for improved performance of and tolerance with functional mobility.  Baseline:  Goal status: INITIAL     PLAN:  PT FREQUENCY:  1x/wk x 4 weeks and 2x/wk afterwards  PT DURATION: other: 16 weeks  PLANNED INTERVENTIONS: Therapeutic exercises, Therapeutic activity, Neuromuscular re-education, Balance training, Gait training, Patient/Family education, Self Care, Joint mobilization, Stair training, Aquatic Therapy, Dry Needling, Electrical stimulation, Cryotherapy, Moist heat, scar mobilization, Taping, Ultrasound, Manual therapy, and Re-evaluation  PLAN FOR NEXT SESSION:  Cont per Dr. Serena Croissant meniscus repair protocol.  Work on improving flexion ROM.  Cont with strengthening per protocol.  Assess response to aquatic therapy.  ATC sent message to PT from MD concerning pt starting aquatic therapy.     Riki Altes, PTA  10/18/22 4:29 PM

## 2022-10-19 DIAGNOSIS — R351 Nocturia: Secondary | ICD-10-CM | POA: Diagnosis not present

## 2022-10-19 DIAGNOSIS — N281 Cyst of kidney, acquired: Secondary | ICD-10-CM | POA: Diagnosis not present

## 2022-10-21 NOTE — Therapy (Addendum)
OUTPATIENT PHYSICAL THERAPY TREATMENT NOTE / PROGRESS NOTE  Progress Note Reporting Period 09/18/2022 to 10/22/2022  See note below for Objective Data and Assessment of Progress/Goals.       Patient Name: [name] [name] MRN: [id number] DOB:09-01-1955, 67 y.o., female Today's Date: 10/23/2022  END OF SESSION:  PT End of Session - 10/22/22 1549       Visit Number 21     Number of Visits 31     Date for PT Re-Evaluation 11/26/22     Authorization Type [employer] MCR     PT Start Time 1453    PT Stop Time 1538     PT Time Calculation (min) 45 min     Activity Tolerance Patient tolerated treatment well     Behavior During Therapy WFL for tasks assessed/performed                Past Medical History:  Diagnosis Date   Arthritis    Bunion    left foot   Depression    stable on medication   Diverticulitis 2015   Diverticulitis of colon without hemorrhage 09/28/2013   Diverticulosis    Gallstones    GERD (gastroesophageal reflux disease)    Hammer toes of both feet    Hyperlipidemia    no meds needed   IBS (irritable bowel syndrome)    Meniscal injury    Tear   Onychomycosis of toenail    Osteopenia    Osteoporosis    PUD (peptic ulcer disease) 1980's   Renal cyst    4.5-6 cm stable single  seen by uro in past   Past Surgical History:  Procedure Laterality Date   CHOLECYSTECTOMY  1987   COLONOSCOPY     IRRIGATION AND DEBRIDEMENT SEBACEOUS CYST Right 3/ 2014   chest wall   KNEE ARTHROSCOPY WITH MENISCAL REPAIR Left 07/30/2022   Procedure: LEFT KNEE ARTHROSCOPY WITH MEDIAL MENISCAL ROOT REPAIR;  Surgeon: Huel Cote, MD;  Location: [facility];  Service: Orthopedics;  Laterality: Left;   UPPER GASTROINTESTINAL ENDOSCOPY     WISDOM TOOTH EXTRACTION  age 16 & 70   lower teeth done first and upper wisdom teeth last   Patient Active Problem List   Diagnosis Date Noted   Acute medial meniscus tear of left knee 07/30/2022   Elevated alkaline  phosphatase measurement 09/26/2014   Renal cyst, left 12/31/2013   Osteopenia 12/31/2013   Vitamin D deficiency 12/31/2013   Postmenopausal atrophic vaginitis 12/31/2013   Family history of colon cancer 10/02/2013   Wrist pain, right 05/13/2013   Hx of fall 05/13/2013   Hypercholesteremia 05/13/2013   History of kidney stones    Musculoskeletal leg pain 02/02/2013   Gait abnormality 02/02/2013   Chest discomfort 05/28/2011   Renal cyst 12/01/2010   Cough 09/27/2010   Neck pain 08/29/2010   Allergic rhinitis, seasonal 08/29/2010   VARICOSE VEINS LOWER EXTREMITIES W/OTH COMPS 12/06/2008   BRACHIAL NEURITIS OR RADICULITIS NOS 10/29/2008   HYPERLIPIDEMIA NEC/NOS 01/10/2007   DEPRESSION 01/08/2007   GERD 01/08/2007   Diverticulosis of colon (without mention of hemorrhage) 07/28/2002     REFERRING PROVIDER: Huel Cote, MD   REFERRING DIAG: M23.92 (ICD-10-CM) - Acute internal derangement of left knee     S/p L knee medial meniscal root repair  THERAPY DIAG:  Left knee pain, unspecified chronicity  Muscle weakness (generalized)  Difficulty in walking, not elsewhere classified  Stiffness of left knee, not elsewhere classified  Rationale for Evaluation and  Treatment: Rehabilitation  ONSET DATE: DOS 07/30/2022   SUBJECTIVE:   SUBJECTIVE STATEMENT: Pt is 12 weeks s/p L knee medial meniscal root repair.  Pt reports compliance with HEP.  Pt denies any adverse effects after prior Rx, just a little soreness.  Pt states MD has released her from his care.    FUNCTIONAL IMPROVEMENTS:  bending knee, stand to sit transfer, up/down 1 step inside home, and household chores.  Pt is ambulating increased distance without cane.  She does have soreness with ambulation.  Driving.  Pt was able to perform part time work 1 day last week and was on her feet for 2 hours. FUNCTIONAL LIMITATIONS:  stairs, standing on concrete for a long period of time, ambulation distance.  PERTINENT  HISTORY: -Status post left knee medial meniscal root repair 07/30/2022.  -MRI findings of L knee moderate medial compartment deg changes -Pt has a Hx of Baker's cyst.   -Osteoporosis, OA, and lumbar pain  PAIN:  Are you having pain? Yes NPRS:  0/10 current, 2/10 worst Location:  L medial knee  PRECAUTIONS: Other: per surgical protocol and Wb'ing restrictions  WEIGHT BEARING RESTRICTIONS: Yes Advance WB'ing  FALLS:  Has patient fallen in last 6 months? No  LIVING ENVIRONMENT: Lives with: lives alone Lives in: 1 story home Stairs: ramp to enter home.  Pt has 1 step in home with a rail.  Has following equipment at home:  FWW.  Sister has a cane.  OCCUPATION: Pt is retired  PLOF: Independent.  Pt was able to perform her normal functional mobility skills and daily activities independently without difficulty.  Pt ambulated without an AD.  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    PATIENT GOALS: return to [facility]  Next MD visit: 10/12/2022   OBJECTIVE:   DIAGNOSTIC FINDINGS: Pt is post op.  MRI before surgery also showed mod medial compartment deg changes with mild patellofemoral and lateral compartment deg chondrosis and a tiny Baker's cyst.    TODAY'S TREATMENT:   Gait:  Pt ambulates with a good heel to toe gait pattern with minimally limited TKE.  Pt has no limp with gait and only favors L LE slightly.  Stairs:  pt ascended stairs with a reciprocal gait with the rail and descended stairs with a step to gait with the rail.   L knee AROM/PROM:  2/0 - 106/112 deg  L LE strength:  Hip flex:  4+/5, Hip abd:  5/5, Hip ext:  4-/5.  Knee extension:  4/5.  FOTO:  Prior/Current:  57 / 67.  Goal of 73  Reviewed current function, HEP compliance, pain levels, and response to prior Rx.   Bike full revolutions x 6 min L3 Step ups 6" 2x15 Lateral band walks with RTB around thighs 2x10 LAQ 5# 2x10     PATIENT EDUCATION:  Education details:  HEP, protocol progression, exercise form, relevant anatomy, objective findings, POC, and gait.  Person educated: Patient and sister Education method: Explanation, Demonstration, Tactile cues, Verbal cues Education comprehension: verbalized understanding, returned demonstration, verbal cues required, tactile cues required, and needs further education  HOME EXERCISE PROGRAM: Access Code: [email or web address] URL: [email or web address]   ASSESSMENT:  CLINICAL IMPRESSION: Pt is making good progress in all areas including gait, strength, ROM, and function.  She reports improved functional mobility including stand to sit transfers, performing her 1 step inside home, driving, and household chores.  Pt is ambulating increased distance without cane and was able  to perform her part time work 1 day last week.  She still has functional limitations including stairs and ambulation distance.  Pt continues to have some stiffness and limitations with knee ROM though is making progress.  Pt is improving with strength as evidenced by MMT and performance and progression of exercises per protocol.  Pt demonstrates improved quality of gait and performance of stairs.  She demonstrates improved self perceived disability with FOTO score improving by 10 points.  Pt has met STG's #1-5 and has partially met or progressing toward other goals.  She should benefit from cont skilled PT services to address ongoing goals and to assist in restoring desired level of function.      OBJECTIVE IMPAIRMENTS: Abnormal gait, decreased activity tolerance, decreased endurance, decreased knowledge of use of DME, decreased mobility, difficulty walking, decreased ROM, decreased strength, hypomobility, increased edema, impaired flexibility, and pain.   ACTIVITY LIMITATIONS: bending, standing, squatting, stairs, transfers, bed mobility, dressing, and locomotion level  PARTICIPATION LIMITATIONS: meal prep, cleaning, laundry, driving, shopping,  community activity, and volunteer work  PERSONAL FACTORS: 3+ comorbidities: moderate deg changes, osteoporosis, and lumbar pain  are also affecting patient's functional outcome.   REHAB POTENTIAL: Good  CLINICAL DECISION MAKING: Stable/uncomplicated  EVALUATION COMPLEXITY: Low   GOALS:  SHORT TERM GOALS: Target date: 08/30/2022  Pt will be independent and compliant with HEP for improved pain, ROM, strength, and function.  Baseline: Goal status: GOAL MET  2.  Pt will progress with Wb'ing per MD orders without adverse effects for improved mobility.  Baseline:  Goal status: GOAL MET  3.  Pt will demo L knee flexion PROM to be 90 deg for improved stiffness and mobility.  Baseline:  Goal status: achieved   4.  Pt will demo a good quad set and perform a supine SLR independently without significant extensor lag for improved strength.  Baseline:  Goal status:achieved  5.  Pt will be able to perform a 6 inch step up with good form and control.  Baseline:  Goal status: GOAL MET Target date:  10/11/2022  6.  Pt will ambulate with a normalized heel to toe gait pattern without AD.  Baseline:  Goal status:  PARTIALLY MET Target date:  10/25/2022  7.  Pt will demo L knee AROM to 0 - 115 deg for improved mobility and stiffness.  Baseline:  Goal status: PROGRESSING Target date:  10/25/2022   LONG TERM GOALS: Target date: 11/26/2022   Pt will ambulate extended community distance without difficulty and significant pain.  Baseline:  Goal status: PROGRESSING  2.  Pt will be able to perform stairs with a reciprocal gait with the rail.   Baseline:  Goal status:  PARTIALLY MET  3.  Pt will be able to perform her ADLs and IADLs without significant pain and difficulty.  Baseline:  Goal status: PROGRESSING  4.  Pt will be able to perform her volunteer work without adverse effects.  Baseline:  Goal status: ONGOING  5.  Pt will demo 5/5 strength in L hip flex and abd and at least 4+/5  strength in L knee flex and extension for improved performance of and tolerance with functional mobility.  Baseline:  Goal status: PROGRESSING     PLAN:  PT FREQUENCY:  2x/wk  PT DURATION: other: 5 weeks  PLANNED INTERVENTIONS: Therapeutic exercises, Therapeutic activity, Neuromuscular re-education, Balance training, Gait training, Patient/Family education, Self Care, Joint mobilization, Stair training, Aquatic Therapy, Dry Needling, Electrical stimulation, Cryotherapy, Moist heat, scar mobilization, Taping,  Ultrasound, Manual therapy, and Re-evaluation  PLAN FOR NEXT SESSION:  Cont per Dr. Serena Croissant meniscus repair protocol.  Work on improving flexion ROM.  Cont with strengthening per protocol.    Referring diagnosis?  M23.92 (ICD-10-CM) - Acute internal derangement of left knee    Treatment diagnosis? (if different than referring diagnosis)    Left knee pain, unspecified chronicity   Stiffness of left knee, not elsewhere classified   Muscle weakness (generalized)   Difficulty in walking, not elsewhere classified What was this (referring dx) caused by? []  Surgery []  Fall []  Ongoing issue []  Arthritis []  Other: ____________   Laterality: []  Rt [x]  Lt []  Both   Check all possible CPT codes:            *CHOOSE 10 OR LESS*                          [x]  97110 (Therapeutic Exercise)            []  92507 (SLP Treatment)  [x]  97112 (Neuro Re-ed)                         []  92526 (Swallowing Treatment)             [x]  97116 (Gait Training)                           []  [id number] (Cognitive Training, 1st 15 minutes) [x]  97140 (Manual Therapy)                               []  97130 (Cognitive Training, each add'l 15 minutes)  [x]  97164 (Re-evaluation)                              []  Other, List CPT Code ____________  [x]  97530 (Therapeutic Activities)                                             [x]  69629 (Self Care)                    []  All codes above (97110 - 97535)           []   97012 (Mechanical Traction)           [x]  97014 (E-stim Unattended)           [x]  97032 (E-stim manual)           []  97033 (Ionto)           [x]  97035 (Ultrasound) [x]  97750 (Physical Performance Training) [x]  [id number] (Aquatic Therapy) [x]  97016 (Vasopneumatic Device) []  [id number] (Paraffin) []  97034 (Contrast Bath) []  97597 (Wound Care 1st 20 sq cm) []  97598 (Wound Care each add'l 20 sq cm) []  97760 (Orthotic Fabrication, Fitting, Training Initial) []  [id number] (Prosthetic Management and Training Initial) []  [id number] (Orthotic or Prosthetic Training/ Modification Subsequent)    Audie Clear III PT, DPT 10/24/22 12:04 AM

## 2022-10-22 ENCOUNTER — Ambulatory Visit (HOSPITAL_BASED_OUTPATIENT_CLINIC_OR_DEPARTMENT_OTHER): Payer: Medicare PPO | Attending: Orthopaedic Surgery | Admitting: Physical Therapy

## 2022-10-22 DIAGNOSIS — R262 Difficulty in walking, not elsewhere classified: Secondary | ICD-10-CM | POA: Insufficient documentation

## 2022-10-22 DIAGNOSIS — M6281 Muscle weakness (generalized): Secondary | ICD-10-CM | POA: Diagnosis not present

## 2022-10-22 DIAGNOSIS — M25662 Stiffness of left knee, not elsewhere classified: Secondary | ICD-10-CM | POA: Diagnosis not present

## 2022-10-22 DIAGNOSIS — M25562 Pain in left knee: Secondary | ICD-10-CM | POA: Insufficient documentation

## 2022-10-23 ENCOUNTER — Encounter (HOSPITAL_BASED_OUTPATIENT_CLINIC_OR_DEPARTMENT_OTHER): Payer: Self-pay | Admitting: Physical Therapy

## 2022-10-24 ENCOUNTER — Ambulatory Visit (HOSPITAL_BASED_OUTPATIENT_CLINIC_OR_DEPARTMENT_OTHER): Payer: Medicare PPO | Admitting: Physical Therapy

## 2022-10-24 DIAGNOSIS — M25562 Pain in left knee: Secondary | ICD-10-CM

## 2022-10-24 DIAGNOSIS — M6281 Muscle weakness (generalized): Secondary | ICD-10-CM | POA: Diagnosis not present

## 2022-10-24 DIAGNOSIS — M25662 Stiffness of left knee, not elsewhere classified: Secondary | ICD-10-CM

## 2022-10-24 DIAGNOSIS — R262 Difficulty in walking, not elsewhere classified: Secondary | ICD-10-CM | POA: Diagnosis not present

## 2022-10-24 NOTE — Therapy (Unsigned)
OUTPATIENT PHYSICAL THERAPY TREATMENT NOTE / PROGRESS NOTE  Progress Note Reporting Period 09/18/2022 to 10/22/2022  See note below for Objective Data and Assessment of Progress/Goals.       Patient Name: [name] [name] MRN: [id number] DOB:16-Oct-1955, 67 y.o., female Today's Date: 10/23/2022  END OF SESSION:  PT End of Session - 10/22/22 1549       Visit Number 21     Number of Visits 31     Date for PT Re-Evaluation 11/26/22     Authorization Type [employer] M[employer]     PT Start Time 1453    PT Stop Time 1538     PT Time Calculation (min) 45 min     Activity Tolerance Patient tolerated treatment well     Behavior During Therapy WFL for tasks assessed/performed                Past Medical History:  Diagnosis Date   Arthritis    Bunion    left foot   Depression    stable on medication   Diverticulitis 2015   Diverticulitis of colon without hemorrhage 09/28/2013   Diverticulosis    Gallstones    GERD (gastroesophageal reflux disease)    Hammer toes of both feet    Hyperlipidemia    no meds needed   IBS (irritable bowel syndrome)    Meniscal injury    Tear   Onychomycosis of toenail    Osteopenia    Osteoporosis    PUD (peptic ulcer disease) 1980's   Renal cyst    4.5-6 cm stable single  seen by uro in past   Past Surgical History:  Procedure Laterality Date   CHOLECYSTECTOMY  1987   COLONOSCOPY     IRRIGATION AND DEBRIDEMENT SEBACEOUS CYST Right 3/ 2014   chest wall   KNEE ARTHROSCOPY WITH MENISCAL REPAIR Left 07/30/2022   Procedure: LEFT KNEE ARTHROSCOPY WITH MEDIAL MENISCAL ROOT REPAIR;  Surgeon: Huel Cote, MD;  Location: [facility];  Service: Orthopedics;  Laterality: Left;   UPPER GASTROINTESTINAL ENDOSCOPY     WISDOM TOOTH EXTRACTION  age 68 & 57   lower teeth done first and upper wisdom teeth last   Patient Active Problem List   Diagnosis Date Noted   Acute medial meniscus tear of left knee 07/30/2022   Elevated alkaline  phosphatase measurement 09/26/2014   Renal cyst, left 12/31/2013   Osteopenia 12/31/2013   Vitamin D deficiency 12/31/2013   Postmenopausal atrophic vaginitis 12/31/2013   Family history of colon cancer 10/02/2013   Wrist pain, right 05/13/2013   Hx of fall 05/13/2013   Hypercholesteremia 05/13/2013   History of kidney stones    Musculoskeletal leg pain 02/02/2013   Gait abnormality 02/02/2013   Chest discomfort 05/28/2011   Renal cyst 12/01/2010   Cough 09/27/2010   Neck pain 08/29/2010   Allergic rhinitis, seasonal 08/29/2010   VARICOSE VEINS LOWER EXTREMITIES W/OTH COMPS 12/06/2008   BRACHIAL NEURITIS OR RADICULITIS NOS 10/29/2008   HYPERLIPIDEMIA NEC/NOS 01/10/2007   DEPRESSION 01/08/2007   GERD 01/08/2007   Diverticulosis of colon (without mention of hemorrhage) 07/28/2002     REFERRING PROVIDER: Huel Cote, MD   REFERRING DIAG: M23.92 (ICD-10-CM) - Acute internal derangement of left knee     S/p L knee medial meniscal root repair  THERAPY DIAG:  Left knee pain, unspecified chronicity  Muscle weakness (generalized)  Difficulty in walking, not elsewhere classified  Stiffness of left knee, not elsewhere classified  Rationale for Evaluation and  Treatment: Rehabilitation  ONSET DATE: DOS 07/30/2022   SUBJECTIVE:   SUBJECTIVE STATEMENT: Pt is 12 weeks and 2 days s/p L knee medial meniscal root repair.  Pt reports compliance with HEP.  Pt states she used ice after prior Rx and had no increased pain.  Pt states she went to her part time job at [employer] today and states it was easier today than last time.  Pt was able to stand longer.    FUNCTIONAL IMPROVEMENTS:  bending knee, stand to sit transfer, up/down 1 step inside home, and household chores.  Pt is ambulating increased distance without cane.  She does have soreness with ambulation.  Driving.  FUNCTIONAL LIMITATIONS:  stairs, standing on concrete for a long period of time, ambulation distance.  PERTINENT  HISTORY: -Status post left knee medial meniscal root repair 07/30/2022.  -MRI findings of L knee moderate medial compartment deg changes -Pt has a Hx of Baker's cyst.   -Osteoporosis, OA, and lumbar pain  PAIN:  Are you having pain? Yes NPRS:  0/10 current, 2/10 worst Location:  L medial knee  PRECAUTIONS: Other: per surgical protocol and Wb'ing restrictions  WEIGHT BEARING RESTRICTIONS: Yes Advance WB'ing  FALLS:  Has patient fallen in last 6 months? No  LIVING ENVIRONMENT: Lives with: lives alone Lives in: 1 story home Stairs: ramp to enter home.  Pt has 1 step in home with a rail.  Has following equipment at home:  FWW.  Sister has a cane.  OCCUPATION: Pt is retired  PLOF: Independent.  Pt was able to perform her normal functional mobility skills and daily activities independently without difficulty.  Pt ambulated without an AD.  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    PATIENT GOALS: return to [facility]  Next MD visit: 10/12/2022   OBJECTIVE:   DIAGNOSTIC FINDINGS: Pt is post op.  MRI before surgery also showed mod medial compartment deg changes with mild patellofemoral and lateral compartment deg chondrosis and a tiny Baker's cyst.    TODAY'S TREATMENT:   Reviewed current function, HEP compliance, pain levels, and response to prior Rx.   Therapeutic Exercise: Bike full revolutions x 6 min L3 Supine SLR with 2# 2x10 Step downs 2 inch step x10 and 4 inch step x 10 reps Step ups 6" 2x15 Modified SL bridge  (L more posterior) 2x10 LAQ 5# 2x10 Lateral band walks with GTB around thighs 2x10 Cybex leg press 30# 2x10 bilat Seated HS curl with RTB 2x10    Pt received L knee flex/ext PROM in supine      PATIENT EDUCATION:  Education details: HEP, protocol progression, exercise form, relevant anatomy, objective findings, POC, and gait.  Person educated: Patient and sister Education method: Explanation, Demonstration, Tactile  cues, Verbal cues Education comprehension: verbalized understanding, returned demonstration, verbal cues required, tactile cues required, and needs further education  HOME EXERCISE PROGRAM: Access Code: [email or web address] URL: [email or web address]   ASSESSMENT:  CLINICAL IMPRESSION: Pt is making good progress in all areas including gait, strength, ROM, and function.  She reports improved functional mobility including stand to sit transfers, performing her 1 step inside home, driving, and household chores.  Pt is ambulating increased distance without cane and was able to perform her part time work 1 day last week.  She still has functional limitations including stairs and ambulation distance.  Pt continues to have some stiffness and limitations with knee ROM though is making progress.  Pt is improving with  strength as evidenced by MMT and performance and progression of exercises per protocol.  Pt demonstrates improved quality of gait and performance of stairs.  She demonstrates improved self perceived disability with FOTO score improving by 10 points.  Pt has met STG's #1-5 and has partially met or progressing toward other goals.  She should benefit from cont skilled PT services to address ongoing goals and to assist in restoring desired level of function.   Verbal cuing to slow down and control movements     OBJECTIVE IMPAIRMENTS: Abnormal gait, decreased activity tolerance, decreased endurance, decreased knowledge of use of DME, decreased mobility, difficulty walking, decreased ROM, decreased strength, hypomobility, increased edema, impaired flexibility, and pain.   ACTIVITY LIMITATIONS: bending, standing, squatting, stairs, transfers, bed mobility, dressing, and locomotion level  PARTICIPATION LIMITATIONS: meal prep, cleaning, laundry, driving, shopping, community activity, and volunteer work  PERSONAL FACTORS: 3+ comorbidities: moderate deg changes, osteoporosis, and lumbar pain  are  also affecting patient's functional outcome.   REHAB POTENTIAL: Good  CLINICAL DECISION MAKING: Stable/uncomplicated  EVALUATION COMPLEXITY: Low   GOALS:  SHORT TERM GOALS: Target date: 08/30/2022  Pt will be independent and compliant with HEP for improved pain, ROM, strength, and function.  Baseline: Goal status: GOAL MET  2.  Pt will progress with Wb'ing per MD orders without adverse effects for improved mobility.  Baseline:  Goal status: GOAL MET  3.  Pt will demo L knee flexion PROM to be 90 deg for improved stiffness and mobility.  Baseline:  Goal status: achieved   4.  Pt will demo a good quad set and perform a supine SLR independently without significant extensor lag for improved strength.  Baseline:  Goal status:achieved  5.  Pt will be able to perform a 6 inch step up with good form and control.  Baseline:  Goal status: GOAL MET Target date:  10/11/2022  6.  Pt will ambulate with a normalized heel to toe gait pattern without AD.  Baseline:  Goal status:  PARTIALLY MET Target date:  10/25/2022  7.  Pt will demo L knee AROM to 0 - 115 deg for improved mobility and stiffness.  Baseline:  Goal status: PROGRESSING Target date:  10/25/2022   LONG TERM GOALS: Target date: 11/26/2022   Pt will ambulate extended community distance without difficulty and significant pain.  Baseline:  Goal status: PROGRESSING  2.  Pt will be able to perform stairs with a reciprocal gait with the rail.   Baseline:  Goal status:  PARTIALLY MET  3.  Pt will be able to perform her ADLs and IADLs without significant pain and difficulty.  Baseline:  Goal status: PROGRESSING  4.  Pt will be able to perform her volunteer work without adverse effects.  Baseline:  Goal status: ONGOING  5.  Pt will demo 5/5 strength in L hip flex and abd and at least 4+/5 strength in L knee flex and extension for improved performance of and tolerance with functional mobility.  Baseline:  Goal status:  PROGRESSING     PLAN:  PT FREQUENCY:  2x/wk  PT DURATION: other: 5 weeks  PLANNED INTERVENTIONS: Therapeutic exercises, Therapeutic activity, Neuromuscular re-education, Balance training, Gait training, Patient/Family education, Self Care, Joint mobilization, Stair training, Aquatic Therapy, Dry Needling, Electrical stimulation, Cryotherapy, Moist heat, scar mobilization, Taping, Ultrasound, Manual therapy, and Re-evaluation  PLAN FOR NEXT SESSION:  Cont per Dr. Serena Croissant meniscus repair protocol.  Work on improving flexion ROM.  Cont with strengthening per protocol.  Audie Clear III PT, DPT 10/24/22 12:04 AM

## 2022-10-25 ENCOUNTER — Encounter (HOSPITAL_BASED_OUTPATIENT_CLINIC_OR_DEPARTMENT_OTHER): Payer: Self-pay | Admitting: Physical Therapy

## 2022-10-30 ENCOUNTER — Encounter (HOSPITAL_BASED_OUTPATIENT_CLINIC_OR_DEPARTMENT_OTHER): Payer: Self-pay | Admitting: Physical Therapy

## 2022-10-30 ENCOUNTER — Ambulatory Visit (HOSPITAL_BASED_OUTPATIENT_CLINIC_OR_DEPARTMENT_OTHER): Payer: Medicare PPO | Admitting: Physical Therapy

## 2022-10-30 DIAGNOSIS — R262 Difficulty in walking, not elsewhere classified: Secondary | ICD-10-CM

## 2022-10-30 DIAGNOSIS — M6281 Muscle weakness (generalized): Secondary | ICD-10-CM

## 2022-10-30 DIAGNOSIS — M25562 Pain in left knee: Secondary | ICD-10-CM

## 2022-10-30 DIAGNOSIS — M25662 Stiffness of left knee, not elsewhere classified: Secondary | ICD-10-CM | POA: Diagnosis not present

## 2022-10-30 NOTE — Therapy (Signed)
OUTPATIENT PHYSICAL THERAPY TREATMENT NOTE        Patient Name: [name] [name] MRN: [id number] DOB:1955-10-01, 67 y.o., female Today's Date: 10/30/2022  END OF SESSION:  PT End of Session - 10/30/22 1611     Visit Number 23    Number of Visits 31    Date for PT Re-Evaluation 11/26/22    Authorization Type [employer] MCR    PT Start Time 1545    PT Stop Time 1630    PT Time Calculation (min) 45 min    Activity Tolerance Patient tolerated treatment well    Behavior During Therapy WFL for tasks assessed/performed                           Past Medical History:  Diagnosis Date   Arthritis    Bunion    left foot   Depression    stable on medication   Diverticulitis 2015   Diverticulitis of colon without hemorrhage 09/28/2013   Diverticulosis    Gallstones    GERD (gastroesophageal reflux disease)    Hammer toes of both feet    Hyperlipidemia    no meds needed   IBS (irritable bowel syndrome)    Meniscal injury    Tear   Onychomycosis of toenail    Osteopenia    Osteoporosis    PUD (peptic ulcer disease) 1980's   Renal cyst    4.5-6 cm stable single  seen by uro in past   Past Surgical History:  Procedure Laterality Date   CHOLECYSTECTOMY  1987   COLONOSCOPY     IRRIGATION AND DEBRIDEMENT SEBACEOUS CYST Right 3/ 2014   chest wall   KNEE ARTHROSCOPY WITH MENISCAL REPAIR Left 07/30/2022   Procedure: LEFT KNEE ARTHROSCOPY WITH MEDIAL MENISCAL ROOT REPAIR;  Surgeon: Huel Cote, MD;  Location: [facility];  Service: Orthopedics;  Laterality: Left;   UPPER GASTROINTESTINAL ENDOSCOPY     WISDOM TOOTH EXTRACTION  age 69 & 58   lower teeth done first and upper wisdom teeth last   Patient Active Problem List   Diagnosis Date Noted   Acute medial meniscus tear of left knee 07/30/2022   Elevated alkaline phosphatase measurement 09/26/2014   Renal cyst, left 12/31/2013   Osteopenia 12/31/2013   Vitamin D deficiency 12/31/2013    Postmenopausal atrophic vaginitis 12/31/2013   Family history of colon cancer 10/02/2013   Wrist pain, right 05/13/2013   Hx of fall 05/13/2013   Hypercholesteremia 05/13/2013   History of kidney stones    Musculoskeletal leg pain 02/02/2013   Gait abnormality 02/02/2013   Chest discomfort 05/28/2011   Renal cyst 12/01/2010   Cough 09/27/2010   Neck pain 08/29/2010   Allergic rhinitis, seasonal 08/29/2010   VARICOSE VEINS LOWER EXTREMITIES W/OTH COMPS 12/06/2008   BRACHIAL NEURITIS OR RADICULITIS NOS 10/29/2008   HYPERLIPIDEMIA NEC/NOS 01/10/2007   DEPRESSION 01/08/2007   GERD 01/08/2007   Diverticulosis of colon (without mention of hemorrhage) 07/28/2002     REFERRING PROVIDER: Huel Cote, MD   REFERRING DIAG: M23.92 (ICD-10-CM) - Acute internal derangement of left knee     S/p L knee medial meniscal root repair  THERAPY DIAG:  Left knee pain, unspecified chronicity  Muscle weakness (generalized)  Difficulty in walking, not elsewhere classified  Stiffness of left knee, not elsewhere classified  Rationale for Evaluation and Treatment: Rehabilitation  ONSET DATE: DOS 07/30/2022   SUBJECTIVE:   SUBJECTIVE STATEMENT: Pt is 13 weeks and  1 day s/p L knee medial meniscal root repair.  Pt reports compliance with HEP.  Pt denies any adverse effects after prior Rx.  Pt states she has more energy and can do more.  Pt has her volunteer work Advertising account executive.       PERTINENT HISTORY: -Status post left knee medial meniscal root repair 07/30/2022.  -MRI findings of L knee moderate medial compartment deg changes -Pt has a Hx of Baker's cyst.   -Osteoporosis, OA, and lumbar pain  PAIN:  Are you having pain? Yes NPRS:  0/10 current, 2/10 worst Location:  L medial knee  PRECAUTIONS: Other: per surgical protocol and Wb'ing restrictions  WEIGHT BEARING RESTRICTIONS: Yes Advance WB'ing  FALLS:  Has patient fallen in last 6 months? No  LIVING ENVIRONMENT: Lives with: lives  alone Lives in: 1 story home Stairs: ramp to enter home.  Pt has 1 step in home with a rail.  Has following equipment at home:  FWW.  Sister has a cane.  OCCUPATION: Pt is retired  PLOF: Independent.  Pt was able to perform her normal functional mobility skills and daily activities independently without difficulty.  Pt ambulated without an AD.  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    PATIENT GOALS: return to [facility]  Next MD visit: 10/12/2022   OBJECTIVE:   DIAGNOSTIC FINDINGS: Pt is post op.  MRI before surgery also showed mod medial compartment deg changes with mild patellofemoral and lateral compartment deg chondrosis and a tiny Baker's cyst.    TODAY'S TREATMENT:   Reviewed current function, HEP compliance, pain levels, and response to prior Rx.   Therapeutic Exercise: Bike full revolutions x 6 min L3 Lateral band walks with GTB around thighs x 2 laps at rail and with RTB around ankles x 1 lap at rail Cybex leg press 40# x16 reps, 50# x10-12 bilat LAQ 5# 2x10 Seated HS curl with RTB 3x10 (1 set with band around bilat LE's)   Pt received L knee flex/ext PROM in supine  Therapeutic Activities: Step ups 6" x15, 8 inch approx 12 Lateral step ups 2x10 4 inch step Eccentric fwd reach from 4" step 2x10     L knee flexion AROM:  111 deg     PATIENT EDUCATION:  Education details: HEP, protocol progression, exercise form, relevant anatomy, and POC.  Person educated: Patient and sister Education method: Explanation, Demonstration, Tactile cues, Verbal cues Education comprehension: verbalized understanding, returned demonstration, verbal cues required, tactile cues required, and needs further education  HOME EXERCISE PROGRAM: Access Code: [email or web address] URL: [email or web address]   ASSESSMENT:  CLINICAL IMPRESSION: Pt is progressing well with strength, mobility, function, ROM, and tolerance to activity.  She continues to  have limitations and tightness in knee flexion ROM though is making progress.  Pt demonstrates improved L knee flexion AROM as evidenced by goniometric testing.  Pt is progressing with exercises per protocol and is improving with step exercises.  Pt able to perform step ups and eccentric fwd reaches from a higher step today.  She requires cuing to slow down and control movements with exercises.  Pt responded well to Rx having no pain after Rx.  She should benefit from cont skilled PT services per protocol to address ongoing goals and to assist in restoring desired level of function.        OBJECTIVE IMPAIRMENTS: Abnormal gait, decreased activity tolerance, decreased endurance, decreased knowledge of use of DME, decreased mobility, difficulty walking, decreased ROM,  decreased strength, hypomobility, increased edema, impaired flexibility, and pain.   ACTIVITY LIMITATIONS: bending, standing, squatting, stairs, transfers, bed mobility, dressing, and locomotion level  PARTICIPATION LIMITATIONS: meal prep, cleaning, laundry, driving, shopping, community activity, and volunteer work  PERSONAL FACTORS: 3+ comorbidities: moderate deg changes, osteoporosis, and lumbar pain  are also affecting patient's functional outcome.   REHAB POTENTIAL: Good  CLINICAL DECISION MAKING: Stable/uncomplicated  EVALUATION COMPLEXITY: Low   GOALS:  SHORT TERM GOALS: Target date: 08/30/2022  Pt will be independent and compliant with HEP for improved pain, ROM, strength, and function.  Baseline: Goal status: GOAL MET  2.  Pt will progress with Wb'ing per MD orders without adverse effects for improved mobility.  Baseline:  Goal status: GOAL MET  3.  Pt will demo L knee flexion PROM to be 90 deg for improved stiffness and mobility.  Baseline:  Goal status: achieved   4.  Pt will demo a good quad set and perform a supine SLR independently without significant extensor lag for improved strength.  Baseline:  Goal  status:achieved  5.  Pt will be able to perform a 6 inch step up with good form and control.  Baseline:  Goal status: GOAL MET Target date:  10/11/2022  6.  Pt will ambulate with a normalized heel to toe gait pattern without AD.  Baseline:  Goal status:  PARTIALLY MET Target date:  10/25/2022  7.  Pt will demo L knee AROM to 0 - 115 deg for improved mobility and stiffness.  Baseline:  Goal status: PROGRESSING Target date:  10/25/2022   LONG TERM GOALS: Target date: 11/26/2022   Pt will ambulate extended community distance without difficulty and significant pain.  Baseline:  Goal status: PROGRESSING  2.  Pt will be able to perform stairs with a reciprocal gait with the rail.   Baseline:  Goal status:  PARTIALLY MET  3.  Pt will be able to perform her ADLs and IADLs without significant pain and difficulty.  Baseline:  Goal status: PROGRESSING  4.  Pt will be able to perform her volunteer work without adverse effects.  Baseline:  Goal status: ONGOING  5.  Pt will demo 5/5 strength in L hip flex and abd and at least 4+/5 strength in L knee flex and extension for improved performance of and tolerance with functional mobility.  Baseline:  Goal status: PROGRESSING     PLAN:  PT FREQUENCY:  2x/wk  PT DURATION: other: 5 weeks  PLANNED INTERVENTIONS: Therapeutic exercises, Therapeutic activity, Neuromuscular re-education, Balance training, Gait training, Patient/Family education, Self Care, Joint mobilization, Stair training, Aquatic Therapy, Dry Needling, Electrical stimulation, Cryotherapy, Moist heat, scar mobilization, Taping, Ultrasound, Manual therapy, and Re-evaluation  PLAN FOR NEXT SESSION:  Cont per Dr. Serena Croissant meniscus repair protocol.  Work on improving flexion ROM.  Cont with strengthening per protocol.      Audie Clear III PT, DPT 10/30/22 5:03 PM

## 2022-11-01 ENCOUNTER — Encounter (HOSPITAL_BASED_OUTPATIENT_CLINIC_OR_DEPARTMENT_OTHER): Payer: Self-pay

## 2022-11-01 ENCOUNTER — Ambulatory Visit (HOSPITAL_BASED_OUTPATIENT_CLINIC_OR_DEPARTMENT_OTHER): Payer: Medicare PPO

## 2022-11-01 DIAGNOSIS — M25662 Stiffness of left knee, not elsewhere classified: Secondary | ICD-10-CM

## 2022-11-01 DIAGNOSIS — M6281 Muscle weakness (generalized): Secondary | ICD-10-CM | POA: Diagnosis not present

## 2022-11-01 DIAGNOSIS — M25562 Pain in left knee: Secondary | ICD-10-CM | POA: Diagnosis not present

## 2022-11-01 DIAGNOSIS — R262 Difficulty in walking, not elsewhere classified: Secondary | ICD-10-CM | POA: Diagnosis not present

## 2022-11-01 NOTE — Therapy (Signed)
OUTPATIENT PHYSICAL THERAPY TREATMENT NOTE        Patient Name: [name] [name] MRN: [id number] DOB:[birth year], 67 y.o., female Today's Date: 11/01/2022  END OF SESSION:  PT End of Session - 11/01/22 1539     Visit Number 24    Number of Visits 31    Date for PT Re-Evaluation 11/26/22    Authorization Type [employer] MCR    PT Start Time 1432    PT Stop Time 1515    PT Time Calculation (min) 43 min    Activity Tolerance Patient tolerated treatment well    Behavior During Therapy WFL for tasks assessed/performed                            Past Medical History:  Diagnosis Date   Arthritis    Bunion    left foot   Depression    stable on medication   Diverticulitis 2015   Diverticulitis of colon without hemorrhage 09/28/2013   Diverticulosis    Gallstones    GERD (gastroesophageal reflux disease)    Hammer toes of both feet    Hyperlipidemia    no meds needed   IBS (irritable bowel syndrome)    Meniscal injury    Tear   Onychomycosis of toenail    Osteopenia    Osteoporosis    PUD (peptic ulcer disease) 1980's   Renal cyst    4.5-6 cm stable single  seen by uro in past   Past Surgical History:  Procedure Laterality Date   CHOLECYSTECTOMY  1987   COLONOSCOPY     IRRIGATION AND DEBRIDEMENT SEBACEOUS CYST Right 3/ 2014   chest wall   KNEE ARTHROSCOPY WITH MENISCAL REPAIR Left 07/30/2022   Procedure: LEFT KNEE ARTHROSCOPY WITH MEDIAL MENISCAL ROOT REPAIR;  Surgeon: Huel Cote, MD;  Location:  [facility];  Service: Orthopedics;  Laterality: Left;   UPPER GASTROINTESTINAL ENDOSCOPY     WISDOM TOOTH EXTRACTION  age 63 & 37   lower teeth done first and upper wisdom teeth last   Patient Active Problem List   Diagnosis Date Noted   Acute medial meniscus tear of left knee 07/30/2022   Elevated alkaline phosphatase measurement 09/26/2014   Renal cyst, left 12/31/2013   Osteopenia 12/31/2013   Vitamin D deficiency 12/31/2013    Postmenopausal atrophic vaginitis 12/31/2013   Family history of colon cancer 10/02/2013   Wrist pain, right 05/13/2013   Hx of fall 05/13/2013   Hypercholesteremia 05/13/2013   History of kidney stones    Musculoskeletal leg pain 02/02/2013   Gait abnormality 02/02/2013   Chest discomfort 05/28/2011   Renal cyst 12/01/2010   Cough 09/27/2010   Neck pain 08/29/2010   Allergic rhinitis, seasonal 08/29/2010   VARICOSE VEINS LOWER EXTREMITIES W/OTH COMPS 12/06/2008   BRACHIAL NEURITIS OR RADICULITIS NOS 10/29/2008   HYPERLIPIDEMIA NEC/NOS 01/10/2007   DEPRESSION 01/08/2007   GERD 01/08/2007   Diverticulosis of colon (without mention of hemorrhage) 07/28/2002     REFERRING PROVIDER: Huel Cote, MD   REFERRING DIAG: M23.92 (ICD-10-CM) - Acute internal derangement of left knee     S/p L knee medial meniscal root repair  THERAPY DIAG:  Left knee pain, unspecified chronicity  Muscle weakness (generalized)  Stiffness of left knee, not elsewhere classified  Difficulty in walking, not elsewhere classified  Rationale for Evaluation and Treatment: Rehabilitation  ONSET DATE: DOS 07/30/2022   SUBJECTIVE:   SUBJECTIVE STATEMENT: Pt denies pain at  entry. Volunteered at [employer] recently which involved a lot of walking and made her knee a little tired.   PERTINENT HISTORY: -Status post left knee medial meniscal root repair 07/30/2022.  -MRI findings of L knee moderate medial compartment deg changes -Pt has a Hx of Baker's cyst.   -Osteoporosis, OA, and lumbar pain  PAIN:  Are you having pain? Yes NPRS:  0/10 current, 2/10 worst Location:  L medial knee  PRECAUTIONS: Other: per surgical protocol and Wb'ing restrictions  WEIGHT BEARING RESTRICTIONS: Yes Advance WB'ing  FALLS:  Has patient fallen in last 6 months? No  LIVING ENVIRONMENT: Lives with: lives alone Lives in: 1 story home Stairs: ramp to enter home.  Pt has 1 step in home with a rail.  Has following  equipment at home:  FWW.  Sister has a cane.  OCCUPATION: Pt is retired  PLOF: Independent.  Pt was able to perform her normal functional mobility skills and daily activities independently without difficulty.  Pt ambulated without an AD.  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    PATIENT GOALS: return to [facility]  Next MD visit: 10/12/2022   OBJECTIVE:   DIAGNOSTIC FINDINGS: Pt is post op.  MRI before surgery also showed mod medial compartment deg changes with mild patellofemoral and lateral compartment deg chondrosis and a tiny Baker's cyst.    TODAY'S TREATMENT:   Reviewed current function, HEP compliance, pain levels, and response to prior Rx.   Therapeutic Exercise: Bike full revolutions x 6 min L3 Cybex leg press 55# 3x10 (more wgt through L LE) LAQ 5# 3x10 Seated HS curl with RTB 3x10  Pt received L knee flex/ext PROM in supine  Therapeutic Activities: Step ups 8" 2x10 with opposite march Stair climbing - single flight reciprocal- minimal rail use on ascent, single rail use on descent Lateral step ups 2x10 4 inch step Eccentric fwd reach from 4" step 2x10 , from 6" step x10 Staggered sit to stands 2x10 at plinth   PATIENT EDUCATION:  Education details: HEP, protocol progression, exercise form, relevant anatomy, and POC.  Person educated: Patient and sister Education method: Explanation, Demonstration, Tactile cues, Verbal cues Education comprehension: verbalized understanding, returned demonstration, verbal cues required, tactile cues required, and needs further education  HOME EXERCISE PROGRAM: Access Code: [email or web address] URL: [email or web address]   ASSESSMENT:  CLINICAL IMPRESSION: Pt able to trial eccentric step downs from 6" step today and was able to complete 10 repetitions, though some difficulty present. With stair climbing she was able to use the railing minimally unilaterally during ascent of entire flight.  Single rail was used with descent, though she demonstrated reciprocal pattern. Pt is most challenged with eccentric quad control as noted with step downs, leg press, and staggered sit to stands. Pt also remains stiff into end range knee flexion. Pt instructed to continue ice/elevation when able due to continued swelling. Will continue to progress as tolerated.        OBJECTIVE IMPAIRMENTS: Abnormal gait, decreased activity tolerance, decreased endurance, decreased knowledge of use of DME, decreased mobility, difficulty walking, decreased ROM, decreased strength, hypomobility, increased edema, impaired flexibility, and pain.   ACTIVITY LIMITATIONS: bending, standing, squatting, stairs, transfers, bed mobility, dressing, and locomotion level  PARTICIPATION LIMITATIONS: meal prep, cleaning, laundry, driving, shopping, community activity, and volunteer work  PERSONAL FACTORS: 3+ comorbidities: moderate deg changes, osteoporosis, and lumbar pain  are also affecting patient's functional outcome.   REHAB POTENTIAL: Good  CLINICAL DECISION  MAKING: Stable/uncomplicated  EVALUATION COMPLEXITY: Low   GOALS:  SHORT TERM GOALS: Target date: 08/30/2022  Pt will be independent and compliant with HEP for improved pain, ROM, strength, and function.  Baseline: Goal status: GOAL MET  2.  Pt will progress with Wb'ing per MD orders without adverse effects for improved mobility.  Baseline:  Goal status: GOAL MET  3.  Pt will demo L knee flexion PROM to be 90 deg for improved stiffness and mobility.  Baseline:  Goal status: achieved   4.  Pt will demo a good quad set and perform a supine SLR independently without significant extensor lag for improved strength.  Baseline:  Goal status:achieved  5.  Pt will be able to perform a 6 inch step up with good form and control.  Baseline:  Goal status: GOAL MET Target date:  10/11/2022  6.  Pt will ambulate with a normalized heel to toe gait pattern without  AD.  Baseline:  Goal status:  PARTIALLY MET Target date:  10/25/2022  7.  Pt will demo L knee AROM to 0 - 115 deg for improved mobility and stiffness.  Baseline:  Goal status: PROGRESSING Target date:  10/25/2022   LONG TERM GOALS: Target date: 11/26/2022   Pt will ambulate extended community distance without difficulty and significant pain.  Baseline:  Goal status: PROGRESSING  2.  Pt will be able to perform stairs with a reciprocal gait with the rail.   Baseline:  Goal status:  PARTIALLY MET  3.  Pt will be able to perform her ADLs and IADLs without significant pain and difficulty.  Baseline:  Goal status: PROGRESSING  4.  Pt will be able to perform her volunteer work without adverse effects.  Baseline:  Goal status: ONGOING  5.  Pt will demo 5/5 strength in L hip flex and abd and at least 4+/5 strength in L knee flex and extension for improved performance of and tolerance with functional mobility.  Baseline:  Goal status: PROGRESSING     PLAN:  PT FREQUENCY:  2x/wk  PT DURATION: other: 5 weeks  PLANNED INTERVENTIONS: Therapeutic exercises, Therapeutic activity, Neuromuscular re-education, Balance training, Gait training, Patient/Family education, Self Care, Joint mobilization, Stair training, Aquatic Therapy, Dry Needling, Electrical stimulation, Cryotherapy, Moist heat, scar mobilization, Taping, Ultrasound, Manual therapy, and Re-evaluation  PLAN FOR NEXT SESSION:  Cont per Dr. Serena Croissant meniscus repair protocol.  Work on improving flexion ROM.  Cont with strengthening per protocol.     Riki Altes, PTA  11/01/22 4:13 PM

## 2022-11-02 DIAGNOSIS — N281 Cyst of kidney, acquired: Secondary | ICD-10-CM | POA: Diagnosis not present

## 2022-11-05 ENCOUNTER — Ambulatory Visit (HOSPITAL_BASED_OUTPATIENT_CLINIC_OR_DEPARTMENT_OTHER): Payer: Medicare PPO

## 2022-11-05 ENCOUNTER — Encounter (HOSPITAL_BASED_OUTPATIENT_CLINIC_OR_DEPARTMENT_OTHER): Payer: Self-pay

## 2022-11-05 DIAGNOSIS — M25662 Stiffness of left knee, not elsewhere classified: Secondary | ICD-10-CM | POA: Diagnosis not present

## 2022-11-05 DIAGNOSIS — M25562 Pain in left knee: Secondary | ICD-10-CM | POA: Diagnosis not present

## 2022-11-05 DIAGNOSIS — M6281 Muscle weakness (generalized): Secondary | ICD-10-CM | POA: Diagnosis not present

## 2022-11-05 DIAGNOSIS — R262 Difficulty in walking, not elsewhere classified: Secondary | ICD-10-CM

## 2022-11-05 NOTE — Therapy (Signed)
OUTPATIENT PHYSICAL THERAPY TREATMENT NOTE        Patient Name: [name] [name] MRN: [id number] DOB:1955/11/15, 67 y.o., female Today's Date: 11/05/2022  END OF SESSION:  PT End of Session - 11/05/22 1436     Visit Number 25    Number of Visits 31    Date for PT Re-Evaluation 11/26/22    Authorization Type [employer] MCR    PT Start Time 1434    PT Stop Time 1515    PT Time Calculation (min) 41 min    Activity Tolerance Patient tolerated treatment well    Behavior During Therapy WFL for tasks assessed/performed                            Past Medical History:  Diagnosis Date   Arthritis    Bunion    left foot   Depression    stable on medication   Diverticulitis 2015   Diverticulitis of colon without hemorrhage 09/28/2013   Diverticulosis    Gallstones    GERD (gastroesophageal reflux disease)    Hammer toes of both feet    Hyperlipidemia    no meds needed   IBS (irritable bowel syndrome)    Meniscal injury    Tear   Onychomycosis of toenail    Osteopenia    Osteoporosis    PUD (peptic ulcer disease) 1980's   Renal cyst    4.5-6 cm stable single  seen by uro in past   Past Surgical History:  Procedure Laterality Date   CHOLECYSTECTOMY  1987   COLONOSCOPY     IRRIGATION AND DEBRIDEMENT SEBACEOUS CYST Right 3/ 2014   chest wall   KNEE ARTHROSCOPY WITH MENISCAL REPAIR Left 07/30/2022   Procedure: LEFT KNEE ARTHROSCOPY WITH MEDIAL MENISCAL ROOT REPAIR;  Surgeon: Huel Cote, MD;  Location: [facility];  Service: Orthopedics;  Laterality: Left;   UPPER GASTROINTESTINAL ENDOSCOPY     WISDOM TOOTH EXTRACTION  age 54 & 2   lower teeth done first and upper wisdom teeth last   Patient Active Problem List   Diagnosis Date Noted   Acute medial meniscus tear of left knee 07/30/2022   Elevated alkaline phosphatase measurement 09/26/2014   Renal cyst, left 12/31/2013   Osteopenia 12/31/2013   Vitamin D deficiency 12/31/2013    Postmenopausal atrophic vaginitis 12/31/2013   Family history of colon cancer 10/02/2013   Wrist pain, right 05/13/2013   Hx of fall 05/13/2013   Hypercholesteremia 05/13/2013   History of kidney stones    Musculoskeletal leg pain 02/02/2013   Gait abnormality 02/02/2013   Chest discomfort 05/28/2011   Renal cyst 12/01/2010   Cough 09/27/2010   Neck pain 08/29/2010   Allergic rhinitis, seasonal 08/29/2010   VARICOSE VEINS LOWER EXTREMITIES W/OTH COMPS 12/06/2008   BRACHIAL NEURITIS OR RADICULITIS NOS 10/29/2008   HYPERLIPIDEMIA NEC/NOS 01/10/2007   DEPRESSION 01/08/2007   GERD 01/08/2007   Diverticulosis of colon (without mention of hemorrhage) 07/28/2002     REFERRING PROVIDER: Huel Cote, MD   REFERRING DIAG: M23.92 (ICD-10-CM) - Acute internal derangement of left knee     S/p L knee medial meniscal root repair  THERAPY DIAG:  Left knee pain, unspecified chronicity  Muscle weakness (generalized)  Stiffness of left knee, not elsewhere classified  Difficulty in walking, not elsewhere classified  Rationale for Evaluation and Treatment: Rehabilitation  ONSET DATE: DOS 07/30/2022   SUBJECTIVE:   SUBJECTIVE STATEMENT: Pt continues to deny  pain in L knee.    PERTINENT HISTORY: -Status post left knee medial meniscal root repair 07/30/2022.  -MRI findings of L knee moderate medial compartment deg changes -Pt has a Hx of Baker's cyst.   -Osteoporosis, OA, and lumbar pain  PAIN:  Are you having pain? Yes NPRS:  0/10 current, 2/10 worst Location:  L medial knee  PRECAUTIONS: Other: per surgical protocol and Wb'ing restrictions  WEIGHT BEARING RESTRICTIONS: Yes Advance WB'ing  FALLS:  Has patient fallen in last 6 months? No  LIVING ENVIRONMENT: Lives with: lives alone Lives in: 1 story home Stairs: ramp to enter home.  Pt has 1 step in home with a rail.  Has following equipment at home:  FWW.  Sister has a cane.  OCCUPATION: Pt is retired  PLOF:  Independent.  Pt was able to perform her normal functional mobility skills and daily activities independently without difficulty.  Pt ambulated without an AD.  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    PATIENT GOALS: return to [facility]  Next MD visit: 10/12/2022   OBJECTIVE:   DIAGNOSTIC FINDINGS: Pt is post op.  MRI before surgery also showed mod medial compartment deg changes with mild patellofemoral and lateral compartment deg chondrosis and a tiny Baker's cyst.    TODAY'S TREATMENT:   Reviewed current function, HEP compliance, pain levels, and response to prior Rx.   Therapeutic Exercise: Bike full revolutions x 6 min L3 White leg press 70# 3x10 (more wgt through L LE) Single HR 2x10 L Leg extension machine 15lbs 2x15 Pt received L knee flex/ext PROM in supine  Therapeutic Activities: Step ups 8" 2x10 with opposite march Stair climbing - single flight reciprocal- minimal rail use on ascent, single rail use on descent Eccentric fwd reach from 4" step 2x10 , from 6" step x10 Staggered sit to stands 2x10 at plinth   PATIENT EDUCATION:  Education details: HEP, protocol progression, exercise form, relevant anatomy, and POC.  Person educated: Patient and sister Education method: Explanation, Demonstration, Tactile cues, Verbal cues Education comprehension: verbalized understanding, returned demonstration, verbal cues required, tactile cues required, and needs further education  HOME EXERCISE PROGRAM: Access Code: [email or web address] URL: [email or web address]   ASSESSMENT:  CLINICAL IMPRESSION: Pt remains challenged by eccentric step downs from 6" step. She is able to complete these at 4" step with mild difficulty.  She is able to complete stair climbing reciprocally both ascending and descending.  Pt is improving in strength with PT interventions as noted by progressions. She was able to utilize leg extension machine today without  complaint.  Patient's main limitation is endrange knee flexion.  Patient continues to demonstrate stiffness..  Educated patient on how to improve this at home with heel slide exercises.  Will hopefully gain least 5 more degrees prior to DC.  Will plan to potentially DC in 2 visits.       OBJECTIVE IMPAIRMENTS: Abnormal gait, decreased activity tolerance, decreased endurance, decreased knowledge of use of DME, decreased mobility, difficulty walking, decreased ROM, decreased strength, hypomobility, increased edema, impaired flexibility, and pain.   ACTIVITY LIMITATIONS: bending, standing, squatting, stairs, transfers, bed mobility, dressing, and locomotion level  PARTICIPATION LIMITATIONS: meal prep, cleaning, laundry, driving, shopping, community activity, and volunteer work  PERSONAL FACTORS: 3+ comorbidities: moderate deg changes, osteoporosis, and lumbar pain  are also affecting patient's functional outcome.   REHAB POTENTIAL: Good  CLINICAL DECISION MAKING: Stable/uncomplicated  EVALUATION COMPLEXITY: Low   GOALS:  SHORT TERM  GOALS: Target date: 08/30/2022  Pt will be independent and compliant with HEP for improved pain, ROM, strength, and function.  Baseline: Goal status: GOAL MET  2.  Pt will progress with Wb'ing per MD orders without adverse effects for improved mobility.  Baseline:  Goal status: GOAL MET  3.  Pt will demo L knee flexion PROM to be 90 deg for improved stiffness and mobility.  Baseline:  Goal status: achieved   4.  Pt will demo a good quad set and perform a supine SLR independently without significant extensor lag for improved strength.  Baseline:  Goal status:achieved  5.  Pt will be able to perform a 6 inch step up with good form and control.  Baseline:  Goal status: GOAL MET Target date:  10/11/2022  6.  Pt will ambulate with a normalized heel to toe gait pattern without AD.  Baseline:  Goal status:  PARTIALLY MET Target date:  10/25/2022  7.  Pt  will demo L knee AROM to 0 - 115 deg for improved mobility and stiffness.  Baseline:  Goal status: PROGRESSING Target date:  10/25/2022   LONG TERM GOALS: Target date: 11/26/2022   Pt will ambulate extended community distance without difficulty and significant pain.  Baseline:  Goal status: MET 6/17  2.  Pt will be able to perform stairs with a reciprocal gait with the rail.   Baseline:  Goal status:  MET 6/17  3.  Pt will be able to perform her ADLs and IADLs without significant pain and difficulty.  Baseline:  Goal status:MET 6/17  4.  Pt will be able to perform her volunteer work without adverse effects.  Baseline:  Goal status: MET 6/17  5.  Pt will demo 5/5 strength in L hip flex and abd and at least 4+/5 strength in L knee flex and extension for improved performance of and tolerance with functional mobility.  Baseline:  Goal status: PROGRESSING     PLAN:  PT FREQUENCY:  2x/wk  PT DURATION: other: 5 weeks  PLANNED INTERVENTIONS: Therapeutic exercises, Therapeutic activity, Neuromuscular re-education, Balance training, Gait training, Patient/Family education, Self Care, Joint mobilization, Stair training, Aquatic Therapy, Dry Needling, Electrical stimulation, Cryotherapy, Moist heat, scar mobilization, Taping, Ultrasound, Manual therapy, and Re-evaluation  PLAN FOR NEXT SESSION:  Cont per Dr. Serena Croissant meniscus repair protocol.  Work on improving flexion ROM.  Cont with strengthening per protocol.     Riki Altes, PTA  11/05/22 5:13 PM

## 2022-11-06 DIAGNOSIS — F331 Major depressive disorder, recurrent, moderate: Secondary | ICD-10-CM | POA: Diagnosis not present

## 2022-11-06 DIAGNOSIS — F411 Generalized anxiety disorder: Secondary | ICD-10-CM | POA: Diagnosis not present

## 2022-11-08 ENCOUNTER — Encounter (HOSPITAL_BASED_OUTPATIENT_CLINIC_OR_DEPARTMENT_OTHER): Payer: Self-pay

## 2022-11-08 ENCOUNTER — Ambulatory Visit (HOSPITAL_BASED_OUTPATIENT_CLINIC_OR_DEPARTMENT_OTHER): Payer: Medicare PPO

## 2022-11-08 DIAGNOSIS — M25562 Pain in left knee: Secondary | ICD-10-CM

## 2022-11-08 DIAGNOSIS — R262 Difficulty in walking, not elsewhere classified: Secondary | ICD-10-CM

## 2022-11-08 DIAGNOSIS — M6281 Muscle weakness (generalized): Secondary | ICD-10-CM

## 2022-11-08 DIAGNOSIS — M25662 Stiffness of left knee, not elsewhere classified: Secondary | ICD-10-CM

## 2022-11-08 NOTE — Therapy (Signed)
OUTPATIENT PHYSICAL THERAPY TREATMENT NOTE        Patient Name: [name] [name] MRN: [id number] DOB:1956/02/16, 67 y.o., female Today's Date: 11/08/2022  END OF SESSION:  PT End of Session - 11/08/22 1522     Visit Number 26    Number of Visits 31    Date for PT Re-Evaluation 11/26/22    Authorization Type [employer] MCR    PT Start Time 1517    PT Stop Time 1600    PT Time Calculation (min) 43 min    Activity Tolerance Patient tolerated treatment well    Behavior During Therapy WFL for tasks assessed/performed                            Past Medical History:  Diagnosis Date   Arthritis    Bunion    left foot   Depression    stable on medication   Diverticulitis 2015   Diverticulitis of colon without hemorrhage 09/28/2013   Diverticulosis    Gallstones    GERD (gastroesophageal reflux disease)    Hammer toes of both feet    Hyperlipidemia    no meds needed   IBS (irritable bowel syndrome)    Meniscal injury    Tear   Onychomycosis of toenail    Osteopenia    Osteoporosis    PUD (peptic ulcer disease) 1980's   Renal cyst    4.5-6 cm stable single  seen by uro in past   Past Surgical History:  Procedure Laterality Date   CHOLECYSTECTOMY  1987   COLONOSCOPY     IRRIGATION AND DEBRIDEMENT SEBACEOUS CYST Right 3/ 2014   chest wall   KNEE ARTHROSCOPY WITH MENISCAL REPAIR Left 07/30/2022   Procedure: LEFT KNEE ARTHROSCOPY WITH MEDIAL MENISCAL ROOT REPAIR;  Surgeon: Huel Cote, MD;  Location: [facility];  Service: Orthopedics;  Laterality: Left;   UPPER GASTROINTESTINAL ENDOSCOPY     WISDOM TOOTH EXTRACTION  age 85 & 89   lower teeth done first and upper wisdom teeth last   Patient Active Problem List   Diagnosis Date Noted   Acute medial meniscus tear of left knee 07/30/2022   Elevated alkaline phosphatase measurement 09/26/2014   Renal cyst, left 12/31/2013   Osteopenia 12/31/2013   Vitamin D deficiency 12/31/2013    Postmenopausal atrophic vaginitis 12/31/2013   Family history of colon cancer 10/02/2013   Wrist pain, right 05/13/2013   Hx of fall 05/13/2013   Hypercholesteremia 05/13/2013   History of kidney stones    Musculoskeletal leg pain 02/02/2013   Gait abnormality 02/02/2013   Chest discomfort 05/28/2011   Renal cyst 12/01/2010   Cough 09/27/2010   Neck pain 08/29/2010   Allergic rhinitis, seasonal 08/29/2010   VARICOSE VEINS LOWER EXTREMITIES W/OTH COMPS 12/06/2008   BRACHIAL NEURITIS OR RADICULITIS NOS 10/29/2008   HYPERLIPIDEMIA NEC/NOS 01/10/2007   DEPRESSION 01/08/2007   GERD 01/08/2007   Diverticulosis of colon (without mention of hemorrhage) 07/28/2002     REFERRING PROVIDER: Huel Cote, MD   REFERRING DIAG: M23.92 (ICD-10-CM) - Acute internal derangement of left knee     S/p L knee medial meniscal root repair  THERAPY DIAG:  Left knee pain, unspecified chronicity  Muscle weakness (generalized)  Difficulty in walking, not elsewhere classified  Stiffness of left knee, not elsewhere classified  Rationale for Evaluation and Treatment: Rehabilitation  ONSET DATE: DOS 07/30/2022   SUBJECTIVE:   SUBJECTIVE STATEMENT: Pt reports no pain  at entry.    PERTINENT HISTORY: -Status post left knee medial meniscal root repair 07/30/2022.  -MRI findings of L knee moderate medial compartment deg changes -Pt has a Hx of Baker's cyst.   -Osteoporosis, OA, and lumbar pain  PAIN:  Are you having pain? Yes NPRS:  0/10 current, 2/10 worst Location:  L medial knee  PRECAUTIONS: Other: per surgical protocol and Wb'ing restrictions  WEIGHT BEARING RESTRICTIONS: Yes Advance WB'ing  FALLS:  Has patient fallen in last 6 months? No  LIVING ENVIRONMENT: Lives with: lives alone Lives in: 1 story home Stairs: ramp to enter home.  Pt has 1 step in home with a rail.  Has following equipment at home:  FWW.  Sister has a cane.  OCCUPATION: Pt is retired  PLOF: Independent.   Pt was able to perform her normal functional mobility skills and daily activities independently without difficulty.  Pt ambulated without an AD.  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    PATIENT GOALS: return to [facility]  Next MD visit: 10/12/2022   OBJECTIVE:   DIAGNOSTIC FINDINGS: Pt is post op.  MRI before surgery also showed mod medial compartment deg changes with mild patellofemoral and lateral compartment deg chondrosis and a tiny Baker's cyst.    TODAY'S TREATMENT:   Reviewed current function, HEP compliance, pain levels, and response to prior Rx.   Therapeutic Exercise: Bike full revolutions x 5 min L3 White leg press 70# 3x10 (more wgt through L LE) Single HR 2x10 L Leg extension machine 15lbs 2x15 Pt received L knee flex/ext PROM in supine Tandem walking against wall 1/2 hall  Therapeutic Activities: Step ups 8" 2x10 with opposite march Stair climbing - 2 flights reciprocal- minimal rail use on ascent, single rail use on descent Eccentric fwd reach from 4" step 2x10  Staggered sit to stands 2x10 at plinth Modified SLDL x10 (to chair)   PATIENT EDUCATION:  Education details: HEP, protocol progression, exercise form, relevant anatomy, and POC.  Person educated: Patient and sister Education method: Explanation, Demonstration, Tactile cues, Verbal cues Education comprehension: verbalized understanding, returned demonstration, verbal cues required, tactile cues required, and needs further education  HOME EXERCISE PROGRAM: Access Code: [email or web address] URL: [email or web address]   ASSESSMENT:  CLINICAL IMPRESSION: Spent time on PROM with manual overpressure for knee flexion as pt continues to demonstrate stiffness at end range. Again reviewed heel slide stretch for pt to continue with at home. Pt was able to complete 2 flights of stair climbing in clinic today with reciprocal pattern. She does have minor unsteadiness with  step ups at 8" step, so used SBA. Added tandem walking and modified SLDL to work on improving balance. Will plan to potentially DC next visit.       OBJECTIVE IMPAIRMENTS: Abnormal gait, decreased activity tolerance, decreased endurance, decreased knowledge of use of DME, decreased mobility, difficulty walking, decreased ROM, decreased strength, hypomobility, increased edema, impaired flexibility, and pain.   ACTIVITY LIMITATIONS: bending, standing, squatting, stairs, transfers, bed mobility, dressing, and locomotion level  PARTICIPATION LIMITATIONS: meal prep, cleaning, laundry, driving, shopping, community activity, and volunteer work  PERSONAL FACTORS: 3+ comorbidities: moderate deg changes, osteoporosis, and lumbar pain  are also affecting patient's functional outcome.   REHAB POTENTIAL: Good  CLINICAL DECISION MAKING: Stable/uncomplicated  EVALUATION COMPLEXITY: Low   GOALS:  SHORT TERM GOALS: Target date: 08/30/2022  Pt will be independent and compliant with HEP for improved pain, ROM, strength, and function.  Baseline: Goal  status: GOAL MET  2.  Pt will progress with Wb'ing per MD orders without adverse effects for improved mobility.  Baseline:  Goal status: GOAL MET  3.  Pt will demo L knee flexion PROM to be 90 deg for improved stiffness and mobility.  Baseline:  Goal status: achieved   4.  Pt will demo a good quad set and perform a supine SLR independently without significant extensor lag for improved strength.  Baseline:  Goal status:achieved  5.  Pt will be able to perform a 6 inch step up with good form and control.  Baseline:  Goal status: GOAL MET Target date:  10/11/2022  6.  Pt will ambulate with a normalized heel to toe gait pattern without AD.  Baseline:  Goal status:  PARTIALLY MET Target date:  10/25/2022  7.  Pt will demo L knee AROM to 0 - 115 deg for improved mobility and stiffness.  Baseline:  Goal status: PROGRESSING Target date:   10/25/2022   LONG TERM GOALS: Target date: 11/26/2022   Pt will ambulate extended community distance without difficulty and significant pain.  Baseline:  Goal status: MET 6/17  2.  Pt will be able to perform stairs with a reciprocal gait with the rail.   Baseline:  Goal status:  MET 6/17  3.  Pt will be able to perform her ADLs and IADLs without significant pain and difficulty.  Baseline:  Goal status:MET 6/17  4.  Pt will be able to perform her volunteer work without adverse effects.  Baseline:  Goal status: MET 6/17  5.  Pt will demo 5/5 strength in L hip flex and abd and at least 4+/5 strength in L knee flex and extension for improved performance of and tolerance with functional mobility.  Baseline:  Goal status: PROGRESSING     PLAN:  PT FREQUENCY:  2x/wk  PT DURATION: other: 5 weeks  PLANNED INTERVENTIONS: Therapeutic exercises, Therapeutic activity, Neuromuscular re-education, Balance training, Gait training, Patient/Family education, Self Care, Joint mobilization, Stair training, Aquatic Therapy, Dry Needling, Electrical stimulation, Cryotherapy, Moist heat, scar mobilization, Taping, Ultrasound, Manual therapy, and Re-evaluation  PLAN FOR NEXT SESSION:  Cont per Dr. Serena Croissant meniscus repair protocol.  Work on improving flexion ROM.  Cont with strengthening per protocol.     Riki Altes, PTA  11/08/22 4:34 PM

## 2022-11-13 ENCOUNTER — Ambulatory Visit (HOSPITAL_BASED_OUTPATIENT_CLINIC_OR_DEPARTMENT_OTHER): Payer: Medicare PPO | Admitting: Physical Therapy

## 2022-11-13 DIAGNOSIS — M25562 Pain in left knee: Secondary | ICD-10-CM

## 2022-11-13 DIAGNOSIS — M25662 Stiffness of left knee, not elsewhere classified: Secondary | ICD-10-CM

## 2022-11-13 DIAGNOSIS — M6281 Muscle weakness (generalized): Secondary | ICD-10-CM | POA: Diagnosis not present

## 2022-11-13 DIAGNOSIS — R262 Difficulty in walking, not elsewhere classified: Secondary | ICD-10-CM | POA: Diagnosis not present

## 2022-11-13 NOTE — Therapy (Signed)
OUTPATIENT PHYSICAL THERAPY TREATMENT NOTE        Patient Name: [name] [name] MRN: [id number] DOB:05-02-1956, 67 y.o., female Today's Date: 11/14/2022  END OF SESSION:    PT End of Session - 11/13/22        Visit Number 27    Number of Visits 28     Date for PT Re-Evaluation 11/26/22     Authorization Type [employer] MCR     PT Start Time 1408     PT Stop Time 1449     PT Time Calculation (min) 41 min     Activity Tolerance Patient tolerated treatment well     Behavior During Therapy WFL for tasks assessed/performed                  Past Medical History:  Diagnosis Date   Arthritis    Bunion    left foot   Depression    stable on medication   Diverticulitis 2015   Diverticulitis of colon without hemorrhage 09/28/2013   Diverticulosis    Gallstones    GERD (gastroesophageal reflux disease)    Hammer toes of both feet    Hyperlipidemia    no meds needed   IBS (irritable bowel syndrome)    Meniscal injury    Tear   Onychomycosis of toenail    Osteopenia    Osteoporosis    PUD (peptic ulcer disease) 1980's   Renal cyst    4.5-6 cm stable single  seen by uro in past   Past Surgical History:  Procedure Laterality Date   CHOLECYSTECTOMY  1987   COLONOSCOPY     IRRIGATION AND DEBRIDEMENT SEBACEOUS CYST Right 3/ 2014   chest wall   KNEE ARTHROSCOPY WITH MENISCAL REPAIR Left 07/30/2022   Procedure: LEFT KNEE ARTHROSCOPY WITH MEDIAL MENISCAL ROOT REPAIR;  Surgeon: Huel Cote, MD;  [facility]  [facility];  Service: Orthopedics;  Laterality: Left;   UPPER GASTROINTESTINAL ENDOSCOPY     WISDOM TOOTH EXTRACTION  age 3 & 57   lower teeth done first and upper wisdom teeth last   Patient Active Problem List   Diagnosis Date Noted   Acute medial meniscus tear of left knee 07/30/2022   Elevated alkaline phosphatase measurement 09/26/2014   Renal cyst, left 12/31/2013   Osteopenia 12/31/2013   Vitamin D deficiency 12/31/2013    Postmenopausal atrophic vaginitis 12/31/2013   Family history of colon cancer 10/02/2013   Wrist pain, right 05/13/2013   Hx of fall 05/13/2013   Hypercholesteremia 05/13/2013   History of kidney stones    Musculoskeletal leg pain 02/02/2013   Gait abnormality 02/02/2013   Chest discomfort 05/28/2011   Renal cyst 12/01/2010   Cough 09/27/2010   Neck pain 08/29/2010   Allergic rhinitis, seasonal 08/29/2010   VARICOSE VEINS LOWER EXTREMITIES W/OTH COMPS 12/06/2008   BRACHIAL NEURITIS OR RADICULITIS NOS 10/29/2008   HYPERLIPIDEMIA NEC/NOS 01/10/2007   DEPRESSION 01/08/2007   GERD 01/08/2007   Diverticulosis of colon (without mention of hemorrhage) 07/28/2002     REFERRING PROVIDER: Huel Cote, MD   REFERRING DIAG: M23.92 (ICD-10-CM) - Acute internal derangement of left knee     S/p L knee medial meniscal root repair  THERAPY DIAG:  Left knee pain, unspecified chronicity  Muscle weakness (generalized)  Difficulty in walking, not elsewhere classified  Stiffness of left knee, not elsewhere classified  Rationale for Evaluation and Treatment: Rehabilitation  ONSET DATE: DOS 07/30/2022   SUBJECTIVE:   SUBJECTIVE STATEMENT: Pt is 15  weeks and 1 day post op.  She reports no pain at entry.  Pt was performing some cleaning in her B/R and had knee pain when sitting on tub.  Pt denies any adverse effects after prior Rx.  Pt reports compliance with HEP.  Pt states she feels a twinge when descending stairs though has good control.  Pt denies any difficulty with ascending and descending stairs.       PERTINENT HISTORY: -Status post left knee medial meniscal root repair 07/30/2022.  -MRI findings of L knee moderate medial compartment deg changes -Pt has a Hx of Baker's cyst.   -Osteoporosis, OA, and lumbar pain  PAIN:  Are you having pain? Yes NPRS:  0/10 current, 2/10 worst Location:  L medial knee  PRECAUTIONS: Other: per surgical protocol and Wb'ing  restrictions  WEIGHT BEARING RESTRICTIONS: Yes Advance WB'ing  FALLS:  Has patient fallen in last 6 months? No  LIVING ENVIRONMENT: Lives with: lives alone Lives in: 1 story home Stairs: ramp to enter home.  Pt has 1 step in home with a rail.  Has following equipment at home:  FWW.  Sister has a cane.  OCCUPATION: Pt is retired  PLOF: Independent.  Pt was able to perform her normal functional mobility skills and daily activities independently without difficulty.  Pt ambulated without an AD.  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    PATIENT GOALS: return to PLOF    OBJECTIVE:   DIAGNOSTIC FINDINGS: Pt is post op.  MRI before surgery also showed mod medial compartment deg changes with mild patellofemoral and lateral compartment deg chondrosis and a tiny Baker's cyst.    TODAY'S TREATMENT:   Assessed knee ROM, LE strength, gait, and stairs. L knee flexion AROM:  110 deg L LE strength:  Hip flex:  5/5, Hip abd:  5/5, Hip ext:  4/5.  Knee extension:  5/5, knee flexion:  4+/5  Gait:  Pt ambulates with a good heel to toe gait pattern without limping. Stairs:  Pt ascended and descended stairs with a reciprocal gait with the rail.   Reviewed current function, HEP compliance, pain levels, and response to prior Rx.   Therapeutic Exercise: Bike full revolutions x 5 min L3 White leg press 70# x15 reps, 75# 2x10 Leg extension machine 15lbs 2x15 Seated HS curl with GTB x12 Prone HS curl with 2# 2x10 Modified SLDL x10 (to chair)   PATIENT EDUCATION:  Education details: HEP, objective findings and progress, exercise form, relevant anatomy, and POC.  Person educated: Patient Education method: Explanation, Demonstration, Tactile cues, Verbal cues Education comprehension: verbalized understanding, returned demonstration, verbal cues required, tactile cues required, and needs further education  HOME EXERCISE PROGRAM: Access Code: [email or web address] URL:  [email or web address]   ASSESSMENT:  CLINICAL IMPRESSION: Pt has made great progress in PT.  Pt demonstrates improved strength t/o L LE having 5/5 quad strength and does have some weakness in L hip extension and knee flexion.  Pt is ambulating with a good heel to to gait pattern without limping.  She performs stairs with a reciprocal gait with the rail.  She continues to have some stiffness in knee flexion though has made progress overall.  PT discussed HEP with pt and educated pt in home exercises.  PT also instructed pt in proper set up of gym exercises.  Pt has met all goals except knee ROM goal.  Pt required much instruction in correct form with modified SLDL to chair  and had difficulty with performing correctly.  Pt would benefit from 1 more visit to ensure good understanding of HEP and establish independence with long term HEP.  Pt responded well to Rx having no c/o's and no pain after Rx.        OBJECTIVE IMPAIRMENTS: Abnormal gait, decreased activity tolerance, decreased endurance, decreased knowledge of use of DME, decreased mobility, difficulty walking, decreased ROM, decreased strength, hypomobility, increased edema, impaired flexibility, and pain.   ACTIVITY LIMITATIONS: bending, standing, squatting, stairs, transfers, bed mobility, dressing, and locomotion level  PARTICIPATION LIMITATIONS: meal prep, cleaning, laundry, driving, shopping, community activity, and volunteer work  PERSONAL FACTORS: 3+ comorbidities: moderate deg changes, osteoporosis, and lumbar pain  are also affecting patient's functional outcome.   REHAB POTENTIAL: Good  CLINICAL DECISION MAKING: Stable/uncomplicated  EVALUATION COMPLEXITY: Low   GOALS:  SHORT TERM GOALS: Target date: 08/30/2022  Pt will be independent and compliant with HEP for improved pain, ROM, strength, and function.  Baseline: Goal status: GOAL MET  2.  Pt will progress with Wb'ing per MD orders without adverse effects  for improved mobility.  Baseline:  Goal status: GOAL MET  3.  Pt will demo L knee flexion PROM to be 90 deg for improved stiffness and mobility.  Baseline:  Goal status: achieved   4.  Pt will demo a good quad set and perform a supine SLR independently without significant extensor lag for improved strength.  Baseline:  Goal status:achieved  5.  Pt will be able to perform a 6 inch step up with good form and control.  Baseline:  Goal status: GOAL MET Target date:  10/11/2022  6.  Pt will ambulate with a normalized heel to toe gait pattern without AD.  Baseline:  Goal status:  GOAL MET Target date:  10/25/2022  7.  Pt will demo L knee AROM to 0 - 115 deg for improved mobility and stiffness.  Baseline:  Goal status: PROGRESSING Target date:  10/25/2022   LONG TERM GOALS: Target date: 11/26/2022   Pt will ambulate extended community distance without difficulty and significant pain.  Baseline:  Goal status: MET 6/17  2.  Pt will be able to perform stairs with a reciprocal gait with the rail.   Baseline:  Goal status:  MET 6/17  3.  Pt will be able to perform her ADLs and IADLs without significant pain and difficulty.  Baseline:  Goal status:MET 6/17  4.  Pt will be able to perform her volunteer work without adverse effects.  Baseline:  Goal status: MET 6/17  5.  Pt will demo 5/5 strength in L hip flex and abd and at least 4+/5 strength in L knee flex and extension for improved performance of and tolerance with functional mobility.  Baseline:  Goal status: GOAL MET 6/25     PLAN:  PT FREQUENCY:  2x/wk  PT DURATION: other: 5 weeks  PLANNED INTERVENTIONS: Therapeutic exercises, Therapeutic activity, Neuromuscular re-education, Balance training, Gait training, Patient/Family education, Self Care, Joint mobilization, Stair training, Aquatic Therapy, Dry Needling, Electrical stimulation, Cryotherapy, Moist heat, scar mobilization, Taping, Ultrasound, Manual therapy, and  Re-evaluation  PLAN FOR NEXT SESSION:  Cont per Dr. Serena Croissant meniscus repair protocol.  Probable discharge next visit.  Establish advanced/long term HEP.      Audie Clear III PT, DPT 11/14/22 11:22 PM

## 2022-11-14 ENCOUNTER — Encounter (HOSPITAL_BASED_OUTPATIENT_CLINIC_OR_DEPARTMENT_OTHER): Payer: Self-pay | Admitting: Physical Therapy

## 2022-11-14 DIAGNOSIS — F3341 Major depressive disorder, recurrent, in partial remission: Secondary | ICD-10-CM | POA: Diagnosis not present

## 2022-11-15 ENCOUNTER — Ambulatory Visit (HOSPITAL_BASED_OUTPATIENT_CLINIC_OR_DEPARTMENT_OTHER): Payer: Medicare PPO | Admitting: Physical Therapy

## 2022-11-15 ENCOUNTER — Encounter (HOSPITAL_BASED_OUTPATIENT_CLINIC_OR_DEPARTMENT_OTHER): Payer: Self-pay | Admitting: Physical Therapy

## 2022-11-15 DIAGNOSIS — M25562 Pain in left knee: Secondary | ICD-10-CM | POA: Diagnosis not present

## 2022-11-15 DIAGNOSIS — R262 Difficulty in walking, not elsewhere classified: Secondary | ICD-10-CM | POA: Diagnosis not present

## 2022-11-15 DIAGNOSIS — M6281 Muscle weakness (generalized): Secondary | ICD-10-CM

## 2022-11-15 DIAGNOSIS — M25662 Stiffness of left knee, not elsewhere classified: Secondary | ICD-10-CM | POA: Diagnosis not present

## 2022-11-15 NOTE — Therapy (Signed)
OUTPATIENT PHYSICAL THERAPY TREATMENT NOTE        Patient Name: [name] [name] MRN: [id number] DOB:21-Feb-1956, 67 y.o., female Today's Date: 11/16/2022  END OF SESSION:  PT End of Session - 11/15/22 1415     Visit Number 28    Number of Visits 28    Date for PT Re-Evaluation 11/26/22    Authorization Type [employer] MCR    PT Start Time 1405    PT Stop Time 1447    PT Time Calculation (min) 42 min    Activity Tolerance Patient tolerated treatment well    Behavior During Therapy WFL for tasks assessed/performed                     Past Medical History:  Diagnosis Date   Arthritis    Bunion    left foot   Depression    stable on medication   Diverticulitis 2015   Diverticulitis of colon without hemorrhage 09/28/2013   Diverticulosis    Gallstones    GERD (gastroesophageal reflux disease)    Hammer toes of both feet    Hyperlipidemia    no meds needed   IBS (irritable bowel syndrome)    Meniscal injury    Tear   Onychomycosis of toenail    Osteopenia    Osteoporosis    PUD (peptic ulcer disease) 1980's   Renal cyst    4.5-6 cm stable single  seen by uro in past   Past Surgical History:  Procedure Laterality Date   CHOLECYSTECTOMY  1987   COLONOSCOPY     IRRIGATION AND DEBRIDEMENT SEBACEOUS CYST Right 3/ 2014   chest wall   KNEE ARTHROSCOPY WITH MENISCAL REPAIR Left 07/30/2022   Procedure: LEFT KNEE ARTHROSCOPY WITH MEDIAL MENISCAL ROOT REPAIR;  Surgeon: Huel Cote, MD;  Location: [facility];  Service: Orthopedics;  Laterality: Left;   UPPER GASTROINTESTINAL ENDOSCOPY     WISDOM TOOTH EXTRACTION  age 35 & 85   lower teeth done first and upper wisdom teeth last   Patient Active Problem List   Diagnosis Date Noted   Acute medial meniscus tear of left knee 07/30/2022   Elevated alkaline phosphatase measurement 09/26/2014   Renal cyst, left 12/31/2013   Osteopenia 12/31/2013   Vitamin D deficiency 12/31/2013   Postmenopausal  atrophic vaginitis 12/31/2013   Family history of colon cancer 10/02/2013   Wrist pain, right 05/13/2013   Hx of fall 05/13/2013   Hypercholesteremia 05/13/2013   History of kidney stones    Musculoskeletal leg pain 02/02/2013   Gait abnormality 02/02/2013   Chest discomfort 05/28/2011   Renal cyst 12/01/2010   Cough 09/27/2010   Neck pain 08/29/2010   Allergic rhinitis, seasonal 08/29/2010   VARICOSE VEINS LOWER EXTREMITIES W/OTH COMPS 12/06/2008   BRACHIAL NEURITIS OR RADICULITIS NOS 10/29/2008   HYPERLIPIDEMIA NEC/NOS 01/10/2007   DEPRESSION 01/08/2007   GERD 01/08/2007   Diverticulosis of colon (without mention of hemorrhage) 07/28/2002     REFERRING PROVIDER: Huel Cote, MD   REFERRING DIAG: M23.92 (ICD-10-CM) - Acute internal derangement of left knee     S/p L knee medial meniscal root repair  THERAPY DIAG:  Left knee pain, unspecified chronicity  Muscle weakness (generalized)  Difficulty in walking, not elsewhere classified  Stiffness of left knee, not elsewhere classified  Rationale for Evaluation and Treatment: Rehabilitation  ONSET DATE: DOS 07/30/2022   SUBJECTIVE:   SUBJECTIVE STATEMENT: Pt is 15 weeks and 3 days post op.  Pt  denies knee pain currently.  Pt denies any adverse effects after prior Rx.  Pt reports compliance with HEP.  Pt denies any limitations with her daily activities.  Pt states she is able to perform her part time work activities without significant difficulty.  Pt states she may go to the [facility].  Pt states she is ready for discharge.    PERTINENT HISTORY: -Status post left knee medial meniscal root repair 07/30/2022.  -MRI findings of L knee moderate medial compartment deg changes -Pt has a Hx of Baker's cyst.   -Osteoporosis, OA, and lumbar pain  PAIN:  Are you having pain? Yes NPRS:  0/10 current, 2/10 worst Location:  L medial knee  PRECAUTIONS: Other: per surgical protocol and Wb'ing restrictions  WEIGHT BEARING  RESTRICTIONS: Yes Advance WB'ing  FALLS:  Has patient fallen in last 6 months? No  LIVING ENVIRONMENT: Lives with: lives alone Lives in: 1 story home Stairs: ramp to enter home.  Pt has 1 step in home with a rail.  Has following equipment at home:  FWW.  Sister has a cane.  OCCUPATION: Pt is retired  PLOF: Independent.  Pt was able to perform her normal functional mobility skills and daily activities independently without difficulty.  Pt ambulated without an AD.  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    PATIENT GOALS: return to PLOF    OBJECTIVE:   DIAGNOSTIC FINDINGS: Pt is post op.  MRI before surgery also showed mod medial compartment deg changes with mild patellofemoral and lateral compartment deg chondrosis and a tiny Baker's cyst.    Prior Visit:  Assessed knee ROM, LE strength, gait, and stairs. L knee flexion AROM:  110 deg L LE strength:  Hip flex:  5/5, Hip abd:  5/5, Hip ext:  4/5.  Knee extension:  5/5, knee flexion:  4+/5  Gait:  Pt ambulates with a good heel to toe gait pattern without limping. Stairs:  Pt ascended and descended stairs with a reciprocal gait with the rail.   TODAY'S TREATMENT:   Therapeutic Exercise: Bike full revolutions x 6 min L3 White leg press 75# 3x10 Leg extension machine 15lbs x15 Seated HS curl with GTB 2x10 Prone HS curl x10 Lateral band walks with RTB around ankles x 2 laps LAQ 5# x 15 reps  PT extensively went through HEP with pt.  PT updated HEP and gave pt a HEP handout.  PT educated pt in correct form and appropriate frequency and instructed pt in correct sets/reps and appropriate resistance with exercises.  PT instructed pt in appropriate gym exercises.   PATIENT EDUCATION:  Education details: HEP, exercise form, set up of gym exercises, progression of exercises, appropriate resistance with exercises, relevant anatomy, and POC.  Person educated: Patient Education method:  Explanation, Demonstration, Verbal cues, handout Education comprehension: verbalized understanding, returned demonstration, verbal cues required  HOME EXERCISE PROGRAM: Access Code: [email or web address] URL: [email or web address]  Updated HEP: - Seated Hamstring Curls with Resistance  - 1 x daily - 3 x weekly - 3 sets - 10 reps - Prone Knee Flexion  - 1 x daily - 7 x weekly - 2 sets - 10 reps - Side Stepping with Resistance at Ankles and Counter Support  - 1 x daily - 3 x weekly - 2-3 sets - 10 reps - Seated Long Arc Quad with Ankle Weight  - 3 x weekly - 2-3 sets - 10 reps   ASSESSMENT:  CLINICAL IMPRESSION: Pt  has made great progress in PT.  Pt is able to perform her daily activities and normal functional mobility skills well without significant difficulty and limitation.   Pt states she is able to perform her part time work activities without significant difficulty.  She performs stairs with a reciprocal gait with the rail.  She continues to have some stiffness in knee flexion though has made progress overall.  PT extensively went through HEP with pt and updated her HEP.  Pt also instructed in proper set up of gym exercises and appropriate gym exercises.  Pt demonstrates good understanding and is independent with HEP.  Pt has met all goals except knee ROM goal.  She is ready for discharge.      OBJECTIVE IMPAIRMENTS: Abnormal gait, decreased activity tolerance, decreased endurance, decreased knowledge of use of DME, decreased mobility, difficulty walking, decreased ROM, decreased strength, hypomobility, increased edema, impaired flexibility, and pain.   ACTIVITY LIMITATIONS: bending, standing, squatting, stairs, transfers, bed mobility, dressing, and locomotion level  PARTICIPATION LIMITATIONS: meal prep, cleaning, laundry, driving, shopping, community activity, and volunteer work  PERSONAL FACTORS: 3+ comorbidities: moderate deg changes, osteoporosis, and lumbar pain  are also  affecting patient's functional outcome.   REHAB POTENTIAL: Good  CLINICAL DECISION MAKING: Stable/uncomplicated  EVALUATION COMPLEXITY: Low   GOALS:  SHORT TERM GOALS: Target date: 08/30/2022  Pt will be independent and compliant with HEP for improved pain, ROM, strength, and function.  Baseline: Goal status: GOAL MET  2.  Pt will progress with Wb'ing per MD orders without adverse effects for improved mobility.  Baseline:  Goal status: GOAL MET  3.  Pt will demo L knee flexion PROM to be 90 deg for improved stiffness and mobility.  Baseline:  Goal status: achieved   4.  Pt will demo a good quad set and perform a supine SLR independently without significant extensor lag for improved strength.  Baseline:  Goal status:achieved  5.  Pt will be able to perform a 6 inch step up with good form and control.  Baseline:  Goal status: GOAL MET Target date:  10/11/2022  6.  Pt will ambulate with a normalized heel to toe gait pattern without AD.  Baseline:  Goal status:  GOAL MET Target date:  10/25/2022  7.  Pt will demo L knee AROM to 0 - 115 deg for improved mobility and stiffness.  Baseline:  Goal status: PROGRESSING Target date:  10/25/2022   LONG TERM GOALS: Target date: 11/26/2022   Pt will ambulate extended community distance without difficulty and significant pain.  Baseline:  Goal status: MET 6/17  2.  Pt will be able to perform stairs with a reciprocal gait with the rail.   Baseline:  Goal status:  MET 6/17  3.  Pt will be able to perform her ADLs and IADLs without significant pain and difficulty.  Baseline:  Goal status:MET 6/17  4.  Pt will be able to perform her volunteer work without adverse effects.  Baseline:  Goal status: MET 6/17  5.  Pt will demo 5/5 strength in L hip flex and abd and at least 4+/5 strength in L knee flex and extension for improved performance of and tolerance with functional mobility.  Baseline:  Goal status: GOAL MET  6/25     PLAN:   PLANNED INTERVENTIONS: Therapeutic exercises, Therapeutic activity, Neuromuscular re-education, Balance training, Gait training, Patient/Family education, Self Care, Joint mobilization, Stair training, Aquatic Therapy, Dry Needling, Electrical stimulation, Cryotherapy, Moist heat, scar mobilization, Taping, Ultrasound,  Manual therapy, and Re-evaluation  PLAN FOR NEXT SESSION:  Pt to be discharged due to meeting all goals except knee ROM goal.  Pt is independent with HEP and will cont with HEP and appropriate gym exercises.  Pt is agreeable with discharge.     PHYSICAL THERAPY DISCHARGE SUMMARY  Visits from Start of Care: 28  Current functional level related to goals / functional outcomes: See above   Remaining deficits: See above   Education / Equipment: HEP    Audie Clear III PT, DPT 11/16/22 4:05 PM

## 2022-11-20 ENCOUNTER — Encounter (HOSPITAL_BASED_OUTPATIENT_CLINIC_OR_DEPARTMENT_OTHER): Payer: Medicare PPO | Admitting: Physical Therapy

## 2022-11-23 ENCOUNTER — Ambulatory Visit
Admission: RE | Admit: 2022-11-23 | Discharge: 2022-11-23 | Disposition: A | Discharge status: Home / Self Care | Payer: Medicare PPO | Source: Ambulatory Visit | Attending: Urgent Care | Admitting: Urgent Care

## 2022-11-23 VITALS — BP 136/76 | HR 78 | Temp 98.3°F | Resp 18 | Ht 65.0 in | Wt 182.0 lb

## 2022-11-23 DIAGNOSIS — B9789 Other viral agents as the cause of diseases classified elsewhere: Secondary | ICD-10-CM

## 2022-11-23 DIAGNOSIS — J019 Acute sinusitis, unspecified: Secondary | ICD-10-CM

## 2022-11-23 LAB — POC SARS CORONAVIRUS 2 AG -  ED: SARS Coronavirus 2 Ag: NEGATIVE

## 2022-11-23 MED ORDER — PREDNISONE 20 MG PO TABS: 20.0000 mg | ORAL_TABLET | Freq: Every day | ORAL | 0 refills | Status: AC

## 2022-11-23 MED ORDER — FLUTICASONE PROPIONATE 50 MCG/ACT NA SUSP: 1.0000 | Freq: Every day | NASAL | 0 refills | Status: DC

## 2022-11-23 MED ORDER — MONTELUKAST SODIUM 10 MG PO TABS: 10.0000 mg | ORAL_TABLET | Freq: Every day | ORAL | 0 refills | Status: DC

## 2022-11-23 NOTE — Discharge Instructions (Addendum)
Your covid test is negative. You do have a middle ear effusion on the R that likely indicates a viral sinusitis. I have called in several medications to help improve your symptoms. Please start taking prednisone daily in the morning with breakfast. Start using one spray of the nasal spray twice daily until symptoms resolved. Start taking one tablet of montelukast at night, this medication may make you sleepy.  Try humidification from a warm shower, this may help open up your nasal passages.

## 2022-11-23 NOTE — ED Triage Notes (Signed)
Patient c/o post nasal drainage and sinus headache x 1 day.  Patient has taken a Zyrtec and nasal nose spray w/o relief.

## 2022-11-23 NOTE — ED Provider Notes (Signed)
[facility]    CSN: [id number] Arrival date & time: 11/23/22  1448      History   Chief Complaint Chief Complaint  Patient presents with   Sinus Headache    HPI [name] [name] is a 67 y.o. female.   67 year old female presents today due to concerns of a sinus headache.  Primarily located in her frontal regions with a little bit in the maxillary regions.  It is started abruptly yesterday.  No known sick contacts.  She states she feels a subjective fever, rundown.  She also has significant postnasal drainage that tastes "infectious."  Mild sore throat and pressure in her ears.  Very seldom intermittent dry cough.  No shortness of breath or chest pain.  No nausea or vomiting.  Did try a Zyrtec over-the-counter without symptom improvement. Heading to [facility] on Sunday, wants to make sure she is well.      Past Medical History:  Diagnosis Date   Arthritis    Bunion    left foot   Depression    stable on medication   Diverticulitis 2015   Diverticulitis of colon without hemorrhage 09/28/2013   Diverticulosis    Gallstones    GERD (gastroesophageal reflux disease)    Hammer toes of both feet    Hyperlipidemia    no meds needed   IBS (irritable bowel syndrome)    Meniscal injury    Tear   Onychomycosis of toenail    Osteopenia    Osteoporosis    PUD (peptic ulcer disease) 1980's   Renal cyst    4.5-6 cm stable single  seen by uro in past    Patient Active Problem List   Diagnosis Date Noted   Acute medial meniscus tear of left knee 07/30/2022   Elevated alkaline phosphatase measurement 09/26/2014   Renal cyst, left 12/31/2013   Osteopenia 12/31/2013   Vitamin D deficiency 12/31/2013   Postmenopausal atrophic vaginitis 12/31/2013   Family history of colon cancer 10/02/2013   Wrist pain, right 05/13/2013   Hx of fall 05/13/2013   Hypercholesteremia 05/13/2013   History of kidney stones    Musculoskeletal leg pain 02/02/2013   Gait  abnormality 02/02/2013   Chest discomfort 05/28/2011   Renal cyst 12/01/2010   Cough 09/27/2010   Neck pain 08/29/2010   Allergic rhinitis, seasonal 08/29/2010   VARICOSE VEINS LOWER EXTREMITIES W/OTH COMPS 12/06/2008   BRACHIAL NEURITIS OR RADICULITIS NOS 10/29/2008   HYPERLIPIDEMIA NEC/NOS 01/10/2007   DEPRESSION 01/08/2007   GERD 01/08/2007   Diverticulosis of colon (without mention of hemorrhage) 07/28/2002    Past Surgical History:  Procedure Laterality Date   CHOLECYSTECTOMY  1987   COLONOSCOPY     IRRIGATION AND DEBRIDEMENT SEBACEOUS CYST Right 3/ 2014   chest wall   KNEE ARTHROSCOPY WITH MENISCAL REPAIR Left 07/30/2022   Procedure: LEFT KNEE ARTHROSCOPY WITH MEDIAL MENISCAL ROOT REPAIR;  Surgeon: Huel Cote, MD;  Location: [facility];  Service: Orthopedics;  Laterality: Left;   UPPER GASTROINTESTINAL ENDOSCOPY     WISDOM TOOTH EXTRACTION  age 58 & 23   lower teeth done first and upper wisdom teeth last    OB History     Gravida  0   Para  0   Term  0   Preterm  0   AB  0   Living  0      SAB  0   IAB  0   Ectopic  0  Multiple  0   Live Births  0            Home Medications    Prior to Admission medications   Medication Sig Start Date End Date Taking? Authorizing Provider  Calcium Carb-Cholecalciferol (CALCIUM 600+D3 PO) Take 1 tablet by mouth daily.   Yes [provider]  clobetasol ointment (TEMOVATE) 0.05 % Apply 1 Application topically 2 (two) times daily. Apply as directed twice daily for up to 2 weeks, then change to 1-2 x a week. 10/05/22  Yes Romualdo Bolk, MD  denosumab (PROLIA) 60 MG/ML SOSY injection Inject 60 mg into the skin every 6 (six) months.   Yes [provider]  FLUoxetine (PROZAC) 10 MG tablet Take 5 mg by mouth daily. 04/27/21  Yes [provider]  FLUoxetine (PROZAC) 40 MG capsule Take 40 mg by mouth daily. 07/23/19  Yes [provider]  fluticasone (FLONASE) 50  MCG/ACT nasal spray Place 1 spray into both nostrils daily. 11/23/22  Yes Nash Bolls L, PA  montelukast (SINGULAIR) 10 MG tablet Take 1 tablet (10 mg total) by mouth at bedtime. 11/23/22  Yes Elian Gloster L, PA  Multiple Vitamins-Minerals (CENTRUM ULTRA WOMENS PO) Take 1 tablet by mouth every other day.   Yes [provider]  omeprazole (PRILOSEC) 20 MG capsule Take by mouth. 04/28/20  Yes [provider]  predniSONE (DELTASONE) 20 MG tablet Take 1 tablet (20 mg total) by mouth daily with breakfast for 5 days. 11/23/22 11/28/22 Yes Ziya Coonrod L, PA  Probiotic Product (PROBIOTIC PO) Take by mouth daily.   Yes [provider]  CEQUA 0.09 % SOLN Apply 1 drop to eye 2 (two) times daily. 10/02/22   [provider]    Family History Family History  Problem Relation Age of Onset   Osteoarthritis Mother    COPD Mother    Pneumonia Mother    Osteoporosis Mother    Rheum arthritis Father    Pneumonia Father        deceased   Colon cancer Father 57   Heart attack Father    Breast cancer Sister 60   Colon polyps Sister    Cancer Maternal Grandmother        ? GI   Heart failure Maternal Grandfather    Breast cancer Paternal Grandmother 76   Heart failure Paternal Grandfather    Esophageal cancer Neg Hx    Rectal cancer Neg Hx    Stomach cancer Neg Hx     Social History Social History   Tobacco Use   Smoking status: Never   Smokeless tobacco: Never  Vaping Use   Vaping Use: Never used  Substance Use Topics   Alcohol use: No   Drug use: No    Comment: >16, >5     Allergies   Fosamax [alendronate sodium] and Tylenol [acetaminophen]   Review of Systems Review of Systems As per HPI  Physical Exam Triage Vital Signs ED Triage Vitals [11/23/22 1501]  Enc Vitals Group     BP 136/76     Pulse Rate 78     Resp 18     Temp 98.3 F (36.8 C)     Temp Source Oral     SpO2 95 %     Weight 182 lb (82.6 kg)     Height 5\' 5"  (1.651 m)      Head Circumference      Peak Flow      Pain Score 4  Pain Loc      Pain Edu?      Excl. in GC?    No data found.  Updated Vital Signs BP 136/76 (BP Location: Right Arm)   Pulse 78   Temp 98.3 F (36.8 C) (Oral)   Resp 18   Ht 5\' 5"  (1.651 m)   Wt 182 lb (82.6 kg)   LMP 11/18/2004   SpO2 95%   BMI 30.29 kg/m   Visual Acuity Right Eye Distance:   Left Eye Distance:   Bilateral Distance:    Right Eye Near:   Left Eye Near:    Bilateral Near:     Physical Exam Vitals and nursing note reviewed.  Constitutional:      Appearance: Normal appearance. She is well-developed and normal weight. She is not ill-appearing, toxic-appearing or diaphoretic.  HENT:     Head: Normocephalic and atraumatic.     Right Ear: Ear canal and external ear normal. No drainage, swelling or tenderness. A middle ear effusion is present. There is no impacted cerumen. Tympanic membrane is not erythematous.     Left Ear: Tympanic membrane, ear canal and external ear normal. No drainage, swelling or tenderness.  No middle ear effusion. There is no impacted cerumen. Tympanic membrane is not erythematous.     Nose: Rhinorrhea present. Rhinorrhea is clear.     Mouth/Throat:     Mouth: Mucous membranes are moist. No oral lesions.     Pharynx: Oropharynx is clear. No pharyngeal swelling, oropharyngeal exudate, posterior oropharyngeal erythema or uvula swelling.     Tonsils: No tonsillar exudate or tonsillar abscesses.  Eyes:     General: No scleral icterus.       Right eye: No discharge.        Left eye: No discharge.     Extraocular Movements: Extraocular movements intact.     Right eye: Normal extraocular motion.     Left eye: Normal extraocular motion.     Conjunctiva/sclera: Conjunctivae normal.     Pupils: Pupils are equal, round, and reactive to light.  Neck:     Thyroid: No thyromegaly.  Cardiovascular:     Rate and Rhythm: Normal rate.     Heart sounds: Normal heart sounds. No murmur  heard. Pulmonary:     Effort: Pulmonary effort is normal. No respiratory distress.     Breath sounds: Normal breath sounds. No stridor. No wheezing, rhonchi or rales.  Chest:     Chest wall: No tenderness.  Abdominal:     General: Bowel sounds are normal. There is no distension.     Palpations: Abdomen is soft. There is no mass.     Tenderness: There is no abdominal tenderness. There is no guarding or rebound.     Hernia: No hernia is present.  Musculoskeletal:     Cervical back: Normal range of motion and neck supple. No rigidity or tenderness.  Lymphadenopathy:     Cervical: No cervical adenopathy.  Skin:    General: Skin is warm and dry.     Capillary Refill: Capillary refill takes less than 2 seconds.     Coloration: Skin is not jaundiced or pale.     Findings: No bruising, erythema or rash.  Neurological:     General: No focal deficit present.     Mental Status: She is alert and oriented to person, place, and time.     Sensory: No sensory deficit.     Motor: No weakness.  Psychiatric:  Mood and Affect: Mood normal.        Behavior: Behavior normal.      UC Treatments / Results  Labs (all labs ordered are listed, but only abnormal results are displayed) Labs Reviewed  POC SARS CORONAVIRUS 2 AG -  ED    EKG   Radiology No results found.  Procedures Procedures (including critical care time)  Medications Ordered in UC Medications - No data to display  Initial Impression / Assessment and Plan / UC Course  I have reviewed the triage vital signs and the nursing notes.  Pertinent labs & imaging results that were available during my care of the patient were reviewed by me and considered in my medical decision making (see chart for details).     Acute viral sinusitis - sx x 24 hours. No fever or radiation. Suspect viral sinusitis. R sided middle ear effusion noted. Will start flonase, prednisone and singulair.  Humidification from shower may be beneficial.  Covid test in office negative.   Final Clinical Impressions(s) / UC Diagnoses   Final diagnoses:  Acute viral sinusitis     Discharge Instructions      Your covid test is negative. You do have a middle ear effusion on the R that likely indicates a viral sinusitis. I have called in several medications to help improve your symptoms. Please start taking prednisone daily in the morning with breakfast. Start using one spray of the nasal spray twice daily until symptoms resolved. Start taking one tablet of montelukast at night, this medication may make you sleepy.  Try humidification from a warm shower, this may help open up your nasal passages.      ED Prescriptions     Medication Sig Dispense Auth. Provider   fluticasone (FLONASE) 50 MCG/ACT nasal spray Place 1 spray into both nostrils daily. 16 mL Doxie Augenstein L, PA   montelukast (SINGULAIR) 10 MG tablet Take 1 tablet (10 mg total) by mouth at bedtime. 10 tablet Ralene Gasparyan L, PA   predniSONE (DELTASONE) 20 MG tablet Take 1 tablet (20 mg total) by mouth daily with breakfast for 5 days. 5 tablet Marlon Vonruden L, [place]      PDMP not reviewed this encounter.   Maretta Bees, [place] 11/23/22 2225

## 2022-12-04 ENCOUNTER — Encounter (HOSPITAL_BASED_OUTPATIENT_CLINIC_OR_DEPARTMENT_OTHER): Payer: Medicare PPO | Admitting: Physical Therapy

## 2022-12-06 ENCOUNTER — Encounter (HOSPITAL_BASED_OUTPATIENT_CLINIC_OR_DEPARTMENT_OTHER): Payer: Medicare PPO | Admitting: Physical Therapy

## 2022-12-10 DIAGNOSIS — M810 Age-related osteoporosis without current pathological fracture: Secondary | ICD-10-CM | POA: Diagnosis not present

## 2022-12-10 DIAGNOSIS — R7309 Other abnormal glucose: Secondary | ICD-10-CM | POA: Diagnosis not present

## 2022-12-12 DIAGNOSIS — R739 Hyperglycemia, unspecified: Secondary | ICD-10-CM | POA: Diagnosis not present

## 2022-12-26 DIAGNOSIS — E669 Obesity, unspecified: Secondary | ICD-10-CM | POA: Diagnosis not present

## 2022-12-26 DIAGNOSIS — M810 Age-related osteoporosis without current pathological fracture: Secondary | ICD-10-CM | POA: Diagnosis not present

## 2022-12-26 DIAGNOSIS — R7303 Prediabetes: Secondary | ICD-10-CM | POA: Diagnosis not present

## 2023-02-13 DIAGNOSIS — F3341 Major depressive disorder, recurrent, in partial remission: Secondary | ICD-10-CM | POA: Diagnosis not present

## 2023-02-19 ENCOUNTER — Ambulatory Visit: Payer: Medicare PPO | Admitting: Family Medicine

## 2023-02-19 ENCOUNTER — Ambulatory Visit: Payer: Medicare PPO

## 2023-02-19 ENCOUNTER — Encounter: Payer: Self-pay | Admitting: Family Medicine

## 2023-02-19 VITALS — BP 120/70 | HR 82 | Temp 99.2°F | Resp 16 | Ht 65.0 in | Wt 183.0 lb

## 2023-02-19 DIAGNOSIS — M25471 Effusion, right ankle: Secondary | ICD-10-CM

## 2023-02-19 DIAGNOSIS — R0781 Pleurodynia: Secondary | ICD-10-CM | POA: Diagnosis not present

## 2023-02-19 DIAGNOSIS — Z23 Encounter for immunization: Secondary | ICD-10-CM | POA: Diagnosis not present

## 2023-02-19 DIAGNOSIS — R1010 Upper abdominal pain, unspecified: Secondary | ICD-10-CM | POA: Diagnosis not present

## 2023-02-19 DIAGNOSIS — I83893 Varicose veins of bilateral lower extremities with other complications: Secondary | ICD-10-CM

## 2023-02-19 NOTE — Patient Instructions (Signed)
A few things to remember from today's visit:  Costal margin pain - Plan: DG Chest 2 View  Upper abdominal pain - Plan: Hepatic function panel, C-reactive protein  Edema of right ankle - Plan: Basic metabolic panel  Need for influenza vaccination - Plan: Flu Vaccine Trivalent High Dose (Fluad)  Rib cage pain seems to be musculoskeletal. Monitor for new symptoms. Topical icy hot may help.  If you need refills for medications you take chronically, please call your pharmacy. Do not use My Chart to request refills or for acute issues that need immediate attention. If you send a my chart message, it may take a few days to be addressed, specially if I am not in the office.  Please be sure medication list is accurate. If a new problem present, please set up appointment sooner than planned today.

## 2023-02-19 NOTE — Progress Notes (Addendum)
ACUTE VISIT Chief Complaint  Patient presents with   Abdominal Pain    X a couple months   Joint Swelling    Right ankle x months   HPI: [name] is a 67 y.o. female with a PMHx significant for HLD, GERD, and kidney stones here today complaining of abdominal pain and swelling in her ankle.  She complains of abdominal pain on the upper abdomen and right lateral rib cage for a few months. She states the pain often occurs at night (but not always) or after eating spicy or fried foods.   Abdominal Pain This is a recurrent problem. The current episode started more than 1 month ago. The onset quality is undetermined. The problem occurs intermittently. The problem has been unchanged. The pain is located in the epigastric region. The pain is moderate. The abdominal pain does not radiate. Associated symptoms include diarrhea. Pertinent negatives include no anorexia, belching, dysuria, fever, flatus, frequency, headaches, hematochezia, hematuria, melena, nausea, vomiting or weight loss. The pain is aggravated by eating. The pain is relieved by Nothing. Prior diagnostic workup includes CT scan and GI consult. Her past medical history is significant for abdominal surgery and GERD.   She notes she has had the right rib cage pain before, has been intermittently for a while, occasionally radiates to back. She endorses occasional cough in the morning. She reports having diarrhea shortly after a solid bowel movement for the last 2-3 weeks, but denies black stool, blood in the stool, or urinary symptoms.  S/P cholecystectomy in 1980s. She denies heartburn, but notes she is on omeprazole 20 mg for GERD and she thinks it is helping.  She had an abdominal CT in 2022 that with the following impression: 1.) no acute process or explanation for RUQ pain 2.) Proximal gastric underdistention. Apparent wall thickening is at least partially secondary. If gastritis is a clinical consideration, consider  endoscopy.  She is seeing gastroenterology in 1/25.  She follows with GI and planning on EGD in 05/2023.  Lab Results  Component Value Date   ALT 13 07/05/2022   AST 17 07/05/2022   ALKPHOS 84 07/05/2022   BILITOT 0.5 07/05/2022   Lab Results  Component Value Date   NA 139 07/05/2022   CL 101 07/05/2022   K 4.1 07/05/2022   CO2 30 07/05/2022   BUN 16 07/05/2022   CREATININE 0.85 07/05/2022   EGFR 75.0 10/18/2022   CALCIUM 9.2 07/05/2022   PHOS 4.0 04/21/2021   ALBUMIN 4.2 07/05/2022   GLUCOSE 94 07/05/2022   She also complains of right ankle swelling for the last 6-8 months. She states the swelling worsens throughout the day and also with extended standing or walking.  She denies any redness, ankle pain, chest pain, SOB, or palpitations.  Negative for gross hematuria, foamy urine, or decreased urine output.  Review of Systems  Constitutional:  Negative for activity change, appetite change, fever and weight loss.  HENT:  Negative for sore throat and trouble swallowing.   Respiratory:  Negative for wheezing.   Gastrointestinal:  Positive for abdominal pain and diarrhea. Negative for anorexia, flatus, hematochezia, melena, nausea and vomiting.  Genitourinary:  Negative for dysuria, frequency and hematuria.  Skin:  Negative for rash.  Neurological:  Negative for syncope, weakness and headaches.  See other pertinent positives and negatives in HPI.  Current Outpatient Medications on File Prior to Visit  Medication Sig Dispense Refill   Calcium Carb-Cholecalciferol (CALCIUM 600+D3 PO) Take 1 tablet by mouth  daily.     clobetasol ointment (TEMOVATE) 0.05 % Apply 1 Application topically 2 (two) times daily. Apply as directed twice daily for up to 2 weeks, then change to 1-2 x a week. 60 g 1   denosumab (PROLIA) 60 MG/ML SOSY injection Inject 60 mg into the skin every 6 (six) months.     FLUoxetine (PROZAC) 10 MG tablet Take 5 mg by mouth daily.     FLUoxetine (PROZAC) 40 MG  capsule Take 40 mg by mouth daily.     fluticasone (FLONASE) 50 MCG/ACT nasal spray Place 1 spray into both nostrils daily. 16 mL 0   Multiple Vitamins-Minerals (CENTRUM ULTRA WOMENS PO) Take 1 tablet by mouth every other day.     omeprazole (PRILOSEC) 20 MG capsule Take by mouth.     Probiotic Product (PROBIOTIC PO) Take by mouth daily.     No current facility-administered medications on file prior to visit.   Past Medical History:  Diagnosis Date   Arthritis    Bunion    left foot   Depression    stable on medication   Diverticulitis 2015   Diverticulitis of colon without hemorrhage 09/28/2013   Diverticulosis    Gallstones    GERD (gastroesophageal reflux disease)    Hammer toes of both feet    Hyperlipidemia    no meds needed   IBS (irritable bowel syndrome)    Meniscal injury    Tear   Onychomycosis of toenail    Osteopenia    Osteoporosis    PUD (peptic ulcer disease) 1980's   Renal cyst    4.5-6 cm stable single  seen by uro in past   Allergies  Allergen Reactions   Fosamax [Alendronate Sodium] Other (See Comments)    Intense pain   Tylenol [Acetaminophen] Other (See Comments)    "Irritates my esophagus"    Social History   Socioeconomic History   Marital status: Divorced    Spouse name: Not on file   Number of children: 0   Years of education: Not on file   Highest education level: Master's degree (e.g., MA, MS, MEng, MEd, MSW, MBA)  Occupational History   Occupation: TEACHER    Employer: [employer]  Tobacco Use   Smoking status: Never   Smokeless tobacco: Never  Vaping Use   Vaping status: Never Used  Substance and Sexual Activity   Alcohol use: No   Drug use: No    Comment: >16, >5   Sexual activity: Not on file  Other Topics Concern   Not on file  Social History Narrative   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   Non smoker    Hh of 1 ... 2 cats    Single   G0P0   Mom is now in assisted living and she has  moved into her old residence   Social Determinants of Health   Financial Resource Strain: Low Risk  (02/16/2023)   Overall Financial Resource Strain (CARDIA)    Difficulty of Paying Living Expenses: Not hard at all  Food Insecurity: No Food Insecurity (02/16/2023)   Hunger Vital Sign    Worried About Running Out of Food in the Last Year: Never true    Ran Out of Food in the Last Year: Never true  Transportation Needs: No Transportation Needs (02/16/2023)   PRAPARE - Administrator, Civil Service (Medical): No    Lack of Transportation (Non-Medical): No  Physical Activity: Insufficiently Active (02/16/2023)  Exercise Vital Sign    Days of Exercise per Week: 2 days    Minutes of Exercise per Session: 30 min  Stress: No Stress Concern Present (02/16/2023)   [employer] of Occupational Health - Occupational Stress Questionnaire    Feeling of Stress : Only a little  Social Connections: Moderately Integrated (02/16/2023)   Social Connection and Isolation Panel [NHANES]    Frequency of Communication with Friends and Family: More than three times a week    Frequency of Social Gatherings with Friends and Family: Three times a week    Attends Religious Services: More than 4 times per year    Active Member of Clubs or Organizations: Yes    Attends Banker Meetings: More than 4 times per year    Marital Status: Divorced   Vitals:   02/19/23 1417  BP: 120/70  Pulse: 82  Resp: 16  Temp: 99.2 F (37.3 C)  SpO2: 97%   Body mass index is 30.45 kg/m.  Physical Exam Vitals and nursing note reviewed.  Constitutional:      General: She is not in acute distress.    Appearance: She is well-developed.  HENT:     Head: Normocephalic and atraumatic.     Mouth/Throat:     Mouth: Mucous membranes are moist.     Pharynx: Oropharynx is clear.  Eyes:     Conjunctiva/sclera: Conjunctivae normal.  Cardiovascular:     Rate and Rhythm: Normal rate and regular rhythm.      Pulses:          Dorsalis pedis pulses are 2+ on the right side and 2+ on the left side.     Heart sounds: No murmur heard.    Comments: Medium size varicose veins LE, bilateral. Pulmonary:     Effort: Pulmonary effort is normal. No respiratory distress.     Breath sounds: Normal breath sounds.  Abdominal:     Palpations: Abdomen is soft. There is no hepatomegaly or mass.     Tenderness: There is no abdominal tenderness.    Musculoskeletal:     Lumbar back: Tenderness present.     Right lower leg: No edema.     Left lower leg: No edema.  Lymphadenopathy:     Cervical: No cervical adenopathy.  Skin:    General: Skin is warm.     Findings: No erythema or rash.  Neurological:     General: No focal deficit present.     Mental Status: She is alert and oriented to person, place, and time.     Cranial Nerves: No cranial nerve deficit.     Gait: Gait normal.  Psychiatric:        Mood and Affect: Affect normal. Mood is anxious.   ASSESSMENT AND PLAN:  Ms. Beltz was seen today for abdominal pain and swelling in her ankle.  Lab Results  Component Value Date   ALT 15 02/19/2023   AST 20 02/19/2023   ALKPHOS 89 02/19/2023   BILITOT 0.5 02/19/2023   Lab Results  Component Value Date   NA 138 02/19/2023   CL 100 02/19/2023   K 4.2 02/19/2023   CO2 30 02/19/2023   BUN 15 02/19/2023   CREATININE 0.75 02/19/2023   GFR 81.98 02/19/2023   CALCIUM 9.7 02/19/2023   PHOS 4.0 04/21/2021   ALBUMIN 4.4 02/19/2023   GLUCOSE 80 02/19/2023   Lab Results  Component Value Date   CRP <1.0 02/19/2023   Edema of right ankle Edema  is not appreciated today. We discussed possible etiologies. Most likely related to vein disease. Compression stockings and/or lower extremity elevation will help. Continue appropriate skin care. Monitor for new symptoms.  -     Basic metabolic panel; Future  Costal margin pain This has been a chronic problem, RUQ/rib cage pain. We discussed possible  causes, including musculoskeletal and radicular pain. CXR ordered today to evaluate for possible lung etiologies. Instructed about warning signs. Topical icy hot on affected area may help.  -     DG Chest 2 View; Future  Upper abdominal pain Mostly epigastric. Also seems to be going on for a few months. Reporting episodes of diarrhea for the past 2-3 weeks. ? Dyspepsia. Follows with GI and planning on EGD and colonoscopy in 05/2022. Continue Omeprazole 20 mg daily and GERD precautions.  -     Hepatic function panel; Future -     C-reactive protein; Future  Need for influenza vaccination -     Flu Vaccine Trivalent High Dose (Fluad)  Varicose veins of bilateral lower extremities with other complications Assessment & Plan: We discussed Dx and treatment options. Compression stocking recommended. She will let me know if she decides to have a vein specialist consultation to place referral.  Return if symptoms worsen or fail to improve.  I, Rolla Etienne Wierda, acting as a scribe for Cobi Delph [place], MD., have documented all relevant documentation on the behalf of [name] Slagel [place], MD, as directed by  Jaidev Sanger [place], MD while in the presence of [name] Pedigo [place], MD.   I, Hatsuko Bizzarro [place], MD, have reviewed all documentation for this visit. The documentation on 02/19/23 for the exam, diagnosis, procedures, and orders are all accurate and complete.  Deontra Pereyra G. [place], MD  [facility]. [place] office.

## 2023-02-19 NOTE — Assessment & Plan Note (Signed)
We discussed Dx and treatment options. Compression stocking recommended. She will let me know if she decides to have a vein specialist consultation to place referral.

## 2023-02-20 ENCOUNTER — Ambulatory Visit: Payer: Medicare PPO | Admitting: Gastroenterology

## 2023-02-20 LAB — HEPATIC FUNCTION PANEL
ALT: 15 U/L (ref 0–35)
AST: 20 U/L (ref 0–37)
Albumin: 4.4 g/dL (ref 3.5–5.2)
Alkaline Phosphatase: 89 U/L (ref 39–117)
Bilirubin, Direct: 0.1 mg/dL (ref 0.0–0.3)
Total Bilirubin: 0.5 mg/dL (ref 0.2–1.2)
Total Protein: 7.6 g/dL (ref 6.0–8.3)

## 2023-02-20 LAB — BASIC METABOLIC PANEL
BUN: 15 mg/dL (ref 6–23)
CO2: 30 meq/L (ref 19–32)
Calcium: 9.7 mg/dL (ref 8.4–10.5)
Chloride: 100 meq/L (ref 96–112)
Creatinine, Ser: 0.75 mg/dL (ref 0.40–1.20)
GFR: 81.98 mL/min (ref >60.00)
Glucose, Bld: 80 mg/dL (ref 70–99)
Potassium: 4.2 meq/L (ref 3.5–5.1)
Sodium: 138 meq/L (ref 135–145)

## 2023-02-20 LAB — C-REACTIVE PROTEIN: CRP: <1 mg/dL (ref 0.5–20.0)

## 2023-02-28 ENCOUNTER — Telehealth: Payer: Self-pay | Admitting: Internal Medicine

## 2023-02-28 NOTE — Telephone Encounter (Signed)
Pt is calling and would like chest xray results. Pt seen dr [place] on 02-19-2023

## 2023-03-01 NOTE — Telephone Encounter (Signed)
In your results

## 2023-03-01 NOTE — Telephone Encounter (Signed)
See result note.  

## 2023-03-01 NOTE — Telephone Encounter (Signed)
Will need to call to radiology reading room as they haven't read the x-ray yet.

## 2023-03-01 NOTE — Telephone Encounter (Signed)
Left VM with reading room to have her chest x-ray read.

## 2023-04-03 ENCOUNTER — Other Ambulatory Visit (HOSPITAL_BASED_OUTPATIENT_CLINIC_OR_DEPARTMENT_OTHER): Payer: Self-pay

## 2023-04-03 MED ORDER — COMIRNATY 30 MCG/0.3ML IM SUSY
0.3000 mL | PREFILLED_SYRINGE | Freq: Once | INTRAMUSCULAR | 0 refills | Status: AC
  Filled 2023-04-03: qty 0.3, 1d supply, fill #0

## 2023-04-23 DIAGNOSIS — M810 Age-related osteoporosis without current pathological fracture: Secondary | ICD-10-CM | POA: Diagnosis not present

## 2023-04-23 DIAGNOSIS — R7309 Other abnormal glucose: Secondary | ICD-10-CM | POA: Diagnosis not present

## 2023-04-24 DIAGNOSIS — F3341 Major depressive disorder, recurrent, in partial remission: Secondary | ICD-10-CM | POA: Diagnosis not present

## 2023-04-29 ENCOUNTER — Encounter: Payer: Self-pay | Admitting: Obstetrics and Gynecology

## 2023-04-29 ENCOUNTER — Ambulatory Visit: Payer: Medicare PPO | Admitting: Obstetrics and Gynecology

## 2023-04-29 VITALS — BP 110/68 | HR 71

## 2023-04-29 DIAGNOSIS — N644 Mastodynia: Secondary | ICD-10-CM

## 2023-04-29 MED ORDER — ROMOSOZUMAB-AQQG 105 MG/1.17ML ~~LOC~~ SOSY: 210.0000 mg | PREFILLED_SYRINGE | Freq: Once | SUBCUTANEOUS | Status: DC

## 2023-04-29 NOTE — Progress Notes (Signed)
Acute Office Visit  Subjective:    Patient ID: [name] [name], female    DOB: Dec 06, 1955, 67 y.o.   MRN: [id number]   HPI 67 y.o. presents today for breast pain (Left Breast pain for a few weeks that comes & goes//jj) No CP or SOB. No trauma no discharge or skin changes No HRT use  06/15/22 MMG normal Patient's last menstrual period was 11/18/2004.    Review of Systems     Objective:    OBGyn Exam  BP 110/68   Pulse 71   LMP 11/18/2004   SpO2 98%  Wt Readings from Last 3 Encounters:  02/19/23 183 lb (83 kg)  11/23/22 182 lb (82.6 kg)  10/05/22 186 lb 3.2 oz (84.5 kg)   Social History   Socioeconomic History   Marital status: Divorced    Spouse name: Not on file   Number of children: 0   Years of education: Not on file   Highest education level: Master's degree (e.g., MA, MS, MEng, MEd, MSW, MBA)  Occupational History   Occupation: TEACHER    Employer: [employer]  Tobacco Use   Smoking status: Never   Smokeless tobacco: Never  Vaping Use   Vaping status: Never Used  Substance and Sexual Activity   Alcohol use: No   Drug use: No    Comment: >16, >5   Sexual activity: Not Currently    Partners: Male    Birth control/protection: Post-menopausal  Other Topics Concern   Not on file  Social History Narrative   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   Non smoker    Hh of 1 ... 2 cats    Single   G0P0   Mom is now in assisted living and she has moved into her old residence   Social Determinants of Health   Financial Resource Strain: Low Risk  (02/16/2023)   Overall Financial Resource Strain (CARDIA)    Difficulty of Paying Living Expenses: Not hard at all  Food Insecurity: No Food Insecurity (02/16/2023)   Hunger Vital Sign    Worried About Running Out of Food in the Last Year: Never true    Ran Out of Food in the Last Year: Never true  Transportation Needs: No Transportation Needs (02/16/2023)   PRAPARE - Therapist, art (Medical): No    Lack of Transportation (Non-Medical): No  Physical Activity: Insufficiently Active (02/16/2023)   Exercise Vital Sign    Days of Exercise per Week: 2 days    Minutes of Exercise per Session: 30 min  Stress: No Stress Concern Present (02/16/2023)   [employer] of Occupational Health - Occupational Stress Questionnaire    Feeling of Stress : Only a little  Social Connections: Moderately Integrated (02/16/2023)   Social Connection and Isolation Panel [NHANES]    Frequency of Communication with Friends and Family: More than three times a week    Frequency of Social Gatherings with Friends and Family: Three times a week    Attends Religious Services: More than 4 times per year    Active Member of Clubs or Organizations: Yes    Attends Banker Meetings: More than 4 times per year    Marital Status: Divorced   Past Medical History:  Diagnosis Date   Arthritis    Bunion    left foot   Depression    stable on medication   Diverticulitis 2015   Diverticulitis of colon without hemorrhage 09/28/2013  Diverticulosis    Gallstones    GERD (gastroesophageal reflux disease)    Hammer toes of both feet    Hyperlipidemia    no meds needed   IBS (irritable bowel syndrome)    Meniscal injury    Tear   Onychomycosis of toenail    Osteopenia    Osteoporosis    PUD (peptic ulcer disease) 1980's   Renal cyst    4.5-6 cm stable single  seen by uro in past      Exam: no masses b/l or skin changes. Breast tenderness noted at 1 to areola with deep palpation. No retraction  Patient informed chaperone available to be present for breast and/or pelvic exam. Patient has requested no chaperone to be present. Patient has been advised what will be completed during breast and pelvic exam.   Assessment & Plan:  Breast pain: to get early diagnostic mammogram and ultrasound  Earley Favor

## 2023-05-24 ENCOUNTER — Ambulatory Visit: Payer: Medicare PPO | Admitting: Gastroenterology

## 2023-05-24 ENCOUNTER — Encounter: Payer: Self-pay | Admitting: Gastroenterology

## 2023-05-24 VITALS — BP 128/76 | HR 84 | Ht 64.5 in | Wt 181.1 lb

## 2023-05-24 DIAGNOSIS — L290 Pruritus ani: Secondary | ICD-10-CM

## 2023-05-24 DIAGNOSIS — R1011 Right upper quadrant pain: Secondary | ICD-10-CM

## 2023-05-24 DIAGNOSIS — Z800 Family history of malignant neoplasm of digestive organs: Secondary | ICD-10-CM | POA: Diagnosis not present

## 2023-05-24 DIAGNOSIS — K219 Gastro-esophageal reflux disease without esophagitis: Secondary | ICD-10-CM

## 2023-05-24 MED ORDER — NA SULFATE-K SULFATE-MG SULF 17.5-3.13-1.6 GM/177ML PO SOLN: 1.0000 | Freq: Once | ORAL | 0 refills | Status: AC

## 2023-05-24 NOTE — Patient Instructions (Addendum)
 Pruritis Ani treatment:  Citrucel fiber one tablespoon once daily.  Avoid coffee, alcohol, tomatoes, citrus fruits.  Avoid any wipe or ointment with hydrocortisone .  Use un-medicated , moist, flushable toilet wipe (toddler wipe) at first after bowel movement, then blot dry with soft cloth instead of toilet paper.    A small amount of Zinc Oxide (desitin, balmex) cream can be applied afterwards to protect the sensitive skin near the anal canal.  You have been scheduled for a colonoscopy. Please follow written instructions given to you at your visit today.   Please pick up your prep supplies at the pharmacy within the next 1-3 days.  If you use inhalers (even only as needed), please bring them with you on the day of your procedure.  DO NOT TAKE 7 DAYS PRIOR TO TEST- Trulicity (dulaglutide) Ozempic, Wegovy (semaglutide) Mounjaro (tirzepatide) Bydureon Bcise (exanatide extended release)  DO NOT TAKE 1 DAY PRIOR TO YOUR TEST Rybelsus (semaglutide) Adlyxin (lixisenatide) Victoza (liraglutide) Byetta (exanatide) ___________________________________________________________________________  _______________________________________________________  If your blood pressure at your visit was 140/90 or greater, please contact your primary care physician to follow up on this.  _______________________________________________________  If you are age 53 or older, your body mass index should be between 23-30. Your Body mass index is 30.61 kg/m. If this is out of the aforementioned range listed, please consider follow up with your Primary Care Provider.  If you are age 17 or younger, your body mass index should be between 19-25. Your Body mass index is 30.61 kg/m. If this is out of the aformentioned range listed, please consider follow up with your Primary Care Provider.   ________________________________________________________  The [facility] providers would like to encourage you to use  MYCHART to communicate with providers for non-urgent requests or questions.  Due to long hold times on the telephone, sending your provider a message by [facility] may be a faster and more efficient way to get a response.  Please allow 48 business hours for a response.  Please remember that this is for non-urgent requests.  _______________________________________________________ It was a pleasure to see you today!  Thank you for trusting me with your gastrointestinal care!

## 2023-05-24 NOTE — Progress Notes (Signed)
 [facility] Progress Note  Chief Complaint: Right upper quadrant pain and reflux  Subjective  Prior history  Longstanding GERD stable on PPI.  Last upper endoscopy October 2009. Office visit checkup August 2022, patient also complaining of intermittent right upper quadrant pain.  Previous CT scan had shown incidental interposition of the colon between the liver and diaphragm.  Repeat CT scan August 2022, and that abnormality was no longer described.  Abdominal ultrasound July 2022 normal and showing prior cholecystectomy. History of colon cancer in patient's father -no polyps on colonoscopy with Dr. Legrand November 2019.  Seen in office February 2024 for reflux symptoms, patient was concerned about osteoporosis risk from long-term PPI use, so we try to change to famotidine .  She sent a message shortly afterwards the symptoms and was not as well-controlled on this, show she went back to OTC omeprazole .  She was also having some pruritus ani at that visit with a normal exam.   Discussed the use of AI scribe software for clinical note transcription with the patient, who gave verbal consent to proceed.  History of Present Illness   The patient, with a history of gastroesophageal reflux disease (GERD) and osteoporosis, presents with intermittent right-sided discomfort. The discomfort is described as a dull ache, located in the right lateral and anterior lower rib cage area. The patient denies any specific triggers or associated symptoms such as nausea, vomiting, or fever. The discomfort does not appear to be related to meals or bowel movements.  Similar pain to what she had described when I seen her before and imaging was done.  She might notice it if she wakes up at nighttime and may be more prominent in the early morning.  Has not bothered her in the few months since she made the appointment.  The patient also reports a history of anal itching, which has been managed with various topical  treatments including diaper ointment, witch hazel, and aloe vera. The patient denies any changes in bowel habits or blood in the stool.  The patient's GERD is currently managed with over-the-counter omeprazole , which appears to control the heartburn symptoms effectively. The patient also takes a calcium supplement with vitamin D  due to a diagnosis of osteoporosis.  The patient's discomfort was previously investigated with a CT scan, which showed a high-riding colon. However, a repeat CT scan showed the colon had returned to its normal position. The patient's liver function has not been implicated in the discomfort.  The patient's symptoms appear to be intermittent and have been present since at least October of the previous year. The discomfort does not wake the patient but is noticed if the patient wakes for other reasons or in the early morning.      ROS: Cardiovascular:  no chest pain Respiratory: no dyspnea  The patient's Past Medical, Family and Social History were reviewed and are on file in the EMR. Past Medical History:  Diagnosis Date   Arthritis    Bunion    left foot   Depression    stable on medication   Diverticulitis 2015   Diverticulitis of colon without hemorrhage 09/28/2013   Diverticulosis    Gallstones    GERD (gastroesophageal reflux disease)    Hammer toes of both feet    Hyperlipidemia    no meds needed   IBS (irritable bowel syndrome)    Meniscal injury    Tear   Onychomycosis of toenail    Osteopenia    Osteoporosis  PUD (peptic ulcer disease) 1980's   Renal cyst    4.5-6 cm stable single  seen by uro in past    Past Surgical History:  Procedure Laterality Date   CHOLECYSTECTOMY  1987   COLONOSCOPY     IRRIGATION AND DEBRIDEMENT SEBACEOUS CYST Right 3/ 2014   chest wall   KNEE ARTHROSCOPY WITH MENISCAL REPAIR Left 07/30/2022   Procedure: LEFT KNEE ARTHROSCOPY WITH MEDIAL MENISCAL ROOT REPAIR;  Surgeon: Genelle Standing, MD;  Location: [facility];  Service: Orthopedics;  Laterality: Left;   UPPER GASTROINTESTINAL ENDOSCOPY     WISDOM TOOTH EXTRACTION  age 68 & 16   lower teeth done first and upper wisdom teeth last     Objective:  Med list reviewed  Current Outpatient Medications:    Calcium Carb-Cholecalciferol (CALCIUM 600+D3 PO), Take 1 tablet by mouth daily., Disp: , Rfl:    clobetasol  ointment (TEMOVATE ) 0.05 %, Apply 1 Application topically 2 (two) times daily. Apply as directed twice daily for up to 2 weeks, then change to 1-2 x a week., Disp: 60 g, Rfl: 1   denosumab  (PROLIA ) 60 MG/ML SOSY injection, Inject 60 mg into the skin every 6 (six) months., Disp: , Rfl:    FLUoxetine (PROZAC) 10 MG tablet, Take 5 mg by mouth daily., Disp: , Rfl:    FLUoxetine (PROZAC) 40 MG capsule, Take 40 mg by mouth daily., Disp: , Rfl:    fluticasone  (FLONASE ) 50 MCG/ACT nasal spray, Place 1 spray into both nostrils daily., Disp: 16 mL, Rfl: 0   Multiple Vitamins-Minerals (CENTRUM ULTRA WOMENS PO), Take 1 tablet by mouth every other day., Disp: , Rfl:    Na Sulfate-K Sulfate-Mg Sulf 17.5-3.13-1.6 GM/177ML SOLN, Take 1 kit by mouth once for 1 dose., Disp: 354 mL, Rfl: 0   omeprazole  (PRILOSEC) 20 MG capsule, Take by mouth., Disp: , Rfl:    Probiotic Product (PROBIOTIC PO), Take by mouth daily., Disp: , Rfl:   Current Facility-Administered Medications:    Romosozumab -aqqg (EVENITY ) 105 MG/1. injection 210 mg, 210 mg, Subcutaneous, Once,    Vital signs in last 24 hrs: Vitals:   05/24/23 1338  BP: 128/76  Pulse: 84   Wt Readings from Last 3 Encounters:  05/24/23 181 lb 2 oz (82.2 kg)  02/19/23 183 lb (83 kg)  11/23/22 182 lb (82.6 kg)    Physical Exam  Well-appearing HEENT: sclera anicteric, oral mucosa moist without lesions Neck: supple, no thyromegaly, JVD or lymphadenopathy Cardiac: Regular without appreciable murmur,  no peripheral edema Pulm: clear to auscultation bilaterally, normal RR and effort  noted Abdomen: soft, no tenderness, with active bowel sounds. No guarding or palpable hepatosplenomegaly. Skin; warm and dry, no jaundice or rash When standing, she showed me the area in question which is along the right lateral/anterior and posterior rib cage.  There is no abnormality of the overlying skin and no palpable abnormalities or tenderness.  Labs:   ___________________________________________ Radiologic studies:   ____________________________________________ Other:   _____________________________________________   Encounter Diagnoses  Name Primary?   RUQ pain Yes   Pruritus ani    Family history of colon cancer    Musculoskeletal chest wall pain.  Recommended some local treatments, no further abdominal imaging or endoscopy planned. Due for colonoscopy with family history of colorectal cancer in father and last colonoscopy November 2019 Pruritus ani, normal physical exam when last seen for this. Assessment and Plan    Right Upper Quadrant Pain Intermittent, non-severe, non-radiating pain. No associated nausea,  vomiting, fever, or changes in bowel habits. Likely musculoskeletal in nature, possibly costochondritis. -Apply local heat or adhesive lidocaine  patches as needed for pain relief.  Gastroesophageal Reflux Disease Controlled with over-the-counter omeprazole . Patient is compliant with dietary modifications. -Continue over-the-counter omeprazole .  Osteoporosis On calcium and vitamin D  supplementation. Receiving Prolia  injections every six months. -Continue current management.  She feels comfortable continuing the PPI for management of her reflux symptoms.  Anal Itching Persistent despite use of various topical treatments. No evidence of hemorrhoids or skin disease on previous examination. -Avoid witch hazel and soaps in the area. -Use moist flushable toilet wipes for toileting. -Consider dietary modifications. Provide patient with reading material on  potential dietary triggers.        22 minutes were spent on this encounter (including chart review, history/exam, counseling/coordination of care, and documentation) > 50% of that time was spent on counseling and coordination of care.   Victory LITTIE Brand III

## 2023-05-28 ENCOUNTER — Telehealth: Payer: Self-pay | Admitting: *Deleted

## 2023-05-28 DIAGNOSIS — M810 Age-related osteoporosis without current pathological fracture: Secondary | ICD-10-CM

## 2023-05-28 NOTE — Telephone Encounter (Signed)
 Insurance information submitted to [employer] portal. Will await summary of benefits for [employer].

## 2023-05-28 NOTE — Telephone Encounter (Signed)
-----   Message from Earley Favor sent at 04/29/2023  2:33 PM EST ----- Can you run PA for evenity She is on prolia now Thank you Dr. Leonard Schwartz

## 2023-06-05 ENCOUNTER — Encounter: Payer: Self-pay | Admitting: Obstetrics and Gynecology

## 2023-06-06 NOTE — Telephone Encounter (Signed)
PA submitted for [employer] via [email or web address]. Key: [id number]. Will await response from [facility].

## 2023-06-12 DIAGNOSIS — F3341 Major depressive disorder, recurrent, in partial remission: Secondary | ICD-10-CM | POA: Diagnosis not present

## 2023-06-13 ENCOUNTER — Encounter: Payer: Self-pay | Admitting: Obstetrics and Gynecology

## 2023-06-13 ENCOUNTER — Ambulatory Visit: Payer: Medicare PPO | Admitting: Obstetrics and Gynecology

## 2023-06-13 VITALS — BP 130/70 | HR 68 | Resp 16

## 2023-06-13 DIAGNOSIS — R351 Nocturia: Secondary | ICD-10-CM | POA: Diagnosis not present

## 2023-06-13 DIAGNOSIS — L900 Lichen sclerosus et atrophicus: Secondary | ICD-10-CM

## 2023-06-13 DIAGNOSIS — N3281 Overactive bladder: Secondary | ICD-10-CM

## 2023-06-13 DIAGNOSIS — L299 Pruritus, unspecified: Secondary | ICD-10-CM | POA: Diagnosis not present

## 2023-06-13 DIAGNOSIS — R32 Unspecified urinary incontinence: Secondary | ICD-10-CM

## 2023-06-13 LAB — URINALYSIS, COMPLETE W/RFL CULTURE
Bacteria, UA: NONE SEEN /[HPF]
Bilirubin Urine: NEGATIVE
Glucose, UA: NEGATIVE
Hgb urine dipstick: NEGATIVE
Hyaline Cast: NONE SEEN /[LPF]
Leukocyte Esterase: NEGATIVE
Nitrites, Initial: NEGATIVE
Protein, ur: NEGATIVE
RBC / HPF: NONE SEEN /[HPF] (ref 0–2)
Specific Gravity, Urine: 1.02 (ref 1.001–1.035)
WBC, UA: NONE SEEN /[HPF] (ref 0–5)
pH: 5.5 (ref 5.0–8.0)

## 2023-06-13 LAB — NO CULTURE INDICATED

## 2023-06-13 LAB — WET PREP FOR TRICH, YEAST, CLUE

## 2023-06-13 MED ORDER — ESTRADIOL 0.1 MG/GM VA CREA: 1.0000 | TOPICAL_CREAM | Freq: Every day | VAGINAL | 12 refills | Status: AC

## 2023-06-13 NOTE — Progress Notes (Signed)
   Acute Office Visit  Subjective:    Patient ID: [name] [name], female    DOB: 02/25/1956, 68 y.o.   MRN: [id number]   HPI 68 y.o. presents today for Vaginitis (Pt c/o vaginal irritation, itching, urine leakage. Pt also has a rash on breast//jj) and breast rash .Patient with history of eczema as a child and with bothersome vulvar pruritus for about a year and uisng daily clobetasol cream and it has spread up towards her clitoral region and very bothersome to her. Reports nocturia and urinary incontinence on underwear at times when she wakes up. Left breast with 2cm rash as well  Patient's last menstrual period was 11/18/2004.    Review of Systems     Objective:    OBGyn Exam  BP 130/70   Pulse 68   Resp 16   LMP 11/18/2004  Wt Readings from Last 3 Encounters:  05/24/23 181 lb 2 oz (82.2 kg)  02/19/23 183 lb (83 kg)  11/23/22 182 lb (82.6 kg)      Left breast: no palpable masses. Dry patch noted possibly eczema patch UA with ketones B/l vulva with excoriations up to clitoral hood Wet mount sent  Patient informed chaperone available to be present for breast and/or pelvic exam. Patient has requested no chaperone to be present. Patient has been advised what will be completed during breast and pelvic exam.   Assessment & Plan:  LS  To stop clobetasol cream for now.  Only wash with cool water with no soap. To apply neosporin with aloe and twice daily estrogen. To return for punch biopsy of the area due to small underlying risk for cancer with LS.  Referral placed to dermatology as well to help manage. Referral placed to urogyn for OAB and nocturia.  Dr. Arman Filter Tresa Res

## 2023-06-14 ENCOUNTER — Ambulatory Visit (INDEPENDENT_AMBULATORY_CARE_PROVIDER_SITE_OTHER): Payer: Medicare PPO

## 2023-06-14 VITALS — BP 118/80 | HR 76 | Temp 98.6°F | Ht 64.5 in | Wt 184.8 lb

## 2023-06-14 DIAGNOSIS — Z0000 Encounter for general adult medical examination without abnormal findings: Secondary | ICD-10-CM | POA: Diagnosis not present

## 2023-06-14 DIAGNOSIS — M810 Age-related osteoporosis without current pathological fracture: Secondary | ICD-10-CM | POA: Diagnosis not present

## 2023-06-14 NOTE — Patient Instructions (Addendum)
[name] [name] , Thank you for taking time to come for your Medicare Wellness Visit. I appreciate your ongoing commitment to your health goals. Please review the following plan we discussed and let me know if I can assist you in the future.   Referrals/Orders/Follow-Ups/Clinician Recommendations:   This is a list of the screening recommended for you and due dates:  Health Maintenance  Topic Date Due   Colon Cancer Screening  03/29/2023   DTaP/Tdap/Td vaccine (3 - Td or Tdap) 05/14/2023   COVID-19 Vaccine (7 - 2024-25 season) 05/29/2023   Mammogram  06/13/2024   Medicare Annual Wellness Visit  06/13/2024   Pneumonia Vaccine  Completed   Flu Shot  Completed   DEXA scan (bone density measurement)  Completed   Hepatitis C Screening  Completed   Zoster (Shingles) Vaccine  Completed   HPV Vaccine  Aged Out    Advanced directives: (Copy Requested) Please bring a copy of your health care power of attorney and living will to the office to be added to your chart at your convenience.  Next Medicare Annual Wellness Visit scheduled for next year: Yes

## 2023-06-14 NOTE — Progress Notes (Signed)
Subjective:   [name] [name] is a 68 y.o. female who presents for Medicare Annual (Subsequent) preventive examination.  Visit Complete: In person  Patient Medicare AWV questionnaire was completed by the patient on 06/12/23; I have confirmed that all information answered by patient is correct and no changes since this date.  Cardiac Risk Factors include: advanced age (>73men, >67 women)     Objective:    Today's Vitals   06/14/23 1502  BP: 118/80  Pulse: 76  Temp: 98.6 F (37 C)  TempSrc: Oral  SpO2: 97%  Weight: 184 lb 12.8 oz (83.8 kg)  Height: 5' 4.5" (1.638 m)   Body mass index is 31.23 kg/m.     06/14/2023    3:16 PM 08/02/2022    3:06 PM 07/23/2022    1:56 PM 04/25/2022    3:36 PM 04/24/2021    3:44 PM  Advanced Directives  Does Patient Have a Medical Advance Directive? Yes Yes Yes Yes Yes  Type of Estate agent of [place];Living will Healthcare Power of [place];Living will Healthcare Power of [place];Living will Healthcare Power of [place];Living will Living will  Does patient want to make changes to medical advance directive?   No - Patient declined  No - Patient declined  Copy of Healthcare Power of Attorney in Chart? No - copy requested  No - copy requested No - copy requested     Current Medications (verified) Outpatient Encounter Medications as of 06/14/2023  Medication Sig   Calcium Carb-Cholecalciferol (CALCIUM 600+D3 PO) Take 1 tablet by mouth daily.   clobetasol ointment (TEMOVATE) 0.05 % Apply 1 Application topically 2 (two) times daily. Apply as directed twice daily for up to 2 weeks, then change to 1-2 x a week.   denosumab (PROLIA) 60 MG/ML SOSY injection Inject 60 mg into the skin every 6 (six) months.   estradiol (ESTRACE VAGINAL) 0.1 MG/GM vaginal cream Place 1 Applicatorful vaginally at bedtime. Rub a quarter size amount twice daily to affected area until next seen for punch biopsy   FLUoxetine (PROZAC) 10 MG tablet Take 5  mg by mouth daily.   FLUoxetine (PROZAC) 40 MG capsule Take 40 mg by mouth daily.   fluticasone (FLONASE) 50 MCG/ACT nasal spray Place 1 spray into both nostrils daily.   Multiple Vitamins-Minerals (CENTRUM ULTRA WOMENS PO) Take 1 tablet by mouth every other day.   omeprazole (PRILOSEC) 20 MG capsule Take by mouth.   Probiotic Product (PROBIOTIC PO) Take by mouth daily.   Facility-Administered Encounter Medications as of 06/14/2023  Medication   Romosozumab-aqqg (EVENITY) 105 MG/1. injection 210 mg    Allergies (verified) Fosamax [alendronate sodium] and Tylenol [acetaminophen]   History: Past Medical History:  Diagnosis Date   Arthritis    Bunion    left foot   Depression    stable on medication   Diverticulitis 2015   Diverticulitis of colon without hemorrhage 09/28/2013   Diverticulosis    Gallstones    GERD (gastroesophageal reflux disease)    Hammer toes of both feet    Hyperlipidemia    no meds needed   IBS (irritable bowel syndrome)    Lichen sclerosus    Meniscal injury    Tear   Onychomycosis of toenail    Osteopenia    Osteoporosis    PUD (peptic ulcer disease) 1980's   Renal cyst    4.5-6 cm stable single  seen by uro in past   Past Surgical History:  Procedure Laterality Date  CHOLECYSTECTOMY  1987   COLONOSCOPY     IRRIGATION AND DEBRIDEMENT SEBACEOUS CYST Right 3/ 2014   chest wall   KNEE ARTHROSCOPY WITH MENISCAL REPAIR Left 07/30/2022   Procedure: LEFT KNEE ARTHROSCOPY WITH MEDIAL MENISCAL ROOT REPAIR;  Surgeon: Huel Cote, MD;  Location: [facility];  Service: Orthopedics;  Laterality: Left;   UPPER GASTROINTESTINAL ENDOSCOPY     WISDOM TOOTH EXTRACTION  age 85 & 57   lower teeth done first and upper wisdom teeth last   Family History  Problem Relation Age of Onset   Osteoarthritis Mother    COPD Mother    Pneumonia Mother    Osteoporosis Mother    Rheum arthritis Father    Pneumonia Father        deceased   Colon  cancer Father 53   Heart attack Father    Breast cancer Sister 26   Colon polyps Sister    Cancer Maternal Grandmother        ? GI   Heart failure Maternal Grandfather    Breast cancer Paternal Grandmother 44   Heart failure Paternal Grandfather    Esophageal cancer Neg Hx    Rectal cancer Neg Hx    Stomach cancer Neg Hx    Social History   Socioeconomic History   Marital status: Divorced    Spouse name: Not on file   Number of children: 0   Years of education: Not on file   Highest education level: Master's degree (e.g., MA, MS, MEng, MEd, MSW, MBA)  Occupational History   Occupation: TEACHER    Employer: [employer]  Tobacco Use   Smoking status: Never   Smokeless tobacco: Never  Vaping Use   Vaping status: Never Used  Substance and Sexual Activity   Alcohol use: No   Drug use: No    Comment: >16, >5   Sexual activity: Not Currently    Partners: Male    Birth control/protection: Post-menopausal  Other Topics Concern   Not on file  Social History Narrative   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   Non smoker    Hh of 1 ... 2 cats    Single   [id number]   Mom is now in assisted living and she has moved into her old residence   Social Drivers of Corporate investment banker Strain: Low Risk  (06/14/2023)   Overall Financial Resource Strain (CARDIA)    Difficulty of Paying Living Expenses: Not hard at all  Food Insecurity: No Food Insecurity (06/14/2023)   Hunger Vital Sign    Worried About Running Out of Food in the Last Year: Never true    Ran Out of Food in the Last Year: Never true  Transportation Needs: No Transportation Needs (06/14/2023)   PRAPARE - Administrator, Civil Service (Medical): No    Lack of Transportation (Non-Medical): No  Physical Activity: Insufficiently Active (06/14/2023)   Exercise Vital Sign    Days of Exercise per Week: 2 days    Minutes of Exercise per Session: 30 min  Stress: No Stress Concern  Present (06/14/2023)   [employer] of Occupational Health - Occupational Stress Questionnaire    Feeling of Stress : Not at all  Social Connections: Moderately Integrated (06/14/2023)   Social Connection and Isolation Panel [NHANES]    Frequency of Communication with Friends and Family: More than three times a week    Frequency of Social Gatherings with Friends and Family:  More than three times a week    Attends Religious Services: More than 4 times per year    Active Member of Clubs or Organizations: Yes    Attends Engineer, structural: More than 4 times per year    Marital Status: Divorced    Tobacco Counseling Counseling given: Not Answered   Clinical Intake:  Pre-visit preparation completed: Yes  Pain : No/denies pain     BMI - recorded: 31.23 Nutritional Risks: None Diabetes: No  How often do you need to have someone help you when you read instructions, pamphlets, or other written materials from your doctor or pharmacy?: 1 - Never  Interpreter Needed?: No  Information entered by :: Theresa Mulligan LPN   Activities of Daily Living    06/14/2023    3:15 PM 06/12/2023    5:03 PM  In your present state of health, do you have any difficulty performing the following activities:  Hearing? 0 0  Vision? 0 0  Difficulty concentrating or making decisions? 0 0  Walking or climbing stairs? 0 0  Dressing or bathing? 0 0  Doing errands, shopping? 0 0  Preparing Food and eating ? N N  Using the Toilet? N N  In the past six months, have you accidently leaked urine? N N  Do you have problems with loss of bowel control? N N  Managing your Medications? N N  Managing your Finances? N N  Housekeeping or managing your Housekeeping? N N    Patient Care Team: Panosh, [name], MD as PCP - General Natalia Leatherwood, MD as Consulting Physician (Urology) Myrtie Neither Andreas Blower, MD as Consulting Physician (Gastroenterology) Ronne Binning Mardene Celeste, MD as Consulting Physician  (Urology)  Indicate any recent Medical Services you may have received from other than [facility] providers in the past year (date may be approximate).     Assessment:   This is a routine wellness examination for Melena.  Hearing/Vision screen Hearing Screening - Comments:: Denies hearing difficulties   Vision Screening - Comments:: Wears rx glasses - up to date with routine eye exams with  Dr Marthann Schiller   Goals Addressed               This Visit's Progress     Lose Weight (pt-stated)         Depression Screen    06/14/2023    3:14 PM 07/05/2022   11:05 AM 04/25/2022    3:31 PM 03/23/2022    8:37 AM 10/23/2021    4:00 PM 07/04/2021    2:29 PM 04/24/2021    3:37 PM  PHQ 2/9 Scores  PHQ - 2 Score 1 0 0 0 0 0 0  PHQ- 9 Score  1 0 0 0 0     Fall Risk    06/14/2023    3:16 PM 06/12/2023    5:03 PM 04/29/2023    2:05 PM 02/16/2023   12:52 PM 07/05/2022   11:04 AM  Fall Risk   Falls in the past year? 1 1 1  0 1  Number falls in past yr: 0 0 0  0  Injury with Fall? 0 0 0  1  Comment     sore muscle  Risk for fall due to : No Fall Risks  No Fall Risks  Other (Comment)  Follow up Falls prevention discussed  Falls evaluation completed  Falls evaluation completed    MEDICARE RISK AT HOME: Medicare Risk at Home Any stairs in or around the home?:  No If so, are there any without handrails?: No Home free of loose throw rugs in walkways, pet beds, electrical cords, etc?: Yes Adequate lighting in your home to reduce risk of falls?: Yes Life alert?: No Use of a cane, walker or w/c?: No Grab bars in the bathroom?: Yes Shower chair or bench in shower?: No Elevated toilet seat or a handicapped toilet?: Yes  TIMED UP AND GO:  Was the test performed?  Yes  Length of time to ambulate 10 feet: 10 sec Gait steady and fast without use of assistive device    Cognitive Function:        06/14/2023    3:17 PM 04/25/2022    3:36 PM 04/24/2021    3:42 PM  6CIT Screen  What Year? 0 points 0 points  0 points  What month? 0 points 0 points 0 points  What time? 0 points 0 points 0 points  Count back from 20 0 points 0 points 0 points  Months in reverse 0 points 0 points 0 points  Repeat phrase 0 points 0 points 0 points  Total Score 0 points 0 points 0 points    Immunizations Immunization History  Administered Date(s) Administered   Fluad Quad(high Dose 65+) 03/01/2020, 02/15/2021   Fluad Trivalent(High Dose 65+) 02/19/2023   Influenza,inj,Quad PF,6+ Mos 03/19/2019   PFIZER(Purple Top)SARS-COV-2 Vaccination 08/22/2019, 09/16/2019, 03/05/2020   Pfizer Covid-19 Vaccine Bivalent Booster 6yrs & up 03/20/2021   Pfizer(Comirnaty)Fall Seasonal Vaccine 12 years and older 03/09/2022, 04/03/2023   Pneumococcal Conjugate-13 06/01/2020   Pneumococcal Polysaccharide-23 07/04/2021   Respiratory Syncytial Virus Vaccine,Recomb Aduvanted(Arexvy) 07/02/2022   Td 05/21/2001   Tdap 05/13/2013   Zoster Recombinant(Shingrix) 03/26/2022, 07/02/2022    TDAP status: Due, Education has been provided regarding the importance of this vaccine. Advised may receive this vaccine at local pharmacy or Health Dept. Aware to provide a copy of the vaccination record if obtained from local pharmacy or Health Dept. Verbalized acceptance and understanding.  Flu Vaccine status: Up to date  Pneumococcal vaccine status: Up to date  Covid-19 vaccine status: Declined, Education has been provided regarding the importance of this vaccine but patient still declined. Advised may receive this vaccine at local pharmacy or Health Dept.or vaccine clinic. Aware to provide a copy of the vaccination record if obtained from local pharmacy or Health Dept. Verbalized acceptance and understanding.  Qualifies for Shingles Vaccine? Yes   Zostavax completed Yes   Shingrix Completed?: Yes  Screening Tests Health Maintenance  Topic Date Due   Colonoscopy  03/29/2023   DTaP/Tdap/Td (3 - Td or Tdap) 05/14/2023   COVID-19 Vaccine (7 -  2024-25 season) 05/29/2023   MAMMOGRAM  06/13/2024   Medicare Annual Wellness (AWV)  06/13/2024   Pneumonia Vaccine 22+ Years old  Completed   INFLUENZA VACCINE  Completed   DEXA SCAN  Completed   Hepatitis C Screening  Completed   Zoster Vaccines- Shingrix  Completed   HPV VACCINES  Aged Out    Health Maintenance  Health Maintenance Due  Topic Date Due   Colonoscopy  03/29/2023   DTaP/Tdap/Td (3 - Td or Tdap) 05/14/2023   COVID-19 Vaccine (7 - 2024-25 season) 05/29/2023    Colorectal cancer screening: Referral to GI placed Scheduled for 07/05/23. Pt aware the office will call re: appt.  Mammogram status: Ordered Schedule for 07/24/23. Pt provided with contact info and advised to call to schedule appt.   Bone Density status: Completed 08/15/21. Results reflect: Bone density results: OSTEOPOROSIS. Repeat every  years.     Additional Screening:  Hepatitis C Screening: does qualify; Completed 07/04/16  Vision Screening: Recommended annual ophthalmology exams for early detection of glaucoma and other disorders of the eye. Is the patient up to date with their annual eye exam?  Yes  Who is the provider or what is the name of the office in which the patient attends annual eye exams? Dr Marthann Schiller If pt is not established with a provider, would they like to be referred to a provider to establish care? No .   Dental Screening: Recommended annual dental exams for proper oral hygiene    Community Resource Referral / Chronic Care Management:  CRR required this visit?  No   CCM required this visit?  No     Plan:     I have personally reviewed and noted the following in the patient's chart:   Medical and social history Use of alcohol, tobacco or illicit drugs  Current medications and supplements including opioid prescriptions. Patient is not currently taking opioid prescriptions. Functional ability and status Nutritional status Physical activity Advanced directives List of  other physicians Hospitalizations, surgeries, and ER visits in previous 12 months Vitals Screenings to include cognitive, depression, and falls Referrals and appointments  In addition, I have reviewed and discussed with patient certain preventive protocols, quality metrics, and best practice recommendations. A written personalized care plan for preventive services as well as general preventive health recommendations were provided to patient.     Tillie Rung, LPN   1/61/0960   After Visit Summary: (In Person-Printed) AVS printed and given to the patient  Nurse Notes: None

## 2023-06-17 ENCOUNTER — Encounter: Payer: Self-pay | Admitting: Obstetrics and Gynecology

## 2023-06-18 ENCOUNTER — Encounter: Payer: Self-pay | Admitting: Obstetrics and Gynecology

## 2023-06-19 NOTE — Telephone Encounter (Signed)
Dr. Karma Greaser -would you like patient scheduled for OV to complete FRAX questionnaire with patient?

## 2023-06-20 NOTE — Telephone Encounter (Signed)
Pt seen by EB on 06/13/2023 for this concern. Encounter closed.

## 2023-06-20 NOTE — Telephone Encounter (Signed)
Per EB:  "March will be fine for timing  Dr. Karma Greaser"

## 2023-07-05 ENCOUNTER — Ambulatory Visit: Payer: Medicare PPO | Admitting: Gastroenterology

## 2023-07-05 ENCOUNTER — Encounter: Payer: Self-pay | Admitting: Gastroenterology

## 2023-07-05 VITALS — BP 126/54 | HR 66 | Temp 98.0°F | Resp 14 | Ht 64.0 in | Wt 181.0 lb

## 2023-07-05 DIAGNOSIS — K5730 Diverticulosis of large intestine without perforation or abscess without bleeding: Secondary | ICD-10-CM | POA: Diagnosis not present

## 2023-07-05 DIAGNOSIS — Z1211 Encounter for screening for malignant neoplasm of colon: Secondary | ICD-10-CM

## 2023-07-05 DIAGNOSIS — F32A Depression, unspecified: Secondary | ICD-10-CM | POA: Diagnosis not present

## 2023-07-05 DIAGNOSIS — Z800 Family history of malignant neoplasm of digestive organs: Secondary | ICD-10-CM | POA: Diagnosis not present

## 2023-07-05 DIAGNOSIS — E785 Hyperlipidemia, unspecified: Secondary | ICD-10-CM | POA: Diagnosis not present

## 2023-07-05 MED ORDER — SODIUM CHLORIDE 0.9 % IV SOLN
500.0000 mL | Freq: Once | INTRAVENOUS | Status: DC
Start: 2023-07-05 — End: 2023-07-05

## 2023-07-05 NOTE — Patient Instructions (Signed)
Repeat Colonoscopy in 5 years for screening purposes.  YOU HAD AN ENDOSCOPIC PROCEDURE TODAY AT THE [facility]:   Refer to the procedure report that was given to you for any specific questions about what was found during the examination.  If the procedure report does not answer your questions, please call your gastroenterologist to clarify.  If you requested that your care partner not be given the details of your procedure findings, then the procedure report has been included in a sealed envelope for you to review at your convenience later.  YOU SHOULD EXPECT: Some feelings of bloating in the abdomen. Passage of more gas than usual.  Walking can help get rid of the air that was put into your GI tract during the procedure and reduce the bloating. If you had a lower endoscopy (such as a colonoscopy or flexible sigmoidoscopy) you may notice spotting of blood in your stool or on the toilet paper. If you underwent a bowel prep for your procedure, you may not have a normal bowel movement for a few days.  Please Note:  You might notice some irritation and congestion in your nose or some drainage.  This is from the oxygen used during your procedure.  There is no need for concern and it should clear up in a day or so.  SYMPTOMS TO REPORT IMMEDIATELY:  Following lower endoscopy (colonoscopy or flexible sigmoidoscopy):  Excessive amounts of blood in the stool  Significant tenderness or worsening of abdominal pains  Swelling of the abdomen that is new, acute  Fever of 100F or higher  For urgent or emergent issues, a gastroenterologist can be reached at any hour by calling [phone]. Do not use MyChart messaging for urgent concerns.    DIET:  We do recommend a small meal at first, but then you may proceed to your regular diet.  Drink plenty of fluids but you should avoid alcoholic beverages for 24 hours.  ACTIVITY:  You should plan to take it easy for the rest of today and you should NOT  DRIVE or use heavy machinery until tomorrow (because of the sedation medicines used during the test).    FOLLOW UP: Our staff will call the number listed on your records the next business day following your procedure.  We will call around 7:15- 8:00 am to check on you and address any questions or concerns that you may have regarding the information given to you following your procedure. If we do not reach you, we will leave a message.     If any biopsies were taken you will be contacted by phone or by letter within the next 1-3 weeks.  Please call us at [phone] if you have not heard about the biopsies in 3 weeks.    SIGNATURES/CONFIDENTIALITY: You and/or your care partner have signed paperwork which will be entered into your electronic medical record.  These signatures attest to the fact that that the information above on your After Visit Summary has been reviewed and is understood.  Full responsibility of the confidentiality of this discharge information lies with you and/or your care-partner.

## 2023-07-05 NOTE — Progress Notes (Signed)
History and Physical:  This patient presents for endoscopic testing for: Encounter Diagnosis  Name Primary?   Family history of colon cancer in father Yes    Screening exam for Fam Hx CRC - last colonoscopy 03/2018 - no polyps Recent OV for RUQ musculoskeletal pain  Patient is otherwise without complaints or active issues today.   Past Medical History: Past Medical History:  Diagnosis Date   Arthritis    Bunion    left foot   Depression    stable on medication   Diverticulitis 2015   Diverticulitis of colon without hemorrhage 09/28/2013   Diverticulosis    Gallstones    GERD (gastroesophageal reflux disease)    Hammer toes of both feet    Hyperlipidemia    no meds needed   IBS (irritable bowel syndrome)    Lichen sclerosus    Meniscal injury    Tear   Onychomycosis of toenail    Osteopenia    Osteoporosis    PUD (peptic ulcer disease) 1980's   Renal cyst    4.5-6 cm stable single  seen by uro in past     Past Surgical History: Past Surgical History:  Procedure Laterality Date   CHOLECYSTECTOMY  1987   COLONOSCOPY     IRRIGATION AND DEBRIDEMENT SEBACEOUS CYST Right 3/ 2014   chest wall   KNEE ARTHROSCOPY WITH MENISCAL REPAIR Left 07/30/2022   Procedure: LEFT KNEE ARTHROSCOPY WITH MEDIAL MENISCAL ROOT REPAIR;  Surgeon: Huel Cote, MD;  Location: [facility];  Service: Orthopedics;  Laterality: Left;   UPPER GASTROINTESTINAL ENDOSCOPY     WISDOM TOOTH EXTRACTION  age 68 & 64   lower teeth done first and upper wisdom teeth last    Allergies: Allergies  Allergen Reactions   Fosamax [Alendronate Sodium] Other (See Comments)    Intense pain   Tylenol [Acetaminophen] Other (See Comments)    "Irritates my esophagus"    Outpatient Meds: Current Outpatient Medications  Medication Sig Dispense Refill   Calcium Carb-Cholecalciferol (CALCIUM 600+D3 PO) Take 1 tablet by mouth daily.     clobetasol ointment (TEMOVATE) 0.05 % Apply 1 Application  topically 2 (two) times daily. Apply as directed twice daily for up to 2 weeks, then change to 1-2 x a week. 60 g 1   estradiol (ESTRACE VAGINAL) 0.1 MG/GM vaginal cream Place 1 Applicatorful vaginally at bedtime. Rub a quarter size amount twice daily to affected area until next seen for punch biopsy 42.5 g 12   FLUoxetine (PROZAC) 10 MG tablet Take 5 mg by mouth daily.     FLUoxetine (PROZAC) 40 MG capsule Take 40 mg by mouth daily.     Multiple Vitamins-Minerals (CENTRUM ULTRA WOMENS PO) Take 1 tablet by mouth every other day.     omeprazole (PRILOSEC) 20 MG capsule Take by mouth.     Probiotic Product (PROBIOTIC PO) Take by mouth daily.     denosumab (PROLIA) 60 MG/ML SOSY injection Inject 60 mg into the skin every 6 (six) months.     fluticasone (FLONASE) 50 MCG/ACT nasal spray Place 1 spray into both nostrils daily. 16 mL 0   Current Facility-Administered Medications  Medication Dose Route Frequency Provider Last Rate Last Admin   0.9 %  sodium chloride infusion  500 mL Intravenous Once Danis, Starr Lake III, MD       Romosozumab-aqqg (EVENITY) 105 MG/1. injection 210 mg  210 mg Subcutaneous Once           ___________________________________________________________________ Objective  Exam:  BP (!) 140/79   Pulse 78   Temp 98 F (36.7 C) (Skin)   Ht 5\' 4"  (1.626 m)   Wt 181 lb (82.1 kg)   LMP 11/18/2004   SpO2 96%   BMI 31.07 kg/m   CV: regular , S1/S2 Resp: clear to auscultation bilaterally, normal RR and effort noted GI: soft, no tenderness, with active bowel sounds.   Assessment: Encounter Diagnosis  Name Primary?   Family history of colon cancer in father Yes     Plan: Colonoscopy   The benefits and risks of the planned procedure were described in detail with the patient or (when appropriate) their health care proxy.  Risks were outlined as including, but not limited to, bleeding, infection, perforation, adverse medication reaction leading to cardiac or  pulmonary decompensation, pancreatitis (if ERCP).  The limitation of incomplete mucosal visualization was also discussed.  No guarantees or warranties were given.  The patient is appropriate for an endoscopic procedure in the ambulatory setting.   - Amada Jupiter, MD

## 2023-07-05 NOTE — Op Note (Signed)
[facility] Patient Name: [name] [name] Procedure Date: 07/05/2023 1:32 PM MRN: [id number] Endoscopist: Sherilyn Cooter L. Myrtie Neither , MD, [id number] Age: 68 Referring MD:  Date of Birth: 07-28-1955 Gender: Female Account #: [id number] Procedure:                Colonoscopy Indications:              Colon cancer screening in patient at increased                            risk: Colorectal cancer in father                           no polyps in 2019 Medicines:                Monitored Anesthesia Care Procedure:                Pre-Anesthesia Assessment:                           - Prior to the procedure, a History and Physical                            was performed, and patient medications and                            allergies were reviewed. The patient's tolerance of                            previous anesthesia was also reviewed. The risks                            and benefits of the procedure and the sedation                            options and risks were discussed with the patient.                            All questions were answered, and informed consent                            was obtained. Prior Anticoagulants: The patient has                            taken no anticoagulant or antiplatelet agents. ASA                            Grade Assessment: II - A patient with mild systemic                            disease. After reviewing the risks and benefits,                            the patient was deemed in satisfactory condition to  undergo the procedure.                           After obtaining informed consent, the colonoscope                            was passed under direct vision. Throughout the                            procedure, the patient's blood pressure, pulse, and                            oxygen saturations were monitored continuously. The                            Olympus Scope SN [id number] was introduced through the                             anus and advanced to the the cecum, identified by                            appendiceal orifice and ileocecal valve. The                            colonoscopy was somewhat difficult due to a                            redundant colon. Successful completion of the                            procedure was aided by using manual pressure and                            straightening and shortening the scope to obtain                            bowel loop reduction. The patient tolerated the                            procedure well. The quality of the bowel                            preparation was excellent. The ileocecal valve,                            appendiceal orifice, and rectum were photographed. Scope In: 1:35:22 PM Scope Out: 1:55:48 PM Scope Withdrawal Time: 0 hours 14 minutes 41 seconds  Total Procedure Duration: 0 hours 20 minutes 26 seconds  Findings:                 The perianal and digital rectal examinations were                            normal.  Repeat examination of right colon under NBI                            performed.                           Multiple diverticula were found in the left colon.                           The exam was otherwise without abnormality on                            direct and retroflexion views. Complications:            No immediate complications. Estimated Blood Loss:     Estimated blood loss: none. Impression:               - Diverticulosis in the left colon.                           - The examination was otherwise normal on direct                            and retroflexion views.                           - No specimens collected. Recommendation:           - Patient has a contact number available for                            emergencies. The signs and symptoms of potential                            delayed complications were discussed with the                            patient.  Return to normal activities tomorrow.                            Written discharge instructions were provided to the                            patient.                           - Resume previous diet.                           - Repeat colonoscopy in 5 years for screening                            purposes. Dorlene Footman L. Myrtie Neither, MD 07/05/2023 2:01:36 PM This report has been signed electronically.

## 2023-07-05 NOTE — Progress Notes (Signed)
To pacu, VSS. Report to Rn.tb

## 2023-07-08 ENCOUNTER — Telehealth: Payer: Self-pay

## 2023-07-08 NOTE — Telephone Encounter (Signed)
 Follow up call to pt, lm for pt to call if having any difficulty with normal activities or eating and drinking.  Also to call if any other questions or concerns.

## 2023-07-10 ENCOUNTER — Encounter: Payer: Medicare PPO | Admitting: Internal Medicine

## 2023-07-15 ENCOUNTER — Other Ambulatory Visit (HOSPITAL_COMMUNITY)
Admission: RE | Admit: 2023-07-15 | Discharge: 2023-07-15 | Disposition: A | Discharge status: Home / Self Care | Payer: Medicare PPO | Source: Ambulatory Visit | Attending: Obstetrics and Gynecology | Admitting: Obstetrics and Gynecology

## 2023-07-15 ENCOUNTER — Ambulatory Visit: Payer: Medicare PPO | Admitting: Obstetrics and Gynecology

## 2023-07-15 VITALS — BP 118/64 | HR 76 | Wt 175.0 lb

## 2023-07-15 DIAGNOSIS — L280 Lichen simplex chronicus: Secondary | ICD-10-CM

## 2023-07-15 DIAGNOSIS — L900 Lichen sclerosus et atrophicus: Secondary | ICD-10-CM

## 2023-07-15 NOTE — Progress Notes (Signed)
   Acute Office Visit  Subjective:    Patient ID: [name] [name], female    DOB: 02-14-1956, 68 y.o.   MRN: [id number]   HPI 68 y.o. presents today for Procedure (Punch bx Patient is wondering when she can get her evenity injections started. ) .  Patient's last menstrual period was 11/18/2004.    Review of Systems     Objective:    OBGyn Exam  BP 118/64   Pulse 76   Wt 175 lb (79.4 kg)   LMP 11/18/2004   SpO2 100%   BMI 30.04 kg/m  Wt Readings from Last 3 Encounters:  07/15/23 175 lb (79.4 kg)  07/05/23 181 lb (82.1 kg)  06/14/23 184 lb 12.8 oz (83.8 kg)      Procedure: Punch biopsy Patient consented for the procedure with written consent.  Left inner introitus identified as area of pruritus up to clitoris. The area was cleaned with betadine.  1 cc of lidocaine with epi was injected subcutaneously.  A 3 mm punch biopsy was used and the biopsy was then lifted and cut with the scissors and sent to pathology.  Hemostasis was achieved with silver nitrate.  Patient tolerated the procedure well.  Post care instructions were discussed with patient.     Patient informed chaperone available to be present for breast and/or pelvic exam. Patient has requested no chaperone to be present. Patient has been advised what will be completed during breast and pelvic exam.   Assessment & Plan:  Pruritus Punch biopsy collected today.  To notify patient of the results. Post care instructions discussed.  Continue estrogen cream to area until heals and may return to clobetasol once it heals with flares. Clobetasol instructions: Apply to area with itching 3 times a day for a week then twice a day for 2 weeks then daily for 1 week then twice weekly for a week   Earley Favor

## 2023-07-15 NOTE — NC FL2 (Signed)
 [place] MEDICAID FL2 LEVEL OF CARE FORM     IDENTIFICATION  Patient Name: JENNETT TARBELL Birthdate: 1955/10/25 Sex: female Admission Date (Current Location): (Not on file)  [place] Number:      Facility and Address:         Provider Number:    Attending Physician Name and Address:  Earley Favor, MD  Relative Name and Phone Number:       Current Level of Care:   Recommended Level of Care:   Prior Approval Number:    Date Approved/Denied:   PASRR Number:    Discharge Plan:      Current Diagnoses: Patient Active Problem List   Diagnosis Date Noted   Acute medial meniscus tear of left knee 07/30/2022   Elevated alkaline phosphatase measurement 09/26/2014   Renal cyst, left 12/31/2013   Osteopenia 12/31/2013   Vitamin D deficiency 12/31/2013   Postmenopausal atrophic vaginitis 12/31/2013   Family history of colon cancer 10/02/2013   Wrist pain, right 05/13/2013   Hx of fall 05/13/2013   Hypercholesteremia 05/13/2013   Musculoskeletal leg pain 02/02/2013   Gait abnormality 02/02/2013   Chest discomfort 05/28/2011   Renal cyst 12/01/2010   Cough 09/27/2010   Neck pain 08/29/2010   Allergic rhinitis, seasonal 08/29/2010   Varicose veins of bilateral lower extremities with other complications 12/06/2008   Brachial neuritis or radiculitis 10/29/2008   HYPERLIPIDEMIA NEC/NOS 01/10/2007   DEPRESSION 01/08/2007   GERD 01/08/2007   Diverticulosis of colon 07/28/2002    Orientation RESPIRATION BLADDER Height & Weight            Weight: 175 lb (79.4 kg) Height:     BEHAVIORAL SYMPTOMS/MOOD NEUROLOGICAL BOWEL NUTRITION STATUS           AMBULATORY STATUS COMMUNICATION OF NEEDS Skin                               Personal Care Assistance Level of Assistance              Functional Limitations Info             SPECIAL CARE FACTORS FREQUENCY                       Contractures      Additional Factors Info                   Current Medications (07/15/2023):  This is the current hospital active medication list Current Outpatient Medications  Medication Sig Dispense Refill   Calcium Carb-Cholecalciferol (CALCIUM 600+D3 PO) Take 1 tablet by mouth daily.     clobetasol ointment (TEMOVATE) 0.05 % Apply 1 Application topically 2 (two) times daily. Apply as directed twice daily for up to 2 weeks, then change to 1-2 x a week. 60 g 1   denosumab (PROLIA) 60 MG/ML SOSY injection Inject 60 mg into the skin every 6 (six) months.     estradiol (ESTRACE VAGINAL) 0.1 MG/GM vaginal cream Place 1 Applicatorful vaginally at bedtime. Rub a quarter size amount twice daily to affected area until next seen for punch biopsy 42.5 g 12   FLUoxetine (PROZAC) 10 MG tablet Take 5 mg by mouth daily.     FLUoxetine (PROZAC) 40 MG capsule Take 40 mg by mouth daily.     fluticasone (FLONASE) 50 MCG/ACT nasal spray Place 1 spray into both nostrils daily. 16 mL  0   Multiple Vitamins-Minerals (CENTRUM ULTRA WOMENS PO) Take 1 tablet by mouth every other day.     omeprazole (PRILOSEC) 20 MG capsule Take by mouth.     Probiotic Product (PROBIOTIC PO) Take by mouth daily.     Current Facility-Administered Medications  Medication Dose Route Frequency Provider Last Rate Last Admin   Romosozumab-aqqg (EVENITY) 105 MG/1. injection 210 mg  210 mg Subcutaneous Once          Discharge Medications: Please see discharge summary for a list of discharge medications.  Relevant Imaging Results:  Relevant Lab Results:   Additional Information    Blima Ledger, CMA

## 2023-07-17 ENCOUNTER — Encounter: Payer: Self-pay | Admitting: Obstetrics and Gynecology

## 2023-07-17 LAB — SURGICAL PATHOLOGY

## 2023-07-18 ENCOUNTER — Telehealth: Payer: Self-pay | Admitting: *Deleted

## 2023-07-18 NOTE — Telephone Encounter (Signed)
 [name] Favor, MD  Jetta Lout, RN Wants to get her injections here.   She is due for prolia this month  I think she should get the prolia and see if we can get the PA for evenity for a year She has been on prolia with not much improvement and still in fracture risk with positive FRAX score.  2022 -2.7 2024 -2.4 with frax of 29% and hip fracture of 5.9%  She has been on prolia for 2 years. She was at infusion center in [place] and it has been a hassle for her to get her injections.  She did blood work in January. I will put her frax score on your desk  Thank you Irving Burton!!!

## 2023-07-18 NOTE — Telephone Encounter (Signed)
 See telephone encounter dated 05/28/23.   Encounter closed.

## 2023-07-18 NOTE — Telephone Encounter (Signed)
 Call to [facility], spoke with Lorin Picket, representative, who states that PA that was submitted on 06/06/23 was aborted on 06/21/23, no reason given. Scott advised would fax PA form to our office to be filled out and submitted. Reference #: [id number].

## 2023-07-18 NOTE — Telephone Encounter (Signed)
-----   Message from Earley Favor sent at 07/15/2023 11:57 AM EST ----- Wants to get her injections here.   She is due for prolia this month  I think she should get the prolia and see if we can get the PA for evenity for a year She has been on prolia with not much improvement and still in fracture risk with positive FRAX score.  2022 -2.7 2024 -2.4 with frax of 29% and hip fracture of 5.9%  She has been on prolia for 2 years. She was at infusion center in [place] and it has been a hassle for her to get her injections.  She did blood work in January.  I will put her frax score on your desk  Thank you Irving Burton!!!

## 2023-07-19 ENCOUNTER — Encounter: Payer: Self-pay | Admitting: Obstetrics and Gynecology

## 2023-07-19 DIAGNOSIS — Z1231 Encounter for screening mammogram for malignant neoplasm of breast: Secondary | ICD-10-CM

## 2023-07-22 ENCOUNTER — Other Ambulatory Visit: Payer: Self-pay | Admitting: Obstetrics and Gynecology

## 2023-07-22 DIAGNOSIS — Z1231 Encounter for screening mammogram for malignant neoplasm of breast: Secondary | ICD-10-CM

## 2023-07-22 NOTE — Telephone Encounter (Signed)
 PA for [facility] faxed to [facility] at [phone]. Will await response.

## 2023-07-22 NOTE — Telephone Encounter (Signed)
 PA taken to Dr. Bonney Roussel desk to review and sign. Will fax with signature.

## 2023-07-24 ENCOUNTER — Ambulatory Visit: Payer: Medicare PPO

## 2023-07-24 DIAGNOSIS — Z1231 Encounter for screening mammogram for malignant neoplasm of breast: Secondary | ICD-10-CM

## 2023-07-29 ENCOUNTER — Encounter: Payer: Self-pay | Admitting: Obstetrics and Gynecology

## 2023-07-30 ENCOUNTER — Other Ambulatory Visit: Payer: Self-pay | Admitting: Obstetrics and Gynecology

## 2023-07-30 DIAGNOSIS — R928 Other abnormal and inconclusive findings on diagnostic imaging of breast: Secondary | ICD-10-CM

## 2023-07-30 NOTE — Telephone Encounter (Signed)
 Call to patient. Advised PA for Evenity had been approved. Summary of benefits for Evenity 20% coinsurance + $35 admin fee for estimated total OOP of $490/month discussed with patient. Patient states she cannot afford that, so would like to continue with prolia injections. States she had been receiving through [facility] in [place]. States she last received in 12/2022, but would like to continue in our office. Advised would update Dr. Karma Greaser. Patient agreeable and will contact [facility] to transfer records to our office.   Routing to provider for review. Okay to submit insurance to [employer] for prolia benefits to begin injections at our office?

## 2023-07-30 NOTE — Telephone Encounter (Signed)
Insurance information submitted to [employer] portal. Will await summary of benefits for prolia.    

## 2023-08-06 NOTE — Telephone Encounter (Signed)
 PA submitted for Prolia via covermymeds.Key: [id number]. Will await response.

## 2023-08-09 ENCOUNTER — Ambulatory Visit
Admission: RE | Admit: 2023-08-09 | Discharge: 2023-08-09 | Disposition: A | Discharge status: Home / Self Care | Source: Ambulatory Visit | Attending: Obstetrics and Gynecology | Admitting: Obstetrics and Gynecology

## 2023-08-09 ENCOUNTER — Other Ambulatory Visit: Payer: Self-pay | Admitting: Obstetrics and Gynecology

## 2023-08-09 DIAGNOSIS — R928 Other abnormal and inconclusive findings on diagnostic imaging of breast: Secondary | ICD-10-CM | POA: Diagnosis not present

## 2023-08-12 ENCOUNTER — Encounter: Payer: Self-pay | Admitting: Obstetrics and Gynecology

## 2023-08-14 DIAGNOSIS — F331 Major depressive disorder, recurrent, moderate: Secondary | ICD-10-CM | POA: Diagnosis not present

## 2023-08-14 DIAGNOSIS — F411 Generalized anxiety disorder: Secondary | ICD-10-CM | POA: Diagnosis not present

## 2023-08-15 DIAGNOSIS — F3341 Major depressive disorder, recurrent, in partial remission: Secondary | ICD-10-CM | POA: Diagnosis not present

## 2023-08-20 NOTE — Progress Notes (Unsigned)
 No chief complaint on file.   HPI: Patient  [name] [name]  68 y.o. comes in today for Preventive Health Care visit   Health Maintenance  Topic Date Due   DTaP/Tdap/Td (3 - Td or Tdap) 05/14/2023   COVID-19 Vaccine (7 - Pfizer risk 2024-25 season) 10/01/2023   INFLUENZA VACCINE  12/20/2023   Medicare Annual Wellness (AWV)  06/13/2024   MAMMOGRAM  07/23/2025   Colonoscopy  07/04/2028   Pneumonia Vaccine 45+ Years old  Completed   DEXA SCAN  Completed   Hepatitis C Screening  Completed   Zoster Vaccines- Shingrix  Completed   HPV VACCINES  Aged Out   Health Maintenance Review LIFESTYLE:  Exercise:   Tobacco/ETS: Alcohol:  Sugar beverages: Sleep: Drug use: no HH of  Work:    ROS:  GEN/ HEENT: No fever, significant weight changes sweats headaches vision problems hearing changes, CV/ PULM; No chest pain shortness of breath cough, syncope,edema  change in exercise tolerance. GI /GU: No adominal pain, vomiting, change in bowel habits. No blood in the stool. No significant GU symptoms. SKIN/HEME: ,no acute skin rashes suspicious lesions or bleeding. No lymphadenopathy, nodules, masses.  NEURO/ PSYCH:  No neurologic signs such as weakness numbness. No depression anxiety. IMM/ Allergy: No unusual infections.  Allergy .   REST of 12 system review negative except as per HPI   Past Medical History:  Diagnosis Date   Arthritis    Bunion    left foot   Depression    stable on medication   Diverticulitis 2015   Diverticulitis of colon without hemorrhage 09/28/2013   Diverticulosis    Gallstones    GERD (gastroesophageal reflux disease)    Hammer toes of both feet    Hyperlipidemia    no meds needed   IBS (irritable bowel syndrome)    Lichen sclerosus    Meniscal injury    Tear   Onychomycosis of toenail    Osteopenia    Osteoporosis    PUD (peptic ulcer disease) 1980's   Renal cyst    4.5-6 cm stable single  seen by uro in past    Past Surgical History:   Procedure Laterality Date   CHOLECYSTECTOMY  1987   COLONOSCOPY     IRRIGATION AND DEBRIDEMENT SEBACEOUS CYST Right 3/ 2014   chest wall   KNEE ARTHROSCOPY WITH MENISCAL REPAIR Left 07/30/2022   Procedure: LEFT KNEE ARTHROSCOPY WITH MEDIAL MENISCAL ROOT REPAIR;  Surgeon: Huel Cote, MD;  Location: [facility];  Service: Orthopedics;  Laterality: Left;   UPPER GASTROINTESTINAL ENDOSCOPY     WISDOM TOOTH EXTRACTION  age 60 & 9   lower teeth done first and upper wisdom teeth last    Family History  Problem Relation Age of Onset   Osteoarthritis Mother    COPD Mother    Pneumonia Mother    Osteoporosis Mother    Rheum arthritis Father    Pneumonia Father        deceased   Colon cancer Father 23   Heart attack Father    Breast cancer Sister 68   Colon polyps Sister    Cancer Maternal Grandmother        ? GI   Heart failure Maternal Grandfather    Breast cancer Paternal Grandmother 29   Heart failure Paternal Grandfather    Esophageal cancer Neg Hx    Rectal cancer Neg Hx    Stomach cancer Neg Hx     Social History  Socioeconomic History   Marital status: Divorced    Spouse name: Not on file   Number of children: 0   Years of education: Not on file   Highest education level: Master's degree (e.g., MA, MS, MEng, MEd, MSW, MBA)  Occupational History   Occupation: TEACHER    Employer: [employer]  Tobacco Use   Smoking status: Never   Smokeless tobacco: Never  Vaping Use   Vaping status: Never Used  Substance and Sexual Activity   Alcohol use: No   Drug use: No    Comment: >16, >5   Sexual activity: Not Currently    Partners: Male    Birth control/protection: Post-menopausal  Other Topics Concern   Not on file  Social History Narrative   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   Non smoker    Hh of 1 ... 2 cats    Single   G0P0   Mom is now in assisted living and she has moved into her old residence   Social  Drivers of Corporate investment banker Strain: Low Risk  (08/18/2023)   Overall Financial Resource Strain (CARDIA)    Difficulty of Paying Living Expenses: Not hard at all  Food Insecurity: No Food Insecurity (08/18/2023)   Hunger Vital Sign    Worried About Running Out of Food in the Last Year: Never true    Ran Out of Food in the Last Year: Never true  Transportation Needs: No Transportation Needs (08/18/2023)   PRAPARE - Administrator, Civil Service (Medical): No    Lack of Transportation (Non-Medical): No  Physical Activity: Insufficiently Active (08/18/2023)   Exercise Vital Sign    Days of Exercise per Week: 2 days    Minutes of Exercise per Session: 30 min  Stress: No Stress Concern Present (08/18/2023)   [employer] of Occupational Health - Occupational Stress Questionnaire    Feeling of Stress : Only a little  Social Connections: Moderately Integrated (08/18/2023)   Social Connection and Isolation Panel [NHANES]    Frequency of Communication with Friends and Family: More than three times a week    Frequency of Social Gatherings with Friends and Family: Twice a week    Attends Religious Services: More than 4 times per year    Active Member of [employer] or Organizations: Yes    Attends Engineer, structural: More than 4 times per year    Marital Status: Divorced    Outpatient Medications Prior to Visit  Medication Sig Dispense Refill   Calcium Carb-Cholecalciferol (CALCIUM 600+D3 PO) Take 1 tablet by mouth daily.     clobetasol ointment (TEMOVATE) 0.05 % Apply 1 Application topically 2 (two) times daily. Apply as directed twice daily for up to 2 weeks, then change to 1-2 x a week. 60 g 1   denosumab (PROLIA) 60 MG/ML SOSY injection Inject 60 mg into the skin every 6 (six) months.     estradiol (ESTRACE VAGINAL) 0.1 MG/GM vaginal cream Place 1 Applicatorful vaginally at bedtime. Rub a quarter size amount twice daily to affected area until next seen for punch  biopsy 42.5 g 12   FLUoxetine (PROZAC) 10 MG tablet Take 5 mg by mouth daily.     FLUoxetine (PROZAC) 40 MG capsule Take 40 mg by mouth daily.     fluticasone (FLONASE) 50 MCG/ACT nasal spray Place 1 spray into both nostrils daily. 16 mL 0   Multiple Vitamins-Minerals (CENTRUM ULTRA WOMENS PO) Take 1 tablet  by mouth every other day.     omeprazole (PRILOSEC) 20 MG capsule Take by mouth.     Probiotic Product (PROBIOTIC PO) Take by mouth daily.     Facility-Administered Medications Prior to Visit  Medication Dose Route Frequency Provider Last Rate Last Admin   Romosozumab-aqqg (EVENITY) 105 MG/1. injection 210 mg  210 mg Subcutaneous Once          EXAM:  LMP 11/18/2004   There is no height or weight on file to calculate BMI. Wt Readings from Last 3 Encounters:  07/15/23 175 lb (79.4 kg)  07/05/23 181 lb (82.1 kg)  06/14/23 184 lb 12.8 oz (83.8 kg)    Physical Exam: Vital signs reviewed JXB:JYNW is a well-developed well-nourished alert cooperative    who appearsr stated age in no acute distress.  HEENT: normocephalic atraumatic , Eyes: PERRL EOM's full, conjunctiva clear, Nares: paten,t no deformity discharge or tenderness., Ears: no deformity EAC's clear TMs with normal landmarks. Mouth: clear OP, no lesions, edema.  Moist mucous membranes. Dentition in adequate repair. NECK: supple without masses, thyromegaly or bruits. CHEST/PULM:  Clear to auscultation and percussion breath sounds equal no wheeze , rales or rhonchi. No chest wall deformities or tenderness. Breast: normal by inspection . No dimpling, discharge, masses, tenderness or discharge . CV: PMI is nondisplaced, S1 S2 no gallops, murmurs, rubs. Peripheral pulses are full without delay.No JVD .  ABDOMEN: Bowel sounds normal nontender  No guard or rebound, no hepato splenomegal no CVA tenderness.  No hernia. Extremtities:  No clubbing cyanosis or edema, no acute joint swelling or redness no focal atrophy NEURO:  Oriented  x3, cranial nerves 3-12 appear to be intact, no obvious focal weakness,gait within normal limits no abnormal reflexes or asymmetrical SKIN: No acute rashes normal turgor, color, no bruising or petechiae. PSYCH: Oriented, good eye contact, no obvious depression anxiety, cognition and judgment appear normal. LN: no cervical axillary inguinal adenopathy  Lab Results  Component Value Date   WBC 4.8 07/05/2022   HGB 13.5 07/05/2022   HCT 40.7 07/05/2022   PLT 277.0 07/05/2022   GLUCOSE 80 02/19/2023   CHOL 201 (H) 07/05/2022   TRIG 74.0 07/05/2022   HDL 75.40 07/05/2022   LDLDIRECT 132.7 03/24/2013   LDLCALC 111 (H) 07/05/2022   ALT 15 02/19/2023   AST 20 02/19/2023   NA 138 02/19/2023   K 4.2 02/19/2023   CL 100 02/19/2023   CREATININE 0.75 02/19/2023   BUN 15 02/19/2023   CO2 30 02/19/2023   TSH 1.56 07/05/2022   HGBA1C 5.9 06/17/2020    BP Readings from Last 3 Encounters:  07/15/23 118/64  07/05/23 (!) 126/54  06/14/23 118/80    Lab rupdate  reviewed with patient   ASSESSMENT AND PLAN:  Discussed the following assessment and plan:    ICD-10-CM   1. Visit for preventive health examination  Z00.00      No follow-ups on file.  Patient Care Team: Guilianna Mckoy, [name], MD as PCP - General Natalia Leatherwood, MD as Consulting Physician (Urology) Myrtie Neither Andreas Blower, MD as Consulting Physician (Gastroenterology) Ronne Binning Mardene Celeste, MD as Consulting Physician (Urology) There are no Patient Instructions on file for this visit.  [name]. Tremain Rucinski M.D.

## 2023-08-21 ENCOUNTER — Ambulatory Visit (INDEPENDENT_AMBULATORY_CARE_PROVIDER_SITE_OTHER): Payer: Medicare PPO | Admitting: Internal Medicine

## 2023-08-21 ENCOUNTER — Encounter: Payer: Self-pay | Admitting: Internal Medicine

## 2023-08-21 VITALS — BP 138/86 | HR 76 | Temp 98.0°F | Ht 64.5 in | Wt 179.0 lb

## 2023-08-21 DIAGNOSIS — Z79899 Other long term (current) drug therapy: Secondary | ICD-10-CM | POA: Diagnosis not present

## 2023-08-21 DIAGNOSIS — E559 Vitamin D deficiency, unspecified: Secondary | ICD-10-CM | POA: Diagnosis not present

## 2023-08-21 DIAGNOSIS — M810 Age-related osteoporosis without current pathological fracture: Secondary | ICD-10-CM | POA: Diagnosis not present

## 2023-08-21 DIAGNOSIS — K219 Gastro-esophageal reflux disease without esophagitis: Secondary | ICD-10-CM

## 2023-08-21 DIAGNOSIS — Z0000 Encounter for general adult medical examination without abnormal findings: Secondary | ICD-10-CM | POA: Diagnosis not present

## 2023-08-21 LAB — BASIC METABOLIC PANEL WITH GFR
BUN: 18 mg/dL (ref 6–23)
CO2: 28 meq/L (ref 19–32)
Calcium: 9.6 mg/dL (ref 8.4–10.5)
Chloride: 100 meq/L (ref 96–112)
Creatinine, Ser: 0.75 mg/dL (ref 0.40–1.20)
GFR: 81.69 mL/min (ref >60.00)
Glucose, Bld: 102 mg/dL — ABNORMAL HIGH (ref 70–99)
Potassium: 4.8 meq/L (ref 3.5–5.1)
Sodium: 138 meq/L (ref 135–145)

## 2023-08-21 LAB — CBC WITH DIFFERENTIAL/PLATELET
Basophils Absolute: 0.1 10*3/uL (ref 0.0–0.1)
Basophils Relative: 1.4 % (ref 0.0–3.0)
Eosinophils Absolute: 0.1 10*3/uL (ref 0.0–0.7)
Eosinophils Relative: 2.4 % (ref 0.0–5.0)
HCT: 41.9 % (ref 36.0–46.0)
Hemoglobin: 13.7 g/dL (ref 12.0–15.0)
Lymphocytes Relative: 27.6 % (ref 12.0–46.0)
Lymphs Abs: 1.4 10*3/uL (ref 0.7–4.0)
MCHC: 32.8 g/dL (ref 30.0–36.0)
MCV: 91.3 fl (ref 78.0–100.0)
Monocytes Absolute: 0.5 10*3/uL (ref 0.1–1.0)
Monocytes Relative: 10.3 % (ref 3.0–12.0)
Neutro Abs: 2.9 10*3/uL (ref 1.4–7.7)
Neutrophils Relative %: 58.3 % (ref 43.0–77.0)
Platelets: 250 10*3/uL (ref 150.0–400.0)
RBC: 4.59 Mil/uL (ref 3.87–5.11)
RDW: 13.7 % (ref 11.5–15.5)
WBC: 5 10*3/uL (ref 4.0–10.5)

## 2023-08-21 LAB — LIPID PANEL
Cholesterol: 229 mg/dL — ABNORMAL HIGH (ref 0–200)
HDL: 83.1 mg/dL (ref >39.00)
LDL Cholesterol: 130 mg/dL — ABNORMAL HIGH (ref 0–99)
NonHDL: 146.27
Total CHOL/HDL Ratio: 3
Triglycerides: 83 mg/dL (ref 0.0–149.0)
VLDL: 16.6 mg/dL (ref 0.0–40.0)

## 2023-08-21 LAB — HEPATIC FUNCTION PANEL
ALT: 17 U/L (ref 0–35)
AST: 22 U/L (ref 0–37)
Albumin: 4.5 g/dL (ref 3.5–5.2)
Alkaline Phosphatase: 91 U/L (ref 39–117)
Bilirubin, Direct: 0.1 mg/dL (ref 0.0–0.3)
Total Bilirubin: 0.5 mg/dL (ref 0.2–1.2)
Total Protein: 7.6 g/dL (ref 6.0–8.3)

## 2023-08-21 LAB — VITAMIN D 25 HYDROXY (VIT D DEFICIENCY, FRACTURES): VITD: 43.39 ng/mL (ref 30.00–100.00)

## 2023-08-21 LAB — TSH: TSH: 1.56 u[IU]/mL (ref 0.35–5.50)

## 2023-08-21 LAB — HEMOGLOBIN A1C: Hgb A1c MFr Bld: 5.9 % (ref 4.6–6.5)

## 2023-08-21 MED ORDER — OMEPRAZOLE 20 MG PO CPDR: 20.0000 mg | DELAYED_RELEASE_CAPSULE | Freq: Every day | ORAL | 5 refills | Status: DC

## 2023-08-21 NOTE — Patient Instructions (Addendum)
 Good to see  you today . Lab today  will have chemistry and vit d level . Optimize muscle exercising for bone health Sent in omeprazole to your pharmacy .  Can get tdap at pharmacy when convenient  last on was 12 2014

## 2023-08-22 ENCOUNTER — Encounter: Payer: Self-pay | Admitting: Internal Medicine

## 2023-08-22 NOTE — Progress Notes (Signed)
 Results  stable or in range  except cholesterol up slightly  vit D is in mid normal range . A1c  not in diabetic range   but slightly up.   Attention to lifestyle intervention healthy eating and exercise . Should help this

## 2023-09-02 ENCOUNTER — Encounter: Payer: Self-pay | Admitting: Obstetrics and Gynecology

## 2023-09-02 ENCOUNTER — Ambulatory Visit: Payer: Medicare PPO | Admitting: Obstetrics and Gynecology

## 2023-09-02 VITALS — BP 128/74 | Ht 64.5 in | Wt 180.4 lb

## 2023-09-02 DIAGNOSIS — M6289 Other specified disorders of muscle: Secondary | ICD-10-CM

## 2023-09-02 DIAGNOSIS — L900 Lichen sclerosus et atrophicus: Secondary | ICD-10-CM | POA: Diagnosis not present

## 2023-09-02 DIAGNOSIS — N952 Postmenopausal atrophic vaginitis: Secondary | ICD-10-CM | POA: Diagnosis not present

## 2023-09-02 DIAGNOSIS — R351 Nocturia: Secondary | ICD-10-CM | POA: Diagnosis not present

## 2023-09-02 LAB — POCT URINALYSIS DIP (CLINITEK)
Bilirubin, UA: NEGATIVE
Blood, UA: NEGATIVE
Glucose, UA: NEGATIVE mg/dL
Ketones, POC UA: NEGATIVE mg/dL
Leukocytes, UA: NEGATIVE
Nitrite, UA: NEGATIVE
POC PROTEIN,UA: NEGATIVE
Spec Grav, UA: 1.02 (ref 1.010–1.025)
Urobilinogen, UA: 0.2 U/dL
pH, UA: 5.5 (ref 5.0–8.0)

## 2023-09-02 MED ORDER — CLOBETASOL PROPIONATE 0.05 % EX OINT: 1.0000 | TOPICAL_OINTMENT | Freq: Two times a day (BID) | CUTANEOUS | 2 refills | Status: AC

## 2023-09-02 NOTE — Progress Notes (Signed)
 [facility] New Patient Evaluation and Consultation  Referring Provider: Madelin Headings, MD PCP: Madelin Headings, MD Date of Service: 09/02/2023  SUBJECTIVE Chief Complaint: New Patient (Initial Visit) (Referral Dr Karma Greaser Nocturia - gets up at night once or twice - if drinks diet coke (half a glass) will have to get up more. Only leaks with full bladder - urgency only upon waking in the am - feels like empties bladder completely)  History of Present Illness: [name] [name] is a 68 y.o. White or Caucasian female seen in consultation at the request of Dr. Fabian Sharp for evaluation of urine leaking at night.    Review of records significant for: Recently diagnosed with Lichen's sclerosus.   Urinary Symptoms: Leaks urine with while asleep Leaks none during the day and none at night Pad use: None during the day Patient is bothered by UI symptoms.  Day time voids 6-8.  Nocturia: 1-2 times per night to void. Voiding dysfunction:  empties bladder well.  Patient does not use a catheter to empty bladder.  When urinating, patient feels dribbling after finishing Drinks: 2 cups coffee AM, 1 cup coffee PM, 16oz Unsweetened ice tea with dinner, diet coke, water, stops at 10pm, sleeps around 12am per day  UTIs: 0 UTI's in the last year.   Denies history of blood in urine, kidney or bladder stones, pyelonephritis, bladder cancer, and kidney cancer No results found for the last 90 days.   Pelvic Organ Prolapse Symptoms:                  Patient Denies a feeling of a bulge the vaginal area.   Bowel Symptom: Bowel movements: 1-2 time(s) per day Stool consistency: soft  Straining: no.  Splinting: no.  Incomplete evacuation: no.  Patient Denies accidental bowel leakage / fecal incontinence Bowel regimen: fiber Last colonoscopy: Date 2020, Results Clear [facility] Colonoscopy          Upcoming     Colonoscopy (Every 5 Years) Next due on 07/04/2028    07/05/2023  COLONOSCOPY   Only  the first 1 history entries have been loaded, but more history exists.                Sexual Function Sexually active: no.  Sexual orientation: Straight Pain with sex: No  Pelvic Pain Denies pelvic pain    Past Medical History:  Past Medical History:  Diagnosis Date   Arthritis    Bunion    left foot   Depression    stable on medication   Diverticulitis 2015   Diverticulitis of colon without hemorrhage 09/28/2013   Diverticulosis    Gallstones    GERD (gastroesophageal reflux disease)    Hammer toes of both feet    Hyperlipidemia    no meds needed   IBS (irritable bowel syndrome)    Lichen sclerosus    Meniscal injury    Tear   Onychomycosis of toenail    Osteopenia    Osteoporosis    PUD (peptic ulcer disease) 1980's   Renal cyst    4.5-6 cm stable single  seen by uro in past     Past Surgical History:   Past Surgical History:  Procedure Laterality Date   CHOLECYSTECTOMY  1987   COLONOSCOPY     IRRIGATION AND DEBRIDEMENT SEBACEOUS CYST Right 3/ 2014   chest wall   KNEE ARTHROSCOPY WITH MENISCAL REPAIR Left 07/30/2022   Procedure: LEFT KNEE ARTHROSCOPY WITH MEDIAL MENISCAL ROOT REPAIR;  Surgeon: Wilhelmenia Harada, MD;  Location: [facility];  Service: Orthopedics;  Laterality: Left;   UPPER GASTROINTESTINAL ENDOSCOPY     WISDOM TOOTH EXTRACTION  age 25 & 1   lower teeth done first and upper wisdom teeth last     Past OB/GYN History:  G0P0000 Menopausal: Yes, at age 60 Contraception: NA. Last pap smear was May 2024.  Any history of abnormal pap smears: no. HM PAP   This patient has no relevant Health Maintenance data.     Medications: Patient has a current medication list which includes the following prescription(s): calcium carb-cholecalciferol, denosumab, estradiol, fluoxetine, fluoxetine, fluticasone, multiple vitamins-minerals, omeprazole, probiotic product, and clobetasol ointment, and the following Facility-Administered  Medications: romosozumab-aqqg.   Allergies: Patient is allergic to fosamax [alendronate sodium] and tylenol [acetaminophen].   Social History:  Social History   Tobacco Use   Smoking status: Never   Smokeless tobacco: Never  Vaping Use   Vaping status: Never Used  Substance Use Topics   Alcohol use: No   Drug use: No    Comment: >16, >5    Relationship status: divorced Patient lives alone.   Patient is not employed. Regular exercise: Yes: Walking and stationary bike History of abuse: No  Family History:   Family History  Problem Relation Age of Onset   Osteoarthritis Mother    COPD Mother    Pneumonia Mother    Osteoporosis Mother    Rheum arthritis Father    Pneumonia Father        deceased   Colon cancer Father 60   Heart attack Father    Breast cancer Sister 75   Colon polyps Sister    Cancer Maternal Grandmother        ? GI   Heart failure Maternal Grandfather    Breast cancer Paternal Grandmother 49   Heart failure Paternal Grandfather    Esophageal cancer Neg Hx    Rectal cancer Neg Hx    Stomach cancer Neg Hx      Review of Systems: Review of Systems  Constitutional:  Negative for chills and fever.  Respiratory:  Negative for cough and shortness of breath.   Cardiovascular:  Negative for chest pain and palpitations.  Gastrointestinal:  Negative for abdominal pain, blood in stool, constipation and diarrhea.  Skin:  Negative for rash.  Neurological:  Negative for weakness.  Endo/Heme/Allergies:  Bruises/bleeds easily.  Psychiatric/Behavioral:  Negative for depression and suicidal ideas.      OBJECTIVE Physical Exam: Vitals:   09/02/23 1017  BP: 128/74  Weight: 180 lb 6.4 oz (81.8 kg)  Height: 5' 4.5" (1.638 m)    Physical Exam Constitutional:      Appearance: Normal appearance.  Pulmonary:     Effort: Pulmonary effort is normal.  Abdominal:     Palpations: Abdomen is soft.  Neurological:     General: No focal deficit present.      Mental Status: She is alert and oriented to person, place, and time.  Psychiatric:        Mood and Affect: Mood normal.        Behavior: Behavior normal. Behavior is cooperative.        Thought Content: Thought content normal.      GU / Detailed Urogynecologic Evaluation:  Pelvic Exam: External exam reveals irritated vulvar skin with scaly/red appearance; Bartholin's and Skene's glands normal in appearance; urethral meatus normal in appearance, no urethral masses or discharge.   CST: positive for a very small  amount of leakage   Speculum exam reveals normal vaginal mucosa with atrophy. Cervix normal appearance. Uterus normal single, nontender. Adnexa normal adnexa.     With apex supported, anterior compartment defect was reduced  Pelvic floor strength II/V  Pelvic floor musculature: Right levator non-tender, Right obturator tender, Left levator non-tender, Left obturator non-tender  POP-Q:   POP-Q  -2                                            Aa   -2                                           Ba  -4                                              C   2                                            Gh  5                                            Pb  7                                            tvl   -3                                            Ap  -3                                            Bp  -5.5                                              D      Rectal Exam:  Normal external exam  Post-Void Residual (PVR) by Bladder Scan: In order to evaluate bladder emptying, we discussed obtaining a postvoid residual and patient agreed to this procedure.  Procedure: The ultrasound unit was placed on the patient's abdomen in the suprapubic region after the patient had voided.    Post Void Residual - 09/02/23 1025       Post Void Residual   Post Void Residual 2 mL              Laboratory Results: Lab Results  Component Value Date   COLORU yellow 09/02/2023    CLARITYU clear 09/02/2023   GLUCOSEUR negative 09/02/2023  BILIRUBINUR negative 09/02/2023   KETONESU Negative 11/29/2017   SPECGRAV 1.020 09/02/2023   RBCUR negative 09/02/2023   PHUR 5.5 09/02/2023   PROTEINUR NEGATIVE 06/13/2023   UROBILINOGEN 0.2 09/02/2023   LEUKOCYTESUR Negative 09/02/2023    Lab Results  Component Value Date   CREATININE 0.75 08/21/2023   CREATININE 0.75 02/19/2023   CREATININE 0.85 07/05/2022    Lab Results  Component Value Date   HGBA1C 5.9 08/21/2023    Lab Results  Component Value Date   HGB 13.7 08/21/2023     ASSESSMENT AND PLAN Ms. Weimer is a 68 y.o. with:  1. Nocturia   2. Lichen sclerosus   3. Vaginal atrophy   4. Pelvic floor dysfunction    We discussed the symptoms of overactive bladder (OAB), which include urinary urgency, urinary frequency, nocturia, with or without urge incontinence.  While we do not know the exact etiology of OAB, several treatment options exist. We discussed management including behavioral therapy (decreasing bladder irritants, urge suppression strategies, timed voids, bladder retraining), physical therapy, and medication. Patient has a very small amount of leakage only at nighttime without significant urgency. She wakes up to wet underwear. It is unclear if this is more SUI related or OAB related. Due to the minimal leakage with cough on exam, I believe it to potentially be more related to SUI with sleeping position change. Encouraged patient to decrease the amount of acidity in the evenings (Coffee, Tea, Soda) and have sent a PT referral due to poor muscle coordination.  Patient to do Clobetasol cream nightly on the external vaginal skin for the next month. Can re-evaluate symptoms at follow up.  Patient encouraged to use estrogen cream twice a week and use other means of lubrication in between (I.e lubrication, coconut oil, aloe vera, or vitamin e) Patient had poor muscle control on exam with some mild  tenderness on the left obturator. I think she would benefit from PT. If she is still having leakage we can consider urethral bulking and can discuss this further at her follow up.   Patient to return in 3-4 months or sooner if needed.    Pinkie Manger G Irene Mitcham, NP

## 2023-09-02 NOTE — Patient Instructions (Addendum)
 Today we talked about ways to manage bladder urgency such as altering your diet to avoid irritative beverages and foods (bladder diet) as well as attempting to decrease stress and other exacerbating factors.  You can also chew a plain Tums 1-3 times per day to make your urine less acidic, especially if you have eating/drinking acidic things.    The Most Bothersome Foods* The Least Bothersome Foods*  Coffee - Regular & Decaf Tea - caffeinated Carbonated beverages - cola, non-colas, diet & caffeine-free Alcohols - Beer, Red Wine, White Wine, [address] - Grapefruit, [place], Orange, [employer] - Cranberry, Grapefruit, Orange, Pineapple Vegetables - Tomato & Tomato Products Flavor Enhancers - Hot peppers, Spicy foods, Chili, Horseradish, Vinegar, Monosodium glutamate (MSG) Artificial Sweeteners - NutraSweet, Sweet 'N Low, Equal (sweetener), Saccharin Ethnic foods - [place], [place], [place] food [employer] - low-fat & whole Fruits - Bananas, Blueberries, Honeydew melon, Pears, Raisins, Watermelon Vegetables - Broccoli, [address] Sprouts, [place], Carrots, Cauliflower, [place], Cucumber, Mushrooms, Peas, Radishes, Squash, Zucchini, White potatoes, Sweet potatoes & yams Poultry - Chicken, Eggs, [place], [employer] - Beef, Diplomatic Services operational officer, Lamb Seafood - Shrimp, [place] fish, Salmon Grains - Oat, Rice Snacks - Pretzels, Popcorn  *Theodosia Fishman et al. Diet and its role in interstitial cystitis/bladder pain syndrome (IC/BPS) and comorbid conditions. [employer]. BJU Int. 2012 Jan 11.    Estrogen cream: Please use 1gm inside the vagina twice a week. If you want something for support you can also add in coconut oil or vitamin e cream. Aloe vera can also be soothing for irritation.   Use the clobetasol cream nightly for at least the next month to get the skin irritation under control.

## 2023-09-10 ENCOUNTER — Telehealth: Payer: Self-pay

## 2023-09-10 MED ORDER — DENOSUMAB 60 MG/ML ~~LOC~~ SOSY
60.0000 mg | PREFILLED_SYRINGE | SUBCUTANEOUS | Status: AC
  Administered 2023-09-17: 60 mg via SUBCUTANEOUS

## 2023-09-10 NOTE — Telephone Encounter (Signed)
 Patient called & left a voicemail on triage line. She stated she needed to speak with Sherline Distel, RN regarding her prolia  injection. Message routed to [place], [place] for follow up.

## 2023-09-10 NOTE — Telephone Encounter (Signed)
 Call to patient. Summary of benefits reviewed with patient for prolia  injection and she verbalized understanding. Nurse visit scheduled for 09/17/23 at 1515. Patient agreeable to date and time of appointment. Aware copay is due at time of appointment. Order placed. Summary of benefits and PA scanned into Epic.   Encounter closed.

## 2023-09-10 NOTE — Telephone Encounter (Signed)
 See phone note dated 05-28-23.

## 2023-09-10 NOTE — Telephone Encounter (Signed)
 Call to [facility] to follow up on PA for prolia . Spoke with Rob, Advocate, who states authorization for prolia  is approved through 05/20/24. Authorization #: [id number]. Reference # for call: [id number]. Will await approval through fax.

## 2023-09-10 NOTE — Telephone Encounter (Signed)
 Deductible:  None  OOP MAX: $3300 ($115 met)   Annual exam: 10-05-22 JJ   Calcium:     9.6       Date: 08/21/23  Upcoming dental procedures: No   Hx of Kidney Disease: No   Last Bone Density Scan: 09/25/22  Is Prior Authorization needed: yes, on file through 05/20/24. Auth #: [id number].  Pt estimated Cost: $35  Coverage Details: Prolia  is subject to a $35 copay. Administration is covered at 100%.

## 2023-09-17 ENCOUNTER — Ambulatory Visit (INDEPENDENT_AMBULATORY_CARE_PROVIDER_SITE_OTHER)

## 2023-09-17 DIAGNOSIS — M810 Age-related osteoporosis without current pathological fracture: Secondary | ICD-10-CM

## 2023-09-17 NOTE — Progress Notes (Signed)
 Patient is in office today for a nurse visit for  prolia . Patient Injection was given in the  Left arm. Patient tolerated injection well.

## 2023-09-25 DIAGNOSIS — F3341 Major depressive disorder, recurrent, in partial remission: Secondary | ICD-10-CM | POA: Diagnosis not present

## 2023-10-01 DIAGNOSIS — Z780 Asymptomatic menopausal state: Secondary | ICD-10-CM | POA: Diagnosis not present

## 2023-10-01 DIAGNOSIS — Z13820 Encounter for screening for osteoporosis: Secondary | ICD-10-CM | POA: Diagnosis not present

## 2023-10-01 DIAGNOSIS — E2839 Other primary ovarian failure: Secondary | ICD-10-CM | POA: Diagnosis not present

## 2023-10-08 ENCOUNTER — Ambulatory Visit
Admission: RE | Admit: 2023-10-08 | Discharge: 2023-10-08 | Disposition: A | Discharge status: Home / Self Care | Source: Ambulatory Visit | Attending: Family Medicine | Admitting: Family Medicine

## 2023-10-08 ENCOUNTER — Other Ambulatory Visit: Payer: Self-pay

## 2023-10-08 VITALS — BP 133/75 | HR 83 | Temp 98.6°F | Resp 16

## 2023-10-08 DIAGNOSIS — J069 Acute upper respiratory infection, unspecified: Secondary | ICD-10-CM | POA: Diagnosis not present

## 2023-10-08 DIAGNOSIS — I491 Atrial premature depolarization: Secondary | ICD-10-CM

## 2023-10-08 MED ORDER — DM-GUAIFENESIN ER 30-600 MG PO TB12: 1.0000 | ORAL_TABLET | Freq: Two times a day (BID) | ORAL | 0 refills | Status: AC

## 2023-10-08 MED ORDER — FLUTICASONE PROPIONATE 50 MCG/ACT NA SUSP: 1.0000 | Freq: Every day | NASAL | 0 refills | Status: AC

## 2023-10-08 NOTE — ED Provider Notes (Addendum)
 [facility]    CSN: [id number] Arrival date & time: 10/08/23  1549      History   Chief Complaint Chief Complaint  Patient presents with   Cough    HPI [name] [name] is a 68 y.o. female.   Patient states she has had a cough for the last 3 days.  She is producing greenish-gray sputum.  She also has some postnasal drip and drainage.  She has no shortness of breath.  No chest pain.  No fever or chills.  She is feeling tired.  She states that she has not been around anyone who is sick.  No known exposure to COVID or flu.  She does not have underlying lung disease such as asthma.  Denies significant allergies. Patient states she has no known heart disease.  No heart murmur or irregular heartbeats noted on prior exams.  Denies any shortness of breath on exertion, chest pain or pressure.  Has not taken any cold medicine for her symptoms only Apple     Past Medical History:  Diagnosis Date   Arthritis    Bunion    left foot   Depression    stable on medication   Diverticulitis 2015   Diverticulitis of colon without hemorrhage 09/28/2013   Diverticulosis    Gallstones    GERD (gastroesophageal reflux disease)    Hammer toes of both feet    Hyperlipidemia    no meds needed   IBS (irritable bowel syndrome)    Lichen sclerosus    Meniscal injury    Tear   Onychomycosis of toenail    Osteopenia    Osteoporosis    PUD (peptic ulcer disease) 1980's   Renal cyst    4.5-6 cm stable single  seen by uro in past    Patient Active Problem List   Diagnosis Date Noted   Acute medial meniscus tear of left knee 07/30/2022   Elevated alkaline phosphatase measurement 09/26/2014   Renal cyst, left 12/31/2013   Osteopenia 12/31/2013   Vitamin D  deficiency 12/31/2013   Postmenopausal atrophic vaginitis 12/31/2013   Family history of colon cancer 10/02/2013   Wrist pain, right 05/13/2013   Hx of fall 05/13/2013   Hypercholesteremia 05/13/2013   Musculoskeletal leg pain  02/02/2013   Gait abnormality 02/02/2013   Chest discomfort 05/28/2011   Renal cyst 12/01/2010   Cough 09/27/2010   Neck pain 08/29/2010   Allergic rhinitis, seasonal 08/29/2010   Varicose veins of bilateral lower extremities with other complications 12/06/2008   Brachial neuritis or radiculitis 10/29/2008   HYPERLIPIDEMIA NEC/NOS 01/10/2007   DEPRESSION 01/08/2007   GERD 01/08/2007   Diverticulosis of colon 07/28/2002    Past Surgical History:  Procedure Laterality Date   CHOLECYSTECTOMY  1987   COLONOSCOPY     IRRIGATION AND DEBRIDEMENT SEBACEOUS CYST Right 3/ 2014   chest wall   KNEE ARTHROSCOPY WITH MENISCAL REPAIR Left 07/30/2022   Procedure: LEFT KNEE ARTHROSCOPY WITH MEDIAL MENISCAL ROOT REPAIR;  Surgeon: Wilhelmenia Harada, MD;  Location: [facility];  Service: Orthopedics;  Laterality: Left;   UPPER GASTROINTESTINAL ENDOSCOPY     WISDOM TOOTH EXTRACTION  age 29 & 55   lower teeth done first and upper wisdom teeth last    OB History     Gravida  0   Para  0   Term  0   Preterm  0   AB  0   Living  0      SAB  0   IAB  0   Ectopic  0   Multiple  0   Live Births  0            Home Medications    Prior to Admission medications   Medication Sig Start Date End Date Taking? Authorizing Provider  dextromethorphan-guaiFENesin (MUCINEX DM) 30-600 MG 12hr tablet Take 1 tablet by mouth 2 (two) times daily. 10/08/23  Yes Stephany Ehrich, MD  fluticasone  (FLONASE ) 50 MCG/ACT nasal spray Place 1 spray into both nostrils daily. 10/08/23  Yes Stephany Ehrich, MD  Calcium Carb-Cholecalciferol (CALCIUM 600+D3 PO) Take 1 tablet by mouth daily.    [provider]  clobetasol  ointment (TEMOVATE ) 0.05 % Apply 1 Application topically 2 (two) times daily. Use nightly for vaginal skin irritation related to lichens. 09/02/23   Zuleta, Kaitlin G, NP  denosumab  (PROLIA ) 60 MG/ML SOSY injection Inject 60 mg into the skin every 6 (six) months.     [provider]  estradiol  (ESTRACE  VAGINAL) 0.1 MG/GM vaginal cream Place 1 Applicatorful vaginally at bedtime. Rub a quarter size amount twice daily to affected area until next seen for punch biopsy 06/13/23   Reinaldo Caras, MD  FLUoxetine (PROZAC) 10 MG tablet Take 5 mg by mouth daily. 04/27/21   [provider]  FLUoxetine (PROZAC) 40 MG capsule Take 40 mg by mouth daily. 07/23/19   [provider]  Multiple Vitamins-Minerals (CENTRUM ULTRA WOMENS PO) Take 1 tablet by mouth every other day.    [provider]  omeprazole  (PRILOSEC) 20 MG capsule Take 1 capsule (20 mg total) by mouth daily. 08/21/23   Panosh, Wanda K, MD  Probiotic Product (PROBIOTIC PO) Take by mouth daily.    [provider]    Family History Family History  Problem Relation Age of Onset   Osteoarthritis Mother    COPD Mother    Pneumonia Mother    Osteoporosis Mother    Rheum arthritis Father    Pneumonia Father        deceased   Colon cancer Father 2   Heart attack Father    Breast cancer Sister 62   Colon polyps Sister    Cancer Maternal Grandmother        ? GI   Heart failure Maternal Grandfather    Breast cancer Paternal Grandmother 33   Heart failure Paternal Grandfather    Esophageal cancer Neg Hx    Rectal cancer Neg Hx    Stomach cancer Neg Hx     Social History Social History   Tobacco Use   Smoking status: Never   Smokeless tobacco: Never  Vaping Use   Vaping status: Never Used  Substance Use Topics   Alcohol use: No   Drug use: No    Comment: >16, >5     Allergies   Fosamax  [alendronate  sodium] and Tylenol  [acetaminophen ]   Review of Systems Review of Systems See HPI  Physical Exam Triage Vital Signs ED Triage Vitals  Encounter Vitals Group     BP 10/08/23 1554 133/75     Systolic BP Percentile --      Diastolic BP Percentile --      Pulse Rate 10/08/23 1554 83     Resp 10/08/23 1554 16     Temp 10/08/23 1554 98.6 F (37  C)     Temp src --      SpO2 10/08/23 1554 95 %     Weight --  Height --      Head Circumference --      Peak Flow --      Pain Score 10/08/23 1556 0     Pain Loc --      Pain Education --      Exclude from Growth Chart --    No data found.  Updated Vital Signs BP 133/75   Pulse 83   Temp 98.6 F (37 C)   Resp 16   LMP 11/18/2004   SpO2 95%  \    Physical Exam Constitutional:      General: She is not in acute distress.    Appearance: She is well-developed.  HENT:     Head: Normocephalic and atraumatic.     Right Ear: Tympanic membrane and ear canal normal.     Left Ear: Tympanic membrane and ear canal normal.     Nose: Congestion present.     Mouth/Throat:     Mouth: Mucous membranes are dry.     Pharynx: No posterior oropharyngeal erythema.  Eyes:     Conjunctiva/sclera: Conjunctivae normal.     Pupils: Pupils are equal, round, and reactive to light.  Cardiovascular:     Rate and Rhythm: Normal rate.     Heart sounds: Normal heart sounds.     Comments: Patient has irregularly irregular beat.  Sounds like trigeminy Pulmonary:     Effort: Pulmonary effort is normal. No respiratory distress.     Breath sounds: Normal breath sounds.  Abdominal:     General: There is no distension.     Palpations: Abdomen is soft.  Musculoskeletal:        General: Normal range of motion.     Cervical back: Normal range of motion.  Lymphadenopathy:     Cervical: No cervical adenopathy.  Skin:    General: Skin is warm and dry.  Neurological:     Mental Status: She is alert.      UC Treatments / Results  Labs (all labs ordered are listed, but only abnormal results are displayed) Labs Reviewed - No data to display  EKG  EKG shows patient has a rate of 72.  Normal ST and T wave changes.  She does have frequent PACs No old EKGs available for comparison  Radiology No results found.  Procedures Procedures (including critical care time)  Medications Ordered in  UC Medications - No data to display  Initial Impression / Assessment and Plan / UC Course  I have reviewed the triage vital signs and the nursing notes.  Pertinent labs & imaging results that were available during my care of the patient were reviewed by me and considered in my medical decision making (see chart for details).     Discussed that PACs usually do not have prediction for significant heart disease.  In any event I want her to make an appoint with her PCP and take her EKG in for evaluation Symptomatic care recommended for her viral URI Final Clinical Impressions(s) / UC Diagnoses   Final diagnoses:  Viral URI with cough  Premature atrial contractions     Discharge Instructions      Drink lots of fluids Use Flonase  every day until your symptoms have improved.  This will reduce the sinus drainage Take the guaifenesin twice a day this will help control your cough and mucus Make sure you are drinking lots of fluids This is a viral infection, antibiotics will not help at this point. See your doctor if not  improving in a week You are having occasional irregular heartbeats.  I am giving your EKG to you to share with your primary care doctor.  They do not look like an indicator of heart disease   ED Prescriptions     Medication Sig Dispense Auth. Provider   fluticasone  (FLONASE ) 50 MCG/ACT nasal spray Place 1 spray into both nostrils daily. 16 mL Stephany Ehrich, MD   dextromethorphan-guaiFENesin [facility] DM) 30-600 MG 12hr tablet Take 1 tablet by mouth 2 (two) times daily. 20 tablet Stephany Ehrich, MD      PDMP not reviewed this encounter.   Stephany Ehrich, MD 10/08/23 1639    Stephany Ehrich, MD 10/08/23 1640

## 2023-10-08 NOTE — ED Triage Notes (Signed)
 Congestion, cough prod greenish/greyish sputum which started Sunday. No fever. Has had advil .

## 2023-10-08 NOTE — Discharge Instructions (Signed)
 Drink lots of fluids Use Flonase  every day until your symptoms have improved.  This will reduce the sinus drainage Take the guaifenesin twice a day this will help control your cough and mucus Make sure you are drinking lots of fluids This is a viral infection, antibiotics will not help at this point. See your doctor if not improving in a week You are having occasional irregular heartbeats.  I am giving your EKG to you to share with your primary care doctor.  They do not look like an indicator of heart disease

## 2023-10-16 ENCOUNTER — Encounter: Payer: Self-pay | Admitting: Adult Health

## 2023-10-16 ENCOUNTER — Ambulatory Visit: Admitting: Adult Health

## 2023-10-16 VITALS — BP 120/70 | HR 81 | Temp 98.0°F | Wt 181.0 lb

## 2023-10-16 DIAGNOSIS — I491 Atrial premature depolarization: Secondary | ICD-10-CM | POA: Diagnosis not present

## 2023-10-16 DIAGNOSIS — J069 Acute upper respiratory infection, unspecified: Secondary | ICD-10-CM | POA: Diagnosis not present

## 2023-10-16 NOTE — Progress Notes (Signed)
 Subjective:    Patient ID: [name] [name], female    DOB: May 11, 1956, 68 y.o.   MRN: [id number]  HPI  68 year old female who  has a past medical history of Arthritis, Bunion, Depression, Diverticulitis (2015), Diverticulitis of colon without hemorrhage (09/28/2013), Diverticulosis, Gallstones, GERD (gastroesophageal reflux disease), Hammer toes of both feet, Hyperlipidemia, IBS (irritable bowel syndrome), Lichen sclerosus, Meniscal injury, Onychomycosis of toenail, Osteopenia, Osteoporosis, PUD (peptic ulcer disease) (1980's), and Renal cyst.  She presents to the office today for an acute issue. She was seen at urgent care 8  ays ago for a a minor cough and nasal congestion with sinus pressure that started 3 days prior.  She did have some discolored sputum and mucus.  She denied fevers, chills, shortness of breath, or chest pain.  She was prescribed Mucinex and Flonase  for suspected viral URI.  Was also noted on EKG at care that she had frequent PACs.  She denies a feeling of palpitations.  On follow-up she reports that she feels about 80% better.  No longer having any sinus pressure and not as much nasal drainage.  Some of her nasal drainage continues to be discolored but at other times it is clear as well.  She feels slightly fatigued but overall better.  She has not had any fevers or chills.  Denies chest pain, palpitations, or shortness of breath   Review of Systems See HPI   Past Medical History:  Diagnosis Date   Arthritis    Bunion    left foot   Depression    stable on medication   Diverticulitis 2015   Diverticulitis of colon without hemorrhage 09/28/2013   Diverticulosis    Gallstones    GERD (gastroesophageal reflux disease)    Hammer toes of both feet    Hyperlipidemia    no meds needed   IBS (irritable bowel syndrome)    Lichen sclerosus    Meniscal injury    Tear   Onychomycosis of toenail    Osteopenia    Osteoporosis    PUD (peptic ulcer disease) 1980's    Renal cyst    4.5-6 cm stable single  seen by uro in past    Social History   Socioeconomic History   Marital status: Divorced    Spouse name: Not on file   Number of children: 0   Years of education: Not on file   Highest education level: Master's degree (e.g., MA, MS, MEng, MEd, MSW, MBA)  Occupational History   Occupation: TEACHER    Employer: [employer]  Tobacco Use   Smoking status: Never   Smokeless tobacco: Never  Vaping Use   Vaping status: Never Used  Substance and Sexual Activity   Alcohol use: No   Drug use: No    Comment: >16, >5   Sexual activity: Not Currently    Partners: Male    Birth control/protection: Post-menopausal  Other Topics Concern   Not on file  Social History Narrative   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   Non smoker    Hh of 1 ... 2 cats    Single   G0P0   Mom is now in assisted living and she has moved into her old residence   Social Drivers of Corporate investment banker Strain: Low Risk  (08/18/2023)   Overall Financial Resource Strain (CARDIA)    Difficulty of Paying Living Expenses: Not hard at all  Food Insecurity: No Food Insecurity (08/18/2023)  Hunger Vital Sign    Worried About Running Out of Food in the Last Year: Never true    Ran Out of Food in the Last Year: Never true  Transportation Needs: No Transportation Needs (08/18/2023)   PRAPARE - Administrator, Civil Service (Medical): No    Lack of Transportation (Non-Medical): No  Physical Activity: Insufficiently Active (08/18/2023)   Exercise Vital Sign    Days of Exercise per Week: 2 days    Minutes of Exercise per Session: 30 min  Stress: No Stress Concern Present (08/18/2023)   [employer] of Occupational Health - Occupational Stress Questionnaire    Feeling of Stress : Only a little  Social Connections: Moderately Integrated (08/18/2023)   Social Connection and Isolation Panel [NHANES]    Frequency of Communication  with Friends and Family: More than three times a week    Frequency of Social Gatherings with Friends and Family: Twice a week    Attends Religious Services: More than 4 times per year    Active Member of [employer] or Organizations: Yes    Attends Engineer, structural: More than 4 times per year    Marital Status: Divorced  Intimate Partner Violence: Not At Risk (06/14/2023)   Humiliation, Afraid, Rape, and Kick questionnaire    Fear of Current or Ex-Partner: No    Emotionally Abused: No    Physically Abused: No    Sexually Abused: No    Past Surgical History:  Procedure Laterality Date   CHOLECYSTECTOMY  1987   COLONOSCOPY     IRRIGATION AND DEBRIDEMENT SEBACEOUS CYST Right 3/ 2014   chest wall   KNEE ARTHROSCOPY WITH MENISCAL REPAIR Left 07/30/2022   Procedure: LEFT KNEE ARTHROSCOPY WITH MEDIAL MENISCAL ROOT REPAIR;  Surgeon: Wilhelmenia Harada, MD;  Location: [facility];  Service: Orthopedics;  Laterality: Left;   UPPER GASTROINTESTINAL ENDOSCOPY     WISDOM TOOTH EXTRACTION  age 62 & 41   lower teeth done first and upper wisdom teeth last    Family History  Problem Relation Age of Onset   Osteoarthritis Mother    COPD Mother    Pneumonia Mother    Osteoporosis Mother    Rheum arthritis Father    Pneumonia Father        deceased   Colon cancer Father 55   Heart attack Father    Breast cancer Sister 14   Colon polyps Sister    Cancer Maternal Grandmother        ? GI   Heart failure Maternal Grandfather    Breast cancer Paternal Grandmother 29   Heart failure Paternal Grandfather    Esophageal cancer Neg Hx    Rectal cancer Neg Hx    Stomach cancer Neg Hx     Allergies  Allergen Reactions   Fosamax  [Alendronate  Sodium] Other (See Comments)    Intense pain   Tylenol  [Acetaminophen ] Other (See Comments)    "Irritates my esophagus"    Current Outpatient Medications on File Prior to Visit  Medication Sig Dispense Refill   Calcium  Carb-Cholecalciferol (CALCIUM 600+D3 PO) Take 1 tablet by mouth daily.     clobetasol  ointment (TEMOVATE ) 0.05 % Apply 1 Application topically 2 (two) times daily. Use nightly for vaginal skin irritation related to lichens. 60 g 2   denosumab  (PROLIA ) 60 MG/ML SOSY injection Inject 60 mg into the skin every 6 (six) months.     dextromethorphan-guaiFENesin (MUCINEX DM) 30-600 MG 12hr tablet Take 1 tablet  by mouth 2 (two) times daily. 20 tablet 0   estradiol  (ESTRACE  VAGINAL) 0.1 MG/GM vaginal cream Place 1 Applicatorful vaginally at bedtime. Rub a quarter size amount twice daily to affected area until next seen for punch biopsy 42.5 g 12   FLUoxetine (PROZAC) 10 MG tablet Take 5 mg by mouth daily.     FLUoxetine (PROZAC) 40 MG capsule Take 40 mg by mouth daily.     fluticasone  (FLONASE ) 50 MCG/ACT nasal spray Place 1 spray into both nostrils daily. 16 mL 0   Multiple Vitamins-Minerals (CENTRUM ULTRA WOMENS PO) Take 1 tablet by mouth every other day.     omeprazole  (PRILOSEC) 20 MG capsule Take 1 capsule (20 mg total) by mouth daily. 30 capsule 5   Probiotic Product (PROBIOTIC PO) Take by mouth daily.     No current facility-administered medications on file prior to visit.    BP 120/70   Pulse 81   Temp 98 F (36.7 C) (Oral)   Wt 181 lb (82.1 kg)   LMP 11/18/2004   SpO2 99%   BMI 30.59 kg/m       Objective:   Physical Exam Vitals and nursing note reviewed.  Constitutional:      Appearance: Normal appearance.  HENT:     Right Ear: Hearing, tympanic membrane, ear canal and external ear normal.     Left Ear: Hearing, tympanic membrane, ear canal and external ear normal.     Nose:     Right Turbinates: Not enlarged or swollen.     Left Turbinates: Not enlarged or swollen.     Right Sinus: No maxillary sinus tenderness or frontal sinus tenderness.     Left Sinus: No maxillary sinus tenderness or frontal sinus tenderness.     Mouth/Throat:     Mouth: Mucous membranes are moist.      Pharynx: Oropharynx is clear. Uvula midline.  Cardiovascular:     Rate and Rhythm: Normal rate and regular rhythm.     Pulses: Normal pulses.     Heart sounds: Normal heart sounds.  Skin:    General: Skin is warm and dry.  Neurological:     General: No focal deficit present.     Mental Status: She is alert and oriented to person, place, and time.  Psychiatric:        Mood and Affect: Mood normal.        Behavior: Behavior normal.        Thought Content: Thought content normal.        Judgment: Judgment normal.       Assessment & Plan:  1. Viral URI (Primary) - Symptoms nearly resolved. She can continue with OTC medications. She should feel back to normal in the next few days  - Follow up as needed  2. PAC (premature atrial contraction) - I did not auscultate any PACs today in the office   Alto Atta, NP

## 2023-10-21 ENCOUNTER — Ambulatory Visit: Admitting: Physical Therapy

## 2023-10-21 DIAGNOSIS — M810 Age-related osteoporosis without current pathological fracture: Secondary | ICD-10-CM | POA: Diagnosis not present

## 2023-10-21 DIAGNOSIS — R7309 Other abnormal glucose: Secondary | ICD-10-CM | POA: Diagnosis not present

## 2023-10-25 DIAGNOSIS — N281 Cyst of kidney, acquired: Secondary | ICD-10-CM | POA: Diagnosis not present

## 2023-10-29 NOTE — Therapy (Unsigned)
 OUTPATIENT PHYSICAL THERAPY FEMALE PELVIC EVALUATION   Patient Name: [name] [name] MRN: [id number] DOB:01-13-1956, 68 y.o., female Today's Date: 10/30/2023  END OF SESSION:  PT End of Session - 10/30/23 1453     Visit Number 1    Date for PT Re-Evaluation 01/22/24    Authorization Type humana    Authorization - Visit Number 1    Authorization - Number of Visits 10    PT Start Time 1445    PT Stop Time 1525    PT Time Calculation (min) 40 min    Activity Tolerance Patient tolerated treatment well    Behavior During Therapy WFL for tasks assessed/performed             Past Medical History:  Diagnosis Date   Arthritis    Bunion    left foot   Depression    stable on medication   Diverticulitis 2015   Diverticulitis of colon without hemorrhage 09/28/2013   Diverticulosis    Gallstones    GERD (gastroesophageal reflux disease)    Hammer toes of both feet    Hyperlipidemia    no meds needed   IBS (irritable bowel syndrome)    Lichen sclerosus    Meniscal injury    Tear   Onychomycosis of toenail    Osteopenia    Osteoporosis    PUD (peptic ulcer disease) 1980's   Renal cyst    4.5-6 cm stable single  seen by uro in past   Past Surgical History:  Procedure Laterality Date   CHOLECYSTECTOMY  1987   COLONOSCOPY     IRRIGATION AND DEBRIDEMENT SEBACEOUS CYST Right 3/ 2014   chest wall   KNEE ARTHROSCOPY WITH MENISCAL REPAIR Left 07/30/2022   Procedure: LEFT KNEE ARTHROSCOPY WITH MEDIAL MENISCAL ROOT REPAIR;  Surgeon: Wilhelmenia Harada, MD;  Location: [facility];  Service: Orthopedics;  Laterality: Left;   UPPER GASTROINTESTINAL ENDOSCOPY     WISDOM TOOTH EXTRACTION  age 89 & 23   lower teeth done first and upper wisdom teeth last   Patient Active Problem List   Diagnosis Date Noted   Acute medial meniscus tear of left knee 07/30/2022   Elevated alkaline phosphatase measurement 09/26/2014   Renal cyst, left 12/31/2013   Osteopenia 12/31/2013    Vitamin D  deficiency 12/31/2013   Postmenopausal atrophic vaginitis 12/31/2013   Family history of colon cancer 10/02/2013   Wrist pain, right 05/13/2013   Hx of fall 05/13/2013   Hypercholesteremia 05/13/2013   Musculoskeletal leg pain 02/02/2013   Gait abnormality 02/02/2013   Chest discomfort 05/28/2011   Renal cyst 12/01/2010   Cough 09/27/2010   Neck pain 08/29/2010   Allergic rhinitis, seasonal 08/29/2010   Varicose veins of bilateral lower extremities with other complications 12/06/2008   Brachial neuritis or radiculitis 10/29/2008   HYPERLIPIDEMIA NEC/NOS 01/10/2007   DEPRESSION 01/08/2007   GERD 01/08/2007   Diverticulosis of colon 07/28/2002    PCP: Reginal Capra, MD  REFERRING PROVIDER: Zuleta, Kaitlin G, NP   REFERRING DIAG:  R35.1 (ICD-10-CM) - Nocturia  M62.89 (ICD-10-CM) - Pelvic floor dysfunction    THERAPY DIAG:  Muscle weakness (generalized)  Other lack of coordination  Rationale for Evaluation and Treatment: Rehabilitation  ONSET DATE: 12/24  SUBJECTIVE:  SUBJECTIVE STATEMENT: Patient has a dampness during the night.  Fluid intake: Drinks: 2 cups coffee AM, 1 cup coffee PM, 16oz Unsweetened ice tea with dinner, diet coke, water, stops at 10pm, sleeps around 12am per day   PAIN:  Are you having pain? No  PRECAUTIONS: None  RED FLAGS: None   WEIGHT BEARING RESTRICTIONS: No  FALLS:  Has patient fallen in last 6 months? No  OCCUPATION: volunteer work at [employer]   ACTIVITY LEVEL : walking  PLOF: Independent  PATIENT GOALS: not wake up as much during the night, strengthen the muscles  PERTINENT HISTORY:  Lichens  Sclerosus; IBS; Osteoporosis; Cholecystectomy;  Sexual abuse: No  BOWEL MOVEMENT: no issues   URINATION: Pain with urination:  No Fully empty bladder:Yes,  When urinating, patient feels dribbling after finishing  Stream: Strong Urgency: No Frequency: Day time voids 6-8.  Nocturia: 1-2 times per night to void. AS soon as she wakes up she will have to urinate.  Leakage: while asleep Pads: No  INTERCOURSE: not active   PREGNANCY:None   PROLAPSE: None   OBJECTIVE:  Note: Objective measures were completed at Evaluation unless otherwise noted.  DIAGNOSTIC FINDINGS:  Pelvic floor strength II/V   Pelvic floor musculature: Right levator non-tender, Right obturator tender, Left levator non-tender, Left obturator non-tender Post Void Residual 2 mL    PATIENT SURVEYS:  PFIQ-7: 6 UIQ-7: 10  COGNITION: Overall cognitive status: Within functional limits for tasks assessed     SENSATION: Light touch: Appears intact  LUMBAR SPECIAL TESTS:  Trendelenburg sign: Positive both sides  LUMBARAROM/PROM: lumbar ROM is full   LOWER EXTREMITY ROM:  Passive ROM Right eval Left eval  Hip internal rotation 10 10  Hip external rotation 40 50   (Blank rows = not tested)  LOWER EXTREMITY MMT:  MMT Right eval Left eval  Hip extension 4/5 4/5  Hip abduction 4/5 4/5  Hip adduction 4/5 4/5   (Blank rows = not tested) PALPATION:    External Perineal Exam: Lichens sclerosis located on the perineum and rectal area, decreased mobility of the perineal body                             Internal Pelvic Floor: tightness along the sides of the introitus and levator ani  Patient confirms identification and approves PT to assess internal pelvic floor and treatment Yes No emotional/communication barriers or cognitive limitation. Patient is motivated to learn. Patient understands and agrees with treatment goals and plan. PT explains patient will be examined in standing, sitting, and lying down to see how their muscles and joints work. When they are ready, they will be asked to remove their underwear so PT can examine their  perineum. The patient is also given the option of providing their own chaperone as one is not provided in our facility. The patient also has the right and is explained the right to defer or refuse any part of the evaluation or treatment including the internal exam. With the patient's consent, PT will use one gloved finger to gently assess the muscles of the pelvic floor, seeing how well it contracts and relaxes and if there is muscle symmetry. After, the patient will get dressed and PT and patient will discuss exam findings and plan of care. PT and patient discuss plan of care, schedule, attendance policy and HEP activities.   PELVIC MMT:   MMT eval  Vaginal 2/5  (Blank rows = not  tested)        TONE: average  PROLAPSE: none  TODAY'S TREATMENT:                                                                                                                              DATE: 10/30/23  EVAL Examination completed, findings reviewed, pt educated on POC, HEP, and female pelvic floor anatomy, reasoning with pelvic floor assessment internally with pt consent. Pt motivated to participate in PT and agreeable to attempt recommendations.     PATIENT EDUCATION:  10/30/23 Education details: Access Code: [email or web address]; trying AZO or vitamin B to see if there is urine on her underwear due to different color; educated patient on vaginal health Person educated: Patient Education method: Explanation, Demonstration, Tactile cues, Verbal cues, and Handouts Education comprehension: verbalized understanding, returned demonstration, verbal cues required, tactile cues required, and needs further education  HOME EXERCISE PROGRAM: 10/30/23 Access Code: [email or web address] URL: [email or web address] Date: 10/30/2023 Prepared by: Marsha Skeen  Program Notes try AZO or Vitamin B to see if there is urine leakage  Exercises - Seated Pelvic Floor Contraction  - 3 x daily - 7 x weekly - 1 sets - 6 reps - 5 sec  hold  ASSESSMENT:  CLINICAL IMPRESSION: Patient is a 68 y.o. female who was seen today for physical therapy evaluation and treatment for nocturia and pelvic floor dysfunction. Patient reports she has been leaking urine at night since 12/24. She has to rush to the bathroom when she wakes up in the morning.  Her pelvic floor strength is 2/5 with tightness in the introitus, levator ani, and perineal body. She has lichens sclerosis from the perineal body to the rectum which decreases the mobility of the skin. She has weakness in her hips and core. Patient will benefit from skilled therapy on care of her vaginal skin and pelvic floor strength to reduce the urinary leakage during the night and irritate the Lichens Sclerosis.   OBJECTIVE IMPAIRMENTS: decreased balance, decreased strength, and increased fascial restrictions.   ACTIVITY LIMITATIONS: sleeping and continence  PARTICIPATION LIMITATIONS: community activity  PERSONAL FACTORS: Age, Fitness, and Time since onset of injury/illness/exacerbation are also affecting patient's functional outcome.   REHAB POTENTIAL: Excellent  CLINICAL DECISION MAKING: Stable/uncomplicated  EVALUATION COMPLEXITY: Low   GOALS: Goals reviewed with patient? Yes  SHORT TERM GOALS: Target date: 11/27/23  Patient will be independent with initial HEP for pelvic floor strength to reduce urinary leakage.  Baseline: Goal status: INITIAL  2.  Patient educated on vaginal health to reduce irritation to the Lichens Sclerosis Baseline:  Goal status: INITIAL  3.  Patient will be able to walk to the bathroom slowly when she wakes up first thing in the morning to reduce chance of falls.  Baseline:  Goal status: INITIAL    LONG TERM GOALS: Target date: 01/22/24  Patient is independent with advanced HEP for pelvic floor and core strength to reduce urinary leakage during the night to  reduce the irritation to the vaginal area.  Baseline:  Goal status: INITIAL  2.   Pelvic floor strength >/= 3/5 to reduce the urinary leakage during the night.  Baseline:  Goal status: INITIAL  3.  Patient understands how to perform manual work to the area of Lichens Sclerosis to  keep the tissue mobility and reduce scaring.  Baseline:  Goal status: INITIAL    PLAN:  PT FREQUENCY: 1-2x/week  PT DURATION: 12 weeks  PLANNED INTERVENTIONS: 97110-Therapeutic exercises, 97530- Therapeutic activity, 97112- Neuromuscular re-education, 97535- Self Care, 16109- Manual therapy, Patient/Family education, and Biofeedback  PLAN FOR NEXT SESSION: manual work to the pelvic floor, education on how to mobilize the tissue, see if she used the AZO or vitamin B, diaphragmatic breathing   Marsha Skeen, PT 10/30/23 3:44 PM

## 2023-10-30 ENCOUNTER — Other Ambulatory Visit: Payer: Self-pay

## 2023-10-30 ENCOUNTER — Ambulatory Visit: Attending: Obstetrics and Gynecology | Admitting: Physical Therapy

## 2023-10-30 ENCOUNTER — Encounter: Payer: Self-pay | Admitting: Physical Therapy

## 2023-10-30 DIAGNOSIS — R278 Other lack of coordination: Secondary | ICD-10-CM | POA: Diagnosis not present

## 2023-10-30 DIAGNOSIS — M6281 Muscle weakness (generalized): Secondary | ICD-10-CM | POA: Insufficient documentation

## 2023-11-01 DIAGNOSIS — H43813 Vitreous degeneration, bilateral: Secondary | ICD-10-CM | POA: Diagnosis not present

## 2023-11-01 DIAGNOSIS — H16233 Neurotrophic keratoconjunctivitis, bilateral: Secondary | ICD-10-CM | POA: Diagnosis not present

## 2023-11-01 DIAGNOSIS — H35373 Puckering of macula, bilateral: Secondary | ICD-10-CM | POA: Diagnosis not present

## 2023-11-01 DIAGNOSIS — H04123 Dry eye syndrome of bilateral lacrimal glands: Secondary | ICD-10-CM | POA: Diagnosis not present

## 2023-11-01 DIAGNOSIS — H2513 Age-related nuclear cataract, bilateral: Secondary | ICD-10-CM | POA: Diagnosis not present

## 2023-11-02 ENCOUNTER — Ambulatory Visit
Admission: RE | Admit: 2023-11-02 | Discharge: 2023-11-02 | Disposition: A | Discharge status: Home / Self Care | Source: Ambulatory Visit | Attending: Family Medicine | Admitting: Family Medicine

## 2023-11-02 ENCOUNTER — Other Ambulatory Visit: Payer: Self-pay

## 2023-11-02 DIAGNOSIS — R1032 Left lower quadrant pain: Secondary | ICD-10-CM | POA: Diagnosis not present

## 2023-11-02 MED ORDER — AMOXICILLIN-POT CLAVULANATE 875-125 MG PO TABS: 1.0000 | ORAL_TABLET | Freq: Two times a day (BID) | ORAL | 0 refills | Status: DC

## 2023-11-02 NOTE — ED Provider Notes (Signed)
 [facility]    CSN: [id number] Arrival date & time: 11/02/23  1416      History   Chief Complaint Chief Complaint  Patient presents with   Abdominal Pain    Hx of diverticulitis     HPI [name] [name] is a 68 y.o. female.   HPI 68 year old female presents with abdominal pain and is concerned with possible diverticulitis.  Patient reports history of diverticulitis.  Past Medical History:  Diagnosis Date   Arthritis    Bunion    left foot   Depression    stable on medication   Diverticulitis 2015   Diverticulitis of colon without hemorrhage 09/28/2013   Diverticulosis    Gallstones    GERD (gastroesophageal reflux disease)    Hammer toes of both feet    Hyperlipidemia    no meds needed   IBS (irritable bowel syndrome)    Lichen sclerosus    Meniscal injury    Tear   Onychomycosis of toenail    Osteopenia    Osteoporosis    PUD (peptic ulcer disease) 1980's   Renal cyst    4.5-6 cm stable single  seen by uro in past    Patient Active Problem List   Diagnosis Date Noted   Acute medial meniscus tear of left knee 07/30/2022   Elevated alkaline phosphatase measurement 09/26/2014   Renal cyst, left 12/31/2013   Osteopenia 12/31/2013   Vitamin D  deficiency 12/31/2013   Postmenopausal atrophic vaginitis 12/31/2013   Family history of colon cancer 10/02/2013   Wrist pain, right 05/13/2013   Hx of fall 05/13/2013   Hypercholesteremia 05/13/2013   Musculoskeletal leg pain 02/02/2013   Gait abnormality 02/02/2013   Chest discomfort 05/28/2011   Renal cyst 12/01/2010   Cough 09/27/2010   Neck pain 08/29/2010   Allergic rhinitis, seasonal 08/29/2010   Varicose veins of bilateral lower extremities with other complications 12/06/2008   Brachial neuritis or radiculitis 10/29/2008   HYPERLIPIDEMIA NEC/NOS 01/10/2007   DEPRESSION 01/08/2007   GERD 01/08/2007   Diverticulosis of colon 07/28/2002    Past Surgical History:  Procedure Laterality Date    CHOLECYSTECTOMY  1987   COLONOSCOPY     IRRIGATION AND DEBRIDEMENT SEBACEOUS CYST Right 3/ 2014   chest wall   KNEE ARTHROSCOPY WITH MENISCAL REPAIR Left 07/30/2022   Procedure: LEFT KNEE ARTHROSCOPY WITH MEDIAL MENISCAL ROOT REPAIR;  Surgeon: Wilhelmenia Harada, MD;  Location: [facility];  Service: Orthopedics;  Laterality: Left;   UPPER GASTROINTESTINAL ENDOSCOPY     WISDOM TOOTH EXTRACTION  age 60 & 73   lower teeth done first and upper wisdom teeth last    OB History     Gravida  0   Para  0   Term  0   Preterm  0   AB  0   Living  0      SAB  0   IAB  0   Ectopic  0   Multiple  0   Live Births  0            Home Medications    Prior to Admission medications   Medication Sig Start Date End Date Taking? Authorizing Provider  amoxicillin -clavulanate (AUGMENTIN ) 875-125 MG tablet Take 1 tablet by mouth every 12 (twelve) hours. 11/02/23  Yes Leonides Ramp, FNP  Calcium Carb-Cholecalciferol (CALCIUM 600+D3 PO) Take 1 tablet by mouth daily.    [provider]  clobetasol  ointment (TEMOVATE ) 0.05 % Apply 1 Application topically  2 (two) times daily. Use nightly for vaginal skin irritation related to lichens. 09/02/23   Zuleta, Kaitlin G, NP  denosumab  (PROLIA ) 60 MG/ML SOSY injection Inject 60 mg into the skin every 6 (six) months.    [provider]  dextromethorphan-guaiFENesin  (MUCINEX  DM) 30-600 MG 12hr tablet Take 1 tablet by mouth 2 (two) times daily. 10/08/23   Stephany Ehrich, MD  estradiol  (ESTRACE  VAGINAL) 0.1 MG/GM vaginal cream Place 1 Applicatorful vaginally at bedtime. Rub a quarter size amount twice daily to affected area until next seen for punch biopsy 06/13/23   Reinaldo Caras, MD  FLUoxetine (PROZAC) 10 MG tablet Take 5 mg by mouth daily. 04/27/21   [provider]  FLUoxetine (PROZAC) 40 MG capsule Take 40 mg by mouth daily. 07/23/19   [provider]  fluticasone  (FLONASE ) 50 MCG/ACT nasal  spray Place 1 spray into both nostrils daily. 10/08/23   Stephany Ehrich, MD  Multiple Vitamins-Minerals (CENTRUM ULTRA WOMENS PO) Take 1 tablet by mouth every other day.    [provider]  omeprazole  (PRILOSEC) 20 MG capsule Take 1 capsule (20 mg total) by mouth daily. 08/21/23   Panosh, Wanda K, MD  Probiotic Product (PROBIOTIC PO) Take by mouth daily.    [provider]    Family History Family History  Problem Relation Age of Onset   Osteoarthritis Mother    COPD Mother    Pneumonia Mother    Osteoporosis Mother    Rheum arthritis Father    Pneumonia Father        deceased   Colon cancer Father 71   Heart attack Father    Breast cancer Sister 17   Colon polyps Sister    Cancer Maternal Grandmother        ? GI   Heart failure Maternal Grandfather    Breast cancer Paternal Grandmother 27   Heart failure Paternal Grandfather    Esophageal cancer Neg Hx    Rectal cancer Neg Hx    Stomach cancer Neg Hx     Social History Social History   Tobacco Use   Smoking status: Never   Smokeless tobacco: Never  Vaping Use   Vaping status: Never Used  Substance Use Topics   Alcohol use: No   Drug use: No    Comment: >16, >5     Allergies   Fosamax  [alendronate  sodium] and Tylenol  [acetaminophen ]   Review of Systems Review of Systems  Gastrointestinal:  Positive for abdominal pain.  All other systems reviewed and are negative.    Physical Exam Triage Vital Signs ED Triage Vitals [11/02/23 1431]  Encounter Vitals Group     BP      Girls Systolic BP Percentile      Girls Diastolic BP Percentile      Boys Systolic BP Percentile      Boys Diastolic BP Percentile      Pulse      Resp      Temp      Temp src      SpO2      Weight      Height      Head Circumference      Peak Flow      Pain Score 7     Pain Loc      Pain Education      Exclude from Growth Chart    No data found.  Updated Vital Signs LMP 11/18/2004   Visual  Acuity Right  Eye Distance:   Left Eye Distance:   Bilateral Distance:    Right Eye Near:   Left Eye Near:    Bilateral Near:     Physical Exam Vitals and nursing note reviewed.  Constitutional:      Appearance: She is well-developed and normal weight.  HENT:     Head: Normocephalic and atraumatic.     Mouth/Throat:     Mouth: Mucous membranes are moist.     Pharynx: Oropharynx is clear.   Eyes:     Extraocular Movements: Extraocular movements intact.     Pupils: Pupils are equal, round, and reactive to light.    Cardiovascular:     Rate and Rhythm: Normal rate and regular rhythm.     Heart sounds: Normal heart sounds. No murmur heard. Pulmonary:     Effort: Pulmonary effort is normal.     Breath sounds: Rhonchi present. No wheezing or rales.  Chest:     Chest wall: No tenderness.  Abdominal:     General: Abdomen is flat. Bowel sounds are absent. There is no distension or abdominal bruit.     Palpations: Abdomen is soft. There is no shifting dullness, fluid wave, hepatomegaly, splenomegaly, mass or pulsatile mass.     Tenderness: There is abdominal tenderness in the suprapubic area and left lower quadrant. There is no right CVA tenderness, left CVA tenderness, guarding or rebound. Negative signs include Murphy's sign, Rovsing's sign, McBurney's sign, psoas sign and obturator sign.   Skin:    General: Skin is warm and dry.   Neurological:     General: No focal deficit present.     Mental Status: She is alert and oriented to person, place, and time.   Psychiatric:        Mood and Affect: Mood normal.        Behavior: Behavior normal.      UC Treatments / Results  Labs (all labs ordered are listed, but only abnormal results are displayed) Labs Reviewed  CBC WITH DIFFERENTIAL/PLATELET  COMPREHENSIVE METABOLIC PANEL WITH GFR    EKG   Radiology No results found.  Procedures Procedures (including critical care time)  Medications Ordered in UC Medications -  No data to display  Initial Impression / Assessment and Plan / UC Course  I have reviewed the triage vital signs and the nursing notes.  Pertinent labs & imaging results that were available during my care of the patient were reviewed by me and considered in my medical decision making (see chart for details).     MDM: 1.  Abdominal pain, left lower quadrant-Rx'd Augmentin  875/125 mg tablet: Take 1 tablet twice daily x 7 days-empirically for possible diverticulitis flare, CBC with differential ordered. Advised patient to take medication as directed with food to completion.  Encouraged to increase daily water intake to 64 ounces per day while taking this medication.  Advised we will follow-up with lab results once received tomorrow, Sunday, 11/03/2023.  Advised if symptoms worsen and/or unresolved please follow-up with your PCP or here for further evaluation.  Patient discharged home, hemodynamically stable. Final Clinical Impressions(s) / UC Diagnoses   Final diagnoses:  Abdominal pain, left lower quadrant     Discharge Instructions      Advised patient to take medication as directed with food to completion.  Encouraged to increase daily water intake to 64 ounces per day while taking this medication.  Advised we will follow-up with lab results once received tomorrow, Sunday, 11/03/2023.  Advised if symptoms  worsen and/or unresolved please follow-up with your PCP or here for further evaluation.     ED Prescriptions     Medication Sig Dispense Auth. Provider   amoxicillin -clavulanate (AUGMENTIN ) 875-125 MG tablet Take 1 tablet by mouth every 12 (twelve) hours. 14 tablet Delno Blaisdell, FNP      PDMP not reviewed this encounter.   Leonides Ramp, FNP 11/02/23 1526

## 2023-11-02 NOTE — ED Triage Notes (Addendum)
 Pt c/o lower abd pain x 2 days. Chills last night. Denies N/V/D. Advil  prn. Tried new probiotic a few days ago, no other changes to meds or diet. Hx of diverticulitis.

## 2023-11-02 NOTE — Discharge Instructions (Addendum)
 Advised patient to take medication as directed with food to completion.  Encouraged to increase daily water intake to 64 ounces per day while taking this medication.  Advised we will follow-up with lab results once received tomorrow, Sunday, 11/03/2023.  Advised if symptoms worsen and/or unresolved please follow-up with your PCP or here for further evaluation.

## 2023-11-04 LAB — COMPREHENSIVE METABOLIC PANEL WITH GFR
ALT: 13 IU/L (ref 0–32)
AST: 16 IU/L (ref 0–40)
Albumin: 4.5 g/dL (ref 3.9–4.9)
Alkaline Phosphatase: 108 IU/L (ref 44–121)
BUN/Creatinine Ratio: 21 (ref 12–28)
BUN: 15 mg/dL (ref 8–27)
Bilirubin Total: 0.7 mg/dL (ref 0.0–1.2)
CO2: 21 mmol/L (ref 20–29)
Calcium: 9.2 mg/dL (ref 8.7–10.3)
Chloride: 99 mmol/L (ref 96–106)
Creatinine, Ser: 0.71 mg/dL (ref 0.57–1.00)
Globulin, Total: 2.6 g/dL (ref 1.5–4.5)
Glucose: 90 mg/dL (ref 70–99)
Potassium: 4.5 mmol/L (ref 3.5–5.2)
Sodium: 136 mmol/L (ref 134–144)
Total Protein: 7.1 g/dL (ref 6.0–8.5)
eGFR: 93 mL/min/{1.73_m2} (ref >59)

## 2023-11-04 LAB — CBC WITH DIFFERENTIAL/PLATELET
Basophils Absolute: 0.1 10*3/uL (ref 0.0–0.2)
Basos: 1 %
EOS (ABSOLUTE): 0.1 10*3/uL (ref 0.0–0.4)
Eos: 1 %
Hematocrit: 43.4 % (ref 34.0–46.6)
Hemoglobin: 13.7 g/dL (ref 11.1–15.9)
Immature Grans (Abs): 0 10*3/uL (ref 0.0–0.1)
Immature Granulocytes: 0 %
Lymphocytes Absolute: 2 10*3/uL (ref 0.7–3.1)
Lymphs: 23 %
MCH: 29.3 pg (ref 26.6–33.0)
MCHC: 31.6 g/dL (ref 31.5–35.7)
MCV: 93 fL (ref 79–97)
Monocytes Absolute: 0.9 10*3/uL (ref 0.1–0.9)
Monocytes: 11 %
Neutrophils Absolute: 5.6 10*3/uL (ref 1.4–7.0)
Neutrophils: 64 %
Platelets: 302 10*3/uL (ref 150–450)
RBC: 4.67 x10E6/uL (ref 3.77–5.28)
RDW: 13.2 % (ref 11.7–15.4)
WBC: 8.6 10*3/uL (ref 3.4–10.8)

## 2023-11-12 ENCOUNTER — Ambulatory Visit: Admitting: Physician Assistant

## 2023-11-12 ENCOUNTER — Encounter: Payer: Self-pay | Admitting: Physician Assistant

## 2023-11-12 VITALS — BP 112/71

## 2023-11-12 DIAGNOSIS — L249 Irritant contact dermatitis, unspecified cause: Secondary | ICD-10-CM

## 2023-11-12 MED ORDER — TRIAMCINOLONE ACETONIDE 0.1 % EX OINT: 1.0000 | TOPICAL_OINTMENT | Freq: Every day | CUTANEOUS | 1 refills | Status: DC | PRN

## 2023-11-12 MED ORDER — PIMECROLIMUS 1 % EX CREA: TOPICAL_CREAM | Freq: Two times a day (BID) | CUTANEOUS | 1 refills | Status: AC

## 2023-11-12 NOTE — Progress Notes (Signed)
   New Patient Visit   Subjective  [name] [name] is a 68 y.o. female who presents for the following: Lichen sclerosus - Diagnosed in January 2025 but she thinks she has had for about 3 years. She uses Clobetasol  oint daily because she is dry and itchy. Sometimes she gets irritated by the elastic in her underwear. She says her condition is worse in the summer. It is itchy and sometimes painful. She has used betamethasone  in the past. She is not sexually active. She has no problems with urination. She washes with Dove Sensitive skin or [place] soap or [employer] baby wash.  Biopsy was performed by GYN early this year and results reviewed were negative.      The following portions of the chart were reviewed this encounter and updated as appropriate: medications, allergies, medical history  Review of Systems:  No other skin or systemic complaints except as noted in HPI or Assessment and Plan.  Objective  Well appearing patient in no apparent distress; mood and affect are within normal limits.   A focused examination was performed of the following areas: Vaginal and buttocks.   Relevant exam findings are noted in the Assessment and Plan.    Assessment & Plan   IRRITANT DERMATITIS Exam: moderate erythema. No fissures. No white patches.   Treatment Plan: Recommend Vanicream body wash.   Discontinue Clobetasol . Start Triamcinolone  0.1% oint twice daily x 2 weeks. After 2 weeks, switch to Elidel cream twice daily. Continue to alternate every 2 weeks until clear. Once clear, apply Aquaphor daily.  Recommend switching detergent to ALL Free and Clear and discontinue dryer sheets. Recommend changing underwear to the bot short type to decrease the irritation from the elastic.  Reassured patient that she DOES NOT have lichen sclerosis and that clinically this looks to be an irritant dermatitis.    IRRITANT CONTACT DERMATITIS, UNSPECIFIED TRIGGER    Return in about 8 weeks  (around 01/07/2024) for Follow up.  I, Roseline Hutchinson, CMA, am acting as scribe for Paulena Servais K, PA-C .   Documentation: I have reviewed the above documentation for accuracy and completeness, and I agree with the above.  Marcellous Snarski K, PA-C

## 2023-11-12 NOTE — Patient Instructions (Addendum)
 Discontinue Clobetasol . Start Triamcinolone  0.1% oint twice daily x 2 weeks. After 2 weeks, switch to Elidel cream twice daily. Continue to alternate every 2 weeks until clear. Once clear, apply Aquaphor daily.  Recommend switching detergent to ALL Free and Clear and discontinue dryer sheets. Recommend changing underwear to the bot short type to decrease the irritation from the elastic   Important Information  Due to recent changes in healthcare laws, you may see results of your pathology and/or laboratory studies on MyChart before the doctors have had a chance to review them. We understand that in some cases there may be results that are confusing or concerning to you. Please understand that not all results are received at the same time and often the doctors may need to interpret multiple results in order to provide you with the best plan of care or course of treatment. Therefore, we ask that you please give us  2 business days to thoroughly review all your results before contacting the office for clarification. Should we see a critical lab result, you will be contacted sooner.   If You Need Anything After Your Visit  If you have any questions or concerns for your doctor, please call our main line at [phone] If no one answers, please leave a voicemail as directed and we will return your call as soon as possible. Messages left after 4 pm will be answered the following business day.   You may also send us  a message via MyChart. We typically respond to MyChart messages within 1-2 business days.  For prescription refills, please ask your pharmacy to contact our office. Our fax number is [phone].  If you have an urgent issue when the clinic is closed that cannot wait until the next business day, you can page your doctor at the number below.    Please note that while we do our best to be available for urgent issues outside of office hours, we are not available 24/7.   If you have an urgent  issue and are unable to reach us , you may choose to seek medical care at your doctor's office, retail clinic, urgent care center, or emergency room.  If you have a medical emergency, please immediately call 911 or go to the emergency department. In the event of inclement weather, please call our main line at [phone] for an update on the status of any delays or closures.  Dermatology Medication Tips: Please keep the boxes that topical medications come in in order to help keep track of the instructions about where and how to use these. Pharmacies typically print the medication instructions only on the boxes and not directly on the medication tubes.   If your medication is too expensive, please contact our office at [phone] or send us  a message through MyChart.   We are unable to tell what your co-pay for medications will be in advance as this is different depending on your insurance coverage. However, we may be able to find a substitute medication at lower cost or fill out paperwork to get insurance to cover a needed medication.   If a prior authorization is required to get your medication covered by your insurance company, please allow us  1-2 business days to complete this process.  Drug prices often vary depending on where the prescription is filled and some pharmacies may offer cheaper prices.  The website www.goodrx.com contains coupons for medications through different pharmacies. The prices here do not account for what the cost may be with help from  insurance (it may be cheaper with your insurance), but the website can give you the price if you did not use any insurance.  - You can print the associated coupon and take it with your prescription to the pharmacy.  - You may also stop by our office during regular business hours and pick up a GoodRx coupon card.  - If you need your prescription sent electronically to a different pharmacy, notify our office through [facility] or  by phone at [phone]

## 2023-11-20 DIAGNOSIS — Z9049 Acquired absence of other specified parts of digestive tract: Secondary | ICD-10-CM | POA: Diagnosis not present

## 2023-11-20 DIAGNOSIS — N281 Cyst of kidney, acquired: Secondary | ICD-10-CM | POA: Diagnosis not present

## 2023-11-20 DIAGNOSIS — K5730 Diverticulosis of large intestine without perforation or abscess without bleeding: Secondary | ICD-10-CM | POA: Diagnosis not present

## 2023-11-20 DIAGNOSIS — K429 Umbilical hernia without obstruction or gangrene: Secondary | ICD-10-CM | POA: Diagnosis not present

## 2023-11-27 DIAGNOSIS — F3341 Major depressive disorder, recurrent, in partial remission: Secondary | ICD-10-CM | POA: Diagnosis not present

## 2023-12-11 DIAGNOSIS — N281 Cyst of kidney, acquired: Secondary | ICD-10-CM | POA: Diagnosis not present

## 2023-12-25 ENCOUNTER — Other Ambulatory Visit (HOSPITAL_BASED_OUTPATIENT_CLINIC_OR_DEPARTMENT_OTHER): Payer: Self-pay | Admitting: Physician Assistant

## 2023-12-25 ENCOUNTER — Ambulatory Visit (HOSPITAL_BASED_OUTPATIENT_CLINIC_OR_DEPARTMENT_OTHER)

## 2023-12-25 ENCOUNTER — Ambulatory Visit (HOSPITAL_BASED_OUTPATIENT_CLINIC_OR_DEPARTMENT_OTHER): Admitting: Orthopaedic Surgery

## 2023-12-25 DIAGNOSIS — M25561 Pain in right knee: Secondary | ICD-10-CM | POA: Diagnosis not present

## 2023-12-25 DIAGNOSIS — M1711 Unilateral primary osteoarthritis, right knee: Secondary | ICD-10-CM | POA: Diagnosis not present

## 2023-12-25 NOTE — Progress Notes (Signed)
 Evaluation    Interval History:   Presents today for right knee pain.  She states that she did have an injury where she fell down several steps previously.  She is experiencing pain about the posterior medial joint line at today's visit.  She does state that this unfortunately does feel quite significant did after her last meniscal root injury.  She is here today for further discussion  PMH/PSH/Family History/Social History/Meds/Allergies:    Past Medical History:  Diagnosis Date   Arthritis    Bunion    left foot   Depression    stable on medication   Diverticulitis 2015   Diverticulitis of colon without hemorrhage 09/28/2013   Diverticulosis    Gallstones    GERD (gastroesophageal reflux disease)    Hammer toes of both feet    Hyperlipidemia    no meds needed   IBS (irritable bowel syndrome)    Lichen sclerosus    Meniscal injury    Tear   Onychomycosis of toenail    Osteopenia    Osteoporosis    PUD (peptic ulcer disease) 1980's   Renal cyst    4.5-6 cm stable single  seen by uro in past   Past Surgical History:  Procedure Laterality Date   CHOLECYSTECTOMY  1987   COLONOSCOPY     IRRIGATION AND DEBRIDEMENT SEBACEOUS CYST Right 3/ 2014   chest wall   KNEE ARTHROSCOPY WITH MENISCAL REPAIR Left 07/30/2022   Procedure: LEFT KNEE ARTHROSCOPY WITH MEDIAL MENISCAL ROOT REPAIR;  Surgeon: Genelle Standing, MD;  Location: [facility];  Service: Orthopedics;  Laterality: Left;   UPPER GASTROINTESTINAL ENDOSCOPY     WISDOM TOOTH EXTRACTION  age 68 & 1   lower teeth done first and upper wisdom teeth last   Social History   Socioeconomic History   Marital status: Divorced    Spouse name: Not on file   Number of children: 0   Years of education: Not on file   Highest education level: Master's degree (e.g., MA, MS, MEng, MEd, MSW, MBA)  Occupational History   Occupation: TEACHER    Employer: [employer]  Tobacco Use    Smoking status: Never   Smokeless tobacco: Never  Vaping Use   Vaping status: Never Used  Substance and Sexual Activity   Alcohol use: No   Drug use: No    Comment: >16, >5   Sexual activity: Not Currently    Partners: Male    Birth control/protection: Post-menopausal  Other Topics Concern   Not on file  Social History Narrative   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   Non smoker    Hh of 1 ... 2 cats    Single   G0P0   Mom is now in assisted living and she has moved into her old residence   Social Drivers of Corporate investment banker Strain: Low Risk  (08/18/2023)   Overall Financial Resource Strain (CARDIA)    Difficulty of Paying Living Expenses: Not hard at all  Food Insecurity: No Food Insecurity (08/18/2023)   Hunger Vital Sign    Worried About Running Out of Food in the Last Year: Never true    Ran Out of Food in the Last Year: Never true  Transportation Needs: No Transportation Needs (08/18/2023)  PRAPARE - Administrator, Civil Service (Medical): No    Lack of Transportation (Non-Medical): No  Physical Activity: Insufficiently Active (08/18/2023)   Exercise Vital Sign    Days of Exercise per Week: 2 days    Minutes of Exercise per Session: 30 min  Stress: No Stress Concern Present (08/18/2023)   [employer] of Occupational Health - Occupational Stress Questionnaire    Feeling of Stress : Only a little  Social Connections: Moderately Integrated (08/18/2023)   Social Connection and Isolation Panel    Frequency of Communication with Friends and Family: More than three times a week    Frequency of Social Gatherings with Friends and Family: Twice a week    Attends Religious Services: More than 4 times per year    Active Member of [employer] or Organizations: Yes    Attends Engineer, structural: More than 4 times per year    Marital Status: Divorced   Family History  Problem Relation Age of Onset   Osteoarthritis  Mother    COPD Mother    Pneumonia Mother    Osteoporosis Mother    Rheum arthritis Father    Pneumonia Father        deceased   Colon cancer Father 12   Heart attack Father    Breast cancer Sister 85   Colon polyps Sister    Cancer Maternal Grandmother        ? GI   Heart failure Maternal Grandfather    Breast cancer Paternal Grandmother 53   Heart failure Paternal Grandfather    Esophageal cancer Neg Hx    Rectal cancer Neg Hx    Stomach cancer Neg Hx    Allergies  Allergen Reactions   Fosamax  [Alendronate  Sodium] Other (See Comments)    Intense pain   Tylenol  [Acetaminophen ] Other (See Comments)    Irritates my esophagus   Current Outpatient Medications  Medication Sig Dispense Refill   amoxicillin -clavulanate (AUGMENTIN ) 875-125 MG tablet Take 1 tablet by mouth every 12 (twelve) hours. 14 tablet 0   Calcium Carb-Cholecalciferol (CALCIUM 600+D3 PO) Take 1 tablet by mouth daily.     clobetasol  ointment (TEMOVATE ) 0.05 % Apply 1 Application topically 2 (two) times daily. Use nightly for vaginal skin irritation related to lichens. 60 g 2   denosumab  (PROLIA ) 60 MG/ML SOSY injection Inject 60 mg into the skin every 6 (six) months.     dextromethorphan-guaiFENesin  (MUCINEX  DM) 30-600 MG 12hr tablet Take 1 tablet by mouth 2 (two) times daily. 20 tablet 0   estradiol  (ESTRACE  VAGINAL) 0.1 MG/GM vaginal cream Place 1 Applicatorful vaginally at bedtime. Rub a quarter size amount twice daily to affected area until next seen for punch biopsy 42.5 g 12   FLUoxetine (PROZAC) 10 MG tablet Take 5 mg by mouth daily.     FLUoxetine (PROZAC) 40 MG capsule Take 40 mg by mouth daily.     fluticasone  (FLONASE ) 50 MCG/ACT nasal spray Place 1 spray into both nostrils daily. 16 mL 0   Multiple Vitamins-Minerals (CENTRUM ULTRA WOMENS PO) Take 1 tablet by mouth every other day.     omeprazole  (PRILOSEC) 20 MG capsule Take 1 capsule (20 mg total) by mouth daily. 30 capsule 5   pimecrolimus  (ELIDEL )  1 % cream Apply topically 2 (two) times daily. twice daily x 2 weeks. After 2 weeks, switch to triamcinolone  0.1% ointment twice daily. Continue to alternate every 2 weeks until clear. 60 g 1   Probiotic Product (PROBIOTIC  PO) Take by mouth daily.     triamcinolone  ointment (KENALOG ) 0.1 % Apply 1 Application topically daily as needed. Apply to affected area twice daily x 2 weeks. After 2 weeks, switch to Elidel  cream twice daily. Continue to alternate every 2 weeks until clear. 60 g 1   No current facility-administered medications for this visit.   No results found.  Review of Systems:   A ROS was performed including pertinent positives and negatives as documented in the HPI.   Musculoskeletal Exam:    Right knee with effusion.  Posterior medial tenderness about the joint line.  Positive effusion with positive McMurray, negative anterior drawer as well as posterior drawer no varus or valgus laxity no erythema or drainage.  Distal neurosensory exam is intact.  Trace swelling about the knee  Imaging:      I personally reviewed and interpreted the radiographs.   Assessment:   With right knee pain consistent with meniscal root tear after an injury positive McMurray now swelling and difficulty placing weight on the knee.  Unfortunately she does have a history of a left knee medial meniscal root tear and given that I did discuss that I would like to obtain an MRI of the right knee so that we can discuss next options I am concerned for possible right knee meniscal root injury  Plan :    - Plan for MRI right knee and follow-up discuss results      I personally saw and evaluated the patient, and participated in the management and treatment plan.  [name] Parker, MD Attending Physician, Orthopedic Surgery  This document was dictated using Dragon voice recognition software. A reasonable attempt at proof reading has been made to minimize errors.

## 2023-12-28 ENCOUNTER — Other Ambulatory Visit

## 2023-12-30 ENCOUNTER — Ambulatory Visit: Attending: Obstetrics and Gynecology | Admitting: Physical Therapy

## 2023-12-30 ENCOUNTER — Encounter: Payer: Self-pay | Admitting: Physical Therapy

## 2023-12-30 DIAGNOSIS — M6281 Muscle weakness (generalized): Secondary | ICD-10-CM | POA: Insufficient documentation

## 2023-12-30 DIAGNOSIS — R278 Other lack of coordination: Secondary | ICD-10-CM | POA: Diagnosis not present

## 2023-12-30 NOTE — Patient Instructions (Signed)
 Bladder Irritants  Certain foods and beverages can be irritating to the bladder.  Avoiding these irritants may decrease your symptoms of urinary urgency, frequency or bladder pain.  Even reducing your intake can help with your symptoms.  Not everyone is sensitive to all bladder irritants, so you may consider focusing on one irritant at a time, removing or reducing your intake of that irritant for 7-10 days to see if this change helps your symptoms.  Water intake is also very important.  Below is a list of bladder irritants.  Drinks: alcohol, carbonated beverages, caffeinated beverages such as coffee and tea, drinks with artificial sweeteners, citrus juices, apple juice, tomato juice  Foods: tomatoes and tomato based foods, spicy food, sugar and artificial sweeteners, vinegar, chocolate, raw onion, apples, citrus fruits, pineapple, cranberries, tomatoes, strawberries, plums, peaches, cantaloupe  Other: acidic urine (too concentrated) - see water intake info below  Substitutes you can try that are NOT irritating to the bladder: cooked onion, pears, papayas, sun-brewed decaf teas, watermelons, non-citrus herbal teas, apricots, kava and low-acid instant drinks (Postum).    WATER INTAKE: Remember to drink lots of water (aim for fluid intake of half your body weight with 2/3 of fluids being water).  You may be limiting fluids due to fear of leakage, but this can actually worsen urgency symptoms due to highly concentrated urine.  Water helps balance the pH of your urine so it doesn't become too acidic - acidic urine is a bladder irritant!  Moisturizers They are used in the vagina to hydrate the mucous membrane that make up the vaginal canal. Designed to keep a more normal acid balance (ph) Ingredients to avoid is glycerin and fragrance, can increase chance of infection   Creams to use externally on the Vulva area Vulva Balm/ V-magic cream by medicine mama- amazon Julva-amazon Vital V Wild Yam  salve ( help moisturize and help with thinning vulvar area, does have Beeswax [employer] Pro-Meno Wild Yam Cream- [employer] Desert Harvest Gele Cleo by Sherrlyn labial moisturizer (Amazon),  aloe Good Clean Love Enchanted Rose by intimate rose  Things to avoid in the vaginal area Do not use things to irritate the vulvar area No lotions just specialized creams for the vulva area- Neogyn, V-magic,  No soaps; can use Aveeno or Calendula cleanser, unscented Dove if needed. Must be gentle No deodorants No douches Good to sleep without underwear to let the vaginal area to air out No scrubbing: spread the lips to let warm water rinse over labias and pat dry   [facility] [address] [place], [place] [address] Phone # [phone] Fax [phone]

## 2023-12-30 NOTE — Therapy (Signed)
 OUTPATIENT PHYSICAL THERAPY FEMALE PELVIC TREATMENT   Patient Name: [name] [name] MRN: [id number] DOB:[birth year], 68 y.o., female Today's Date: 12/30/2023  END OF SESSION:  PT End of Session - 12/30/23 1401     Visit Number 2    Date for PT Re-Evaluation 01/22/24    Authorization Type humana    Authorization Time Period 6/11-9/3    Authorization - Visit Number 2    Authorization - Number of Visits 8    Progress Note Due on Visit 10    PT Start Time 1400    PT Stop Time 1440    PT Time Calculation (min) 40 min    Activity Tolerance Patient tolerated treatment well    Behavior During Therapy [facility] for tasks assessed/performed          Past Medical History:  Diagnosis Date   Arthritis    Bunion    left foot   Depression    stable on medication   Diverticulitis 2015   Diverticulitis of colon without hemorrhage 09/28/2013   Diverticulosis    Gallstones    GERD (gastroesophageal reflux disease)    Hammer toes of both feet    Hyperlipidemia    no meds needed   IBS (irritable bowel syndrome)    Lichen sclerosus    Meniscal injury    Tear   Onychomycosis of toenail    Osteopenia    Osteoporosis    PUD (peptic ulcer disease) 1980's   Renal cyst    4.5-6 cm stable single  seen by uro in past   Past Surgical History:  Procedure Laterality Date   CHOLECYSTECTOMY  1987   COLONOSCOPY     IRRIGATION AND DEBRIDEMENT SEBACEOUS CYST Right 3/ 2014   chest wall   KNEE ARTHROSCOPY WITH MENISCAL REPAIR Left 07/30/2022   Procedure: LEFT KNEE ARTHROSCOPY WITH MEDIAL MENISCAL ROOT REPAIR;  Surgeon: Genelle Standing, MD;  Location: [facility];  Service: Orthopedics;  Laterality: Left;   UPPER GASTROINTESTINAL ENDOSCOPY     WISDOM TOOTH EXTRACTION  age 26 & 38   lower teeth done first and upper wisdom teeth last   Patient Active Problem List   Diagnosis Date Noted   Acute medial meniscus tear of left knee 07/30/2022   Elevated alkaline phosphatase measurement  09/26/2014   Renal cyst, left 12/31/2013   Osteopenia 12/31/2013   Vitamin D  deficiency 12/31/2013   Postmenopausal atrophic vaginitis 12/31/2013   Family history of colon cancer 10/02/2013   Wrist pain, right 05/13/2013   Hx of fall 05/13/2013   Hypercholesteremia 05/13/2013   Musculoskeletal leg pain 02/02/2013   Gait abnormality 02/02/2013   Chest discomfort 05/28/2011   Renal cyst 12/01/2010   Cough 09/27/2010   Neck pain 08/29/2010   Allergic rhinitis, seasonal 08/29/2010   Varicose veins of bilateral lower extremities with other complications 12/06/2008   Brachial neuritis or radiculitis 10/29/2008   HYPERLIPIDEMIA NEC/NOS 01/10/2007   DEPRESSION 01/08/2007   GERD 01/08/2007   Diverticulosis of colon 07/28/2002    PCP: Charlett Apolinar POUR, MD  REFERRING PROVIDER: Zuleta, Kaitlin G, NP   REFERRING DIAG:  R35.1 (ICD-10-CM) - Nocturia  M62.89 (ICD-10-CM) - Pelvic floor dysfunction    THERAPY DIAG:  Muscle weakness (generalized)  Other lack of coordination  Rationale for Evaluation and Treatment: Rehabilitation  ONSET DATE: 12/24  SUBJECTIVE:  SUBJECTIVE STATEMENT: I am getting up 1 time at night. Patient used the AZO and no coloring on the pad. Dermatologist felt I had a dermatitis instead of Lichens. No urinary leakage when I sleep.   Fluid intake: Drinks: 2 cups coffee AM, 1 cup coffee PM, 16oz Unsweetened ice tea with dinner, diet coke, water, stops at 10pm, sleeps around 12am per day   PAIN:  Are you having pain? No  PRECAUTIONS: None  RED FLAGS: None   WEIGHT BEARING RESTRICTIONS: No  FALLS:  Has patient fallen in last 6 months? No  OCCUPATION: volunteer work at [employer]   ACTIVITY LEVEL : walking  PLOF: Independent  PATIENT GOALS: not wake up as much during  the night, strengthen the muscles  PERTINENT HISTORY:  Lichens  Sclerosus; IBS; Osteoporosis; Cholecystectomy;  Sexual abuse: No  BOWEL MOVEMENT: no issues   URINATION: Pain with urination: No Fully empty bladder:Yes,  When urinating, patient feels dribbling after finishing  12/30/23: no dribbling after urination Stream: Strong Urgency: No Frequency: Day time voids 6-8.  Nocturia: 1-2 times per night to void. AS soon as she wakes up she will have to urinate.  Leakage: while asleep Pads: No  INTERCOURSE: not active   PREGNANCY:None   PROLAPSE: None   OBJECTIVE:  Note: Objective measures were completed at Evaluation unless otherwise noted.  DIAGNOSTIC FINDINGS:  Pelvic floor strength II/V   Pelvic floor musculature: Right levator non-tender, Right obturator tender, Left levator non-tender, Left obturator non-tender Post Void Residual 2 mL    PATIENT SURVEYS:  PFIQ-7: 6 UIQ-7: 10  COGNITION: Overall cognitive status: Within functional limits for tasks assessed     SENSATION: Light touch: Appears intact  LUMBAR SPECIAL TESTS:  Trendelenburg sign: Positive both sides  LUMBARAROM/PROM: lumbar ROM is full   LOWER EXTREMITY ROM:  Passive ROM Right eval Left eval  Hip internal rotation 10 10  Hip external rotation 40 50   (Blank rows = not tested)  LOWER EXTREMITY MMT:  MMT Right eval Left eval  Hip extension 4/5 4/5  Hip abduction 4/5 4/5  Hip adduction 4/5 4/5   (Blank rows = not tested) PALPATION:    External Perineal Exam: Lichens sclerosis located on the perineum and rectal area, decreased mobility of the perineal body                             Internal Pelvic Floor: tightness along the sides of the introitus and levator ani  Patient confirms identification and approves PT to assess internal pelvic floor and treatment Yes No emotional/communication barriers or cognitive limitation. Patient is motivated to learn. Patient understands and agrees  with treatment goals and plan. PT explains patient will be examined in standing, sitting, and lying down to see how their muscles and joints work. When they are ready, they will be asked to remove their underwear so PT can examine their perineum. The patient is also given the option of providing their own chaperone as one is not provided in our facility. The patient also has the right and is explained the right to defer or refuse any part of the evaluation or treatment including the internal exam. With the patient's consent, PT will use one gloved finger to gently assess the muscles of the pelvic floor, seeing how well it contracts and relaxes and if there is muscle symmetry. After, the patient will get dressed and PT and patient will discuss exam findings and  plan of care. PT and patient discuss plan of care, schedule, attendance policy and HEP activities.   PELVIC MMT:   MMT eval  Vaginal 2/5  (Blank rows = not tested)        TONE: average  PROLAPSE: none  TODAY'S TREATMENT:         12/30/23 Exercises: Strengthening: Bridge with ball squeeze and pelvic floor contraction 15 x Transverse abdominus contraction 10 x feeling the lower abdominal contract Supine lift opposite arm and leg with abdominal contraction 20 x  Self-care: Educated patient on vaginal health, how to apply the estrogen cream on the vulva, up the canal and along the urethra how to care for the vaginal tissue Education on bladder irritants and how they increase urgency, what is a bladder irritant                                                                                                                         DATE: 10/30/23  EVAL Examination completed, findings reviewed, pt educated on POC, HEP, and female pelvic floor anatomy, reasoning with pelvic floor assessment internally with pt consent. Pt motivated to participate in PT and agreeable to attempt recommendations.     PATIENT EDUCATION:  10/30/23 Education  details: Access Code: [email or web address]; trying AZO or vitamin B to see if there is urine on her underwear due to different color; educated patient on vaginal health Person educated: Patient Education method: Explanation, Demonstration, Tactile cues, Verbal cues, and Handouts Education comprehension: verbalized understanding, returned demonstration, verbal cues required, tactile cues required, and needs further education  HOME EXERCISE PROGRAM: 10/30/23 Access Code: [email or web address] URL: [email or web address] Date: 10/30/2023 Prepared by: Channing Pereyra  Program Notes try AZO or Vitamin B to see if there is urine leakage  Exercises - Seated Pelvic Floor Contraction  - 3 x daily - 7 x weekly - 1 sets - 6 reps - 5 sec hold  ASSESSMENT:  CLINICAL IMPRESSION: Patient is a 68 y.o. female who was seen today for physical therapy evaluation and treatment for nocturia and pelvic floor dysfunction.  Patient still wakes up in the morning having to go to the bathroom due to the urge to urinate. She was educated on vaginal health, how bladder irritants affect the bladder, ways to clean the vagina, and exercises for the pelvic floor. She was not able to attend therapy since 10/30/23 due to having an eye procedure and therapist having minimal openings. Patient will benefit from skilled therapy on care of her vaginal skin and pelvic floor strength to reduce the urinary leakage during the night and irritate the Lichens Sclerosis.   OBJECTIVE IMPAIRMENTS: decreased balance, decreased strength, and increased fascial restrictions.   ACTIVITY LIMITATIONS: sleeping and continence  PARTICIPATION LIMITATIONS: community activity  PERSONAL FACTORS: Age, Fitness, and Time since onset of injury/illness/exacerbation are also affecting patient's functional outcome.   REHAB POTENTIAL: Excellent  CLINICAL DECISION MAKING: Stable/uncomplicated  EVALUATION COMPLEXITY: Low   GOALS: Goals reviewed with patient?  Yes  SHORT  TERM GOALS: Target date: 11/27/23  Patient will be independent with initial HEP for pelvic floor strength to reduce urinary leakage.  Baseline: Goal status: Met 12/30/23  2.  Patient educated on vaginal health to reduce irritation to the [facility] Sclerosis Baseline:  Goal status: Met 12/30/23  3.  Patient will be able to walk to the bathroom slowly when she wakes up first thing in the morning to reduce chance of falls.  Baseline:  Goal status: Met 12/30/23    LONG TERM GOALS: Target date: 01/22/24  Patient is independent with advanced HEP for pelvic floor and core strength to reduce urinary leakage during the night to reduce the irritation to the vaginal area.  Baseline:  Goal status: INITIAL  2.  Pelvic floor strength >/= 3/5 to reduce the urinary leakage during the night.  Baseline:  Goal status: INITIAL  3.  Patient understands how to perform manual work to the area of Lichens Sclerosis to  keep the tissue mobility and reduce scaring.  Baseline:  Goal status: INITIAL    PLAN:  PT FREQUENCY: 1-2x/week  PT DURATION: 12 weeks  PLANNED INTERVENTIONS: 97110-Therapeutic exercises, 97530- Therapeutic activity, 97112- Neuromuscular re-education, 97535- Self Care, [id number]- Manual therapy, Patient/Family education, and Biofeedback  PLAN FOR NEXT SESSION: manual work to the pelvic floor, education on how to mobilize the tissue, progress pelvic floor strength exercises   Channing Pereyra, PT 12/30/23 2:42 PM

## 2024-01-02 ENCOUNTER — Ambulatory Visit: Admitting: Obstetrics and Gynecology

## 2024-01-02 ENCOUNTER — Ambulatory Visit: Payer: Self-pay

## 2024-01-02 VITALS — BP 113/66 | HR 76

## 2024-01-02 DIAGNOSIS — M6289 Other specified disorders of muscle: Secondary | ICD-10-CM

## 2024-01-02 DIAGNOSIS — R351 Nocturia: Secondary | ICD-10-CM | POA: Diagnosis not present

## 2024-01-02 DIAGNOSIS — N952 Postmenopausal atrophic vaginitis: Secondary | ICD-10-CM | POA: Diagnosis not present

## 2024-01-02 NOTE — Telephone Encounter (Signed)
 FYI Only or Action Required?: FYI only for provider.  Patient was last seen in primary care on 10/16/2023 by Merna Huxley, NP.  Called Nurse Triage reporting Abdominal Pain.  Symptoms began a week ago.  Interventions attempted: Nothing.  Symptoms are: unchanged.  Triage Disposition: See HCP Within 24 Hours (Or PCP Triage)  Patient/caregiver understands and will follow disposition?: Yes  Hx diverticulitis  Copied from CRM #[id number]. Topic: Clinical - Red Word Triage >> Jan 02, 2024  9:55 AM Thersia BROCKS wrote: [facility] that prompted transfer to Nurse Triage: lower abdominal pain/ stomach pain that she thinks its related to diverticulitis Reason for Disposition  [1] MILD-MODERATE pain AND [2] constant AND [3] age > 60 years  Answer Assessment - Initial Assessment Questions 1. LOCATION: Where does it hurt?      Lower abd pain, hx divertic, states flare up in June. 2. RADIATION: Does the pain shoot anywhere else? (e.g., chest, back)     no 3. ONSET: When did the pain begin? (e.g., minutes, hours or days ago)      On and off this week 4. SUDDEN: Gradual or sudden onset?     suddenly 5. PATTERN Does the pain come and go, or is it constant?     Constant at this time 6. SEVERITY: How bad is the pain?  (e.g., Scale 1-10; mild, moderate, or severe)     moderate 7. RECURRENT SYMPTOM: Have you ever had this type of stomach pain before? If Yes, ask: When was the last time? and What happened that time?      yes 8. CAUSE: What do you think is causing the stomach pain? (e.g., gallstones, recent abdominal surgery)     divertic 9. RELIEVING/AGGRAVATING FACTORS: What makes it better or worse? (e.g., antacids, bending or twisting motion, bowel movement)     no 10. OTHER SYMPTOMS: Do you have any other symptoms? (e.g., back pain, diarrhea, fever, urination pain, vomiting)       denies 11. PREGNANCY: Is there any chance you are pregnant? When was your last menstrual  period?       na  Protocols used: Abdominal Pain - Female-A-AH

## 2024-01-02 NOTE — Progress Notes (Signed)
 [facility] Return Visit  SUBJECTIVE  History of Present Illness: [name] [name] is a 68 y.o. female seen in follow-up for OAB/Nocturia. Plan at last visit was start vaginal estrogen for atrophy, do pelvic floor Pt.  Patient has done 2 sessions of pelvic floor PT and she feels like this is going to be helpful but she is having some pain in the same region as she has diverticular pain after PT.      Past Medical History: Patient  has a past medical history of Arthritis, Bunion, Depression, Diverticulitis (2015), Diverticulitis of colon without hemorrhage (09/28/2013), Diverticulosis, Gallstones, GERD (gastroesophageal reflux disease), Hammer toes of both feet, Hyperlipidemia, IBS (irritable bowel syndrome), Lichen sclerosus, Meniscal injury, Onychomycosis of toenail, Osteopenia, Osteoporosis, PUD (peptic ulcer disease) (1980's), and Renal cyst.   Past Surgical History: She  has a past surgical history that includes Cholecystectomy (1987); Wisdom tooth extraction (age 64 & 27); Irrigation and debridement sabaceous cyst (Right, 3/ 2014); Colonoscopy; Upper gastrointestinal endoscopy; and Knee arthroscopy with meniscal repair (Left, 07/30/2022).   Medications: She has a current medication list which includes the following prescription(s): amoxicillin -clavulanate, calcium carb-cholecalciferol, clobetasol  ointment, denosumab , dextromethorphan-guaifenesin , estradiol , fluoxetine, fluoxetine, fluticasone , multiple vitamins-minerals, omeprazole , pimecrolimus , probiotic product, and triamcinolone  ointment.   Allergies: Patient is allergic to fosamax  [alendronate  sodium] and tylenol  [acetaminophen ].   Social History: Patient  reports that she has never smoked. She has never used smokeless tobacco. She reports that she does not drink alcohol and does not use drugs.     OBJECTIVE     Physical Exam: Vitals:   01/02/24 1420  BP: 113/66  Pulse: 76   Gen: No apparent distress, A&O x  3.  Detailed Urogynecologic Evaluation:  Deferred.    ASSESSMENT AND PLAN    Ms. Sweetland is a 68 y.o. with:  1. Nocturia   2. Vaginal atrophy   3. Pelvic floor dysfunction     Patient to continue pelvic floor PT. She is estimated to be done in September and we can follow up in October to see if she needs a formal medication.  Continue estrogen cream x2 weekly.  We discussed if she feels she needs a muscle relaxer after her therapy appointment that this is an options for her. She will let me know if she feels she needs this.  Patient to follow up in October or sooner if needed   Rether Rison G Weronika Birch, NP

## 2024-01-03 ENCOUNTER — Encounter: Payer: Self-pay | Admitting: Family Medicine

## 2024-01-03 ENCOUNTER — Ambulatory Visit: Admitting: Family Medicine

## 2024-01-03 VITALS — BP 104/6 | HR 78 | Temp 98.9°F | Ht 64.5 in | Wt 178.1 lb

## 2024-01-03 DIAGNOSIS — K5792 Diverticulitis of intestine, part unspecified, without perforation or abscess without bleeding: Secondary | ICD-10-CM | POA: Diagnosis not present

## 2024-01-03 MED ORDER — CIPROFLOXACIN HCL 500 MG PO TABS: 500.0000 mg | ORAL_TABLET | Freq: Two times a day (BID) | ORAL | 0 refills | Status: AC

## 2024-01-03 MED ORDER — METRONIDAZOLE 500 MG PO TABS: 500.0000 mg | ORAL_TABLET | Freq: Two times a day (BID) | ORAL | 0 refills | Status: AC

## 2024-01-03 NOTE — Progress Notes (Signed)
 Acute Office Visit  Subjective:     Patient ID: [name] [name], female    DOB: 21-Dec-1955, 68 y.o.   MRN: [id number]  Chief Complaint  Patient presents with   Abdominal Pain    Patient complains of pelvic pain x4 days, concerned due to history of diverticulitis, seen at an urgent care in June for flare-up, given antibitoics   Diarrhea    Abdominal Pain Associated symptoms include diarrhea. Pertinent negatives include no fever.  Diarrhea  Associated symptoms include abdominal pain. Pertinent negatives include no chills or fever.   Patient is in today for pelvic pain for 4 days, states that she has been undergoing pelvic floor PT, and the pain occurs after her sessions. States this happened to her in the past, was seen in June at [facility] for similar symptoms and was given augmentin  which helped with her pain. Labs at that time were WNL. She denies fever/chills, no nausea or vomiting, there has been some diarrhea but no bleeding in the stool, states that when she turns onto her left side it hurts more. Also hurts to sit sometimes as well. Sometimes it is a dull ache and sometimes kind of crampy, states she had more cramping this morning at breakfast. States she had a bad episode of diarrhea a couple of nights ago and had more pain with that.   Colonocsopy 2/25 confirms diverticulosis of the left colon.   Review of Systems  Constitutional:  Negative for chills and fever.  Gastrointestinal:  Positive for abdominal pain and diarrhea.        Objective:    BP (!) 104/6   Pulse 78   Temp 98.9 F (37.2 C) (Oral)   Ht 5' 4.5 (1.638 m)   Wt 178 lb 1.6 oz (80.8 kg)   LMP 11/18/2004   SpO2 96%   BMI 30.10 kg/m    Physical Exam Constitutional:      Appearance: She is well-developed and overweight.  Cardiovascular:     Rate and Rhythm: Normal rate and regular rhythm.     Heart sounds: Normal heart sounds.  Pulmonary:     Effort: Pulmonary effort is normal.  Abdominal:      General: Bowel sounds are normal.     Palpations: Abdomen is soft.     Tenderness: There is abdominal tenderness in the left lower quadrant. There is no guarding or rebound.  Neurological:     Mental Status: She is alert and oriented to person, place, and time.     No results found for any visits on 01/03/24.      Assessment & Plan:   Problem List Items Addressed This Visit   None Visit Diagnoses       Acute diverticulitis    -  Primary   Relevant Medications   ciprofloxacin  (CIPRO ) 500 MG tablet   metroNIDAZOLE  (FLAGYL ) 500 MG tablet     Pt has point tenderness in the LLQ, colonoscopy reviewed with patient. Will treat this time with cipro  and flagyl  to calm the inflammation. I advised that if she has another episode then further imaging studies would need to be obtained. Handouts given on the diverticulitis/diverticulosis diets.   Meds ordered this encounter  Medications   ciprofloxacin  (CIPRO ) 500 MG tablet    Sig: Take 1 tablet (500 mg total) by mouth 2 (two) times daily for 7 days.    Dispense:  14 tablet    Refill:  0   metroNIDAZOLE  (FLAGYL ) 500 MG tablet  Sig: Take 1 tablet (500 mg total) by mouth 2 (two) times daily for 7 days.    Dispense:  14 tablet    Refill:  0    No follow-ups on file.  Heron CHRISTELLA Sharper, MD

## 2024-01-08 ENCOUNTER — Ambulatory Visit: Admitting: Physician Assistant

## 2024-01-08 ENCOUNTER — Encounter: Payer: Self-pay | Admitting: Physician Assistant

## 2024-01-08 VITALS — BP 134/63

## 2024-01-08 DIAGNOSIS — L249 Irritant contact dermatitis, unspecified cause: Secondary | ICD-10-CM | POA: Diagnosis not present

## 2024-01-08 DIAGNOSIS — L219 Seborrheic dermatitis, unspecified: Secondary | ICD-10-CM

## 2024-01-08 NOTE — Progress Notes (Signed)
   Follow-Up Visit   Subjective  [name] [name] is a 68 y.o. female who presents for the following: Follow up of Irritant dermatitis follow up - She states she is doing much better. She is alternating triamcinolone  and Elidel  every 2 weeks.  She has an itchy spot on her post scalp. It has been there for about a month.   The following portions of the chart were reviewed this encounter and updated as appropriate: medications, allergies, medical history  Review of Systems:  No other skin or systemic complaints except as noted in HPI or Assessment and Plan.  Objective  Well appearing patient in no apparent distress; mood and affect are within normal limits.  Exam of scalp and genitalia area was performed.   Relevant exam findings are noted in the Assessment and Plan.    Assessment & Plan   SEBORRHEIC DERMATITIS - MINIMAL  Exam: Pink scaly patch   Seborrheic Dermatitis is a chronic persistent rash characterized by pinkness and scaling most commonly of the mid face but also can occur on the scalp (dandruff), ears; mid chest, mid back and groin.  It tends to be exacerbated by stress and cooler weather.  People who have neurologic disease may experience new onset or exacerbation of existing seborrheic dermatitis.  The condition is not curable but treatable and can be controlled.  Treatment Plan: TMC 0.1% ointment twice daily to affected area until clear. May use clobetasol  if [facility] is not helping. She has both medications at home.  IRRITANT CONTACT DERMATITIS -- IMPROVED  Exam: Minimal erythema  Treatment Plan: May continue TMC 0.1% oint 2-3 times per week as needed for itch. Continue Elidel  once daily. May mix with Aquaphor.  IRRITANT CONTACT DERMATITIS, UNSPECIFIED TRIGGER   SEBORRHEIC DERMATITIS    Return in about 6 months (around 07/10/2024) for irritant dermatitis follow up.  I, Roseline Hutchinson, CMA, am acting as scribe for Dewanna Hurston K, PA-C .   Documentation: I have  reviewed the above documentation for accuracy and completeness, and I agree with the above.  Jameya Pontiff K, PA-C

## 2024-01-08 NOTE — Patient Instructions (Signed)

## 2024-01-10 ENCOUNTER — Encounter (HOSPITAL_BASED_OUTPATIENT_CLINIC_OR_DEPARTMENT_OTHER): Payer: Self-pay | Admitting: Orthopaedic Surgery

## 2024-01-13 ENCOUNTER — Encounter: Admitting: Physical Therapy

## 2024-01-13 ENCOUNTER — Ambulatory Visit
Admission: RE | Admit: 2024-01-13 | Discharge: 2024-01-13 | Disposition: A | Discharge status: Home / Self Care | Source: Ambulatory Visit | Attending: Orthopaedic Surgery

## 2024-01-13 DIAGNOSIS — S83241A Other tear of medial meniscus, current injury, right knee, initial encounter: Secondary | ICD-10-CM | POA: Diagnosis not present

## 2024-01-13 DIAGNOSIS — M25561 Pain in right knee: Secondary | ICD-10-CM

## 2024-01-15 DIAGNOSIS — H16233 Neurotrophic keratoconjunctivitis, bilateral: Secondary | ICD-10-CM | POA: Diagnosis not present

## 2024-01-22 ENCOUNTER — Ambulatory Visit (HOSPITAL_BASED_OUTPATIENT_CLINIC_OR_DEPARTMENT_OTHER): Admitting: Orthopaedic Surgery

## 2024-01-22 DIAGNOSIS — M25561 Pain in right knee: Secondary | ICD-10-CM | POA: Diagnosis not present

## 2024-01-22 NOTE — Progress Notes (Signed)
 Evaluation    Interval History:   01/22/2024: Presents today for follow-up and MRI discussion of the right knee.  Overall her symptoms have improved somewhat  Presents today for right knee pain.  She states that she did have an injury where she fell down several steps previously.  She is experiencing pain about the posterior medial joint line at today's visit.  She does state that this unfortunately does feel quite significant did after her last meniscal root injury.  She is here today for further discussion  PMH/PSH/Family History/Social History/Meds/Allergies:    Past Medical History:  Diagnosis Date   Arthritis    Bunion    left foot   Depression    stable on medication   Diverticulitis 2015   Diverticulitis of colon without hemorrhage 09/28/2013   Diverticulosis    Gallstones    GERD (gastroesophageal reflux disease)    Hammer toes of both feet    Hyperlipidemia    no meds needed   IBS (irritable bowel syndrome)    Lichen sclerosus    Meniscal injury    Tear   Onychomycosis of toenail    Osteopenia    Osteoporosis    PUD (peptic ulcer disease) 1980's   Renal cyst    4.5-6 cm stable single  seen by uro in past   Past Surgical History:  Procedure Laterality Date   CHOLECYSTECTOMY  1987   COLONOSCOPY     IRRIGATION AND DEBRIDEMENT SEBACEOUS CYST Right 3/ 2014   chest wall   KNEE ARTHROSCOPY WITH MENISCAL REPAIR Left 07/30/2022   Procedure: LEFT KNEE ARTHROSCOPY WITH MEDIAL MENISCAL ROOT REPAIR;  Surgeon: Genelle Standing, MD;  Location:  [facility];  Service: Orthopedics;  Laterality: Left;   UPPER GASTROINTESTINAL ENDOSCOPY     WISDOM TOOTH EXTRACTION  age 16 & 49   lower teeth done first and upper wisdom teeth last   Social History   Socioeconomic History   Marital status: Divorced    Spouse name: Not on file   Number of children: 0   Years of education: Not on file   Highest education level: Master's degree  (e.g., MA, MS, MEng, MEd, MSW, MBA)  Occupational History   Occupation: TEACHER    Employer: [employer]  Tobacco Use   Smoking status: Never   Smokeless tobacco: Never  Vaping Use   Vaping status: Never Used  Substance and Sexual Activity   Alcohol use: No   Drug use: No    Comment: >16, >5   Sexual activity: Not Currently    Partners: Male    Birth control/protection: Post-menopausal  Other Topics Concern   Not on file  Social History Narrative   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   Non smoker    Hh of 1 ... 2 cats    Single   G0P0   Mom is now in assisted living and she has moved into her old residence   Social Drivers of Corporate investment banker Strain: Low Risk  (01/02/2024)   Overall Financial Resource Strain (CARDIA)    Difficulty of Paying Living Expenses: Not hard at all  Food Insecurity: No Food Insecurity (01/02/2024)   Hunger Vital Sign    Worried About Running Out of Food in the Last Year: Never true  Ran Out of Food in the Last Year: Never true  Transportation Needs: No Transportation Needs (01/02/2024)   PRAPARE - Administrator, Civil Service (Medical): No    Lack of Transportation (Non-Medical): No  Physical Activity: Insufficiently Active (01/02/2024)   Exercise Vital Sign    Days of Exercise per Week: 2 days    Minutes of Exercise per Session: 30 min  Stress: No Stress Concern Present (01/02/2024)   [employer] of Occupational Health - Occupational Stress Questionnaire    Feeling of Stress: Only a little  Social Connections: Moderately Integrated (01/02/2024)   Social Connection and Isolation Panel    Frequency of Communication with Friends and Family: More than three times a week    Frequency of Social Gatherings with Friends and Family: Three times a week    Attends Religious Services: More than 4 times per year    Active Member of Clubs or Organizations: Yes    Attends Hospital doctor: More than 4 times per year    Marital Status: Divorced   Family History  Problem Relation Age of Onset   Osteoarthritis Mother    COPD Mother    Pneumonia Mother    Osteoporosis Mother    Rheum arthritis Father    Pneumonia Father        deceased   Colon cancer Father 58   Heart attack Father    Breast cancer Sister 18   Colon polyps Sister    Cancer Maternal Grandmother        ? GI   Heart failure Maternal Grandfather    Breast cancer Paternal Grandmother 29   Heart failure Paternal Grandfather    Esophageal cancer Neg Hx    Rectal cancer Neg Hx    Stomach cancer Neg Hx    Allergies  Allergen Reactions   Fosamax  [Alendronate  Sodium] Other (See Comments)    Intense pain   Tylenol  [Acetaminophen ] Other (See Comments)    Irritates my esophagus   Current Outpatient Medications  Medication Sig Dispense Refill   Calcium Carb-Cholecalciferol (CALCIUM 600+D3 PO) Take 1 tablet by mouth daily.     clobetasol  ointment (TEMOVATE ) 0.05 % Apply 1 Application topically 2 (two) times daily. Use nightly for vaginal skin irritation related to lichens. 60 g 2   denosumab  (PROLIA ) 60 MG/ML SOSY injection Inject 60 mg into the skin every 6 (six) months.     dextromethorphan-guaiFENesin  (MUCINEX  DM) 30-600 MG 12hr tablet Take 1 tablet by mouth 2 (two) times daily. 20 tablet 0   estradiol  (ESTRACE  VAGINAL) 0.1 MG/GM vaginal cream Place 1 Applicatorful vaginally at bedtime. Rub a quarter size amount twice daily to affected area until next seen for punch biopsy 42.5 g 12   FLUoxetine (PROZAC) 10 MG tablet Take 5 mg by mouth daily.     FLUoxetine (PROZAC) 40 MG capsule Take 40 mg by mouth daily.     fluticasone  (FLONASE ) 50 MCG/ACT nasal spray Place 1 spray into both nostrils daily. 16 mL 0   Multiple Vitamins-Minerals (CENTRUM ULTRA WOMENS PO) Take 1 tablet by mouth every other day.     omeprazole  (PRILOSEC) 20 MG capsule Take 1 capsule (20 mg total) by mouth daily. 30 capsule 5    pimecrolimus  (ELIDEL ) 1 % cream Apply topically 2 (two) times daily. twice daily x 2 weeks. After 2 weeks, switch to triamcinolone  0.1% ointment twice daily. Continue to alternate every 2 weeks until clear. 60 g 1   Probiotic Product (PROBIOTIC  PO) Take by mouth daily.     triamcinolone  ointment (KENALOG ) 0.1 % Apply 1 Application topically daily as needed. Apply to affected area twice daily x 2 weeks. After 2 weeks, switch to Elidel  cream twice daily. Continue to alternate every 2 weeks until clear. 60 g 1   No current facility-administered medications for this visit.   No results found.  Review of Systems:   A ROS was performed including pertinent positives and negatives as documented in the HPI.   Musculoskeletal Exam:    Right knee with effusion.  Posterior medial tenderness about the joint line.  Positive effusion with positive McMurray, negative anterior drawer as well as posterior drawer no varus or valgus laxity no erythema or drainage.  Distal neurosensory exam is intact.  Trace swelling about the knee  Imaging:      I personally reviewed and interpreted the radiographs.   Assessment:   With right knee pain with osteoarthritis of the medial joint space but no obvious meniscal root injury.  Given this and the fact that her pain is relieved I described that I believe her prognosis is overall good.  We could provide an additional steroid injection as needed.  She would like to defer this for now.  I will plan to see her back as needed  Plan :    - Return to clinic as needed     I personally saw and evaluated the patient, and participated in the management and treatment plan.  Elspeth Parker, MD Attending Physician, Orthopedic Surgery  This document was dictated using Dragon voice recognition software. A reasonable attempt at proof reading has been made to minimize errors.

## 2024-02-05 ENCOUNTER — Ambulatory Visit: Attending: Obstetrics and Gynecology | Admitting: Physical Therapy

## 2024-02-05 ENCOUNTER — Encounter: Payer: Self-pay | Admitting: Physical Therapy

## 2024-02-05 DIAGNOSIS — R278 Other lack of coordination: Secondary | ICD-10-CM | POA: Insufficient documentation

## 2024-02-05 DIAGNOSIS — M6281 Muscle weakness (generalized): Secondary | ICD-10-CM | POA: Diagnosis not present

## 2024-02-05 NOTE — Therapy (Signed)
 OUTPATIENT PHYSICAL THERAPY FEMALE PELVIC TREATMENT   Patient Name: [name] [name] MRN: [id number] DOB:January 01, 1956, 68 y.o., female Today's Date: 02/05/2024  END OF SESSION:  PT End of Session - 02/05/24 1449     Visit Number 3    Date for PT Re-Evaluation 04/15/24    Authorization Type humana    Authorization - Visit Number 3    Authorization - Number of Visits 8    Progress Note Due on Visit 10    PT Start Time 1445    PT Stop Time 1525    PT Time Calculation (min) 40 min    Activity Tolerance Patient tolerated treatment well    Behavior During Therapy [facility] for tasks assessed/performed          Past Medical History:  Diagnosis Date   Arthritis    Bunion    left foot   Depression    stable on medication   Diverticulitis 2015   Diverticulitis of colon without hemorrhage 09/28/2013   Diverticulosis    Gallstones    GERD (gastroesophageal reflux disease)    Hammer toes of both feet    Hyperlipidemia    no meds needed   IBS (irritable bowel syndrome)    Lichen sclerosus    Meniscal injury    Tear   Onychomycosis of toenail    Osteopenia    Osteoporosis    PUD (peptic ulcer disease) 1980's   Renal cyst    4.5-6 cm stable single  seen by uro in past   Past Surgical History:  Procedure Laterality Date   CHOLECYSTECTOMY  1987   COLONOSCOPY     IRRIGATION AND DEBRIDEMENT SEBACEOUS CYST Right 3/ 2014   chest wall   KNEE ARTHROSCOPY WITH MENISCAL REPAIR Left 07/30/2022   Procedure: LEFT KNEE ARTHROSCOPY WITH MEDIAL MENISCAL ROOT REPAIR;  Surgeon: Genelle Standing, MD;  Location: [facility];  Service: Orthopedics;  Laterality: Left;   UPPER GASTROINTESTINAL ENDOSCOPY     WISDOM TOOTH EXTRACTION  age 8 & 83   lower teeth done first and upper wisdom teeth last   Patient Active Problem List   Diagnosis Date Noted   Acute medial meniscus tear of left knee 07/30/2022   Elevated alkaline phosphatase measurement 09/26/2014   Renal cyst, left 12/31/2013    Osteopenia 12/31/2013   Vitamin D  deficiency 12/31/2013   Postmenopausal atrophic vaginitis 12/31/2013   Family history of colon cancer 10/02/2013   Wrist pain, right 05/13/2013   Hx of fall 05/13/2013   Hypercholesteremia 05/13/2013   Musculoskeletal leg pain 02/02/2013   Gait abnormality 02/02/2013   Chest discomfort 05/28/2011   Renal cyst 12/01/2010   Cough 09/27/2010   Neck pain 08/29/2010   Allergic rhinitis, seasonal 08/29/2010   Varicose veins of bilateral lower extremities with other complications 12/06/2008   Brachial neuritis or radiculitis 10/29/2008   HYPERLIPIDEMIA NEC/NOS 01/10/2007   DEPRESSION 01/08/2007   GERD 01/08/2007   Diverticulosis of colon 07/28/2002    PCP: Charlett Apolinar POUR, MD  REFERRING PROVIDER: Zuleta, Kaitlin G, NP   REFERRING DIAG:  R35.1 (ICD-10-CM) - Nocturia  M62.89 (ICD-10-CM) - Pelvic floor dysfunction    THERAPY DIAG:  Muscle weakness (generalized) - Plan: PT plan of care cert/re-cert  Other lack of coordination - Plan: PT plan of care cert/re-cert  Rationale for Evaluation and Treatment: Rehabilitation  ONSET DATE: 12/24  SUBJECTIVE:  SUBJECTIVE STATEMENT: I am getting up 1 time at night. Patient used the AZO and no coloring on the pad. Dermatologist felt I had a dermatitis instead of Lichens. No urinary leakage when I sleep.   Fluid intake: Drinks: 2 cups coffee AM, 1 cup coffee PM, 16oz Unsweetened ice tea with dinner, diet coke, water, stops at 10pm, sleeps around 12am per day   PAIN:  Are you having pain? No  PRECAUTIONS: None  RED FLAGS: None   WEIGHT BEARING RESTRICTIONS: No  FALLS:  Has patient fallen in last 6 months? No  OCCUPATION: volunteer work at [employer]   ACTIVITY LEVEL : walking  PLOF: Independent  PATIENT  GOALS: not wake up as much during the night, strengthen the muscles  PERTINENT HISTORY:  Lichens  Sclerosus; IBS; Osteoporosis; Cholecystectomy;  Sexual abuse: No  BOWEL MOVEMENT: no issues   URINATION: Pain with urination: No Fully empty bladder:Yes,  When urinating, patient feels dribbling after finishing  12/30/23: no dribbling after urination Stream: Strong Urgency: No Frequency: Day time voids 6-8.  Nocturia: 1-2 times per night to void. AS soon as she wakes up she will have to urinate.  Leakage: while asleep Pads: No  INTERCOURSE: not active   PREGNANCY:None   PROLAPSE: None   OBJECTIVE:  Note: Objective measures were completed at Evaluation unless otherwise noted.  DIAGNOSTIC FINDINGS:  Pelvic floor strength II/V   Pelvic floor musculature: Right levator non-tender, Right obturator tender, Left levator non-tender, Left obturator non-tender Post Void Residual 2 mL    PATIENT SURVEYS:  PFIQ-7: 6 UIQ-7: 10 02/05/24: PFIQ-7: 6 UIQ-7: 10  COGNITION: Overall cognitive status: Within functional limits for tasks assessed     SENSATION: Light touch: Appears intact  LUMBAR SPECIAL TESTS:  Trendelenburg sign: Positive both sides  LUMBARAROM/PROM: lumbar ROM is full   LOWER EXTREMITY ROM:  Passive ROM Right eval Left eval Right 02/05/24 Left 02/05/24  Hip internal rotation 10 10 10 10   Hip external rotation 40 50 55 50   (Blank rows = not tested)  LOWER EXTREMITY MMT:  MMT Right eval Left eval Right/Left 02/05/24  Hip extension 4/5 4/5 4/5  Hip abduction 4/5 4/5 5/5  Hip adduction 4/5 4/5 5/5   (Blank rows = not tested) PALPATION:    External Perineal Exam: Lichens sclerosis located on the perineum and rectal area, decreased mobility of the perineal body                             Internal Pelvic Floor: tightness along the sides of the introitus and levator ani  Patient confirms identification and approves PT to assess internal pelvic floor and  treatment Yes No emotional/communication barriers or cognitive limitation. Patient is motivated to learn. Patient understands and agrees with treatment goals and plan. PT explains patient will be examined in standing, sitting, and lying down to see how their muscles and joints work. When they are ready, they will be asked to remove their underwear so PT can examine their perineum. The patient is also given the option of providing their own chaperone as one is not provided in our facility. The patient also has the right and is explained the right to defer or refuse any part of the evaluation or treatment including the internal exam. With the patient's consent, PT will use one gloved finger to gently assess the muscles of the pelvic floor, seeing how well it contracts and relaxes and if there  is muscle symmetry. After, the patient will get dressed and PT and patient will discuss exam findings and plan of care. PT and patient discuss plan of care, schedule, attendance policy and HEP activities.   PELVIC MMT:   MMT eval 02/05/24  Vaginal 2/5 3/5  (Blank rows = not tested)        TONE: average  PROLAPSE: none  TODAY'S TREATMENT:     02/05/24 Manual: Internal pelvic floor techniques: No emotional/communication barriers or cognitive limitation. Patient is motivated to learn. Patient understands and agrees with treatment goals and plan. PT explains patient will be examined in standing, sitting, and lying down to see how their muscles and joints work. When they are ready, they will be asked to remove their underwear so PT can examine their perineum. The patient is also given the option of providing their own chaperone as one is not provided in our facility. The patient also has the right and is explained the right to defer or refuse any part of the evaluation or treatment including the internal exam. With the patient's consent, PT will use one gloved finger to gently assess the muscles of the pelvic floor,  seeing how well it contracts and relaxes and if there is muscle symmetry. After, the patient will get dressed and PT and patient will discuss exam findings and plan of care. PT and patient discuss plan of care, schedule, attendance policy and HEP activities.  Therapist gloved finger in the vaginal canal working on the levator ani, obturator internist, perineal body and posterior vaginal canal Neuromuscular re-education: Core facilitation: Transverse abdominus contraction with breathing out 10 x Supine abdominal contraction with lifting opposite arm and leg 20 x  Pelvic floor contraction training: Therapist gloved finger in the vaginal canal working on pelvic floor contraction giving verbal and tactile cues Therapist will clear her throat and push down on pelvic floor and therapist giving tactile cues to contract with clearing her throat Self-care: Educated patient on vaginal moisturizers to improve the irritation of the vulvar area and improve the health of her tissue        12/30/23 Exercises: Strengthening: Bridge with ball squeeze and pelvic floor contraction 15 x Transverse abdominus contraction 10 x feeling the lower abdominal contract Supine lift opposite arm and leg with abdominal contraction 20 x  Self-care: Educated patient on vaginal health, how to apply the estrogen cream on the vulva, up the canal and along the urethra how to care for the vaginal tissue Education on bladder irritants and how they increase urgency, what is a bladder irritant                                                                                                                         DATE: 10/30/23  EVAL Examination completed, findings reviewed, pt educated on POC, HEP, and female pelvic floor anatomy, reasoning with pelvic floor assessment internally with pt consent. Pt motivated to participate in PT and agreeable to attempt recommendations.  PATIENT EDUCATION:  02/05/24 Education details: Access  Code: [email or web address];educated patient on vaginal moisturizers Person educated: Patient Education method: Explanation, Demonstration, Tactile cues, Verbal cues, and Handouts Education comprehension: verbalized understanding, returned demonstration, verbal cues required, tactile cues required, and needs further education  HOME EXERCISE PROGRAM: 02/05/24 Access Code: [email or web address] URL: [email or web address] Date: 02/05/2024 Prepared by: Channing Pereyra  Exercises - Seated Pelvic Floor Contraction  - 3 x daily - 7 x weekly - 1 sets - 6 reps - 5 sec hold - Supine Bridge with Mini Swiss Ball Between Knees  - 1 x daily - 3 x weekly - 1 sets - 15 reps - Hooklying Transversus Abdominis Palpation  - 1 x daily - 3 x weekly - 1 sets - 10 reps - Dead Bug  - 1 x daily - 3 x weekly - 2 sets - 10 reps  ASSESSMENT:  CLINICAL IMPRESSION: Patient is a 68 y.o. female who was seen today for physical therapy evaluation and treatment for nocturia and pelvic floor dysfunction.  Patient has not been therapy due to having an eye procedure and multiple doctor appointments for it. Some nights she is not leaking urine.  No dribbling after urinating.  Urinary leakage is 80% better. Pelvic floor strength is 3/5 compared to 2/5. Patient vulvar is red and irritated so she is educated on using vaginal moisturizers frequently to reduce it. Patient will bear down when she clears her throat. Patient will benefit from skilled therapy on care of her vaginal skin and pelvic floor strength to reduce the urinary leakage during the night and irritate the Lichens Sclerosis.   OBJECTIVE IMPAIRMENTS: decreased balance, decreased strength, and increased fascial restrictions.   ACTIVITY LIMITATIONS: sleeping and continence  PARTICIPATION LIMITATIONS: community activity  PERSONAL FACTORS: Age, Fitness, and Time since onset of injury/illness/exacerbation are also affecting patient's functional outcome.   REHAB POTENTIAL:  Excellent  CLINICAL DECISION MAKING: Stable/uncomplicated  EVALUATION COMPLEXITY: Low   GOALS: Goals reviewed with patient? Yes  SHORT TERM GOALS: Target date: 11/27/23  Patient will be independent with initial HEP for pelvic floor strength to reduce urinary leakage.  Baseline: Goal status: Met 12/30/23  2.  Patient educated on vaginal health to reduce irritation to the [facility] Sclerosis Baseline:  Goal status: Met 12/30/23  3.  Patient will be able to walk to the bathroom slowly when she wakes up first thing in the morning to reduce chance of falls.  Baseline:  Goal status: Met 12/30/23    LONG TERM GOALS: Target date: 04/15/24  Patient is independent with advanced HEP for pelvic floor and core strength to reduce urinary leakage during the night to reduce the irritation to the vaginal area.  Baseline:  Goal status: ongoing 02/05/24  2.  Pelvic floor strength >/= 3/5 to reduce the urinary leakage during the night.  Baseline:  Goal status: ongoing 02/05/24  3.  Patient understands how to perform manual work to the area of Lichens Sclerosis to  keep the tissue mobility and reduce scaring.  Baseline:  Goal status: ongoing 02/05/24    PLAN:  PT FREQUENCY: 1-2x/week  PT DURATION: 12 weeks  PLANNED INTERVENTIONS: 97110-Therapeutic exercises, 97530- Therapeutic activity, 97112- Neuromuscular re-education, 97535- Self Care, [id number]- Manual therapy, Patient/Family education, and Biofeedback  PLAN FOR NEXT SESSION: see how the vaginal moisturizers are doing,  progress pelvic floor strength exercises   Channing Pereyra, PT 02/05/24 3:30 PM

## 2024-02-05 NOTE — Patient Instructions (Signed)
 Moisturizers They are used in the vagina to hydrate the mucous membrane that make up the vaginal canal. Designed to keep a more normal acid balance (ph) Place on the vulvar area and by the urethra Daily at night and during day Ingredients to avoid is glycerin and fragrance, can increase chance of infection .     Creams to use externally on the Vulva area Vulva Balm/ V-magic cream by medicine mama- amazon Julva-amazon Vital V Wild Yam salve ( help moisturize and help with thinning vulvar area, does have Beeswax MoodMaid Botanical Pro-Meno Wild Yam Cream- [employer] Desert Harvest Gele Cleo by Sherrlyn labial moisturizer (Amazon),  aloe Good Clean Love Enchanted Rose by intimate rose Momatoro kinder  Things to avoid in the vaginal area Do not use things to irritate the vulvar area No lotions just specialized creams for the vulva area- Neogyn, V-magic,  No soaps; can use Aveeno or Calendula cleanser, unscented Dove if needed. Must be gentle No deodorants No douches Good to sleep without underwear to let the vaginal area to air out No scrubbing: spread the lips to let warm water rinse over labias and pat dry   [facility] [address] [place], [place] [address] Phone # [phone] Fax [phone]

## 2024-02-07 ENCOUNTER — Other Ambulatory Visit: Payer: Self-pay | Admitting: *Deleted

## 2024-02-07 DIAGNOSIS — M810 Age-related osteoporosis without current pathological fracture: Secondary | ICD-10-CM

## 2024-02-07 MED ORDER — DENOSUMAB 60 MG/ML ~~LOC~~ SOSY
60.0000 mg | PREFILLED_SYRINGE | SUBCUTANEOUS | Status: AC
  Administered 2024-03-20: 60 mg via SUBCUTANEOUS

## 2024-02-12 ENCOUNTER — Ambulatory Visit: Payer: Self-pay

## 2024-02-12 DIAGNOSIS — F411 Generalized anxiety disorder: Secondary | ICD-10-CM | POA: Diagnosis not present

## 2024-02-12 DIAGNOSIS — F331 Major depressive disorder, recurrent, moderate: Secondary | ICD-10-CM | POA: Diagnosis not present

## 2024-02-12 NOTE — Telephone Encounter (Signed)
 Appointment tomorrrow 02/13/2024 at 3:30pm with Dr Clotilda Single    FYI Only or Action Required?: FYI only for provider.  Patient was last seen in primary care on 01/03/2024 by Ozell Heron HERO, MD.  Called Nurse Triage reporting Abdominal Pain.  Symptoms began 2 days ago.  Interventions attempted: OTC medications: Advil , Rest, hydration, or home remedies, and Dietary changes.  Symptoms are: unchanged.  Triage Disposition: See Physician Within 24 Hours  Patient/caregiver understands and will follow disposition?: Yes             Copied from CRM #[id number]. Topic: Clinical - Red Word Triage >> Feb 12, 2024 10:20 AM Robinson H wrote: [facility] that prompted transfer to Nurse Triage: Lower abdominal pain again, diarrhea, chills and night sweats Reason for Disposition  [1] MODERATE pain (e.g., interferes with normal activities) AND [2] pain comes and goes (cramps) AND [3] present > 24 hours  (Exception: Pain with Vomiting or Diarrhea - see that Guideline.)  Answer Assessment - Initial Assessment Questions Seen 01/03/2024 by Dr Heron Ozell and was advised if another episode occurs, further imaging will be needed. Another episode this week about two days ago Lower abdominal pain After eating, has to go to the bathroom within 30 minutes or so Finished antibiotics Patient has taken Advil  for the pain   Patient is advised that if anything worsens to go to the Emergency Room. Patient verbalized understanding.    1. LOCATION: Where does it hurt?      Lower abd 2. RADIATION: Does the pain shoot anywhere else? (e.g., chest, back)     -------- 3. ONSET: When did the pain begin? (e.g., minutes, hours or days ago)      About two days ago 4. SUDDEN: Gradual or sudden onset?     -------- 5. PATTERN Does the pain come and go, or is it constant?     Comes and goes 6. SEVERITY: How bad is the pain?  (e.g., Scale 1-10; mild, moderate, or severe)     6 today 7.  RECURRENT SYMPTOM: Have you ever had this type of stomach pain before? If Yes, ask: When was the last time? and What happened that time?      diverticulitis 8. CAUSE: What do you think is causing the stomach pain? (e.g., gallstones, recent abdominal surgery)     Possibly diverticulitis unsure exactly 9. RELIEVING/AGGRAVATING FACTORS: What makes it better or worse? (e.g., antacids, bending or twisting motion, bowel movement)     Not sure 10. OTHER SYMPTOMS: Do you have any other symptoms? (e.g., back pain, diarrhea, fever, urination pain, vomiting)       Queasy  Protocols used: Abdominal Pain - Female-A-AH

## 2024-02-13 ENCOUNTER — Ambulatory Visit: Admitting: Family Medicine

## 2024-02-13 ENCOUNTER — Encounter: Payer: Self-pay | Admitting: Family Medicine

## 2024-02-13 VITALS — BP 110/62 | HR 88 | Temp 99.3°F | Ht 64.5 in | Wt 176.4 lb

## 2024-02-13 DIAGNOSIS — R631 Polydipsia: Secondary | ICD-10-CM

## 2024-02-13 DIAGNOSIS — R110 Nausea: Secondary | ICD-10-CM | POA: Diagnosis not present

## 2024-02-13 DIAGNOSIS — R1032 Left lower quadrant pain: Secondary | ICD-10-CM | POA: Diagnosis not present

## 2024-02-13 DIAGNOSIS — R1011 Right upper quadrant pain: Secondary | ICD-10-CM | POA: Diagnosis not present

## 2024-02-13 DIAGNOSIS — Z8719 Personal history of other diseases of the digestive system: Secondary | ICD-10-CM

## 2024-02-13 LAB — POCT URINALYSIS DIPSTICK
Bilirubin, UA: POSITIVE
Blood, UA: NEGATIVE
Glucose, UA: NEGATIVE
Ketones, UA: POSITIVE
Leukocytes, UA: NEGATIVE
Nitrite, UA: NEGATIVE
Protein, UA: POSITIVE — AB
Spec Grav, UA: 1.02 (ref 1.010–1.025)
Urobilinogen, UA: 0.2 U/dL
pH, UA: 6 (ref 5.0–8.0)

## 2024-02-13 LAB — POC COVID19 BINAXNOW: SARS Coronavirus 2 Ag: NEGATIVE

## 2024-02-13 MED ORDER — AMOXICILLIN-POT CLAVULANATE 875-125 MG PO TABS: 1.0000 | ORAL_TABLET | Freq: Three times a day (TID) | ORAL | 0 refills | Status: DC

## 2024-02-13 NOTE — Progress Notes (Signed)
 Established Patient Office Visit   Subjective  Patient ID: [name] [name], female    DOB: 12/08/1955  Age: 68 y.o. MRN: [id number]  Chief Complaint  Patient presents with   Acute Visit    Patient has come in today for abdominal pain, which started 3 days ago, coughing,     Pt is a 68 yo female followed by Dr. Charlett seen for acute concern.  Pt with lower abd pain x 3 days.  Also notes pressure sensation when sitting, increased thirst, nausea, decreased appetite, intermittent constipation and loose stools.  Pt had 2 similar episodes this yr. where she was treated for diverticulitis.  Initially dx'd in 2015, but has never had this many episodes.  Had a colonoscopy in Jan 2025.  Finds it difficult to get timely appointments with her gastroenterologist. She has a gynecologist appointment scheduled for October 15.  Previously treated with ciprofloxacin  and metronidazole , which caused diarrhea for a week and a half followed by constipation. Drinking a lot of water without frequent urination. Denies dysuria.   Pt reports a history of bright red blood with bowel movements about one to two weeks ago, attributed to a fissure.      Patient Active Problem List   Diagnosis Date Noted   Acute medial meniscus tear of left knee 07/30/2022   Elevated alkaline phosphatase measurement 09/26/2014   Renal cyst, left 12/31/2013   Osteopenia 12/31/2013   Vitamin D  deficiency 12/31/2013   Postmenopausal atrophic vaginitis 12/31/2013   Family history of colon cancer 10/02/2013   Wrist pain, right 05/13/2013   Hx of fall 05/13/2013   Hypercholesteremia 05/13/2013   Musculoskeletal leg pain 02/02/2013   Gait abnormality 02/02/2013   Chest discomfort 05/28/2011   Renal cyst 12/01/2010   Cough 09/27/2010   Neck pain 08/29/2010   Allergic rhinitis, seasonal 08/29/2010   Varicose veins of bilateral lower extremities with other complications 12/06/2008   Brachial neuritis or radiculitis 10/29/2008    HYPERLIPIDEMIA NEC/NOS 01/10/2007   DEPRESSION 01/08/2007   GERD 01/08/2007   Diverticulosis of colon 07/28/2002   Past Medical History:  Diagnosis Date   Arthritis    Bunion    left foot   Cataract 08/2022   Depression    stable on medication   Diverticulitis 2015   Diverticulitis of colon without hemorrhage 09/28/2013   Diverticulosis    Gallstones    GERD (gastroesophageal reflux disease)    Hammer toes of both feet    Hyperlipidemia    no meds needed   IBS (irritable bowel syndrome)    Lichen sclerosus    Meniscal injury    Tear   Onychomycosis of toenail    Osteopenia    Osteoporosis    PUD (peptic ulcer disease) 1980's   Renal cyst    4.5-6 cm stable single  seen by uro in past   Past Surgical History:  Procedure Laterality Date   CHOLECYSTECTOMY  1987   COLONOSCOPY     IRRIGATION AND DEBRIDEMENT SEBACEOUS CYST Right 3/ 2014   chest wall   KNEE ARTHROSCOPY WITH MENISCAL REPAIR Left 07/30/2022   Procedure: LEFT KNEE ARTHROSCOPY WITH MEDIAL MENISCAL ROOT REPAIR;  Surgeon: Genelle Standing, MD;  Location: [facility];  Service: Orthopedics;  Laterality: Left;   UPPER GASTROINTESTINAL ENDOSCOPY     WISDOM TOOTH EXTRACTION  age 14 & 67   lower teeth done first and upper wisdom teeth last   Social History   Tobacco Use   Smoking status:  Never   Smokeless tobacco: Never  Vaping Use   Vaping status: Never Used  Substance Use Topics   Alcohol use: No   Drug use: No    Comment: >16, >5   Family History  Problem Relation Age of Onset   Osteoarthritis Mother    COPD Mother    Pneumonia Mother    Osteoporosis Mother    Arthritis Mother    Varicose Veins Mother    Rheum arthritis Father    Pneumonia Father        deceased   Colon cancer Father 30   Heart attack Father    Arthritis Father    Cancer Father    Heart disease Father    Varicose Veins Father    Breast cancer Sister 40   Colon polyps Sister    Hearing loss Sister    Cancer  Maternal Grandmother        ? GI   Heart failure Maternal Grandfather    Breast cancer Paternal Grandmother 29   Heart failure Paternal Grandfather    Depression Paternal Grandfather    Heart disease Paternal Grandfather    Esophageal cancer Neg Hx    Rectal cancer Neg Hx    Stomach cancer Neg Hx    Allergies  Allergen Reactions   Fosamax  [Alendronate  Sodium] Other (See Comments)    Intense pain   Tylenol  [Acetaminophen ] Other (See Comments)    Irritates my esophagus    ROS Negative unless stated above    Objective:     BP 110/62 (BP Location: Right Arm, Patient Position: Sitting, Cuff Size: Large)   Pulse 88   Temp 99.3 F (37.4 C) (Oral)   Ht 5' 4.5 (1.638 m)   Wt 176 lb 6.4 oz (80 kg)   LMP 11/18/2004   SpO2 94%   BMI 29.81 kg/m  BP Readings from Last 3 Encounters:  02/13/24 110/62  01/08/24 134/63  01/03/24 (!) 104/6   Wt Readings from Last 3 Encounters:  02/13/24 176 lb 6.4 oz (80 kg)  01/03/24 178 lb 1.6 oz (80.8 kg)  10/16/23 181 lb (82.1 kg)      Physical Exam Constitutional:      Appearance: Normal appearance.  HENT:     Head: Normocephalic and atraumatic.     Nose: Nose normal.     Mouth/Throat:     Mouth: Mucous membranes are moist.  Cardiovascular:     Rate and Rhythm: Normal rate.  Pulmonary:     Effort: Pulmonary effort is normal.  Abdominal:     General: Bowel sounds are decreased.     Palpations: Abdomen is soft.     Tenderness: There is abdominal tenderness in the right upper quadrant and left lower quadrant. There is no guarding or rebound.  Musculoskeletal:        General: Normal range of motion.  Skin:    General: Skin is warm and dry.  Neurological:     Mental Status: She is alert and oriented to person, place, and time.     Results for orders placed or performed in visit on 02/13/24  POC COVID-19 BinaxNow  Result Value Ref Range   SARS Coronavirus 2 Ag Negative Negative  POCT urinalysis dipstick  Result Value Ref  Range   Color, UA yellow    Clarity, UA clear    Glucose, UA Negative Negative   Bilirubin, UA pos    Ketones, UA pos    Spec Grav, UA 1.020 1.010 - 1.025  Blood, UA neg    pH, UA 6.0 5.0 - 8.0   Protein, UA Positive (A) Negative   Urobilinogen, UA 0.2 0.2 or 1.0 E.U./dL   Nitrite, UA neg    Leukocytes, UA Negative Negative   Appearance     Odor        Assessment & Plan:   RUQ abdominal pain -     CBC with Differential/Platelet; Future -     POCT urinalysis dipstick -     Comprehensive metabolic panel with GFR; Future -     TSH; Future -     T4, free; Future -     Lipase -     CT ABDOMEN PELVIS WO CONTRAST; Future -     Amoxicillin -Pot Clavulanate; Take 1 tablet by mouth in the morning, at noon, and at bedtime.  Dispense: 20 tablet; Refill: 0  LLQ abdominal pain -     CBC with Differential/Platelet; Future -     POCT urinalysis dipstick -     Comprehensive metabolic panel with GFR; Future -     TSH; Future -     T4, free; Future -     CT ABDOMEN PELVIS WO CONTRAST; Future -     Amoxicillin -Pot Clavulanate; Take 1 tablet by mouth in the morning, at noon, and at bedtime.  Dispense: 20 tablet; Refill: 0  Nausea -     POC COVID-19 BinaxNow -     Comprehensive metabolic panel with GFR; Future -     Hemoglobin A1c; Future -     T4, free; Future -     Lipase -     CT ABDOMEN PELVIS WO CONTRAST; Future  Increased thirst -     Comprehensive metabolic panel with GFR; Future -     Hemoglobin A1c; Future  History of diverticulitis -     CT ABDOMEN PELVIS WO CONTRAST; Future -     Amoxicillin -Pot Clavulanate; Take 1 tablet by mouth in the morning, at noon, and at bedtime.  Dispense: 20 tablet; Refill: 0   Recurrent abd pain, constipation, loose stools.  Concern for diverticulitis given hx.  Also consider UTI, viral etiology, GIB, etc.  Pt mentioned cough during rooming, POC COVID testing negative.  Obtain labs and imaging.  Start abx.  F/u with pcp and GI.  Return if  symptoms worsen or fail to improve with pcp.   Clotilda JONELLE Single, MD

## 2024-02-14 ENCOUNTER — Ambulatory Visit: Payer: Self-pay | Admitting: Family Medicine

## 2024-02-14 ENCOUNTER — Encounter: Payer: Self-pay | Admitting: Family Medicine

## 2024-02-14 LAB — CBC WITH DIFFERENTIAL/PLATELET
Basophils Absolute: 0.2 K/uL — ABNORMAL HIGH (ref 0.0–0.1)
Basophils Relative: 1.9 % (ref 0.0–3.0)
Eosinophils Absolute: 0 K/uL (ref 0.0–0.7)
Eosinophils Relative: 0.5 % (ref 0.0–5.0)
HCT: 37.1 % (ref 36.0–46.0)
Hemoglobin: 12.3 g/dL (ref 12.0–15.0)
Lymphocytes Relative: 11.5 % — ABNORMAL LOW (ref 12.0–46.0)
Lymphs Abs: 1.2 K/uL (ref 0.7–4.0)
MCHC: 33.1 g/dL (ref 30.0–36.0)
MCV: 88.9 fl (ref 78.0–100.0)
Monocytes Absolute: 1.2 K/uL — ABNORMAL HIGH (ref 0.1–1.0)
Monocytes Relative: 10.9 % (ref 3.0–12.0)
Neutro Abs: 8 K/uL — ABNORMAL HIGH (ref 1.4–7.7)
Neutrophils Relative %: 75.2 % (ref 43.0–77.0)
Platelets: 231 K/uL (ref 150.0–400.0)
RBC: 4.17 Mil/uL (ref 3.87–5.11)
RDW: 13.9 % (ref 11.5–15.5)
WBC: 10.6 K/uL — ABNORMAL HIGH (ref 4.0–10.5)

## 2024-02-14 LAB — COMPREHENSIVE METABOLIC PANEL WITH GFR
ALT: 12 U/L (ref 0–35)
AST: 20 U/L (ref 0–37)
Albumin: 4 g/dL (ref 3.5–5.2)
Alkaline Phosphatase: 73 U/L (ref 39–117)
BUN: 14 mg/dL (ref 6–23)
CO2: 28 meq/L (ref 19–32)
Calcium: 9 mg/dL (ref 8.4–10.5)
Chloride: 100 meq/L (ref 96–112)
Creatinine, Ser: 0.7 mg/dL (ref 0.40–1.20)
GFR: 88.44 mL/min (ref >60.00)
Glucose, Bld: 99 mg/dL (ref 70–99)
Potassium: 4.4 meq/L (ref 3.5–5.1)
Sodium: 135 meq/L (ref 135–145)
Total Bilirubin: 0.6 mg/dL (ref 0.2–1.2)
Total Protein: 7.3 g/dL (ref 6.0–8.3)

## 2024-02-14 LAB — TSH: TSH: 1.68 u[IU]/mL (ref 0.35–5.50)

## 2024-02-14 LAB — HEMOGLOBIN A1C: Hgb A1c MFr Bld: 6.1 % (ref 4.6–6.5)

## 2024-02-14 LAB — T4, FREE: Free T4: 0.9 ng/dL (ref 0.60–1.60)

## 2024-02-14 LAB — LIPASE: Lipase: 20 U/L (ref 11.0–59.0)

## 2024-02-15 ENCOUNTER — Other Ambulatory Visit: Payer: Self-pay | Admitting: Internal Medicine

## 2024-02-19 DIAGNOSIS — F3341 Major depressive disorder, recurrent, in partial remission: Secondary | ICD-10-CM | POA: Diagnosis not present

## 2024-02-20 ENCOUNTER — Ambulatory Visit
Admission: RE | Admit: 2024-02-20 | Discharge: 2024-02-20 | Disposition: A | Discharge status: Home / Self Care | Source: Ambulatory Visit | Attending: Family Medicine | Admitting: Family Medicine

## 2024-02-20 DIAGNOSIS — R1011 Right upper quadrant pain: Secondary | ICD-10-CM

## 2024-02-20 DIAGNOSIS — Z8719 Personal history of other diseases of the digestive system: Secondary | ICD-10-CM

## 2024-02-20 DIAGNOSIS — K5730 Diverticulosis of large intestine without perforation or abscess without bleeding: Secondary | ICD-10-CM | POA: Diagnosis not present

## 2024-02-20 DIAGNOSIS — R1032 Left lower quadrant pain: Secondary | ICD-10-CM

## 2024-02-20 DIAGNOSIS — R110 Nausea: Secondary | ICD-10-CM

## 2024-02-24 ENCOUNTER — Encounter: Payer: Self-pay | Admitting: Physician Assistant

## 2024-02-27 ENCOUNTER — Ambulatory Visit

## 2024-02-27 ENCOUNTER — Ambulatory Visit: Admitting: Podiatry

## 2024-02-27 ENCOUNTER — Encounter: Payer: Self-pay | Admitting: Podiatry

## 2024-02-27 ENCOUNTER — Telehealth: Payer: Self-pay | Admitting: *Deleted

## 2024-02-27 DIAGNOSIS — M7750 Other enthesopathy of unspecified foot: Secondary | ICD-10-CM

## 2024-02-27 DIAGNOSIS — M7732 Calcaneal spur, left foot: Secondary | ICD-10-CM | POA: Diagnosis not present

## 2024-02-27 DIAGNOSIS — M205X2 Other deformities of toe(s) (acquired), left foot: Secondary | ICD-10-CM | POA: Diagnosis not present

## 2024-02-27 DIAGNOSIS — M79672 Pain in left foot: Secondary | ICD-10-CM | POA: Diagnosis not present

## 2024-02-27 DIAGNOSIS — M19072 Primary osteoarthritis, left ankle and foot: Secondary | ICD-10-CM | POA: Diagnosis not present

## 2024-02-27 MED ORDER — MELOXICAM 15 MG PO TABS: 15.0000 mg | ORAL_TABLET | Freq: Every day | ORAL | 0 refills | Status: AC

## 2024-02-27 NOTE — Progress Notes (Signed)
  Subjective:  Patient ID: [name] [name], female    DOB: [birth year],   MRN: [id number]  Chief Complaint  Patient presents with   Foot Pain    I'm having trouble with my left foot.  It started out with the big toe but now it hurts right below my toes.    68 y.o. female presents for concern of left great toe pain that has been going on for a couple weeks. Relates a long history of stiffness and bump on the toe but recently started to hurt in certain shoes. She denies any treatments  . Denies any other pedal complaints. Denies n/v/f/c.   Past Medical History:  Diagnosis Date   Arthritis    Bunion    left foot   Cataract 08/2022   Depression    stable on medication   Diverticulitis 2015   Diverticulitis of colon without hemorrhage 09/28/2013   Diverticulosis    Gallstones    GERD (gastroesophageal reflux disease)    Hammer toes of both feet    Hyperlipidemia    no meds needed   IBS (irritable bowel syndrome)    Lichen sclerosus    Meniscal injury    Tear   Onychomycosis of toenail    Osteopenia    Osteoporosis    PUD (peptic ulcer disease) 1980's   Renal cyst    4.5-6 cm stable single  seen by uro in past    Objective:  Physical Exam: Vascular: DP/PT pulses 2/4 bilateral. CFT <3 seconds. Normal hair growth on digits. No edema.  Skin. No lacerations or abrasions bilateral feet.  Musculoskeletal: MMT 5/5 bilateral lower extremities in DF, PF, Inversion and Eversion. Deceased ROM in DF of ankle joint. Left hallux first MPJ tender to palpation dorsally and with ROM . Limited ROM of the joint with prominent spurring noted.  Neurological: Sensation intact to light touch.   Assessment:   1. Hallux limitus, left      Plan:  Patient was evaluated and treated and all questions answered. -Xrays reviewed. No acute fractures or dislocations. Significant degneerative changes noted to first MP.  -Discussed hallux limitus and  treatement options; conservative and  Surgical  management; risks, benefits, alternatives discussed. All patient's questions answered. -Rx Meloxicam provided. -Deferred injection today.  -Recommend continue with good supportive shoes and inserts. Discussed stiff soled shoes and use of carbon fiber foot plate.   -Patient to return to office as needed or sooner if condition worsens.   Asberry Failing, DPM

## 2024-02-27 NOTE — Telephone Encounter (Signed)
 I left the patient a message asking her to come in 20 prior to her appointment because we need to get xrays of her foot.  I asked her to stop by imaging first.

## 2024-03-02 ENCOUNTER — Encounter: Payer: Self-pay | Admitting: Gastroenterology

## 2024-03-04 ENCOUNTER — Encounter: Payer: Self-pay | Admitting: Obstetrics and Gynecology

## 2024-03-04 ENCOUNTER — Ambulatory Visit: Admitting: Obstetrics and Gynecology

## 2024-03-04 VITALS — BP 124/60 | HR 82 | Wt 174.6 lb

## 2024-03-04 DIAGNOSIS — L280 Lichen simplex chronicus: Secondary | ICD-10-CM | POA: Diagnosis not present

## 2024-03-04 NOTE — Progress Notes (Signed)
   Acute Office Visit  Subjective:    Patient ID: [name] [name], female    DOB: [birth year], 68 y.o.   MRN: [id number]   HPI 68 y.o. presents today for medication  (PAP-10/05/22/Mammo-08/09/23/Dexa-09/25/22/Colonoscopy-07/05/23/[facility] Medicare Not HR) .Patient with CT down and incidental 15mm ovarian cyst was seen. No pain over the area  Seen by dermatology for LS treatment. States clobetasol  has helped the most and when flares using it twice daily. Using estrogen cream two times a week. Patient would like a referral to [facility] for vulvar skin conditions Dr. Ricka MALVA Hammed  Patient's last menstrual period was 11/18/2004.    Review of Systems     Objective:    OBGyn Exam  BP 124/60   Pulse 82   Wt 174 lb 9.6 oz (79.2 kg)   LMP 11/18/2004   SpO2 98%   BMI 29.51 kg/m  Wt Readings from Last 3 Encounters:  03/04/24 174 lb 9.6 oz (79.2 kg)  02/13/24 176 lb 6.4 oz (80 kg)  01/03/24 178 lb 1.6 oz (80.8 kg)        Assessment & Plan:  LS Referral placed. To use clobetasol  with flares only and with estrogen daily. With no flares use estrogen 3-4 times a week a dime size amount to rub into the tissue. PUS only visit to evaluate cyst 20 minutes spent on reviewing records, imaging,  and one on one patient time and counseling patient and documentation Dr. Glennon Almarie MARLA Glennon

## 2024-03-05 ENCOUNTER — Ambulatory Visit: Admitting: Obstetrics and Gynecology

## 2024-03-05 ENCOUNTER — Encounter: Payer: Self-pay | Admitting: Obstetrics and Gynecology

## 2024-03-05 VITALS — BP 123/77 | HR 77

## 2024-03-05 DIAGNOSIS — M6289 Other specified disorders of muscle: Secondary | ICD-10-CM | POA: Diagnosis not present

## 2024-03-05 DIAGNOSIS — N952 Postmenopausal atrophic vaginitis: Secondary | ICD-10-CM

## 2024-03-05 DIAGNOSIS — R351 Nocturia: Secondary | ICD-10-CM | POA: Diagnosis not present

## 2024-03-05 NOTE — Progress Notes (Signed)
 [facility] Return Visit  SUBJECTIVE  History of Present Illness: [name] [name] is a 68 y.o. female seen in follow-up for nocturia. Plan at last visit was continue with PT.    Patient was doing well with PT, but then suffered flares of diverticulitis and had to pause.   She reports she is doing well overall and is only getting up once at night to urinate.    Past Medical History: Patient  has a past medical history of Arthritis, Bunion, Cataract (08/2022), Depression, Diverticulitis (2015), Diverticulitis of colon without hemorrhage (09/28/2013), Diverticulosis, Gallstones, GERD (gastroesophageal reflux disease), Hammer toes of both feet, Hyperlipidemia, IBS (irritable bowel syndrome), Lichen sclerosus, Meniscal injury, Onychomycosis of toenail, Osteopenia, Osteoporosis, PUD (peptic ulcer disease) (1980's), and Renal cyst.   Past Surgical History: She  has a past surgical history that includes Cholecystectomy (1987); Wisdom tooth extraction (age 24 & 73); Irrigation and debridement sabaceous cyst (Right, 3/ 2014); Colonoscopy; Upper gastrointestinal endoscopy; and Knee arthroscopy with meniscal repair (Left, 07/30/2022).   Medications: She has a current medication list which includes the following prescription(s): calcium carb-cholecalciferol, clobetasol  ointment, denosumab , dextromethorphan-guaifenesin , estradiol , fluoxetine, fluoxetine, fluticasone , meloxicam, multiple vitamins-minerals, omeprazole , pimecrolimus , probiotic product, and triamcinolone  ointment, and the following Facility-Administered Medications: denosumab .   Allergies: Patient is allergic to fosamax  [alendronate  sodium] and tylenol  [acetaminophen ].   Social History: Patient  reports that she has never smoked. She has never used smokeless tobacco. She reports that she does not drink alcohol and does not use drugs.     OBJECTIVE     Physical Exam: Vitals:   03/05/24 1304  BP: 123/77  Pulse: 77    Gen: No apparent distress, A&O x 3.  Detailed Urogynecologic Evaluation:  Deferred.   ASSESSMENT AND PLAN    [name] [name] is a 68 y.o. with:  1. Nocturia   2. Vaginal atrophy   3. Pelvic floor dysfunction     Patient reports improvement in nocturia with pelvic floor PT and defers formal medication at this time. She reports she is only getting up once at night with mild urgency at first normal waking urination.  Patient to continue using estrogen cream x2-3 weekly.  Improving with pelvic floor PT.   Patient to follow up in 6 months if needed.     Macoy Rodwell G Danessa Mensch, NP

## 2024-03-09 ENCOUNTER — Ambulatory Visit: Attending: Obstetrics and Gynecology | Admitting: Physical Therapy

## 2024-03-09 ENCOUNTER — Encounter: Payer: Self-pay | Admitting: Physical Therapy

## 2024-03-09 DIAGNOSIS — R278 Other lack of coordination: Secondary | ICD-10-CM | POA: Diagnosis not present

## 2024-03-09 DIAGNOSIS — M6281 Muscle weakness (generalized): Secondary | ICD-10-CM | POA: Insufficient documentation

## 2024-03-09 NOTE — Therapy (Signed)
 OUTPATIENT PHYSICAL THERAPY FEMALE PELVIC TREATMENT   Patient Name: [name] [name] MRN: [id number] DOB:1955/07/20, 68 y.o., female Today's Date: 03/09/2024  END OF SESSION:  PT End of Session - 03/09/24 1528     Visit Number 4    Date for Recertification  04/15/24    Authorization Type humana    Authorization Time Period 02/05/2024-05/05/2024    Authorization - Visit Number 1    Authorization - Number of Visits 8    Progress Note Due on Visit 10    PT Start Time 1530    PT Stop Time 1610    PT Time Calculation (min) 40 min    Activity Tolerance Patient tolerated treatment well    Behavior During Therapy [facility] for tasks assessed/performed          Past Medical History:  Diagnosis Date   Arthritis    Bunion    left foot   Cataract 08/2022   Depression    stable on medication   Diverticulitis 2015   Diverticulitis of colon without hemorrhage 09/28/2013   Diverticulosis    Gallstones    GERD (gastroesophageal reflux disease)    Hammer toes of both feet    Hyperlipidemia    no meds needed   IBS (irritable bowel syndrome)    Lichen sclerosus    Meniscal injury    Tear   Onychomycosis of toenail    Osteopenia    Osteoporosis    PUD (peptic ulcer disease) 1980's   Renal cyst    4.5-6 cm stable single  seen by uro in past   Past Surgical History:  Procedure Laterality Date   CHOLECYSTECTOMY  1987   COLONOSCOPY     IRRIGATION AND DEBRIDEMENT SEBACEOUS CYST Right 3/ 2014   chest wall   KNEE ARTHROSCOPY WITH MENISCAL REPAIR Left 07/30/2022   Procedure: LEFT KNEE ARTHROSCOPY WITH MEDIAL MENISCAL ROOT REPAIR;  Surgeon: Genelle Standing, MD;  Location: [facility];  Service: Orthopedics;  Laterality: Left;   UPPER GASTROINTESTINAL ENDOSCOPY     WISDOM TOOTH EXTRACTION  age 16 & 68   lower teeth done first and upper wisdom teeth last   Patient Active Problem List   Diagnosis Date Noted   Acute medial meniscus tear of left knee 07/30/2022   Elevated  alkaline phosphatase measurement 09/26/2014   Renal cyst, left 12/31/2013   Osteopenia 12/31/2013   Vitamin D  deficiency 12/31/2013   Postmenopausal atrophic vaginitis 12/31/2013   Family history of colon cancer 10/02/2013   Wrist pain, right 05/13/2013   Hx of fall 05/13/2013   Hypercholesteremia 05/13/2013   Musculoskeletal leg pain 02/02/2013   Gait abnormality 02/02/2013   Chest discomfort 05/28/2011   Renal cyst 12/01/2010   Cough 09/27/2010   Neck pain 08/29/2010   Allergic rhinitis, seasonal 08/29/2010   Varicose veins of bilateral lower extremities with other complications 12/06/2008   Brachial neuritis or radiculitis 10/29/2008   HYPERLIPIDEMIA NEC/NOS 01/10/2007   DEPRESSION 01/08/2007   GERD 01/08/2007   Diverticulosis of colon 07/28/2002    PCP: Charlett Apolinar POUR, MD  REFERRING PROVIDER: Zuleta, Kaitlin G, NP   REFERRING DIAG:  R35.1 (ICD-10-CM) - Nocturia  M62.89 (ICD-10-CM) - Pelvic floor dysfunction    THERAPY DIAG:  Muscle weakness (generalized)  Other lack of coordination  Rationale for Evaluation and Treatment: Rehabilitation  ONSET DATE: 12/24  SUBJECTIVE:  SUBJECTIVE STATEMENT: No leakage.   Fluid intake: Drinks: 2 cups coffee AM, 1 cup coffee PM, 16oz Unsweetened ice tea with dinner, diet coke, water, stops at 10pm, sleeps around 12am per day   PAIN:  Are you having pain? No  PRECAUTIONS: None  RED FLAGS: None   WEIGHT BEARING RESTRICTIONS: No  FALLS:  Has patient fallen in last 6 months? No  OCCUPATION: volunteer work at [employer]   ACTIVITY LEVEL : walking  PLOF: Independent  PATIENT GOALS: not wake up as much during the night, strengthen the muscles  PERTINENT HISTORY:  Lichens  Sclerosus; IBS; Osteoporosis; Cholecystectomy;  Sexual  abuse: No  BOWEL MOVEMENT: no issues   URINATION: Pain with urination: No Fully empty bladder:Yes,  When urinating, patient feels dribbling after finishing  12/30/23: no dribbling after urination Stream: Strong Urgency: No Frequency: Day time voids 6-8.  Nocturia: 1-2 times per night to void. AS soon as she wakes up she will have to urinate.  Leakage: while asleep Pads: No  INTERCOURSE: not active   PREGNANCY:None   PROLAPSE: None   OBJECTIVE:  Note: Objective measures were completed at Evaluation unless otherwise noted.  DIAGNOSTIC FINDINGS:  Pelvic floor strength II/V   Pelvic floor musculature: Right levator non-tender, Right obturator tender, Left levator non-tender, Left obturator non-tender Post Void Residual 2 mL    PATIENT SURVEYS:  PFIQ-7: 6 UIQ-7: 10 02/05/24: PFIQ-7: 6 UIQ-7: 10 03/09/24: PFIQ-7: 0 UIQ-7: 0  COGNITION: Overall cognitive status: Within functional limits for tasks assessed     SENSATION: Light touch: Appears intact  LUMBAR SPECIAL TESTS:  Trendelenburg sign: Positive both sides  LUMBARAROM/PROM: lumbar ROM is full   LOWER EXTREMITY ROM:  Passive ROM Right eval Left eval Right 02/05/24 Left 02/05/24  Hip internal rotation 10 10 10 10   Hip external rotation 40 50 55 50   (Blank rows = not tested)  LOWER EXTREMITY MMT:  MMT Right eval Left eval Right/Left 02/05/24  Hip extension 4/5 4/5 4/5  Hip abduction 4/5 4/5 5/5  Hip adduction 4/5 4/5 5/5   (Blank rows = not tested) PALPATION:    External Perineal Exam: Lichens sclerosis located on the perineum and rectal area, decreased mobility of the perineal body                             Internal Pelvic Floor: tightness along the sides of the introitus and levator ani  Patient confirms identification and approves PT to assess internal pelvic floor and treatment Yes No emotional/communication barriers or cognitive limitation. Patient is motivated to learn. Patient  understands and agrees with treatment goals and plan. PT explains patient will be examined in standing, sitting, and lying down to see how their muscles and joints work. When they are ready, they will be asked to remove their underwear so PT can examine their perineum. The patient is also given the option of providing their own chaperone as one is not provided in our facility. The patient also has the right and is explained the right to defer or refuse any part of the evaluation or treatment including the internal exam. With the patient's consent, PT will use one gloved finger to gently assess the muscles of the pelvic floor, seeing how well it contracts and relaxes and if there is muscle symmetry. After, the patient will get dressed and PT and patient will discuss exam findings and plan of care. PT and patient discuss plan  of care, schedule, attendance policy and HEP activities.   PELVIC MMT:   MMT eval 02/05/24  Vaginal 2/5 3/5  (Blank rows = not tested)        TONE: average  PROLAPSE: none  TODAY'S TREATMENT:   03/09/24 Exercises: Strengthening: Bridges 15 x with core and pelvic floor contracted Dead bug 20 x with pelvic floor and core contracted Quadruped lift opposite arm and leg 5 times but difficult keeping balance Leaning on counter lifting opposite arm and leg 20 x  Standing side stepping with band around knee and pelvic floor contraction Standing hip flexion with band around knee and pelvic floor contraction      02/05/24 Manual: Internal pelvic floor techniques: No emotional/communication barriers or cognitive limitation. Patient is motivated to learn. Patient understands and agrees with treatment goals and plan. PT explains patient will be examined in standing, sitting, and lying down to see how their muscles and joints work. When they are ready, they will be asked to remove their underwear so PT can examine their perineum. The patient is also given the option of providing  their own chaperone as one is not provided in our facility. The patient also has the right and is explained the right to defer or refuse any part of the evaluation or treatment including the internal exam. With the patient's consent, PT will use one gloved finger to gently assess the muscles of the pelvic floor, seeing how well it contracts and relaxes and if there is muscle symmetry. After, the patient will get dressed and PT and patient will discuss exam findings and plan of care. PT and patient discuss plan of care, schedule, attendance policy and HEP activities.  Therapist gloved finger in the vaginal canal working on the levator ani, obturator internist, perineal body and posterior vaginal canal Neuromuscular re-education: Core facilitation: Transverse abdominus contraction with breathing out 10 x Supine abdominal contraction with lifting opposite arm and leg 20 x  Pelvic floor contraction training: Therapist gloved finger in the vaginal canal working on pelvic floor contraction giving verbal and tactile cues Therapist will clear her throat and push down on pelvic floor and therapist giving tactile cues to contract with clearing her throat Self-care: Educated patient on vaginal moisturizers to improve the irritation of the vulvar area and improve the health of her tissue        12/30/23 Exercises: Strengthening: Bridge with ball squeeze and pelvic floor contraction 15 x Transverse abdominus contraction 10 x feeling the lower abdominal contract Supine lift opposite arm and leg with abdominal contraction 20 x  Self-care: Educated patient on vaginal health, how to apply the estrogen cream on the vulva, up the canal and along the urethra how to care for the vaginal tissue Education on bladder irritants and how they increase urgency, what is a bladder irritant      PATIENT EDUCATION:  03/09/24 Education details: Access Code: [email or web address];educated patient on vaginal moisturizers Person  educated: Patient Education method: Explanation, Demonstration, Tactile cues, Verbal cues, and Handouts Education comprehension: verbalized understanding, returned demonstration, verbal cues required, tactile cues required, and needs further education  HOME EXERCISE PROGRAM: 03/09/24 Access Code: [email or web address] URL: [email or web address] Date: 03/09/2024 Prepared by: Channing Pereyra  Exercises - Seated Pelvic Floor Contraction  - 3 x daily - 7 x weekly - 1 sets - 6 reps - 5 sec hold - Dead Bug  - 1 x daily - 3 x weekly - 2 sets - 10 reps - Supine Bridge with Pelvic Floor Contraction  -  1 x daily - 3 x weekly - 1 sets - 15 reps - Bird Dog on Counter  - 1 x daily - 3 x weekly - 1 sets - 15 reps - Side Stepping with Resistance at Thighs  - 1 x daily - 3 x weekly - 2 sets - 10 reps - Standing Marching  - 1 x daily - 3 x weekly - 1 sets - 30 reps  ASSESSMENT:  CLINICAL IMPRESSION: Patient is a 68 y.o. female who was seen today for physical therapy evaluation and treatment for nocturia and pelvic floor dysfunction. Patient is not leaking urine at night. She reports she is ready for discharge.  She has increased in pelvic floor strength to 3/5. She understands how to perform manual work to the [employer] area. She is independent with her HEP.   OBJECTIVE IMPAIRMENTS: decreased balance, decreased strength, and increased fascial restrictions.   ACTIVITY LIMITATIONS: sleeping and continence  PARTICIPATION LIMITATIONS: community activity  PERSONAL FACTORS: Age, Fitness, and Time since onset of injury/illness/exacerbation are also affecting patient's functional outcome.   REHAB POTENTIAL: Excellent  CLINICAL DECISION MAKING: Stable/uncomplicated  EVALUATION COMPLEXITY: Low   GOALS: Goals reviewed with patient? Yes  SHORT TERM GOALS: Target date: 11/27/23  Patient will be independent with initial HEP for pelvic floor strength to reduce urinary leakage.  Baseline: Goal  status: Met 12/30/23  2.  Patient educated on vaginal health to reduce irritation to the [facility] Sclerosis Baseline:  Goal status: Met 12/30/23  3.  Patient will be able to walk to the bathroom slowly when she wakes up first thing in the morning to reduce chance of falls.  Baseline:  Goal status: Met 12/30/23    LONG TERM GOALS: Target date: 04/15/24  Patient is independent with advanced HEP for pelvic floor and core strength to reduce urinary leakage during the night to reduce the irritation to the vaginal area.  Baseline:  Goal status: Met 03/09/24  2.  Pelvic floor strength >/= 3/5 to reduce the urinary leakage during the night.  Baseline:  Goal status: Met 03/09/24  3.  Patient understands how to perform manual work to the area of Lichens Sclerosis to  keep the tissue mobility and reduce scaring.  Baseline:  Goal status: met 03/09/24    PLAN: Discharge to HEP this visit    Channing Pereyra, PT 03/09/24 4:04 PM   PHYSICAL THERAPY DISCHARGE SUMMARY  Visits from Start of Care: 4  Current functional level related to goals / functional outcomes: See above.    Remaining deficits: See above.    Education / Equipment: HEP   Patient agrees to discharge. Patient goals were met. Patient is being discharged due to meeting the stated rehab goals. Thank you for the referral.   Channing Pereyra, PT 03/09/24 4:04 PM

## 2024-03-20 ENCOUNTER — Ambulatory Visit (INDEPENDENT_AMBULATORY_CARE_PROVIDER_SITE_OTHER)

## 2024-03-20 DIAGNOSIS — M810 Age-related osteoporosis without current pathological fracture: Secondary | ICD-10-CM | POA: Diagnosis not present

## 2024-03-20 NOTE — Progress Notes (Signed)
 Prolia  injection given in the right SQ arm. Pt tolerated the injection well.

## 2024-03-23 ENCOUNTER — Encounter: Payer: Self-pay | Admitting: Radiology

## 2024-03-25 ENCOUNTER — Other Ambulatory Visit (HOSPITAL_BASED_OUTPATIENT_CLINIC_OR_DEPARTMENT_OTHER): Payer: Self-pay

## 2024-03-25 MED ORDER — COMIRNATY 30 MCG/0.3ML IM SUSY
0.3000 mL | PREFILLED_SYRINGE | Freq: Once | INTRAMUSCULAR | 0 refills | Status: AC
  Filled 2024-03-25: qty 0.3, 1d supply, fill #0

## 2024-04-02 ENCOUNTER — Other Ambulatory Visit: Payer: Self-pay | Admitting: Obstetrics and Gynecology

## 2024-04-02 DIAGNOSIS — L280 Lichen simplex chronicus: Secondary | ICD-10-CM

## 2024-04-02 DIAGNOSIS — N949 Unspecified condition associated with female genital organs and menstrual cycle: Secondary | ICD-10-CM

## 2024-04-07 ENCOUNTER — Other Ambulatory Visit: Payer: Self-pay | Admitting: Obstetrics and Gynecology

## 2024-04-07 ENCOUNTER — Ambulatory Visit: Payer: Self-pay | Admitting: Obstetrics and Gynecology

## 2024-04-07 ENCOUNTER — Ambulatory Visit: Admitting: Obstetrics and Gynecology

## 2024-04-07 ENCOUNTER — Encounter: Payer: Self-pay | Admitting: Obstetrics and Gynecology

## 2024-04-07 ENCOUNTER — Ambulatory Visit (INDEPENDENT_AMBULATORY_CARE_PROVIDER_SITE_OTHER)

## 2024-04-07 VITALS — BP 124/76 | HR 68 | Wt 176.4 lb

## 2024-04-07 DIAGNOSIS — Z712 Person consulting for explanation of examination or test findings: Secondary | ICD-10-CM

## 2024-04-07 DIAGNOSIS — N949 Unspecified condition associated with female genital organs and menstrual cycle: Secondary | ICD-10-CM | POA: Diagnosis not present

## 2024-04-07 MED ORDER — HYDROCORTISONE ACETATE 25 MG RE SUPP: 25.0000 mg | Freq: Two times a day (BID) | RECTAL | 0 refills | Status: AC

## 2024-04-07 MED ORDER — TRIAMCINOLONE 0.1 % CREAM:EUCERIN CREAM 1:1: TOPICAL_CREAM | Freq: Two times a day (BID) | CUTANEOUS | Status: DC | PRN

## 2024-04-07 MED ORDER — TRIAMCINOLONE ACETONIDE 0.1 % EX OINT: 1.0000 | TOPICAL_OINTMENT | Freq: Every day | CUTANEOUS | 1 refills | Status: AC | PRN

## 2024-04-07 NOTE — Progress Notes (Signed)
   Acute Office Visit  Subjective:    Patient ID: [name] [name], female    DOB: [birth year], 68 y.o.   MRN: [id number]   HPI 68 y.o. presents today for Consult (US  consult //PAP-10/05/22/Mammo-08/09/23/Dexa-09/25/22 osteoporosis on prolia  repeat q 2 years/Colonoscopy-07/05/23) .Patient with CT down and incidental 15mm ovarian cyst was seen. No pain over the area She is here today for PUS to evaluate cyst.  Seen by dermatology for LS treatment. States clobetasol  has helped the most and when flares using it twice daily. Using estrogen cream two times a week. Patient has referral to [facility] for vulvar skin conditions Dr. Ricka MALVA Hammed, per her request  Patient's last menstrual period was 11/18/2004.    Review of Systems     Objective:    OBGyn Exam  BP 124/76   Pulse 68   Wt 176 lb 6.4 oz (80 kg)   LMP 11/18/2004   SpO2 98%   BMI 29.81 kg/m  Wt Readings from Last 3 Encounters:  04/07/24 176 lb 6.4 oz (80 kg)  03/04/24 174 lb 9.6 oz (79.2 kg)  02/13/24 176 lb 6.4 oz (80 kg)      PUS 5.17cm uterus Endometrial lining 3.74mm No masses or thickening seen RO not seen LO adnexa: 22x14x69mm simple avascular cyst (LO is not seen separate from it)  Assessment & Plan:  Counseled on PUS results and simple cyst seen. Discussed benign nature considering size and simple appearance and may be embryologic in origin.  Refills given Rtc for annual exams and with any concerns  20 minutes spent on reviewing records, imaging,  and one on one patient time and counseling patient and documentation Dr. Glennon Almarie MARLA [name]

## 2024-04-24 DIAGNOSIS — F3341 Major depressive disorder, recurrent, in partial remission: Secondary | ICD-10-CM | POA: Diagnosis not present

## 2024-06-11 ENCOUNTER — Other Ambulatory Visit: Payer: Self-pay | Admitting: Internal Medicine

## 2024-06-11 DIAGNOSIS — Z1231 Encounter for screening mammogram for malignant neoplasm of breast: Secondary | ICD-10-CM

## 2024-07-08 ENCOUNTER — Ambulatory Visit: Admitting: Physician Assistant

## 2024-07-24 ENCOUNTER — Ambulatory Visit

## 2024-08-25 ENCOUNTER — Encounter: Admitting: Internal Medicine

## 2024-08-25 ENCOUNTER — Ambulatory Visit
